# Patient Record
Sex: Female | Born: 1935 | Race: White | Hispanic: No | Marital: Married | Smoking: Never smoker
Health system: Southern US, Community
[De-identification: ages and names within clinical notes are randomized; demographics above are authoritative.]

## PROBLEM LIST (undated history)

## (undated) DIAGNOSIS — I2510 Atherosclerotic heart disease of native coronary artery without angina pectoris: Secondary | ICD-10-CM

## (undated) DIAGNOSIS — C499 Malignant neoplasm of connective and soft tissue, unspecified: Secondary | ICD-10-CM

## (undated) DIAGNOSIS — M549 Dorsalgia, unspecified: Secondary | ICD-10-CM

## (undated) DIAGNOSIS — I499 Cardiac arrhythmia, unspecified: Secondary | ICD-10-CM

## (undated) DIAGNOSIS — F419 Anxiety disorder, unspecified: Secondary | ICD-10-CM

## (undated) DIAGNOSIS — I351 Nonrheumatic aortic (valve) insufficiency: Secondary | ICD-10-CM

## (undated) DIAGNOSIS — M353 Polymyalgia rheumatica: Secondary | ICD-10-CM

## (undated) DIAGNOSIS — I671 Cerebral aneurysm, nonruptured: Secondary | ICD-10-CM

## (undated) DIAGNOSIS — M797 Fibromyalgia: Secondary | ICD-10-CM

## (undated) DIAGNOSIS — I5032 Chronic diastolic (congestive) heart failure: Secondary | ICD-10-CM

## (undated) DIAGNOSIS — F0390 Unspecified dementia without behavioral disturbance: Secondary | ICD-10-CM

## (undated) DIAGNOSIS — I480 Paroxysmal atrial fibrillation: Secondary | ICD-10-CM

## (undated) DIAGNOSIS — R42 Dizziness and giddiness: Secondary | ICD-10-CM

## (undated) DIAGNOSIS — S22000A Wedge compression fracture of unspecified thoracic vertebra, initial encounter for closed fracture: Secondary | ICD-10-CM

## (undated) DIAGNOSIS — I099 Rheumatic heart disease, unspecified: Secondary | ICD-10-CM

## (undated) DIAGNOSIS — G629 Polyneuropathy, unspecified: Secondary | ICD-10-CM

## (undated) DIAGNOSIS — I77810 Thoracic aortic ectasia: Secondary | ICD-10-CM

## (undated) DIAGNOSIS — E039 Hypothyroidism, unspecified: Secondary | ICD-10-CM

## (undated) DIAGNOSIS — R51 Headache: Secondary | ICD-10-CM

## (undated) HISTORY — PX: BLADDER SUSPENSION: SHX72

## (undated) HISTORY — DX: Polyneuropathy, unspecified: G62.9

## (undated) HISTORY — DX: Rheumatic heart disease, unspecified: I09.9

## (undated) HISTORY — PX: EYE SURGERY: SHX253

## (undated) HISTORY — DX: Cerebral aneurysm, nonruptured: I67.1

## (undated) HISTORY — DX: Hypothyroidism, unspecified: E03.9

## (undated) HISTORY — DX: Nonrheumatic aortic (valve) insufficiency: I35.1

## (undated) HISTORY — DX: Dizziness and giddiness: R42

## (undated) HISTORY — DX: Malignant neoplasm of connective and soft tissue, unspecified: C49.9

## (undated) HISTORY — DX: Thoracic aortic ectasia: I77.810

## (undated) HISTORY — PX: ABDOMINAL HYSTERECTOMY: SHX81

---

## 2000-07-26 ENCOUNTER — Encounter: Payer: Self-pay | Admitting: Obstetrics and Gynecology

## 2000-07-30 ENCOUNTER — Inpatient Hospital Stay (HOSPITAL_COMMUNITY): Admission: RE | Admit: 2000-07-30 | Discharge: 2000-08-02 | Payer: Self-pay | Admitting: Obstetrics and Gynecology

## 2000-07-30 ENCOUNTER — Encounter (INDEPENDENT_AMBULATORY_CARE_PROVIDER_SITE_OTHER): Payer: Self-pay

## 2002-03-30 ENCOUNTER — Other Ambulatory Visit: Admission: RE | Admit: 2002-03-30 | Discharge: 2002-03-30 | Payer: Self-pay | Admitting: Obstetrics and Gynecology

## 2002-04-01 ENCOUNTER — Encounter: Payer: Self-pay | Admitting: Neurology

## 2002-04-01 ENCOUNTER — Encounter: Admission: RE | Admit: 2002-04-01 | Discharge: 2002-04-01 | Payer: Self-pay | Admitting: Neurology

## 2003-04-22 ENCOUNTER — Other Ambulatory Visit: Admission: RE | Admit: 2003-04-22 | Discharge: 2003-04-22 | Payer: Self-pay | Admitting: Obstetrics and Gynecology

## 2003-06-09 ENCOUNTER — Ambulatory Visit (HOSPITAL_COMMUNITY): Admission: RE | Admit: 2003-06-09 | Discharge: 2003-06-09 | Payer: Self-pay | Admitting: Gastroenterology

## 2003-06-09 ENCOUNTER — Encounter (INDEPENDENT_AMBULATORY_CARE_PROVIDER_SITE_OTHER): Payer: Self-pay | Admitting: Specialist

## 2007-05-05 ENCOUNTER — Encounter: Admission: RE | Admit: 2007-05-05 | Discharge: 2007-08-03 | Payer: Self-pay | Admitting: Otolaryngology

## 2009-09-12 ENCOUNTER — Encounter: Admission: RE | Admit: 2009-09-12 | Discharge: 2009-09-12 | Payer: Self-pay | Admitting: Family Medicine

## 2009-11-19 HISTORY — PX: OTHER SURGICAL HISTORY: SHX169

## 2010-09-05 ENCOUNTER — Ambulatory Visit: Payer: Self-pay | Admitting: Cardiology

## 2010-11-27 ENCOUNTER — Encounter
Admission: RE | Admit: 2010-11-27 | Discharge: 2010-11-27 | Payer: Self-pay | Source: Home / Self Care | Attending: Obstetrics and Gynecology | Admitting: Obstetrics and Gynecology

## 2011-02-16 ENCOUNTER — Other Ambulatory Visit: Payer: Self-pay | Admitting: *Deleted

## 2011-02-16 MED ORDER — ALPRAZOLAM 0.5 MG PO TABS: 0.5000 mg | ORAL_TABLET | Freq: Every evening | ORAL | Status: AC | PRN

## 2011-02-16 NOTE — Telephone Encounter (Signed)
Refilled meds per fax request.  

## 2011-03-06 ENCOUNTER — Encounter: Payer: Self-pay | Admitting: *Deleted

## 2011-03-06 DIAGNOSIS — E039 Hypothyroidism, unspecified: Secondary | ICD-10-CM

## 2011-03-06 DIAGNOSIS — R002 Palpitations: Secondary | ICD-10-CM

## 2011-03-06 DIAGNOSIS — C499 Malignant neoplasm of connective and soft tissue, unspecified: Secondary | ICD-10-CM

## 2011-03-06 DIAGNOSIS — R011 Cardiac murmur, unspecified: Secondary | ICD-10-CM | POA: Insufficient documentation

## 2011-03-06 DIAGNOSIS — G609 Hereditary and idiopathic neuropathy, unspecified: Secondary | ICD-10-CM

## 2011-03-06 DIAGNOSIS — M353 Polymyalgia rheumatica: Secondary | ICD-10-CM | POA: Insufficient documentation

## 2011-03-06 DIAGNOSIS — I351 Nonrheumatic aortic (valve) insufficiency: Secondary | ICD-10-CM | POA: Insufficient documentation

## 2011-03-06 DIAGNOSIS — I77810 Thoracic aortic ectasia: Secondary | ICD-10-CM | POA: Insufficient documentation

## 2011-03-07 ENCOUNTER — Ambulatory Visit (INDEPENDENT_AMBULATORY_CARE_PROVIDER_SITE_OTHER): Payer: Medicare Other | Admitting: Cardiology

## 2011-03-07 ENCOUNTER — Encounter: Payer: Self-pay | Admitting: Cardiology

## 2011-03-07 DIAGNOSIS — G629 Polyneuropathy, unspecified: Secondary | ICD-10-CM | POA: Insufficient documentation

## 2011-03-07 DIAGNOSIS — I359 Nonrheumatic aortic valve disorder, unspecified: Secondary | ICD-10-CM

## 2011-03-07 DIAGNOSIS — R002 Palpitations: Secondary | ICD-10-CM

## 2011-03-07 DIAGNOSIS — I351 Nonrheumatic aortic (valve) insufficiency: Secondary | ICD-10-CM

## 2011-03-07 DIAGNOSIS — C499 Malignant neoplasm of connective and soft tissue, unspecified: Secondary | ICD-10-CM

## 2011-03-07 DIAGNOSIS — G609 Hereditary and idiopathic neuropathy, unspecified: Secondary | ICD-10-CM

## 2011-03-07 NOTE — Assessment & Plan Note (Signed)
The patient has a past history of palpitations she has been on atenolol 12.5 mg at bedtime she has not been expressing any recent palpitations.  She's had no dizziness or syncope.

## 2011-03-07 NOTE — Assessment & Plan Note (Signed)
Patient has a known murmur of aortic insufficiency.  Her last echocardiogram was/25/at that time showed a aortic root measuring 53 mm and she had severe aortic insufficiency.  Clinically she has not been expressing any symptoms of congestive heart failure or angina pectoris.

## 2011-03-07 NOTE — Assessment & Plan Note (Signed)
The patient has a history of idiopathic peripheral neuropathy.  Recently she has been getting some relief of her paresthesias of her feet and burning discomfort in her feet by going to her chiropractor Dr. Haynes Kerns who has been treating her with ultrasound therapy

## 2011-03-07 NOTE — Assessment & Plan Note (Signed)
Patient has a past history of sarcoma of the inner left thigh.  He underwent radiation preoperatively and then underwent surgery on December 30, 2009.  There is no evidence of any distant metastases or any local recurrence

## 2011-03-07 NOTE — Progress Notes (Signed)
Kayla Chambers Date of Birth:  January 29, 1927 [facility] [address]   [address] [place], [place]  [address] [phone]           Fax   [phone]  History of Present Illness:  this pleasant 77 year old woman is seen for a scheduled followup office visit.  She has a history of known dilated aortic root with severe aortic insufficiency she does not have any history of congestive heart failure or angina pectoris.  She continues to maintain normal activities.  She does have a past history of idiopathic peripheral neuropathy.  She also has a history of a previous large sarcoma on the inner aspect of left thigh and underwent radiation therapy to the area followed by surgery and removal of the sarcoma on December 30, 2009.  There has been no evidence of any recurrence locally or metastasis  Current Outpatient Prescriptions  Medication Sig Dispense Refill  . ALPRAZolam (XANAX) 0.5 MG tablet Take 1 tablet (0.5 mg total) by mouth at bedtime as needed for sleep.  30 tablet  5  . atenolol (TENORMIN) 25 MG tablet Take 25 mg by mouth daily.        . calcium citrate-vitamin D (CITRACAL+D) 315-200 MG-UNIT per tablet Take 1 tablet by mouth 3 (three) times daily.        . fish oil-omega-3 fatty acids 1000 MG capsule Take 1 g by mouth 2 (two) times daily.        Marland Kitchen levothyroxine (SYNTHROID, LEVOTHROID) 50 MCG tablet Take 50 mcg by mouth daily.        . multivitamin (THERAGRAN) per tablet Take 1 tablet by mouth daily.        . Thiamine HCl (VITAMIN B-1) 100 MG tablet Take 300 mg by mouth daily.        . Vitamins-Lipotropics (VIT BALANCED B-100 PO) Take 1 tablet by mouth daily.        . calcium-vitamin D (OSCAL WITH D) 500-200 MG-UNIT per tablet Take 1 tablet by mouth daily.        Marland Kitchen gabapentin (NEURONTIN) 100 MG capsule Take 100 mg by mouth as needed.        . pregabalin (LYRICA) 25 MG capsule Take 25 mg by mouth as needed.          Allergies  Allergen Reactions  . Fosamax     . Lyrica (Pregabalin)   . Neurontin (Gabapentin)   . Novocain   . Toprol Xl (Metoprolol Succinate)     Patient Active Problem List  Diagnoses  . Aortic insufficiency  . Dilated aortic root  . Sarcoma  . Heart palpitations  . Hypothyroidism  . Heart murmur  . Polymyalgia rheumatica  . Peripheral neuropathy    History  Smoking status  . Never Smoker   Smokeless tobacco  . Never Used    History  Alcohol Use No    Family History  Problem Relation Age of Onset  . Heart disease Mother   . Stroke Mother   . Arthritis Father   . Cancer Father   . Hypertension Sister     Review of Systems: Constitutional: no fever chills diaphoresis or fatigue or change in weight.  Head and neck: no hearing loss, no epistaxis, no photophobia or visual disturbance. Respiratory: No cough, shortness of breath or wheezing. Cardiovascular: No chest pain peripheral edema, palpitations. Gastrointestinal: No abdominal distention, no abdominal pain, no change in bowel habits hematochezia or melena. Genitourinary: No dysuria, no frequency,  no urgency, no nocturia. Musculoskeletal:No arthralgias, no back pain, no gait disturbance or myalgias. Neurological: No dizziness, no headaches, no numbness, no seizures, no syncope, no weakness, no tremors. Hematologic: No lymphadenopathy, no easy bruising. Psychiatric: No confusion, no hallucinations, no sleep disturbance.    Physical Exam: Filed Vitals:   03/07/11 1025  BP: 142/60  Pulse: 76    the general appearance feels a well-developed well-nourished woman in no distress.Pupils equal and reactive.   Extraocular Movements are full.  There is no scleral icterus.  The mouth and pharynx are normal.  The neck is supple.  The carotids reveal no bruits.  The jugular venous pressure is normal.  The thyroid is not enlarged.  There is no lymphadenopathy.The chest is clear to percussion and auscultation. There are no rales or rhonchi. Expansion of the chest is  symmetrical.  Heart reveals a grade 3/6 murmur of aortic insufficiency along the left lower sternal edge.The abdomen is soft and nontender. Bowel sounds are normal. The liver and spleen are not enlarged. There Are no abdominal masses. There are no bruits.  Normal extremity without phlebitis or edema pedal pulses are present.  Assessment / Plan: Any same medication.  Recheck in 6 months for followup office visit and after that consider followup echocardiogram.

## 2011-04-06 NOTE — Discharge Summary (Signed)
[facility]  Patient:    Kayla Chambers, Kayla Chambers                 MRN: [id number] Adm. Date:  [id number] Disc. Date: [id number] Attending:  Frederich Balding                           Discharge Summary  ADMITTING DIAGNOSES:  Symptomatic pelvic relaxation.  DISCHARGE DIAGNOSES:  Symptomatic pelvic relaxation.  PROCEDURE:  Total vaginal hysterectomy with anterior and posterior colporrhaphy, sacrospinous ligament suspension. Suburethral sling.  For complete history and physical, please see dictated note.  HOSPITAL COURSE:  The patient underwent the above noted surgery. Because of her valvular heart disease as noted in the H&P, she was placed on a telemetry unit postoperatively. Dr. Patty Sermons was brought in for consultation in view of some heart irregularities. She did quite well postoperatively. She had a slightly low potassium that was corrected. She was subsequently discharged home on her third postoperative day. At that time, she was afebrile, stable vital signs, abdomen soft and nontender. Bowel sounds were active. She had positive flatus. She had no active vaginal bleeding and previous vaginal pack had been removed. She was still working in terms of voiding issues which Dr. Logan Bores was managing. Her postop hemoglobin was 10.3.  Complications none. ______. The patient left the operating room in stable condition.  DISPOSITION:  Routine postop orders were given. She is to avoid heavy lifting, vaginal entrance, or driving a car. She is to watch for signs of infection, heavy vaginal bleeding, increased abdominal pain or nausea, vomiting.  Discharged home on Tylox as needed for pain. Complete a course of Augmentin. she will be followed up in the office in approximately 1 week and follow-up with Dr. Logan Bores as noted. DD:  09/06/00 TD:  09/06/00 Job: [id number]

## 2011-04-06 NOTE — Op Note (Signed)
[facility]  Patient:    Kayla Chambers, Kayla Chambers                 MRN: [id number] Proc. Date: 07/30/00 Adm. Date:  [id number] Attending:  Frederich Balding CC:         Al Decant. Janey Greaser, M.D.   Operative Report  SERVICE:  Urology.  PREOPERATIVE DIAGNOSES:  Stress urinary incontinence.  POSTOPERATIVE DIAGNOSES:  Stress urinary incontinence.  PROCEDURE:  Cystoscopy, suprapubic tube placement and pubovaginal sling.  SURGEON:  Dr. Logan Bores.  ANESTHESIA:  General.  DRAINS:  Bonanno cystocath and also a 45 French Foley catheter.  BRIEF HISTORY:  This 77 year old female has pelvic relaxation with associated vaginal vault prolapse, cystocele, rectocele as well as evidence of stress incontinence. The patient is scheduled to undergo a vaginal hysterectomy plus sacrospinalis fixation, anterior repair and posterior repair and because of her stress incontinence has requested that an incontinence procedure be performed at the same time. I have had the chance to evaluate the patient and agree that this is an appropriate option. Dr. Arelia Sneddon will complete his procedure and the patient will undergo a pubovaginal sling. She has been appraised as to the risks and benefits of the procedure including the need for postoperative catheter drainage and the possibility of residual stress incontinence, urgency incontinence or injury to the bladder or other structures. She gave full and informed consent.  DESCRIPTION OF PROCEDURE:  After successful induction of general anesthesia, the patient was placed in the dorsal lithotomy position and prepped with Betadine and draped in the usual sterile fashion. Dr. Richardean Chimera with the assistance of Dr. Rana Snare performed a vaginal hysterectomy including bilateral oophorectomy. He then repaired her cystocele, rectocele and performed a sacrospinalis fixation to complete the suspension of the vault. The patients posterior mucosa was closed but  anterior vaginal mucosa was left open in preparation for the pubovaginal sling. The patients legs were lowered somewhat in preparation for the sling. The mucosal flaps of the antral vagina were raised and Strulle scissors were used to dissect back into the space of Retzius on both sides. The sling was then constructed of a 2 x 7 piece of fascia with the ends folded and oversewn with #1 nylon. A small stab incision was made directly over the symphysis pubis. A tonsil clamp was then passed from the abdominal incision to the vaginal incision to pull up the sling. Cystoscopic examination showed that there was no suture material or sling material anywhere within the bladder and it appeared that it was well positioned at the bladder neck. On the left hand side, there was a slight tear in the mucosa that necessitated slight repositioning of the sling. This will heal without sequelae. The anterior vaginal mucosa was then closed with a running suture of 2-0 Vicryl. This has previously been trimmed by Dr. Arelia Sneddon. The patient had a Bonnano cystocath placed under direct vision and this was sutured in place with nylon suture. The sling was then elevated with what was felt to be an appropriate degree of tension. The sling was pulled off so that the mucosa would coap but was not angulated. There was 30 degrees or more of play in the cystoscope. The cystoscope was not angulated in anyway and when it passed into the bladder, the patient had no lost urine but with Crede there was prompt and straight flow of urine without the sling. Two fingers could still be placed underneath the sutures in the abdominal incision. After the sling was  completely tied down, the incisions were irrigated with antibiotic solution. The abdominal incision was then closed with 2-0 Vicryl and with surgical clips. The Bonnano had been sutured in place with nylon sutures. The patient had packing applied and a dressing was placed around  the suprapubic tube site. DD:  07/30/00 TD:  07/31/00 Job: [id number]

## 2011-04-06 NOTE — H&P (Signed)
[facility]  Patient:    Kayla Chambers, Kayla Chambers                       MRN: [id number] Adm. Date:  07/30/00 Attending:  Juluis Mire, M.D.                         History and Physical  HISTORY OF PRESENT ILLNESS:  This patient is a 77 year old gravida 5, para 4, abortus 1, married white female, who presents for surgical management of symptomatic pelvic relaxation.  The planned procedure is a total vaginal hysterectomy with anterior and posterior colporrhaphy.  Possible sacrospinous ligament suspension.  Jamison Neighbor, M.D., is planned to do a suburethral sling to follow.  In relation to the present admission, the patient has been followed in our practice since 1992 with worsening pelvic relaxation.  She has been off and on hormone replacement therapy during this time.  Due to increasing symptomatology, she eventually began the use of a pessary.  As this became mor inconvenient for the patient and the pelvic relaxation due to pressure and discomfort became more limiting from an activity standpoint, the patient has decided to proceed with surgical management.  She has been evaluated by Dr. Logan Bores, who has determined that a suburethral sling would be appropriately placed at the time of surgery.  She has been on estrogen vaginal cream for management issues.  She has had no abnormal bleeds in the postmenopausal state.  She had a recent ultrasound revealing a minimal endometrial stripe and no evidence of any uterine and/or ovarian abnormalities.  ALLERGIES:  NOVOCAINE.  MEDICATIONS:  Synthroid, Fosamax, calcium, and atenolol.  PAST MEDICAL HISTORY:  Significant in that she has had a history of rheumatic heart disease.  She is followed by Dr. Patty Sermons for both mitral regurgitation as well as some aortic insufficiency.  He does recommend SBE prophylaxis.  She has had some arrhythmias in the past but seems to be well controlled at the present time.  She also has  signs of osteoporosis on bone density and is presently on the above-noted medications, although again is off hormone replacement therapy.  She is also hypothyroid, on Synthroid.  She has most health maintenance issues followed by Chales Salmon. Abigail Miyamoto, M.D., here in town.  PAST SURGICAL HISTORY:  She has had a cyst removed from the leg but no other surgical history.  OBSTETRICAL HISTORY:  She has had four spontaneous vaginal deliveries, one miscarriage.  SOCIAL HISTORY:  No smoking or tobacco use.  FAMILY HISTORY:  Basically noncontributory.  REVIEW OF SYSTEMS:   Noncontributory.  PHYSICAL EXAMINATION:  VITAL SIGNS:  The patient is afebrile with stable vital signs.  HEENT:  Patient is normocephalic.  Pupils are equal, round and reactive to light and accommodation.  Extraocular movements were intact.  Sclerae and conjunctivae clear.  Oropharynx clear.  NECK:  Without thyromegaly.  BREASTS:  No discrete masses.  LUNGS:  Clear.  HEART:  Regular rhythm and rate.  She does have a diastolic murmur that is easily heard along the left sternal border with radiation to the apex.  ABDOMEN:  Benign.  No mass, organomegaly, or tenderness.  PELVIC:  Normal external genitalia.  Vaginal mucosa is clear.  Does have a severe cystocele, rectocele, moderate uterine descensus.  Uterus feels to be normal size and shape.  Adnexa unremarkable.  Rectovaginal exam is clear.  EXTREMITIES:  Trace edema.  NEUROLOGIC:  Grossly within normal  limits.  IMPRESSION: 1. Worsening symptomatic pelvic relaxation. 2. Valvular disease secondary to rheumatic heart disease in the past. 3. Hypothyroidism. 4. Known osteoporosis.  PLAN:  The patient will undergo the above-noted surgery.  The overall risks of the surgery have been discussed, including the risks of anesthesia.  The risk of infection.  The risk of hemorrhage that could necessitate transfusion with the risk of AIDS or hepatitis.  The risk of injury  to adjacent organs including bladder, bowel, ureters, that could require further exploratory surgery.  The risks of deep venous thrombosis and pulmonary embolus.  She will be given SBE prophylaxis.  She does understand that there is a potential for recurrent pelvic relaxation in the future despite our attempts at surgical management at the present time and that further management schemes may need to be considered.  The patient expressed an understanding of this. DD:  07/30/00 TD:  07/30/00 Job: [id number]

## 2011-04-06 NOTE — Consult Note (Signed)
[facility]  Patient:    Kayla Chambers, Kayla Chambers                 MRN: [id number] Proc. Date: 07/30/00 Adm. Date:  [id number] Attending:  Frederich Balding CC:         Thomas A. Patty Sermons, M.D.  Juluis Mire, M.D.   Consultation Report  HISTORY OF PRESENT ILLNESS:  The patient is a 77 year old female patient of Dr. Patty Sermons, who had bladder suspension and hysterectomy today.  She had bradycardia and PVCs, all in the recovery room with heart rate in the mid 40s. She had normal sinus rhythm now with rates in the 60s.  She is currently lethargic and arousable.  She has not had any chest pain or shortness of breath, and otherwise, has been stable with stable vital signs.  PAST MEDICAL HISTORY:  She has a history of aortic insufficiency on her last echo in September of 1999, with normal LV function and no significant change from 1998.  She was scheduled for a follow up echo in approximately one month. She has a history of palpitations and she has been on low dose beta blocker. She had a history of rheumatic fever at age 18.  She has had dilated aortic roots that has been stable on her last echo in 1999.  ALLERGIES:  NORVASC.  CURRENT MEDICATIONS:  Levothroid 0.05 mg, atenolol 12.5 mg a day, multivitamin, Fosamax, and calcium.  FAMILY HISTORY:  Mother died of CVA at age 25.  Father died of stomach cancer at age 82.  SOCIAL HISTORY:  She is married, former IT sales professional.  No alcohol and no smoking.  REVIEW OF SYSTEMS:  Negative per family.  PHYSICAL EXAMINATION:  GENERAL:  She is sedated.  VITAL SIGNS:  Show blood pressure of 116/58, heart rate is in the 60s, respiratory rate is 16.  SKIN:  Warm and dry and color is pale.  NECK:  Supple.  LUNGS:  Basically clear.  HEART:  Showed a regular rate and rhythm.  There is a murmur of aortic insufficiency.  It would be a grade 2 murmur.  I could appreciate no mitral click.  ABDOMEN:   Soft.  EXTREMITIES:  Show pulses present.  LABORATORY DATA:  No postoperative lab is available.  OVERALL IMPRESSION: 1. Premature ventricular contractions postoperative. 2. Known aortic insufficiency with dilated aortic root. 3. History of palpitations.  PLAN:  Telemetry, will check a 2-D echo, will check her chemistries to make sure potassium is satisfactory.  We will hold beta blockers today and probably restart them in the early postoperative phases at a low dose. DD:  07/30/00 TD:  07/31/00 Job: [id number]

## 2011-04-06 NOTE — Op Note (Signed)
NAME:  Kayla Chambers, Kayla Chambers                        ACCOUNT NO.:  [id number]   MEDICAL RECORD NO.:  [id number]                   PATIENT TYPE:  AMB   LOCATION:  ENDO                                 FACILITY:  [facility]   PHYSICIAN:  Danise Edge, M.D.                DATE OF BIRTH:  Aug 18, 1927   DATE OF PROCEDURE:  06/09/2003  DATE OF DISCHARGE:                                 OPERATIVE REPORT   PROCEDURE:  Colonoscopy with polypectomy.   PROCEDURE INDICATION:  [name] is a 77 year old female, born  May 31, 1927.  [name] is scheduled to undergo her first screening  colonoscopy with polypectomy to prevent colon cancer.  She has unexplained  chronic watery, nonbloody diarrhea associated with occasional nocturnal  diarrhea.  She also has an intermittent nocturnal pressure discomfort in her  rectum which resolves within a few minutes after walking.   ENDOSCOPIST:  Charolett Bumpers, M.D.   PREMEDICATION:  1. Versed 5 mg.  2. Demerol 50 mg.   DESCRIPTION OF PROCEDURE:  After obtaining informed consent, [name] was  placed in the left lateral decubitus position.  I administered intravenous  Demerol and intravenous Versed to achieve conscious sedation for the  procedure.  The patient's blood pressure, oxygen saturation, and cardiac  rhythm were monitored throughout the procedure and documented in the medical  record.   Anal inspection was normal.  Digital rectal exam was normal.  The Olympus  adjustable pediatric colonoscope was introduced into the rectum and easily  advanced to the cecum.  Colonic preparation for the exam today was  excellent.   RECTUM:  Normal.  In the distal rectum, there are a few scattered mucosal  hemorrhages, probably due to the colonic prep or endoscope trauma.  SIGMOID COLON AND DESCENDING COLON:  Colonic diverticulosis without  diverticulitis, without diverticular stricture formation.  SPLENIC FLEXURE:  Normal.  TRANSVERSE COLON:   Normal.  HEPATIC FLEXURE:  Normal.  ASCENDING COLON:  Normal.  CECUM AND ILEOCECAL VALVE:  A 2 mm polyps was present in the proximal cecum  adjacent to the appendiceal orifice.  The polyp was removed with the  electrocautery snare and submitted for pathological interpretation.   RANDOM COLONIC BIOPSIES:  Random colonic biopsies were taken from the right  colon and left colon and placed in one pathology bottle to be sent to rule  out microscopic-collagenous colitis.   ASSESSMENT:  1. A small 2 mm polyp was removed from the proximal cecum.  2. Diverticula are present in the left colon without diverticulitis or     diverticular stricture formation.  3. Random colonic biopsies were performed to rule out microscopic-     collagenous colitis as a possible explanation for her functional-type,     watery, nonbloody diarrhea.   RECOMMENDATIONS:  If the cecal polyps returns neoplastic pathologically, Ms.  Kayla Chambers should undergo a repeat colonoscopy in five years.  Danise Edge, M.D.    MJ/MEDQ  D:  06/09/2003  T:  06/09/2003  Job:  [id number]   cc:   Al Decant. Janey Greaser, MD  [address]  [place]  [place] [address]  Fax: [phone]   Juluis Mire, M.D.  [address] [place]  [place] [address]  Fax: [phone]   Cassell Clement, M.D.  [address], [address]  [place]  [place] [address]  Fax: [phone]

## 2011-04-06 NOTE — Op Note (Signed)
[facility]  Patient:    Kayla Chambers, Kayla Chambers                 MRN: [id number] Proc. Date: 07/30/00 Adm. Date:  [id number] Attending:  Frederich Balding                           Operative Report  PREOPERATIVE DIAGNOSES:  Symptomatic pelvic relaxation.  POSTOPERATIVE DIAGNOSES:  Symptomatic pelvic relaxation.  OPERATION PERFORMED:  Total vaginal hysterectomy with bilateral salpingo-oophorectomy, anterior and posterior colporrhaphy. Sacrospinous ligament suspension.  SURGEON:  Dr. Arelia Sneddon.  ASSISTANT:  Dr. Candice Camp.  ANESTHESIA:  General endotracheal.  ESTIMATED BLOOD LOSS:  200 cc.  PACKS:  Vaginal pack.  DRAINS:  Suprapubic catheter.  COMPLICATIONS:  None.  INTRAOPERATIVE BLOOD REPLACED:  None.  INDICATIONS FOR PROCEDURE:  As dictated in the history and physical.  DESCRIPTION OF PROCEDURE:  The patient was taken to the OR and placed in the supine position. After a satisfactory level of general endotracheal anesthesia was obtained, the patient was placed in the dorsal lithotomy position using the Allen stirrups. The abdomen, perineum and vagina were prepped out with Betadine and the patient was draped out in a sterile field. Exam under anesthesia revealed marked uterine descensus with associated severe cystocele and rectocele. The uterus was felt to be of normal size and shape. There was no adnexal enlargement. A weighted speculum was placed in the vaginal vault. The cervix was grasped with a Christella Hartigan tenaculum. The cul-de-sac was entered sharply. Both uterosacral ligaments were clamped, cut and suture ligated with #0 Vicryl with these being held. The reflection of the vaginal mucosa anteriorly was then incised and the bladder was dissected superiorly. Paracervical tissue was then clamped, cut and suture ligated with #0 Vicryl. Next, the vesicouterine space was identified and entered sharply and retractors put in place to retract the  bladder superiorly. Using the clamp, cut and tie technique with suture ligatures of #0 Vicryl, the parametrium was serially separated from the side of the uterus up to the utero-ovarian pedicles. The utero-ovarian pedicles were then clamped and cut and the uterus passed off the operative field. The right ovary had an apparent simple cyst, the left ovary was small and atrophic. The decision was to remove the ovaries. First the left ovary was grasped with a Babcock tenaculum along with the tube. The ovarian pedicle was then clamped, cut and the ovaries passed off the operative field The pedicle was secured with 2 free ties of #0 Vicryl and a suture ligature of #0 Vicryl. Next, the right ovary was grasped with a Babcock tenaculum. The ovarian pedicle was identified, clamped, cut and the ovaries passed off the operative field. The pedicle was secured with 2 free ties of #0 Vicryl and suture ligatures of #0 Vicryl. With this we had excellent hemostasis. The posterior vaginal cuff was then run with a running locking suture of #0 Vicryl.  Attention was now turned to the anterior repair. The anterior vaginal mucosa was incised in the midline up to approximately a centimeter below the urethral orifice. The pubocervical vaginal fascia was then sharply dissected off the overlying vaginal mucosa. The cystocele was then reduced with interrupted sutures of 2-0 Vicryl. The vaginal edges were then trimmed. We then started at the posterior part of the cuff. We did a uterosacral plication stitch of #0 Vicryl. The uterosacral ligaments were then brought together and tied. Then we closed the vaginal cuff with  interrupted figure-of-eights of 2-0 Vicryl. This was continued until the vaginal cuff was completely closed and we extended halfway up the anterior vaginal mucosa. We left approximately a 2 cm area open for Dr. Logan Bores to do his pubovaginal sling.  Attention was now turned to the posterior repair. A "V"  incision was made over the perineal body and the skin was dissected to the vaginal orifice and incised. The posterior vaginal mucosa was then undermined in the midline and incised. The underlying perirectal fascia was dissected from the overlying vaginal mucosa. Next, we were able to dissect the colon to the left. We placed a retractor to hold it over and a second retractor was placed then to retract the vaginal mucosa anteriorly. The sacrospinous ligament was easily identified as it was very prominent. A #0 Prolene was then placed through the uterosacral ligament next to the coccyx using the shoot suture passer. It was then secured to the vaginal apex in a helical fashion using a free Mayo needle. This was then held. Next, the rectocele was corrected with interrupted sutures of 2-0 Vicryl. The vaginal edges posterior were trimmed and we began reapproximating the vaginal mucosa midline with interrupted figure-of-eights of 2-0 Vicryl. Once we were halfway up the vaginal vault, we tied the sacrospinous ligament suture and this brought good elevation to the vagina. This was then cut and the remaining vaginal mucosa reapproximated in the midline with interrupted figure-of-eights of 2-0 Vicryl. The perineal body was rebuilt with 2-0 Vicryl and the skin over the perineal body was closed with interrupted sutures of 2-0 Vicryl. Sponge, needle and instrument counts were correct by circulating nurse x 2 at this point. A catheter was placed in the bladder and we retrieved adequate amounts of clear urine. The patient was tolerating the procedure well. Dr. Logan Bores came at this point to complete the pubovaginal sling. DD:  07/30/00 TD:  07/31/00 Job: [id number]

## 2011-08-04 ENCOUNTER — Emergency Department (HOSPITAL_COMMUNITY): Payer: Medicare Other

## 2011-08-04 ENCOUNTER — Emergency Department (HOSPITAL_COMMUNITY)
Admission: EM | Admit: 2011-08-04 | Discharge: 2011-08-05 | Disposition: A | Discharge status: Home / Self Care | Payer: Medicare Other | Attending: Emergency Medicine | Admitting: Emergency Medicine

## 2011-08-04 DIAGNOSIS — Z79899 Other long term (current) drug therapy: Secondary | ICD-10-CM | POA: Insufficient documentation

## 2011-08-04 DIAGNOSIS — R42 Dizziness and giddiness: Secondary | ICD-10-CM | POA: Insufficient documentation

## 2011-08-04 DIAGNOSIS — N390 Urinary tract infection, site not specified: Secondary | ICD-10-CM | POA: Insufficient documentation

## 2011-08-04 LAB — URINALYSIS, ROUTINE W REFLEX MICROSCOPIC
Glucose, UA: NEGATIVE mg/dL
Ketones, ur: 15 mg/dL — AB
Protein, ur: NEGATIVE mg/dL

## 2011-08-04 LAB — TROPONIN I: Troponin I: <0.3 ng/mL (ref <0.30)

## 2011-08-04 LAB — COMPREHENSIVE METABOLIC PANEL
AST: 22 U/L (ref 0–37)
Albumin: 3.9 g/dL (ref 3.5–5.2)
Alkaline Phosphatase: 64 U/L (ref 39–117)
Chloride: 101 mEq/L (ref 96–112)
Potassium: 3.6 mEq/L (ref 3.5–5.1)
Total Bilirubin: 0.4 mg/dL (ref 0.3–1.2)

## 2011-08-04 LAB — POCT I-STAT, CHEM 8
Calcium, Ion: 1.2 mmol/L (ref 1.12–1.32)
Chloride: 104 mEq/L (ref 96–112)
Glucose, Bld: 106 mg/dL — ABNORMAL HIGH (ref 70–99)
HCT: 39 % (ref 36.0–46.0)
TCO2: 26 mmol/L (ref 0–100)

## 2011-08-04 LAB — DIFFERENTIAL
Basophils Absolute: 0 10*3/uL (ref 0.0–0.1)
Eosinophils Relative: 3 % (ref 0–5)
Lymphocytes Relative: 35 % (ref 12–46)
Monocytes Absolute: 0.4 10*3/uL (ref 0.1–1.0)

## 2011-08-04 LAB — CK TOTAL AND CKMB (NOT AT ARMC): Relative Index: 3.5 — ABNORMAL HIGH (ref 0.0–2.5)

## 2011-08-04 LAB — CBC
HCT: 37.5 % (ref 36.0–46.0)
MCHC: 34.4 g/dL (ref 30.0–36.0)
MCV: 92.4 fL (ref 78.0–100.0)
RDW: 14 % (ref 11.5–15.5)

## 2011-08-04 LAB — URINE MICROSCOPIC-ADD ON

## 2011-08-04 LAB — PROTIME-INR: INR: 0.96 (ref 0.00–1.49)

## 2011-08-05 NOTE — Consult Note (Signed)
NAMECARLENE, Kayla Chambers NO.:  [id number]  MEDICAL RECORD NO.:  [id number]  LOCATION:  [facility]                         FACILITY:  [facility]  PHYSICIAN:  Kipp Laurence, MD DATE OF BIRTH:  10-May-1927  DATE OF CONSULTATION:  08/04/2011 DATE OF DISCHARGE:                                CONSULTATION   TIME OF CONSULTATION:  8:26 p.m.  CONSULTING ATTENDING:  Carleene Cooper, MD  CHIEF COMPLAINT:  "I feel dizzy."  HISTORY OF PRESENT ILLNESS:  [name] is an 77 year old white woman with history of peripheral vertigo, intracranial aneurysm, mitral regurgitation and aortic insufficiency who presents to emergency department this evening with acute onset of lightheadedness.  The patient's husband and daughter report that she had been fairly active today and had seemed in her baseline state of health.  This evening at 7:45 p.m., she was in her kitchen and bent over to pick something up. When she stood up, she became lightheaded and complained that she was afraid she would pass out.  Family noticed that she appeared very pale and diaphoretic.  Her daughter and husband helped her sit down.  At that time, her husband took her blood pressure.  At baseline, he reports the patient is mildly hypotensive and states her normal systolic blood pressure runs from 90-110.  When he evaluated her blood pressure, he found it to be in the 190s systolic.  EMS was called.  Upon their arrival, they noted no neurologic deficits.  Family also states that they tested her strength and speech while waiting for EMS and noted no focal deficits at that time as well.  Upon EMS's arrival, they checked her blood pressure and found it to be 187/82.  The patient continued to complain of lightheadedness during their evaluation.  Upon arrival to the emergency department, the patient was awake, alert, and articulate. She reported that she was mildly lightheaded with sitting up and felt better when she was  lying down.  CT scan of the head was obtained as per code stroke protocol; this was unremarkable.  The patient denies any other neurologic symptoms.  She denies any recent medication changes and states she has no history of stroke or TIA.  She does have a history of a cerebral aneurysm, location unknown, and states she is followed in [place] for this with frequent MRIs. It has been stable since the 1970s.  PAST MEDICAL AND SURGICAL HISTORY: 1. Peripheral vertigo. 2. Peripheral neuropathy of unknown etiology. 3. 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 4. Intracranial aneurysm, location unknown. 5. History of hysterectomy in 2001. 6. History of rheumatic heart disease. 7. Mitral regurgitation. 8. Aortic insufficiency. 9. Hypothyroidism. 10.Osteoporosis.  MEDICATIONS: 1. Levothyroxine 75 mcg daily. 2. Atenolol 25 mg 1/2 tablet nightly. 3. Multivitamin daily. 4. Fish oil 2 capsules daily. 5. Calcium daily. 6. D3 daily. 7. Vitamin E daily. 8. Xanax as needed. 9. Aspirin 81 mg daily.  ALLERGIES:  NORVASC.  FAMILY HISTORY:  Mother has a history of strokes.  The patient's father died of cancer.  SOCIAL HISTORY:  The patient lives at home with her husband.  She is retired and very active at  baseline.  She is a former IT sales professional.  She denies any history of tobacco, alcohol, or drug use.  REVIEW OF SYSTEMS:  A complete 10-system review of systems was obtained and is negative except as noted in the HPI.  PHYSICAL EXAMINATION:  VITAL SIGNS:  Blood pressure 187/81, heart rate 95, respiratory rate 14, O2 saturation 99%. PULMONARY:  Clear to auscultation bilaterally. ABDOMEN:  Soft, nontender, nondistended. CARDIOVASCULAR:  Grade II systolic murmur heard best in the left upper sternal border.  No carotid bruits auscultated. NEUROLOGIC:  Mental Status:  The patient is alert and oriented x3. Speech is clear.  Language is fluent.  Memory is intact  to recent and remote events.  Attention and concentration are normal.  Fund of knowledge is intact.  Cranial Nerves:  II, visual fields intact to confrontation, funduscopic exam is unremarkable.  III, IV, VI; extraocular movements were full.  Pupils are equally round and reactive to light and accommodation.  V, facial sensation is intact bilaterally. No weakness in masticatory muscles.  VII, no facial weakness or asymmetry.  VIII, auditory acuity is grossly normal bilaterally.  IX, X; uvula is midline and palate elevates symmetrically.  XI, 5/5 strength in bilateral sternocleidomastoids and trapezius.  XII, tongue is midline, does not deviate.  Motor Exam:  5/5 strength throughout.  No pronation or drift.  Sensory Exam:  Decreased sensation to vibration and proprioception in her bilateral legs in a length-dependent pattern. Reflexes:  Absent ankle jerks bilaterally, 1+ deep tendon reflexes otherwise. Plantar responses are downgoing bilaterally.  Coordination: Intact finger-nose-finger, heel-to-shin, and rapid alternating movements.  Gait:  Deferred due to dizziness.  NIH STROKE SCALE SCORE:  Zero.  TESTING RESULTS:  Basic metabolic panel, CBC, and coags are unremarkable.  EKG reveals sinus rhythm.  IMAGING:  CT head, mild atrophy with some small vessel disease.  No acute changes noted.  ASSESSMENT:  An 77 year old white woman with history of cerebral aneurysm, peripheral vertigo, and peripheral neuropathy who presents with acute onset of lightheadedness in the setting of hypertensive urgency without focal neurologic deficits and with a negative CT of the head.  Given these findings, stroke is very unlikely in this setting. Suspect her symptoms are more likely secondary to hypertensive urgency.  PLAN: 1. No further neurologic evaluation recommended at this time. 2. Recommend the patient followup with her outpatient neurologist as     needed. 3. Further evaluation and disposition  as per the ER.  Thank you very much for this consultation.  If you have any further questions or concerns, please let us know.          ______________________________ Kipp Laurence, MD     ES/MEDQ  D:  08/04/2011  T:  08/04/2011  Job:  [id number]  Electronically Signed by Kipp Laurence MD on 08/05/2011 05:57:46 AM

## 2011-08-06 ENCOUNTER — Telehealth: Payer: Self-pay | Admitting: Cardiology

## 2011-08-06 NOTE — Telephone Encounter (Signed)
PT CALLING RE GOING TO ER SAT NIGHT, FOR DIZZINESS, SYNCOPE, SWEATING, DENIES ANY SYMPTOMS TODAY, APPT DUE IN NOV, THINKS SHE NEEDS EARLIER APPT

## 2011-08-06 NOTE — Telephone Encounter (Signed)
Going out of town tomorrow, scheduled appointment for Thursday with Lawson Fiscal

## 2011-08-07 LAB — URINE CULTURE: Culture  Setup Time: 201209161116

## 2011-08-09 ENCOUNTER — Ambulatory Visit: Payer: Medicare Other | Admitting: Nurse Practitioner

## 2011-08-09 ENCOUNTER — Encounter: Payer: Self-pay | Admitting: Nurse Practitioner

## 2011-08-09 ENCOUNTER — Encounter: Payer: Self-pay | Admitting: *Deleted

## 2011-08-09 ENCOUNTER — Encounter (INDEPENDENT_AMBULATORY_CARE_PROVIDER_SITE_OTHER): Payer: Medicare Other | Admitting: *Deleted

## 2011-08-09 VITALS — BP 132/58 | HR 66 | Ht 65.0 in | Wt 126.8 lb

## 2011-08-09 DIAGNOSIS — I351 Nonrheumatic aortic (valve) insufficiency: Secondary | ICD-10-CM

## 2011-08-09 DIAGNOSIS — I359 Nonrheumatic aortic valve disorder, unspecified: Secondary | ICD-10-CM

## 2011-08-09 DIAGNOSIS — R42 Dizziness and giddiness: Secondary | ICD-10-CM | POA: Insufficient documentation

## 2011-08-09 NOTE — Assessment & Plan Note (Signed)
Her last echo was in April of 2011. We will update. She may be nearing time for intervention.

## 2011-08-09 NOTE — Progress Notes (Unsigned)
This encounter was created in error - please disregard.

## 2011-08-09 NOTE — Progress Notes (Signed)
Agree with plan.  No MRI yet

## 2011-08-09 NOTE — Patient Instructions (Addendum)
We are going to put a monitor to look at your heart rhythm for the next month. We are going to update your ultrasound of your heart to look at your valves Finish your antibiotic We will see you back in a month Continue to check your blood pressure at home Call for any problems. 

## 2011-08-09 NOTE — Assessment & Plan Note (Signed)
Last echo was in April of 2011. We will update. She may be nearing time for intervention.

## 2011-08-09 NOTE — Assessment & Plan Note (Addendum)
She has known AI with markedly dilated aortic root. She does have a history of vertigo but says this was different. We will place an event monitor and update her echo. She is to finish her antibiotic for her UTI. She will continue to monitor her blood pressure at home. It is ok here today. She says that she no longer has follow up on her cerebral aneurysm. She may need MRI of the head. She does have a history of sarcoma.  Further disposition to follow. Patient is agreeable to this plan and will call if any problems develop in the interim.

## 2011-08-09 NOTE — Progress Notes (Unsigned)
Jake Michaelis Date of Birth: [birth year]   History of Present Illness: Ms. Pogosyan is seen today for a work in visit. She is seen for Dr. Patty Sermons. She was in the ER just a couple of days ago after having a presyncopal spell. She had been feeling great. She had eaten. She was in her kitchen and bent over to pick something up. When she stood up, she became very presyncopal and felt "sick". Not really nauseous but felt very poorly. She was very pale. She was short of breath. Blood pressure was markedly elevated which was unusual for her. She said she had some type of "arrhythmia". Her EKG showed some PVC's.  Family called EMS and she was taken to the ER. CT of the head was negative for acute event. Lab showed a UTI and she is finishing a 5 day course of antibiotics. She has had no recurrence so far and feels back to baseline. No complaints of chest pain.   Current Outpatient Prescriptions on File Prior to Visit  Medication Sig Dispense Refill  . atenolol (TENORMIN) 25 MG tablet Take 12.5 mg by mouth daily.       . calcium citrate-vitamin D (CITRACAL+D) 315-200 MG-UNIT per tablet Take 1 tablet by mouth 3 (three) times daily.        . fish oil-omega-3 fatty acids 1000 MG capsule Take 1 g by mouth 2 (two) times daily.        . multivitamin (THERAGRAN) per tablet Take 1 tablet by mouth daily.        . Thiamine HCl (VITAMIN B-1) 100 MG tablet Take 300 mg by mouth daily.        Marland Kitchen DISCONTD: levothyroxine (SYNTHROID, LEVOTHROID) 50 MCG tablet Take 50 mcg by mouth daily.          Allergies  Allergen Reactions  . Fosamax   . Lyrica (Pregabalin)   . Neurontin (Gabapentin)   . Novocain   . Toprol Xl (Metoprolol Succinate)     Past Medical History  Diagnosis Date  . Hypothyroidism   . Heart palpitations   . Sarcoma     left lower extremity s/p resection in Feb 2012  . Dilated aortic root     Marked dilatation of aortic root per echo in 2011 with severe AI, mild to moderate MR, moderate TR    . Aortic insufficiency     severe per echo in 2011  . Polymyalgia rheumatica   . Vertigo   . Peripheral neuropathy   . Intracranial aneurysm     location unknown, [facility] by [facility]  . Osteoporosis   . Rheumatic heart disease     History reviewed. No pertinent past surgical history.  History  Smoking status  . Never Smoker   Smokeless tobacco  . Never Used    History  Alcohol Use No    Family History  Problem Relation Age of Onset  . Heart disease Mother   . Stroke Mother   . Arthritis Father   . Cancer Father   . Hypertension Sister     Review of Systems: The review of systems is positive for chronic neuropathy. She will have some occasional vertigo but states this episode was very different. No chest pain. She remains active.  All other systems were reviewed and are negative.  Physical Exam: BP 132/58  Pulse 66  Ht 5\' 5"  (1.651 m)  Wt 126 lb 12.8 oz (57.516 kg)  BMI 21.10 kg/m2 Patient is very pleasant and  in no acute distress. She looks younger than her stated age. Skin is warm and dry. Color is normal.  HEENT is unremarkable. Normocephalic/atraumatic. PERRL. Sclera are nonicteric. Neck is supple. No masses. No JVD. Lungs are clear. Cardiac exam shows a regular rate and rhythm. She has a 3/6 murmur of AI. Abdomen is soft. Extremities are without edema. Gait and ROM are intact. No gross neurologic deficits noted.   LABORATORY DATA:   Assessment / Plan:

## 2011-08-09 NOTE — Assessment & Plan Note (Signed)
She has known AI with markedly dilated aortic root. She does have a history of vertigo, but says this was different. We will place an event monitor and update her echo. She is to finish her antibiotic for her UTI. She will continue to monitor her blood pressure at home. It is ok here today. She says that she no longer has follow up on her cerebral aneurysm. She may need MRI of the head as well. She does have a history of sarcoma. Further disposition to follow. Patient is agreeable to this plan and will call if any problems develop in the interim.

## 2011-08-09 NOTE — Progress Notes (Signed)
Ago with plan.  No MRI yet.

## 2011-08-09 NOTE — Patient Instructions (Signed)
We are going to put a monitor to look at your heart rhythm for the next month. We are going to update your ultrasound of your heart to look at your valves Finish your antibiotic We will see you back in a month Continue to check your blood pressure at home Call for any problems.

## 2011-08-09 NOTE — Progress Notes (Signed)
Jake Michaelis Date of Birth: 11/03/1927   History of Present Illness: Kayla Chambers is seen today for a work in visit. She is seen for Dr. Patty Sermons. She was in the Er just a couple of days ago after having a presyncopal spell. She had been feeling great prior to the episode. She had eaten. She was in her kitchen and bent over to pick something up. When she stood up, she became very presyncopal and felt "sick". Not really nauseous but felt very poorly. She was very pale. She was short of breath. Family called EMS and she was taken to the ER. CT of the head was negative for acute event. Lab showed a UTI and she is finishing a 5 day course of antibiotics. She has had no recurrence so far and feels back to her baseline. No complaints of chest pain.   Current Outpatient Prescriptions on File Prior to Visit  Medication Sig Dispense Refill  . atenolol (TENORMIN) 25 MG tablet Take 12.5 mg by mouth daily.       . calcium citrate-vitamin D (CITRACAL+D) 315-200 MG-UNIT per tablet Take 1 tablet by mouth 3 (three) times daily.        . fish oil-omega-3 fatty acids 1000 MG capsule Take 1 g by mouth 2 (two) times daily.        Marland Kitchen levothyroxine (SYNTHROID, LEVOTHROID) 75 MCG tablet Take 75 mcg by mouth daily.        . multivitamin (THERAGRAN) per tablet Take 1 tablet by mouth daily.        . Thiamine HCl (VITAMIN B-1) 100 MG tablet Take 300 mg by mouth daily.        Marland Kitchen DISCONTD: levothyroxine (SYNTHROID, LEVOTHROID) 50 MCG tablet Take 50 mcg by mouth daily.          Allergies  Allergen Reactions  . Fosamax   . Lyrica (Pregabalin)   . Neurontin (Gabapentin)   . Novocain   . Toprol Xl (Metoprolol Succinate)     Past Medical History  Diagnosis Date  . Hypothyroidism   . Heart palpitations   . 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 Dilated aortic root     Marked dilatation of aortic root per echo in 2011 with severe AI, mild to moderate MR, moderate TR  . Aortic  insufficiency     severe per echo in 2011  . Polymyalgia rheumatica   . Vertigo   . Peripheral neuropathy   . Intracranial aneurysm     in late 1970's  . Osteoporosis   . Rheumatic heart disease     Past Surgical History  Procedure Date  . Sarcoma resection 2011    History  Smoking status  . Never Smoker   Smokeless tobacco  . Never Used    History  Alcohol Use No    Family History  Problem Relation Age of Onset  . Heart disease Mother   . Stroke Mother   . Arthritis Father   . Cancer Father   . Hypertension Sister     Review of Systems: The review of systems is positive for chronic neuropathy. She will have some occasional vertigo but states this episode was very different. No chest pain. She remains active.  All other systems were reviewed and are negative.  Physical Exam: BP 120/68  Pulse 66  Resp 20  Ht 5\' 5"  (1.651 m)  Wt 126 lb (57.153 kg)  BMI 20.97  kg/m2 Patient is very pleasant and in no acute distress. She looks younger than her stated age. Skin is warm and dry. Color is normal.  HEENT is unremarkable. Normocephalic/atraumatic. PERRL. Sclera are nonicteric. Neck is supple. No masses. No JVD. Lungs are clear. Cardiac exam shows a regular rate and rhythm. She has a 3/6 murmur of AI. Abdomen is soft. Extremities are without edema. Gait and ROM are intact. No gross neurologic deficits noted.    LABORATORY DATA:   Assessment / Plan:

## 2011-08-10 ENCOUNTER — Ambulatory Visit (HOSPITAL_COMMUNITY): Payer: Medicare Other | Attending: Family Medicine | Admitting: Radiology

## 2011-08-10 DIAGNOSIS — I319 Disease of pericardium, unspecified: Secondary | ICD-10-CM | POA: Insufficient documentation

## 2011-08-10 DIAGNOSIS — I080 Rheumatic disorders of both mitral and aortic valves: Secondary | ICD-10-CM | POA: Insufficient documentation

## 2011-08-10 DIAGNOSIS — R42 Dizziness and giddiness: Secondary | ICD-10-CM

## 2011-08-10 DIAGNOSIS — I359 Nonrheumatic aortic valve disorder, unspecified: Secondary | ICD-10-CM

## 2011-08-10 DIAGNOSIS — I351 Nonrheumatic aortic (valve) insufficiency: Secondary | ICD-10-CM

## 2011-08-10 DIAGNOSIS — I079 Rheumatic tricuspid valve disease, unspecified: Secondary | ICD-10-CM | POA: Insufficient documentation

## 2011-08-14 ENCOUNTER — Telehealth: Payer: Self-pay | Admitting: *Deleted

## 2011-08-14 NOTE — Progress Notes (Signed)
Advised patient

## 2011-08-14 NOTE — Telephone Encounter (Signed)
Message copied by Burnell Blanks on Tue Aug 14, 2011  9:48 AM ------      Message from: Cassell Clement      Created: Mon Aug 13, 2011  9:54 AM       Please report.  The echocardiogram is about the same as the last echo done on 03/13/10.  The aortic root diameter is unchanged.  The aortic insufficiency remains severe.  Continue on same medication.  Check at regular visit.

## 2011-08-14 NOTE — Telephone Encounter (Signed)
Advised of echo results, no need for MRI at this time per  Dr. Patty Sermons

## 2011-08-17 ENCOUNTER — Encounter: Payer: Self-pay | Admitting: Nurse Practitioner

## 2011-08-22 ENCOUNTER — Telehealth: Payer: Self-pay | Admitting: Nurse Practitioner

## 2011-08-22 NOTE — Telephone Encounter (Signed)
Noted. Please let Dr. Patty Sermons know.

## 2011-08-22 NOTE — Telephone Encounter (Signed)
Pt and husband call today stating that the heart monitor pt has been wearing for the past 2 weeks has become a "nusance and increasinly cumbersome".  According to pt it is interfering greatly with her daily activities. Also to note that in changing the electrodes frequently, they have noticed red areas that are "itchy". She states " it feels like a demon following me around".   Pt has not experienced any further episodes and wishes to remove monitor. Pt will remove monitor and mail it back.  Mylo Red RN

## 2011-08-22 NOTE — Telephone Encounter (Signed)
Pt husband calling wanting to speak w/ someone regarding pt heart monitor.Pt husband calling stating that pt is having difficulty heart monitor. The attachments are causing a skin reaction and it is a big problem. Please return pt call to discuss further.

## 2011-08-22 NOTE — Telephone Encounter (Signed)
Agree with stopping monitor.

## 2011-09-12 ENCOUNTER — Ambulatory Visit (INDEPENDENT_AMBULATORY_CARE_PROVIDER_SITE_OTHER): Payer: Medicare Other | Admitting: Nurse Practitioner

## 2011-09-12 ENCOUNTER — Encounter: Payer: Self-pay | Admitting: Nurse Practitioner

## 2011-09-12 VITALS — BP 150/60 | HR 70 | Ht 65.0 in | Wt 128.1 lb

## 2011-09-12 DIAGNOSIS — I351 Nonrheumatic aortic (valve) insufficiency: Secondary | ICD-10-CM

## 2011-09-12 DIAGNOSIS — R030 Elevated blood-pressure reading, without diagnosis of hypertension: Secondary | ICD-10-CM

## 2011-09-12 DIAGNOSIS — IMO0001 Reserved for inherently not codable concepts without codable children: Secondary | ICD-10-CM

## 2011-09-12 DIAGNOSIS — I359 Nonrheumatic aortic valve disorder, unspecified: Secondary | ICD-10-CM

## 2011-09-12 DIAGNOSIS — R42 Dizziness and giddiness: Secondary | ICD-10-CM

## 2011-09-12 NOTE — Assessment & Plan Note (Signed)
She is willing to monitor her blood pressure at home. She says it has been good. She will monitor and let us know if she has consistent readings above 140/90. For now, I have left her on her current regimen. She is not wanting additional medicine at this time.

## 2011-09-12 NOTE — Assessment & Plan Note (Signed)
EF remains normal. Echo appears unchanged per Dr. Patty Sermons.

## 2011-09-12 NOTE — Assessment & Plan Note (Signed)
She has had no further episodes. Will hold on MRI for now. She was not able to keep the event monitor on for the full 30 days. She is not having any follow up of her cerebral aneurysm. I think if she has recurrence she will need MRI scanning. We will see her back in 4 months. Patient is agreeable to this plan and will call if any problems develop in the interim.

## 2011-09-12 NOTE — Patient Instructions (Addendum)
Keep a check of your blood pressure at home. Call us if it stays above 140/90 consistently.   Stay on your current medicines.  We will see you back in 4 months.   Call for any problems.

## 2011-09-12 NOTE — Progress Notes (Signed)
Kayla Chambers Date of Birth: 1926/11/22 Medical Record #[id number]  History of Present Illness: [name] is seen back today for a one month check. She is seen for Dr. Patty Sermons. I had seen her one month ago after having an ER visit for presyncope. Her family was concerned that she may have had a stroke. Her evaluation was negative. CT of the head was negative. She was treated for a UTI. We did an echo. She still has severe AI with dilated aortic root. Study was reviewed by Dr. Patty Sermons and felt to be unchanged. She has had no recurrence of symptoms. We did place an event monitor but she was only able to wear it for 2 weeks. She says the electrodes broke her skin out. Fortunately, she has had no other spells. She does have a recurrent UTI and is in the process of getting back on antibiotics per her PCP.   Current Outpatient Prescriptions on File Prior to Visit  Medication Sig Dispense Refill  . atenolol (TENORMIN) 25 MG tablet Take 12.5 mg by mouth daily.       . calcium citrate-vitamin D (CITRACAL+D) 315-200 MG-UNIT per tablet Take 1 tablet by mouth 3 (three) times daily.        . fish oil-omega-3 fatty acids 1000 MG capsule Take 1 g by mouth 2 (two) times daily.        Marland Kitchen levothyroxine (SYNTHROID, LEVOTHROID) 75 MCG tablet Take 75 mcg by mouth daily.        . multivitamin (THERAGRAN) per tablet Take 1 tablet by mouth daily.        . Thiamine HCl (VITAMIN B-1) 100 MG tablet Take 300 mg by mouth daily.          Allergies  Allergen Reactions  . Fosamax   . Lyrica (Pregabalin)   . Neurontin (Gabapentin)   . Novocain   . Toprol Xl (Metoprolol Succinate)     Past Medical History  Diagnosis Date  . Hypothyroidism   . Heart palpitations   . 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 Dilated aortic root     Marked dilatation of aortic root per echo in 2011 with severe AI, mild to moderate MR, moderate TR  . Aortic insufficiency     severe per echo  in 2012  . Polymyalgia rheumatica   . Vertigo   . Peripheral neuropathy   . Intracranial aneurysm     in late 1970's  . Osteoporosis   . Rheumatic heart disease     Past Surgical History  Procedure Date  . Sarcoma resection 2011    History  Smoking status  . Never Smoker   Smokeless tobacco  . Never Used    History  Alcohol Use No    Family History  Problem Relation Age of Onset  . Heart disease Mother   . Stroke Mother   . Arthritis Father   . Cancer Father   . Hypertension Sister     Review of Systems: The review of systems is positive for recurrent UTI.  All other systems were reviewed and are negative.  Physical Exam: BP 150/60  Pulse 70  Ht 5\' 5"  (1.651 m)  Wt 128 lb 1.9 oz (58.115 kg)  BMI 21.32 kg/m2 Patient is very pleasant and in no acute distress. Skin is warm and dry. Color is normal.  HEENT is unremarkable. Normocephalic/atraumatic. PERRL. Sclera are nonicteric. Neck is supple. No masses.  No JVD. Lungs are clear. Cardiac exam shows a regular rate and rhythm. She has a loud 3/6 murmur of AI.  Abdomen is soft. Extremities are without edema. Gait and ROM are intact. No gross neurologic deficits noted.  LABORATORY DATA: ECHO shows normal EF, severe AI, dilated aorta, and mild MR. Felt to be unchanged from prior study of 2011 per Dr. Patty Sermons.   Assessment / Plan:

## 2011-09-25 ENCOUNTER — Telehealth: Payer: Self-pay | Admitting: *Deleted

## 2011-09-25 NOTE — Telephone Encounter (Signed)
Advised of monitor results-PAC's per  Dr. Patty Sermons

## 2011-10-10 ENCOUNTER — Other Ambulatory Visit: Payer: Self-pay | Admitting: Dermatology

## 2011-10-15 ENCOUNTER — Other Ambulatory Visit: Payer: Self-pay | Admitting: Cardiology

## 2011-10-15 ENCOUNTER — Ambulatory Visit: Payer: Medicare Other | Admitting: Cardiology

## 2011-10-15 DIAGNOSIS — G479 Sleep disorder, unspecified: Secondary | ICD-10-CM

## 2011-10-15 MED ORDER — ALPRAZOLAM 0.5 MG PO TABS: 0.5000 mg | ORAL_TABLET | Freq: Every evening | ORAL | Status: AC | PRN

## 2011-10-15 NOTE — Telephone Encounter (Signed)
Called to pharmacy 

## 2011-10-15 NOTE — Telephone Encounter (Signed)
New Problem:    Kayla Chambers has shown up to pick up her alprazolam .5mg  but it was not there. They claim that they sent a request last Wednesday/Thursday. Please call back.

## 2011-10-15 NOTE — Telephone Encounter (Signed)
Yes okay to refill alprazolam

## 2011-10-22 ENCOUNTER — Encounter (HOSPITAL_COMMUNITY): Payer: Self-pay | Admitting: *Deleted

## 2011-10-25 ENCOUNTER — Ambulatory Visit (HOSPITAL_COMMUNITY)
Admission: RE | Admit: 2011-10-25 | Discharge: 2011-10-25 | Disposition: A | Discharge status: Home / Self Care | Payer: Medicare Other | Source: Ambulatory Visit | Attending: Gastroenterology | Admitting: Gastroenterology

## 2011-10-25 ENCOUNTER — Encounter (HOSPITAL_COMMUNITY): Payer: Self-pay | Admitting: *Deleted

## 2011-10-25 ENCOUNTER — Other Ambulatory Visit: Payer: Self-pay | Admitting: Gastroenterology

## 2011-10-25 ENCOUNTER — Encounter (HOSPITAL_COMMUNITY)
Admission: RE | Disposition: A | Discharge status: Home / Self Care | Payer: Self-pay | Source: Ambulatory Visit | Attending: Gastroenterology

## 2011-10-25 DIAGNOSIS — R197 Diarrhea, unspecified: Secondary | ICD-10-CM | POA: Insufficient documentation

## 2011-10-25 DIAGNOSIS — Z860100 Personal history of colon polyps, unspecified: Secondary | ICD-10-CM | POA: Insufficient documentation

## 2011-10-25 DIAGNOSIS — K5730 Diverticulosis of large intestine without perforation or abscess without bleeding: Secondary | ICD-10-CM | POA: Insufficient documentation

## 2011-10-25 DIAGNOSIS — K921 Melena: Secondary | ICD-10-CM | POA: Insufficient documentation

## 2011-10-25 HISTORY — PX: COLONOSCOPY: SHX5424

## 2011-10-25 HISTORY — DX: Headache: R51

## 2011-10-25 HISTORY — DX: Anxiety disorder, unspecified: F41.9

## 2011-10-25 HISTORY — DX: Cardiac arrhythmia, unspecified: I49.9

## 2011-10-25 HISTORY — DX: Fibromyalgia: M79.7

## 2011-10-25 SURGERY — Surgical Case
Anesthesia: *Unknown

## 2011-10-25 SURGERY — COLONOSCOPY
Anesthesia: Moderate Sedation

## 2011-10-25 MED ORDER — MIDAZOLAM HCL 5 MG/5ML IJ SOLN
INTRAMUSCULAR | Status: DC | PRN
  Administered 2011-10-25 (×2): 2.5 mg via INTRAVENOUS

## 2011-10-25 MED ORDER — FENTANYL NICU IV SYRINGE 50 MCG/ML
INJECTION | INTRAMUSCULAR | Status: DC | PRN
  Administered 2011-10-25: 25 ug via INTRAVENOUS

## 2011-10-25 MED ORDER — SODIUM CHLORIDE 0.9 % IV SOLN
Freq: Once | INTRAVENOUS | Status: AC
  Administered 2011-10-25: 500 mL via INTRAVENOUS

## 2011-10-25 MED ORDER — MIDAZOLAM HCL 10 MG/2ML IJ SOLN
INTRAMUSCULAR | Status: AC
  Filled 2011-10-25: qty 2

## 2011-10-25 MED ORDER — FENTANYL CITRATE 0.05 MG/ML IJ SOLN
INTRAMUSCULAR | Status: AC
  Filled 2011-10-25: qty 2

## 2011-10-25 NOTE — Procedures (Signed)
Procedure: Diagnostic colonoscopy with random colon biopsies.  Endoscopist: Danise Edge  Premedication: Versed 5 mg intravenously. Fentanyl 25 mcg intravenously.  Indication: [name] is an 77 year old female born September 15, 1927. The patient is undergoing diagnostic colonoscopy to evaluate chronic diarrhea and an episode of painless hematochezia.  In July 2004, the patient underwent a diagnostic colonoscopy with removal of a 2 mm adenomatous polyp from the cecum. Random colon biopsies did not show microscopic colitis.  In October 2009, the patient underwent a surveillance colonoscopy with removal of a 2 mm cecal polyp and 3 mm rectal polyp.  Procedure: The patient was placed in the left lateral decubitus position. Anal inspection and digital rectal exam were normal. The Pentax pediatric colonoscope was introduced into the rectum and easily advanced to the cecum. A normal-appearing ileocecal valve and appendiceal orifice were identified. Colonic preparation for the exam today was good  Rectum. Normal. Retroflexed view of the distal rectum normal. Sigmoid colon and descending colon. Extensive left colonic diverticulosis without colorectal neoplasia. Splenic flexure. Normal. Transverse colon. Normal Hepatic flexure. Normal Aescending colon. Normal Cecum and ileocecal valve. Normal  Biopsies: Random colon biopsies were performed from the right colon and left colon to look for microscopic colitis.  Assessment: Left colonic diverticulosis; otherwise normal colonoscopy to the cecum. Random colon biopsies to look for microscopic colitis are pending.

## 2011-10-25 NOTE — H&P (Signed)
  History: [name] is an 77 year old female born 10-18-1927. The patient has had diarrhea for years. She denies abdominal pain and nocturnal diarrhea.  She will typically experience bowel urgency after breakfast and pass a mushy to lose bowel movement. She may have 2-3 loose bowel movements per in the morning associated with small-volume fecal incontinence. She usually wears protection in the event of fecal incontinence. She takes loperamide 4 mg following a loose bowel movement in the morning and typically will not have a further bowel movement for approximately 24 hours. She has undergone urinary bladder tacking surgery and continues to have intermittent urinary incontinence. She has had 4 vaginal deliveries.  And 2009, the patient underwent a surveillance colonoscopy with removal of small adenomatous rectal polyps. She has colonic diverticulosis.  A few weeks ago, the patient had an episode of painless hematochezia which has not recurred.  The patient is scheduled to undergo diagnostic colonoscopy with random colon biopsies to look for microscopic colitis.  Medication allergies: Novocaine  Chronic medications: Atenolol. Calcium. Vitamin D. Fish oil. Levothyroxin.  Past medical and surgical history: Hypothyroidism. Osteoporosis. Polymyalgia rheumatica. Aortic valve insufficiency. Hypertension. Stress fracture. Liposarcoma of the left thigh. Intracerebral aneurysm. Hysterectomy with bilateral salpingo-oophorectomy. Bladder tacking surgery. Bilateral cataract surgery. Liposarcoma surgery of the left thigh.  Habits: The patient has never smoked cigarettes. She does not consume alcohol.  Exam: The patient is alert and appears healthy. Sclera are nonicteric. Mouth and throat appear normal. Lungs are clear to auscultation. Cardiac exam reveals a regular rhythm with murmur of aortic insufficiency. Abdomen is soft flat, and nontender to palpation in all quadrants.  Assessment: Proceed with  diagnostic colonoscopy is scheduled.

## 2011-10-26 ENCOUNTER — Encounter (HOSPITAL_COMMUNITY): Payer: Self-pay | Admitting: Gastroenterology

## 2011-10-30 ENCOUNTER — Other Ambulatory Visit: Payer: Self-pay | Admitting: Cardiology

## 2011-11-28 ENCOUNTER — Encounter: Payer: Self-pay | Admitting: Cardiology

## 2012-01-08 ENCOUNTER — Ambulatory Visit: Payer: Medicare Other | Admitting: Nurse Practitioner

## 2012-01-08 DIAGNOSIS — C499 Malignant neoplasm of connective and soft tissue, unspecified: Secondary | ICD-10-CM | POA: Insufficient documentation

## 2012-01-08 HISTORY — DX: Malignant neoplasm of connective and soft tissue, unspecified: C49.9

## 2012-01-10 ENCOUNTER — Ambulatory Visit (INDEPENDENT_AMBULATORY_CARE_PROVIDER_SITE_OTHER): Payer: Medicare Other | Admitting: Nurse Practitioner

## 2012-01-10 ENCOUNTER — Encounter: Payer: Self-pay | Admitting: Nurse Practitioner

## 2012-01-10 VITALS — BP 132/60 | HR 64 | Ht 65.0 in | Wt 128.0 lb

## 2012-01-10 DIAGNOSIS — IMO0001 Reserved for inherently not codable concepts without codable children: Secondary | ICD-10-CM

## 2012-01-10 DIAGNOSIS — R002 Palpitations: Secondary | ICD-10-CM

## 2012-01-10 DIAGNOSIS — R42 Dizziness and giddiness: Secondary | ICD-10-CM

## 2012-01-10 DIAGNOSIS — R030 Elevated blood-pressure reading, without diagnosis of hypertension: Secondary | ICD-10-CM

## 2012-01-10 DIAGNOSIS — I359 Nonrheumatic aortic valve disorder, unspecified: Secondary | ICD-10-CM

## 2012-01-10 DIAGNOSIS — I351 Nonrheumatic aortic (valve) insufficiency: Secondary | ICD-10-CM

## 2012-01-10 NOTE — Patient Instructions (Signed)
I think you are doing well.  Stay on your current medicines.  We will see you back in 4 months with Dr. Patty Sermons

## 2012-01-10 NOTE — Assessment & Plan Note (Signed)
No further spells of presyncope. She is felt to be doing well.

## 2012-01-10 NOTE — Assessment & Plan Note (Signed)
EF remains normal. She does have severe Ai with a dilated aorta per recent echo in 2012. Felt to be unchanged from her prior study in 2011. Will continue to follow.

## 2012-01-10 NOTE — Progress Notes (Signed)
Kayla Chambers Date of Birth: January 06, 1927 Medical Record #[id number]  History of Present Illness: Kayla Chambers is seen back today for her 4 month check. She is seen for Kayla Chambers. She has severe AI with a dilated aortic root. Her EF is normal. She had pre sycope back in October and has not had recurrence.   She comes in today. She is doing well. Her blood pressure at home has been good. She was taking just a half of her atenolol up until a month ago and then started taking a whole tablet. Blood pressure was fine regardless of the dose. She does have some "skips" but nothing that bothers her. No chest pain. Not really short of breath. No further spells of presyncope. Has just had follow up scans for her sarcoma.   Current Outpatient Prescriptions on File Prior to Visit  Medication Sig Dispense Refill  . aspirin 81 MG tablet Take 81 mg by mouth daily.        . calcium citrate-vitamin D (CITRACAL+D) 315-200 MG-UNIT per tablet Take 2 tablets by mouth daily.       . Cholecalciferol (VITAMIN D-3 PO) Take by mouth daily.        . fish oil-omega-3 fatty acids 1000 MG capsule Take 1 g by mouth 2 (two) times daily.        Marland Kitchen levothyroxine (SYNTHROID, LEVOTHROID) 75 MCG tablet Take 75 mcg by mouth daily.        . multivitamin (THERAGRAN) per tablet Take 1 tablet by mouth daily.        . Thiamine HCl (VITAMIN B-1) 100 MG tablet Take 300 mg by mouth daily.        Marland Kitchen DISCONTD: atenolol (TENORMIN) 25 MG tablet Take 1/2 tablet every day  45 tablet  4    Allergies  Allergen Reactions  . Fosamax   . Lyrica (Pregabalin)   . Neurontin (Gabapentin)   . Novocain   . Toprol Xl (Metoprolol Succinate)     Past Medical History  Diagnosis Date  . Hypothyroidism   . Heart palpitations   . 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 Dilated aortic root     Marked dilatation of aortic root per echo in 2011 with severe AI, mild to moderate MR, moderate TR  . Aortic  insufficiency     severe per echo in 2012  . Polymyalgia rheumatica   . Vertigo   . Peripheral neuropathy   . Intracranial aneurysm     in late 1970's  . Osteoporosis   . Rheumatic heart disease   . Heart murmur   . Hyperthyroidism   . Headache     hx migraines  . Anxiety   . Dysrhythmia   . Fibromyalgia     Past Surgical History  Procedure Date  . Sarcoma resection 2011  . Abdominal hysterectomy   . Bladder suspension   . Eye surgery   . Colonoscopy 10/25/2011    Procedure: COLONOSCOPY;  Surgeon: Charolett Bumpers, MD;  Location: [facility];  Service: Endoscopy;  Laterality: N/A;    History  Smoking status  . Never Smoker   Smokeless tobacco  . Never Used    History  Alcohol Use No    Family History  Problem Relation Age of Onset  . Heart disease Mother   . Stroke Mother   . Arthritis Father   . Cancer Father   . Hypertension Sister   .  Anesthesia problems Brother     Review of Systems: The review of systems is per the HPI.  All other systems were reviewed and are negative.  Physical Exam: BP 128/68  Pulse 64  Ht 5\' 5"  (1.651 m)  Wt 128 lb (58.06 kg)  BMI 21.30 kg/m2 Patient is very pleasant and in no acute distress. Skin is warm and dry. Color is normal.  HEENT is unremarkable. Normocephalic/atraumatic. PERRL. Sclera are nonicteric. Neck is supple. No masses. No JVD. Lungs are clear. Cardiac exam shows a regular rate and rhythm. Occasional ectopic noted. Loud 3/6 murmur noted of AI. Abdomen is soft. Extremities are without edema. Gait and ROM are intact. No gross neurologic deficits noted.  LABORATORY DATA: N/A   Assessment / Plan:

## 2012-01-10 NOTE — Assessment & Plan Note (Signed)
Has ectopics on exam. Nothing that bothers her. Will continue with her current regimen.

## 2012-01-10 NOTE — Assessment & Plan Note (Signed)
Blood pressure is doing well at home. I have left her on her current regimen. We will see her back in 4 months. Patient is agreeable to this plan and will call if any problems develop in the interim.

## 2012-03-21 ENCOUNTER — Emergency Department (HOSPITAL_COMMUNITY)
Admission: EM | Admit: 2012-03-21 | Discharge: 2012-03-21 | Disposition: A | Discharge status: Home / Self Care | Payer: Medicare Other | Attending: Emergency Medicine | Admitting: Emergency Medicine

## 2012-03-21 ENCOUNTER — Emergency Department (HOSPITAL_COMMUNITY): Payer: Medicare Other

## 2012-03-21 DIAGNOSIS — Z7982 Long term (current) use of aspirin: Secondary | ICD-10-CM | POA: Insufficient documentation

## 2012-03-21 DIAGNOSIS — R112 Nausea with vomiting, unspecified: Secondary | ICD-10-CM | POA: Insufficient documentation

## 2012-03-21 DIAGNOSIS — R011 Cardiac murmur, unspecified: Secondary | ICD-10-CM | POA: Insufficient documentation

## 2012-03-21 DIAGNOSIS — R197 Diarrhea, unspecified: Secondary | ICD-10-CM | POA: Insufficient documentation

## 2012-03-21 DIAGNOSIS — IMO0001 Reserved for inherently not codable concepts without codable children: Secondary | ICD-10-CM | POA: Insufficient documentation

## 2012-03-21 DIAGNOSIS — M810 Age-related osteoporosis without current pathological fracture: Secondary | ICD-10-CM | POA: Insufficient documentation

## 2012-03-21 DIAGNOSIS — Z79899 Other long term (current) drug therapy: Secondary | ICD-10-CM | POA: Insufficient documentation

## 2012-03-21 DIAGNOSIS — E0590 Thyrotoxicosis, unspecified without thyrotoxic crisis or storm: Secondary | ICD-10-CM | POA: Insufficient documentation

## 2012-03-21 DIAGNOSIS — R42 Dizziness and giddiness: Secondary | ICD-10-CM | POA: Insufficient documentation

## 2012-03-21 DIAGNOSIS — E039 Hypothyroidism, unspecified: Secondary | ICD-10-CM | POA: Insufficient documentation

## 2012-03-21 DIAGNOSIS — F411 Generalized anxiety disorder: Secondary | ICD-10-CM | POA: Insufficient documentation

## 2012-03-21 LAB — BASIC METABOLIC PANEL
BUN: 11 mg/dL (ref 6–23)
CO2: 25 mEq/L (ref 19–32)
Chloride: 102 mEq/L (ref 96–112)
Creatinine, Ser: 0.55 mg/dL (ref 0.50–1.10)

## 2012-03-21 LAB — CBC
HCT: 40.8 % (ref 36.0–46.0)
Hemoglobin: 13.6 g/dL (ref 12.0–15.0)
RBC: 4.34 MIL/uL (ref 3.87–5.11)
WBC: 6.1 10*3/uL (ref 4.0–10.5)

## 2012-03-21 LAB — URINALYSIS, ROUTINE W REFLEX MICROSCOPIC
Glucose, UA: NEGATIVE mg/dL
Leukocytes, UA: NEGATIVE
pH: 7.5 (ref 5.0–8.0)

## 2012-03-21 LAB — DIFFERENTIAL
Lymphocytes Relative: 13 % (ref 12–46)
Monocytes Absolute: 0.3 10*3/uL (ref 0.1–1.0)
Monocytes Relative: 5 % (ref 3–12)
Neutro Abs: 4.9 10*3/uL (ref 1.7–7.7)

## 2012-03-21 LAB — TROPONIN I: Troponin I: <0.3 ng/mL (ref <0.30)

## 2012-03-21 LAB — URINE MICROSCOPIC-ADD ON

## 2012-03-21 MED ORDER — SODIUM CHLORIDE 0.9 % IV SOLN
Freq: Once | INTRAVENOUS | Status: AC
  Administered 2012-03-21: 10 mL/h via INTRAVENOUS

## 2012-03-21 MED ORDER — ONDANSETRON HCL 4 MG/2ML IJ SOLN
4.0000 mg | Freq: Once | INTRAMUSCULAR | Status: AC
  Administered 2012-03-21: 4 mg via INTRAVENOUS
  Filled 2012-03-21: qty 2

## 2012-03-21 MED ORDER — MECLIZINE HCL 25 MG PO TABS
25.0000 mg | ORAL_TABLET | Freq: Once | ORAL | Status: AC
  Administered 2012-03-21: 25 mg via ORAL
  Filled 2012-03-21: qty 1

## 2012-03-21 MED ORDER — ONDANSETRON HCL 8 MG PO TABS: 8.0000 mg | ORAL_TABLET | Freq: Three times a day (TID) | ORAL | Status: AC | PRN

## 2012-03-21 MED ORDER — HYDROXYZINE HCL 25 MG PO TABS
25.0000 mg | ORAL_TABLET | Freq: Once | ORAL | Status: AC
  Administered 2012-03-21: 25 mg via ORAL
  Filled 2012-03-21: qty 1

## 2012-03-21 MED ORDER — HYDROXYZINE HCL 10 MG PO TABS: 10.0000 mg | ORAL_TABLET | Freq: Three times a day (TID) | ORAL | Status: AC | PRN

## 2012-03-21 NOTE — ED Provider Notes (Addendum)
History     CSN: [id number]  Arrival date & time 03/21/12  0820   First MD Initiated Contact with Patient 03/21/12 [phone]      Chief Complaint  Patient presents with  . Dizziness    on arising   . Diarrhea    x 2   . Emesis    x1    (Consider location/radiation/quality/duration/timing/severity/associated sxs/prior treatment) HPI History provided by pt and prior chart.  Pt felt well when she went to bed last night.  Woke at 5am to use the bathroom and felt very dizzy.  Describes as being off balance.  Fell into the wall several times while walking.  Sx recurred when she woke again shortly after to use bathroom again.  Had an episode of diarrhea and retching this time.  When she returned to bed, she continued to feel dizzy and nauseous, husband checked BP and systolic in 180s (atypical for her).  Pt was concerned that she was having a heart attack or stroke.  Dizziness associated w/ funny feeling in head as well as confusion, but pt can not describe further.  Has h/o vertigo but that usually presents w/ room-spinning dizziness and feels different than what shes experiencing today.  Denies head pain, blurred vision, dysarthria, dysphagia, extremity weakness/paresthesias.   Denies chest pain but felt short of breath.  Has not had fever, abdominal pain or urinary sx.  Has been eating and drinking normally.  No recent medication changes. Per prior chart, significant PMH includes cerebral aneurysm, sarcoma, aortic insufficiency and recurrent UTI.  Was admitted for presyncope in 08/2012, evaluated for stroke and work-up neg.    Past Medical History  Diagnosis Date  . Hypothyroidism   . Heart palpitations   . 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 Dilated aortic root     Marked dilatation of aortic root per echo in 2011 with severe AI, mild to moderate MR, moderate TR  . Aortic insufficiency     severe per echo in 2012  . Polymyalgia rheumatica   . Vertigo     . Peripheral neuropathy   . Intracranial aneurysm     in late 1970's  . Osteoporosis   . Rheumatic heart disease   . Heart murmur   . Hyperthyroidism   . Headache     hx migraines  . Anxiety   . Dysrhythmia   . Fibromyalgia     Past Surgical History  Procedure Date  . Sarcoma resection 2011  . Abdominal hysterectomy   . Bladder suspension   . Eye surgery   . Colonoscopy 10/25/2011    Procedure: COLONOSCOPY;  Surgeon: Charolett Bumpers, MD;  Location: [facility];  Service: Endoscopy;  Laterality: N/A;    Family History  Problem Relation Age of Onset  . Heart disease Mother   . Stroke Mother   . Arthritis Father   . Cancer Father   . Hypertension Sister   . Anesthesia problems Brother     History  Substance Use Topics  . Smoking status: Never Smoker   . Smokeless tobacco: Never Used  . Alcohol Use: No    OB History    Grav Para Term Preterm Abortions TAB SAB Ect Mult Living                  Review of Systems  All other systems reviewed and are negative.    Allergies  Alendronate sodium; Lyrica; Neurontin; Procaine  hcl; and Toprol xl  Home Medications   Current Outpatient Rx  Name Route Sig Dispense Refill  . ASPIRIN 81 MG PO TABS Oral Take 81 mg by mouth daily.      . ATENOLOL 25 MG PO TABS Oral Take 12.5 mg by mouth daily. Half to a whole daily    . CALCIUM CITRATE-VITAMIN D 315-200 MG-UNIT PO TABS Oral Take 2 tablets by mouth daily.     Marland Kitchen VITAMIN D-3 PO Oral Take by mouth daily.      Marland Kitchen CRANBERRY PO Oral Take by mouth every other day.    . OMEGA-3 FATTY ACIDS 1000 MG PO CAPS Oral Take 1 g by mouth 2 (two) times daily.      Marland Kitchen LEVOTHYROXINE SODIUM 75 MCG PO TABS Oral Take 75 mcg by mouth daily.      . MULTIVITAMINS PO TABS Oral Take 1 tablet by mouth daily.      Marland Kitchen VITAMIN B-1 100 MG PO TABS Oral Take 300 mg by mouth daily.        There were no vitals taken for this visit.  Physical Exam  Nursing note and vitals reviewed. Constitutional: She is  oriented to person, place, and time. She appears well-developed and well-nourished. No distress.  HENT:  Head: Normocephalic and atraumatic.  Mouth/Throat: Oropharynx is clear and moist.  Eyes:       Normal appearance  Neck: Normal range of motion.  Cardiovascular: Normal rate, regular rhythm and intact distal pulses.   Murmur heard. Pulmonary/Chest: Effort normal and breath sounds normal.  Abdominal: Soft. Bowel sounds are normal. She exhibits no distension. There is no tenderness.  Genitourinary:       No CVA ttp  Musculoskeletal: Normal range of motion.  Neurological: She is alert and oriented to person, place, and time. She displays no tremor. No sensory deficit.       CN 3-12 intact.  5/5 and equal upper and lower extremity strength.  No past pointing.  No pronator drift.  No nystagmus.  Pt reports dizziness when she sits up in bed and describes as feeling like shes falling to the side.  Pt off balance and very dependent on myself and nursing staff when ambulating.  She does not use a cane or walker at home.   Skin: Skin is warm and dry. No rash noted.  Psychiatric: She has a normal mood and affect. Her behavior is normal.    ED Course  Procedures (including critical care time)   Date: 03/21/2012  Rate: 65  Rhythm: normal sinus rhythm and premature ventricular contractions (PVC)  QRS Axis: left  Intervals: normal  ST/T Wave abnormalities: normal  Conduction Disutrbances:none  Narrative Interpretation:  LVH  Old EKG Reviewed: unchanged   Labs Reviewed  URINALYSIS, ROUTINE W REFLEX MICROSCOPIC - Abnormal; Notable for the following:    Hgb urine dipstick TRACE (*)    All other components within normal limits  CBC - Abnormal; Notable for the following:    Platelets 143 (*)    All other components within normal limits  DIFFERENTIAL - Abnormal; Notable for the following:    Neutrophils Relative 81 (*)    All other components within normal limits  BASIC METABOLIC PANEL -  Abnormal; Notable for the following:    Glucose, Bld 105 (*)    GFR calc non Af Amer 85 (*)    All other components within normal limits  TROPONIN I  URINE MICROSCOPIC-ADD ON   Mr Brain Wo Contrast  03/21/2012  *RADIOLOGY REPORT*  Clinical Data: Dizziness since this morning.  History of leg sarcoma.  MRI HEAD WITHOUT CONTRAST  Technique:  Multiplanar, multiecho pulse sequences of the brain and surrounding structures were obtained according to standard protocol without intravenous contrast.  Comparison: 08/04/2011 head CT.  No comparison MR.  Findings: No acute infarct.  No intracranial hemorrhage.  Moderate small vessel disease type changes.  Mild global atrophy without hydrocephalus.  No intracranial mass lesion detected on this unenhanced exam.  Major intracranial vascular structures are patent.  Mild narrowing of the upper cervical canal.  IMPRESSION: No acute infarct.  Please see above.  Original Report Authenticated By: Fuller Canada, M.D.     1. Vertigo       MDM  78yo F w/ h/o aortic insufficiency, recurrent UTI, sarcoma and aneurysm (location questionable; husband says lower back, pt says base of her brain/neck and prior chart says intracranial) presents w/ c/o dizziness (described as feeling off balance), ataxia and nausea.  Has intermittent vertigo but presents differently w/ room-spinning sensation.  ROS revealed that she was short of breath this morning as well.  Pt woke with these sx at 5am.  Pt continues to be dizzy when she moves here in ED.  On exam, pt A&Ox3, well-hydrated, mildly hypertensive, heart w/ RRR, lungs clear, abd benign/non-tender, no focal neuro deficits w/ exception of ataxia.  Pt receiving IV fluids/zofran and po meclizine.  I am concerned that patient may have a posterior circulation stroke.  EKG, labs and MRI brain pending.    EKG non-ischemic and unchanged from prior.  Labs unremarkable.  MRI brain neg for acute CVA.  Pt continues to feel dizzy with movement.   Will try a dose of hydroxyzine and reassess shortly.  10:43 AM   Pt reports feeling better.  Nursing staff assisted her while ambulating to the bathroom.  Her dizziness was improved and she was independent.  She feels well enough to go home.  Recommended she walk w/ walker/cane to prevent falls while she continues to experience dizziness. Prescribed hydroxyzine and zofran for sx.  Recommended both PCP and neuro f/u.  Return precautions discussed.        Otilio Miu, PA 03/21/12 1208  Otilio Miu, PA 03/21/12 [id number]

## 2012-03-21 NOTE — Discharge Instructions (Signed)
Take hydroxyzine as needed for dizziness and zofran as needed for nausea.  Drink plenty of fluids.  Follow up with your family doctor as well as the neurologist you have been referred to.   You should return to the ER if your dizziness worsens or you develop severe headache or uncontrolled vomiting.  Dizziness Dizziness is a common problem. It is a feeling of unsteadiness or lightheadedness. You may feel like you are about to faint. Dizziness can lead to injury if you stumble or fall. A person of any age group can suffer from dizziness, but dizziness is more common in older adults. CAUSES  Dizziness can be caused by many different things, including:  Middle ear problems.   Standing for too long.   Infections.   An allergic reaction.   Aging.   An emotional response to something, such as the sight of blood.   Side effects of medicines.   Fatigue.   Problems with circulation or blood pressure.   Excess use of alcohol, medicines, or illegal drug use.   Breathing too fast (hyperventilation).   An arrhythmia or problems with your heart rhythm.   Low red blood cell count (anemia).   Pregnancy.   Vomiting, diarrhea, fever, or other illnesses that cause dehydration.   Diseases or conditions such as Parkinson's disease, high blood pressure (hypertension), diabetes, and thyroid problems.   Exposure to extreme heat.  DIAGNOSIS  To find the cause of your dizziness, your caregiver may do a physical exam, lab tests, radiologic imaging scans, or an electrocardiography test (ECG).  TREATMENT  Treatment of dizziness depends on the cause of your symptoms and can vary greatly. HOME CARE INSTRUCTIONS   Drink enough fluids to keep your urine clear or pale yellow. This is especially important in very hot weather. In the elderly, it is also important in cold weather.   If your dizziness is caused by medicines, take them exactly as directed. When taking blood pressure medicines, it is  especially important to get up slowly.   Rise slowly from chairs and steady yourself until you feel okay.   In the morning, first sit up on the side of the bed. When this seems okay, stand slowly while holding onto something until you know your balance is fine.   If you need to stand in one place for a long time, be sure to move your legs often. Tighten and relax the muscles in your legs while standing.   If dizziness continues to be a problem, have someone stay with you for a day or two. Do this until you feel you are well enough to stay alone. Have the person call your caregiver if he or she notices changes in you that are concerning.   Do not drive or use heavy machinery if you feel dizzy.  SEEK IMMEDIATE MEDICAL CARE IF:   Your dizziness or lightheadedness gets worse.   You feel nauseous or vomit.   You develop problems with talking, walking, weakness, or using your arms, hands, or legs.   You are not thinking clearly or you have difficulty forming sentences. It may take a friend or family member to determine if your thinking is normal.   You develop chest pain, abdominal pain, shortness of breath, or sweating.   Your vision changes.   You notice any bleeding.   You have side effects from medicine that seems to be getting worse rather than better.  MAKE SURE YOU:   Understand these instructions.   Will  watch your condition.   Will get help right away if you are not doing well or get worse.  Document Released: 05/01/2001 Document Revised: 10/25/2011 Document Reviewed: 05/25/2011 [facility] Patient Information 2012 [place], [place].

## 2012-03-21 NOTE — ED Provider Notes (Signed)
Patient relates she woke up at 5 AM this morning and needed to use the bathroom to urinate. She states when she said that she felt like she was falling. She states she Falling against the wall walking to the bathroom. She denies headache and denies spinning. She relates shortly afterwards she had to go back to use the bathroom again and this time she had nausea and vomiting twice with diarrhea twice. She relates she had vertigo a couple years ago and that felt like she was "diving backwards into a pool of water". Patient relates she feels improved now.  Patient is alert and cooperative in no apparent distress.  Medical screening examination/treatment/procedure(s) were conducted as a shared visit with non-physician practitioner(s) and myself.  I personally evaluated the patient during the encounter Devoria Albe, MD, Franz Dell, MD 03/21/12 [phone]

## 2012-03-21 NOTE — ED Notes (Signed)
Pt reports waking to go to br. Was dizzy on rising. diarrheax2 and emesis x1

## 2012-03-22 NOTE — ED Provider Notes (Signed)
See prior note Devoria Albe, MD, Franz Dell, MD 03/22/12 [phone]

## 2012-04-22 ENCOUNTER — Other Ambulatory Visit: Payer: Self-pay | Admitting: Cardiology

## 2012-04-22 ENCOUNTER — Ambulatory Visit (INDEPENDENT_AMBULATORY_CARE_PROVIDER_SITE_OTHER): Payer: Medicare Other | Admitting: Cardiology

## 2012-04-22 ENCOUNTER — Encounter: Payer: Self-pay | Admitting: Cardiology

## 2012-04-22 VITALS — BP 114/47 | HR 57 | Ht 64.0 in | Wt 126.0 lb

## 2012-04-22 DIAGNOSIS — I351 Nonrheumatic aortic (valve) insufficiency: Secondary | ICD-10-CM

## 2012-04-22 DIAGNOSIS — I359 Nonrheumatic aortic valve disorder, unspecified: Secondary | ICD-10-CM

## 2012-04-22 DIAGNOSIS — R42 Dizziness and giddiness: Secondary | ICD-10-CM

## 2012-04-22 DIAGNOSIS — E039 Hypothyroidism, unspecified: Secondary | ICD-10-CM

## 2012-04-22 NOTE — Assessment & Plan Note (Signed)
The patient has not been having any orthopnea or paroxysmal nocturnal dyspnea.  No peripheral edema.  No angina pectoris.  She has a known dilated aortic root

## 2012-04-22 NOTE — Progress Notes (Signed)
Jake Michaelis Date of Birth:  [birth year] [facility] [address] [place], [place]  [address] [phone]         Fax   [phone]  History of Present Illness: This pleasant elderly woman is seen for a scheduled followup office visit.  She has a past history of known dilated aortic root with severe aortic insufficiency.  She is not interested in any open heart surgery.  She is not having any symptoms of congestive heart failure or angina pectoris.  She continues to maintain normal activities.  The patient also has a past history of recurrent vertigo.  She had a severe episode on May 3 requiring emergency room visit.  She was prescribed meclizine.  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  She also has a history of hypothyroidism and osteoporosis.  Current Outpatient Prescriptions  Medication Sig Dispense Refill  . aspirin 81 MG tablet Take 81 mg by mouth daily.        Marland Kitchen atenolol (TENORMIN) 25 MG tablet Take 25 mg by mouth daily. Half to a whole daily      . Calcium Carbonate-Vitamin D (CALCIUM + D PO) Take 3 tablets by mouth daily.      . Cholecalciferol (VITAMIN D-3 PO) Take 2,000 Units by mouth daily.       Marland Kitchen CRANBERRY PO Take by mouth every other day.      . fish oil-omega-3 fatty acids 1000 MG capsule Take 2 g by mouth 2 (two) times daily.       Marland Kitchen levothyroxine (SYNTHROID, LEVOTHROID) 75 MCG tablet Take 75 mcg by mouth daily.        . multivitamin (THERAGRAN) per tablet Take 1 tablet by mouth daily.        . NON FORMULARY Vision Pills Daily      . Thiamine HCl (VITAMIN B-1) 100 MG tablet Take 100 mg by mouth daily.         Allergies  Allergen Reactions  . Alendronate Sodium Nausea And Vomiting  . Lyrica (Pregabalin) Nausea And Vomiting  . Neurontin (Gabapentin) Nausea And Vomiting  . Procaine Hcl   . Toprol Xl (Metoprolol Succinate) Other  (See Comments)    Does not remember     Patient Active Problem List  Diagnoses  . Aortic insufficiency  . Dilated aortic root  . Sarcoma  . Heart palpitations  . Hypothyroidism  . Heart murmur  . Polymyalgia rheumatica  . Peripheral neuropathy  . Dizziness  . Elevated blood pressure    History  Smoking status  . Never Smoker   Smokeless tobacco  . Never Used    History  Alcohol Use No    Family History  Problem Relation Age of Onset  . Heart disease Mother   . Stroke Mother   . Arthritis Father   . Cancer Father   . Hypertension Sister   . Anesthesia problems Brother     Review of Systems: Constitutional: no fever chills diaphoresis or fatigue or change in weight.  Head and neck: no hearing loss, no epistaxis, no photophobia or visual disturbance. Respiratory: No cough, shortness of breath or wheezing. Cardiovascular: No chest pain peripheral edema, palpitations. Gastrointestinal: No abdominal distention, no abdominal pain, no change in bowel habits hematochezia or melena. Genitourinary: No dysuria, no frequency, no urgency, no nocturia. Musculoskeletal:No arthralgias, no back pain, no  gait disturbance or myalgias. Neurological: No dizziness, no headaches, no numbness, no seizures, no syncope, no weakness, no tremors. Hematologic: No lymphadenopathy, no easy bruising. Psychiatric: No confusion, no hallucinations, no sleep disturbance.    Physical Exam: Filed Vitals:   04/22/12 1033  BP: 114/47  Pulse: 57   the general appearance reveals a well-developed well-nourished woman in no distress.The head and neck exam reveals pupils equal and reactive.  Extraocular movements are full.  There is no scleral icterus.  The mouth and pharynx are normal.  The neck is supple.  The carotids reveal no bruits.  The jugular venous pressure is normal.  The  thyroid is not enlarged.  There is no lymphadenopathy.  The chest is clear to percussion and auscultation.  There are no  rales or rhonchi.  Expansion of the chest is symmetrical.  The precordium is quiet.  The first heart sound is normal.  The second heart sound is physiologically split.  There is no  gallop rub or click.  There is a widely heard grade 3/6 decrescendo murmur of aortic insufficiency audible at the left sternal edge and right sternal edge.  No gallop There is no abnormal lift or heave.  The abdomen is soft and nontender.  The bowel sounds are normal.  The liver and spleen are not enlarged.  There are no abdominal masses.  There are no abdominal bruits.  Extremities reveal good pedal pulses.  There is no phlebitis.  She does have mild lymphedema of the left lower extremity secondary to her previous removal of sarcoma from her left thigh  There is no cyanosis or clubbing.  Strength is normal and symmetrical in all extremities.  There is no lateralizing weakness.  There are no sensory deficits.  The skin is warm and dry.  There is no rash.    Assessment / Plan: Continue on same medication.  Recheck in 6 months for followup office visit and EKG

## 2012-04-22 NOTE — Patient Instructions (Signed)
Your physician wants you to follow-up in: 6 months  You will receive a reminder letter in the mail two months in advance. If you don't receive a letter, please call our office to schedule the follow-up appointment.  Your physician recommends that you continue on your current medications as directed. Please refer to the Current Medication list given to you today.  

## 2012-04-22 NOTE — Assessment & Plan Note (Signed)
Patient has occasional episodes of dizziness associated with her intermittent vertigo.  She uses meclizine on a when necessary basis

## 2012-04-22 NOTE — Assessment & Plan Note (Signed)
The patient is clinically euthyroid.  Thyroid function studies are followed by her internist Dr.Sun

## 2012-04-23 ENCOUNTER — Other Ambulatory Visit: Payer: Self-pay | Admitting: Cardiology

## 2012-04-23 MED ORDER — ATENOLOL 25 MG PO TABS: ORAL_TABLET | ORAL | Status: DC

## 2012-06-25 ENCOUNTER — Other Ambulatory Visit: Payer: Self-pay | Admitting: *Deleted

## 2012-06-25 DIAGNOSIS — F419 Anxiety disorder, unspecified: Secondary | ICD-10-CM

## 2012-06-25 DIAGNOSIS — G4700 Insomnia, unspecified: Secondary | ICD-10-CM

## 2012-06-25 MED ORDER — ALPRAZOLAM 0.5 MG PO TABS: 0.2500 mg | ORAL_TABLET | Freq: Every evening | ORAL | Status: DC | PRN

## 2012-09-29 ENCOUNTER — Other Ambulatory Visit: Payer: Self-pay

## 2012-09-29 ENCOUNTER — Telehealth: Payer: Self-pay | Admitting: Cardiology

## 2012-09-29 MED ORDER — ATENOLOL 25 MG PO TABS: ORAL_TABLET | ORAL | Status: DC

## 2012-09-29 NOTE — Telephone Encounter (Signed)
Discussed Atenolol with patient and she stated she had worked out refill

## 2012-09-29 NOTE — Telephone Encounter (Signed)
Pt rtning your call to discuss her medication

## 2012-10-07 DIAGNOSIS — K769 Liver disease, unspecified: Secondary | ICD-10-CM | POA: Insufficient documentation

## 2012-10-07 DIAGNOSIS — R911 Solitary pulmonary nodule: Secondary | ICD-10-CM | POA: Insufficient documentation

## 2012-10-29 ENCOUNTER — Encounter: Payer: Self-pay | Admitting: Cardiology

## 2012-10-29 ENCOUNTER — Ambulatory Visit (INDEPENDENT_AMBULATORY_CARE_PROVIDER_SITE_OTHER): Payer: Medicare Other | Admitting: Cardiology

## 2012-10-29 ENCOUNTER — Ambulatory Visit (INDEPENDENT_AMBULATORY_CARE_PROVIDER_SITE_OTHER)
Admission: RE | Admit: 2012-10-29 | Discharge: 2012-10-29 | Disposition: A | Discharge status: Home / Self Care | Payer: Medicare Other | Source: Ambulatory Visit | Attending: Cardiology | Admitting: Cardiology

## 2012-10-29 VITALS — BP 133/66 | HR 59 | Resp 18 | Ht 64.0 in | Wt 123.4 lb

## 2012-10-29 DIAGNOSIS — C499 Malignant neoplasm of connective and soft tissue, unspecified: Secondary | ICD-10-CM

## 2012-10-29 DIAGNOSIS — I351 Nonrheumatic aortic (valve) insufficiency: Secondary | ICD-10-CM

## 2012-10-29 DIAGNOSIS — R002 Palpitations: Secondary | ICD-10-CM

## 2012-10-29 DIAGNOSIS — I359 Nonrheumatic aortic valve disorder, unspecified: Secondary | ICD-10-CM

## 2012-10-29 DIAGNOSIS — M353 Polymyalgia rheumatica: Secondary | ICD-10-CM

## 2012-10-29 NOTE — Progress Notes (Signed)
Jake Michaelis Date of Birth:  06-07-1927 [facility] [address] [place], [place]  [address] [phone]         Fax   [phone]  History of Present Illness: This pleasant elderly woman is seen for a scheduled followup office visit. She has a past history of known dilated aortic root with severe aortic insufficiency. She is not interested in any open heart surgery. She is not having any symptoms of congestive heart failure or angina pectoris. She continues to maintain normal activities.  Her last echocardiogram was 08/10/11 and showed an ejection fraction of 65-70% with severe aortic insufficiency and mild mitral regurgitation and her aortic root maximum diameter was 56 mm. The patient also has a past history of recurrent vertigo.Marland Kitchen She was prescribed meclizine. 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 for this every 6 months.  She had a recent MRI of her leg at [facility] 2 weeks ago which suggested that she might be some local recurrence.  The patient has been experiencing some pain and tightness in the upper left thigh and left groin area. She also has a history of hypothyroidism and osteoporosis.   Current Outpatient Prescriptions  Medication Sig Dispense Refill  . ALPRAZolam (XANAX) 0.5 MG tablet Take 0.5 tablets (0.25 mg total) by mouth at bedtime as needed. sleep  30 tablet  5  . aspirin 81 MG tablet Take 81 mg by mouth daily.        Marland Kitchen atenolol (TENORMIN) 25 MG tablet 1/2 TABLET TO 1 TABLET DAILY, AS DIRECTED  60 tablet  5  . Calcium Carbonate-Vitamin D (CALCIUM + D PO) Take 2 tablets by mouth daily.       . Cholecalciferol (VITAMIN D-3 PO) Take 2,000 Units by mouth daily.       Marland Kitchen CRANBERRY PO Take by mouth every other day.      . fish oil-omega-3 fatty acids 1000 MG capsule Take 2 g by mouth 2 (two) times daily.       Marland Kitchen levothyroxine (SYNTHROID, LEVOTHROID) 75 MCG tablet Take 75 mcg by mouth  daily.        . multivitamin (THERAGRAN) per tablet Take 1 tablet by mouth daily.        . NON FORMULARY Vision Pills Daily      . Thiamine HCl (VITAMIN B-1) 100 MG tablet Take 100 mg by mouth daily.         Allergies  Allergen Reactions  . Alendronate Sodium Nausea And Vomiting  . Lyrica (Pregabalin) Nausea And Vomiting  . Neurontin (Gabapentin) Nausea And Vomiting  . Procaine Hcl   . Toprol Xl (Metoprolol Succinate) Other (See Comments)    Does not remember     Patient Active Problem List  Diagnosis  . Aortic insufficiency  . Dilated aortic root  . Sarcoma  . Heart palpitations  . Hypothyroidism  . Heart murmur  . Polymyalgia rheumatica  . Peripheral neuropathy  . Dizziness  . Elevated blood pressure    History  Smoking status  . Never Smoker   Smokeless tobacco  . Never Used    History  Alcohol Use No    Family History  Problem Relation Age of Onset  . Heart disease Mother   . Stroke Mother   . Arthritis Father   . Cancer Father   . Hypertension Sister   . Anesthesia problems Brother     Review  of Systems: Constitutional: no fever chills diaphoresis or fatigue or change in weight.  Head and neck: no hearing loss, no epistaxis, no photophobia or visual disturbance. Respiratory: No cough, shortness of breath or wheezing. Cardiovascular: No chest pain peripheral edema, palpitations. Gastrointestinal: No abdominal distention, no abdominal pain, no change in bowel habits hematochezia or melena. Genitourinary: No dysuria, no frequency, no urgency, no nocturia. Musculoskeletal:No arthralgias, no back pain, no gait disturbance or myalgias. Neurological: No dizziness, no headaches, no numbness, no seizures, no syncope, no weakness, no tremors. Hematologic: No lymphadenopathy, no easy bruising. Psychiatric: No confusion, no hallucinations, no sleep disturbance.    Physical Exam: Filed Vitals:   10/29/12 1451  BP: 133/66  Pulse: 59  Resp: 18   the  general appearance reveals a well-developed well-nourished woman in no distress.The head and neck exam reveals pupils equal and reactive.  Extraocular movements are full.  There is no scleral icterus.  The mouth and pharynx are normal.  The neck is supple.  The carotids reveal no bruits.  The jugular venous pressure is normal.  The  thyroid is not enlarged.  There is no lymphadenopathy.  The chest is clear to percussion and auscultation.  There are no rales or rhonchi.  Expansion of the chest is symmetrical.  The precordium is quiet.  The first heart sound is normal.  The second heart sound is physiologically split.  There is grade 3/6 decrescendo murmur of aortic insufficiency at the left sternal edge. There is no abnormal lift or heave.  The abdomen is soft and nontender.  The bowel sounds are normal.  The liver and spleen are not enlarged.  There are no abdominal masses.  There are no abdominal bruits.  Extremities reveal good pedal pulses.  There is 2+ edema of the left lower leg and ankle.  There is no cyanosis or clubbing.  There is significant loss of left thigh muscle from previous surgical resection of sarcoma.  There is no lateralizing weakness.  There are no sensory deficits.  The skin is warm and dry.  There is no rash.  EKG shows normal sinus rhythm and lateral ST segment depression unchanged from 03/21/12.  Since last tracing, PVCs are absent  Assessment / Plan: We will get a chest x-ray to look at heart size.  We will update her two-dimensional echocardiogram.  Continue medical therapy.  She states she is not interested in heart or aorta surgery.  Her sarcoma situation remains a concern and is being followed now at closer 3 month intervals by [facility]. Recheck here in 6 months for followup office visit.

## 2012-10-29 NOTE — Assessment & Plan Note (Signed)
The patient continues to have occasional short runs of palpitations.  No sustained arrhythmia.  It is associated with mild shortness of breath.

## 2012-10-29 NOTE — Patient Instructions (Signed)
Your physician recommends that you continue on your current medications as directed. Please refer to the Current Medication list given to you today.  Your physician wants you to follow-up in: 6 months You will receive a reminder letter in the mail two months in advance. If you don't receive a letter, please call our office to schedule the follow-up appointment.   GO FOR A CHEST XRAY TODAY AT THE [facility]  Your physician has requested that you have an echocardiogram. Echocardiography is a painless test that uses sound waves to create images of your heart. It provides your doctor with information about the size and shape of your heart and how well your heart's chambers and valves are working. This procedure takes approximately one hour. There are no restrictions for this procedure.

## 2012-10-29 NOTE — Assessment & Plan Note (Signed)
The patient is not having any active symptoms from her polymyalgia rheumatica and she is not presently on any prednisone

## 2012-10-29 NOTE — Assessment & Plan Note (Signed)
The patient has had severe aortic insufficiency by previous echocardiograms.  Her last echocardiogram was 08/10/11.  We'll update her echo and also get a chest x-ray to look at any change in heart size since last visit.

## 2012-10-29 NOTE — Assessment & Plan Note (Signed)
The patient is having some increasing symptoms in the area of the previous sarcoma resection of her left thigh muscle.  This is being followed closely at [facility].  Patient has had significant edema of the left lower leg and foot

## 2012-11-05 ENCOUNTER — Telehealth: Payer: Self-pay | Admitting: *Deleted

## 2012-11-05 ENCOUNTER — Ambulatory Visit (HOSPITAL_COMMUNITY): Payer: Medicare Other | Attending: Cardiology

## 2012-11-05 DIAGNOSIS — R42 Dizziness and giddiness: Secondary | ICD-10-CM | POA: Insufficient documentation

## 2012-11-05 DIAGNOSIS — I079 Rheumatic tricuspid valve disease, unspecified: Secondary | ICD-10-CM | POA: Insufficient documentation

## 2012-11-05 DIAGNOSIS — I359 Nonrheumatic aortic valve disorder, unspecified: Secondary | ICD-10-CM

## 2012-11-05 DIAGNOSIS — R002 Palpitations: Secondary | ICD-10-CM | POA: Insufficient documentation

## 2012-11-05 DIAGNOSIS — I351 Nonrheumatic aortic (valve) insufficiency: Secondary | ICD-10-CM

## 2012-11-05 DIAGNOSIS — G609 Hereditary and idiopathic neuropathy, unspecified: Secondary | ICD-10-CM | POA: Insufficient documentation

## 2012-11-05 NOTE — Telephone Encounter (Signed)
Dr. Patty Sermons spoke with patient

## 2012-11-05 NOTE — Telephone Encounter (Signed)
Message copied by Burnell Blanks on Wed Nov 05, 2012  3:49 PM ------      Message from: Cassell Clement      Created: Wed Nov 05, 2012  1:35 PM       I called the patient and gave her the report of her echocardiogram.  Her aortic root has increased further in size since 2012.  In the past she has refused to consider surgery on her aortic root or her aortic valve.  Still is not willing to entertain the idea of surgery.  She is particularly concerned now with her left leg in which she has possible recurrence of sarcoma.  We will plan to recheck another echocardiogram in one year.

## 2012-11-05 NOTE — Progress Notes (Signed)
Echocardiogram performed.  

## 2013-01-30 ENCOUNTER — Inpatient Hospital Stay (HOSPITAL_COMMUNITY)
Admission: AD | Admit: 2013-01-30 | Discharge: 2013-02-04 | DRG: 286 | Disposition: A | Discharge status: Home / Self Care | Payer: Medicare Other | Source: Ambulatory Visit | Attending: Cardiology | Admitting: Cardiology

## 2013-01-30 ENCOUNTER — Ambulatory Visit (INDEPENDENT_AMBULATORY_CARE_PROVIDER_SITE_OTHER): Payer: Medicare Other | Admitting: Cardiology

## 2013-01-30 ENCOUNTER — Inpatient Hospital Stay (HOSPITAL_COMMUNITY): Payer: Medicare Other

## 2013-01-30 ENCOUNTER — Encounter: Payer: Self-pay | Admitting: Cardiology

## 2013-01-30 VITALS — BP 116/67 | HR 116 | Ht 65.0 in | Wt 120.6 lb

## 2013-01-30 DIAGNOSIS — C499 Malignant neoplasm of connective and soft tissue, unspecified: Secondary | ICD-10-CM | POA: Diagnosis present

## 2013-01-30 DIAGNOSIS — Z79899 Other long term (current) drug therapy: Secondary | ICD-10-CM

## 2013-01-30 DIAGNOSIS — C4920 Malignant neoplasm of connective and soft tissue of unspecified lower limb, including hip: Secondary | ICD-10-CM | POA: Diagnosis present

## 2013-01-30 DIAGNOSIS — I509 Heart failure, unspecified: Secondary | ICD-10-CM | POA: Diagnosis present

## 2013-01-30 DIAGNOSIS — I5033 Acute on chronic diastolic (congestive) heart failure: Secondary | ICD-10-CM | POA: Diagnosis present

## 2013-01-30 DIAGNOSIS — I351 Nonrheumatic aortic (valve) insufficiency: Secondary | ICD-10-CM | POA: Diagnosis present

## 2013-01-30 DIAGNOSIS — I4891 Unspecified atrial fibrillation: Secondary | ICD-10-CM

## 2013-01-30 DIAGNOSIS — G609 Hereditary and idiopathic neuropathy, unspecified: Secondary | ICD-10-CM | POA: Diagnosis present

## 2013-01-30 DIAGNOSIS — M810 Age-related osteoporosis without current pathological fracture: Secondary | ICD-10-CM | POA: Diagnosis present

## 2013-01-30 DIAGNOSIS — I359 Nonrheumatic aortic valve disorder, unspecified: Secondary | ICD-10-CM | POA: Diagnosis present

## 2013-01-30 DIAGNOSIS — I77810 Thoracic aortic ectasia: Secondary | ICD-10-CM | POA: Diagnosis present

## 2013-01-30 DIAGNOSIS — IMO0001 Reserved for inherently not codable concepts without codable children: Secondary | ICD-10-CM | POA: Diagnosis present

## 2013-01-30 DIAGNOSIS — Z7901 Long term (current) use of anticoagulants: Secondary | ICD-10-CM

## 2013-01-30 DIAGNOSIS — I2510 Atherosclerotic heart disease of native coronary artery without angina pectoris: Secondary | ICD-10-CM | POA: Diagnosis present

## 2013-01-30 DIAGNOSIS — M353 Polymyalgia rheumatica: Secondary | ICD-10-CM | POA: Diagnosis present

## 2013-01-30 DIAGNOSIS — E039 Hypothyroidism, unspecified: Secondary | ICD-10-CM | POA: Diagnosis present

## 2013-01-30 HISTORY — DX: Polymyalgia rheumatica: M35.3

## 2013-01-30 HISTORY — DX: Malignant neoplasm of connective and soft tissue, unspecified: C49.9

## 2013-01-30 HISTORY — DX: Atherosclerotic heart disease of native coronary artery without angina pectoris: I25.10

## 2013-01-30 LAB — COMPREHENSIVE METABOLIC PANEL
Albumin: 3.6 g/dL (ref 3.5–5.2)
BUN: 16 mg/dL (ref 6–23)
Calcium: 9.7 mg/dL (ref 8.4–10.5)
GFR calc Af Amer: >90 mL/min (ref >90)
Glucose, Bld: 111 mg/dL — ABNORMAL HIGH (ref 70–99)
Sodium: 137 mEq/L (ref 135–145)
Total Protein: 6.3 g/dL (ref 6.0–8.3)

## 2013-01-30 LAB — CBC WITH DIFFERENTIAL/PLATELET
Basophils Absolute: 0 10*3/uL (ref 0.0–0.1)
Basophils Relative: 0 % (ref 0–1)
Eosinophils Absolute: 0.1 10*3/uL (ref 0.0–0.7)
Eosinophils Relative: 1 % (ref 0–5)
Lymphs Abs: 1.7 10*3/uL (ref 0.7–4.0)
MCH: 32.9 pg (ref 26.0–34.0)
MCHC: 35.3 g/dL (ref 30.0–36.0)
MCV: 93.2 fL (ref 78.0–100.0)
Neutro Abs: 4.1 10*3/uL (ref 1.7–7.7)
Neutrophils Relative %: 64 % (ref 43–77)
Platelets: 160 10*3/uL (ref 150–400)
RBC: 4.29 MIL/uL (ref 3.87–5.11)

## 2013-01-30 LAB — MAGNESIUM: Magnesium: 2.1 mg/dL (ref 1.5–2.5)

## 2013-01-30 LAB — PROTIME-INR
INR: 1 (ref 0.00–1.49)
Prothrombin Time: 13.1 seconds (ref 11.6–15.2)

## 2013-01-30 LAB — TROPONIN I: Troponin I: <0.3 ng/mL (ref <0.30)

## 2013-01-30 MED ORDER — DILTIAZEM HCL 100 MG IV SOLR
5.0000 mg/h | INTRAVENOUS | Status: DC
  Administered 2013-01-30 – 2013-01-31 (×2): 5 mg/h via INTRAVENOUS
  Filled 2013-01-30 (×2): qty 100

## 2013-01-30 MED ORDER — HEPARIN (PORCINE) IN NACL 100-0.45 UNIT/ML-% IJ SOLN
850.0000 [IU]/h | INTRAMUSCULAR | Status: DC
  Administered 2013-01-30: 750 [IU]/h via INTRAVENOUS
  Administered 2013-01-31: 850 [IU]/h via INTRAVENOUS
  Filled 2013-01-30 (×5): qty 250

## 2013-01-30 MED ORDER — FUROSEMIDE 20 MG PO TABS
20.0000 mg | ORAL_TABLET | Freq: Every day | ORAL | Status: DC
  Administered 2013-01-30 – 2013-02-04 (×5): 20 mg via ORAL
  Filled 2013-01-30 (×6): qty 1

## 2013-01-30 MED ORDER — SODIUM CHLORIDE 0.9 % IJ SOLN
3.0000 mL | Freq: Two times a day (BID) | INTRAMUSCULAR | Status: DC
  Administered 2013-01-30: 3 mL via INTRAVENOUS

## 2013-01-30 MED ORDER — SODIUM CHLORIDE 0.9 % IJ SOLN: 3.0000 mL | INTRAMUSCULAR | Status: DC | PRN

## 2013-01-30 MED ORDER — ACETAMINOPHEN 325 MG PO TABS: 650.0000 mg | ORAL_TABLET | ORAL | Status: DC | PRN

## 2013-01-30 MED ORDER — ONDANSETRON HCL 4 MG/2ML IJ SOLN: 4.0000 mg | Freq: Four times a day (QID) | INTRAMUSCULAR | Status: DC | PRN

## 2013-01-30 MED ORDER — SODIUM CHLORIDE 0.9 % IJ SOLN: 3.0000 mL | Freq: Two times a day (BID) | INTRAMUSCULAR | Status: DC

## 2013-01-30 MED ORDER — METOPROLOL TARTRATE 12.5 MG HALF TABLET
12.5000 mg | ORAL_TABLET | Freq: Two times a day (BID) | ORAL | Status: DC
  Administered 2013-01-30 – 2013-02-04 (×9): 12.5 mg via ORAL
  Filled 2013-01-30 (×11): qty 1

## 2013-01-30 MED ORDER — LISINOPRIL 2.5 MG PO TABS
2.5000 mg | ORAL_TABLET | Freq: Every day | ORAL | Status: DC
  Administered 2013-01-30 – 2013-02-04 (×6): 2.5 mg via ORAL
  Filled 2013-01-30 (×6): qty 1

## 2013-01-30 MED ORDER — LEVOTHYROXINE SODIUM 75 MCG PO TABS
75.0000 ug | ORAL_TABLET | Freq: Every day | ORAL | Status: DC
  Administered 2013-01-31 – 2013-02-04 (×5): 75 ug via ORAL
  Filled 2013-01-30 (×6): qty 1

## 2013-01-30 MED ORDER — ASPIRIN EC 81 MG PO TBEC
81.0000 mg | DELAYED_RELEASE_TABLET | Freq: Every day | ORAL | Status: DC
  Administered 2013-01-31 – 2013-02-03 (×4): 81 mg via ORAL
  Filled 2013-01-30 (×6): qty 1

## 2013-01-30 MED ORDER — ALPRAZOLAM 0.25 MG PO TABS
0.2500 mg | ORAL_TABLET | Freq: Two times a day (BID) | ORAL | Status: DC | PRN
  Administered 2013-02-03: 0.25 mg via ORAL
  Filled 2013-01-30: qty 1

## 2013-01-30 MED ORDER — SODIUM CHLORIDE 0.9 % IV SOLN: 250.0000 mL | INTRAVENOUS | Status: DC | PRN

## 2013-01-30 MED ORDER — HEPARIN BOLUS VIA INFUSION
3000.0000 [IU] | Freq: Once | INTRAVENOUS | Status: AC
  Administered 2013-01-30: 3000 [IU] via INTRAVENOUS
  Filled 2013-01-30: qty 3000

## 2013-01-30 NOTE — Progress Notes (Signed)
Jake Michaelis Date of Birth:  1927/10/05 [facility] [address] [place], [place]  [address] [phone]         Fax   [phone]  History of Present Illness:       History and physical    This pleasant 79 year old woman is seen in the office today as a work in the office visit.  She has a past history of known dilated aortic root with severe aortic insufficiency.  Up until now she has not been interested in pursuing any consideration of open heart surgery or repair of her dilated aortic root or aortic valve replacement.  Several days ago she noted increased palpitations and spoke to the physician on call.  Today she comes in for further evaluation and is found to be in atrial fibrillation with rapid ventricular response.  This is a new heart rhythm for her.  She previously had had isolated palpitations.  She has had increased shortness of breath.  She has had orthopnea.  She has had difficulty sleeping because her heart has been pounding.  She has been on a baby aspirin daily and she has not had any TIA symptoms.  Her last echocardiogram done on 11/05/12 showed an ejection fraction of 60% with moderate to severe aortic insufficiency and a dilated aortic root.  At the level of the sinus of Valsalva the aortic root diameter was 61 mm.  She is seen in the office today because of these symptoms of forceful heartbeat and increased dyspnea.  She has not had any chest pain to suggest angina.  She states that she is now interested in pursuing surgical options for her aortic root disease and aortic valve insufficiency.  Complicating her history is the fact that she has a past history of sarcoma of the left leg.  She has had previous surgery.  She is followed for this at [facility].  In December she had an MRI of her leg at [facility] which suggested that there might be some problem with local recurrence.  She is due to see the physicians at [facility] again in several weeks.  She  herself is concerned that there may be some recurrence because of the increased thickening in the area of the left upper inner thigh and left groin area.   Current Outpatient Prescriptions  Medication Sig Dispense Refill  . ALPRAZolam (XANAX) 0.5 MG tablet Take 0.5 tablets (0.25 mg total) by mouth at bedtime as needed. sleep  30 tablet  5  . aspirin 81 MG tablet Take 81 mg by mouth daily.        Marland Kitchen atenolol (TENORMIN) 25 MG tablet Take 25 mg by mouth daily.      . Calcium Carbonate-Vitamin D (CALCIUM + D PO) Take 2 tablets by mouth daily.       . Cholecalciferol (VITAMIN D-3 PO) Take 2,000 Units by mouth daily.       Marland Kitchen CRANBERRY PO Take by mouth. Takes 1-3 daily      . fish oil-omega-3 fatty acids 1000 MG capsule Take 2 g by mouth 2 (two) times daily.       Marland Kitchen levothyroxine (SYNTHROID, LEVOTHROID) 75 MCG tablet Take 75 mcg by mouth daily.        . multivitamin (THERAGRAN) per tablet Take 1 tablet by mouth daily.        . Probiotic Product (PROBIOTIC DAILY PO) Take by mouth.      Marland Kitchen VITAMIN B1-B12 PO Take by mouth.  No current facility-administered medications for this visit.    Allergies  Allergen Reactions  . Alendronate Sodium Nausea And Vomiting  . Lyrica (Pregabalin) Nausea And Vomiting  . Neurontin (Gabapentin) Nausea And Vomiting  . Procaine Hcl   . Toprol Xl (Metoprolol Succinate) Other (See Comments)    Does not remember     Patient Active Problem List  Diagnosis  . Aortic insufficiency  . Dilated aortic root  . Sarcoma  . Heart palpitations  . Hypothyroidism  . Heart murmur  . Polymyalgia rheumatica  . Peripheral neuropathy  . Dizziness  . Elevated blood pressure    History  Smoking status  . Never Smoker   Smokeless tobacco  . Never Used    History  Alcohol Use No    Family History  Problem Relation Age of Onset  . Heart disease Mother   . Stroke Mother   . Arthritis Father   . Cancer Father   . Hypertension Sister   . Anesthesia problems  Brother     Review of Systems: Constitutional: no fever chills diaphoresis or fatigue or change in weight.  Head and neck: no hearing loss, no epistaxis, no photophobia or visual disturbance. Respiratory: No cough, shortness of breath or wheezing. Cardiovascular: No chest pain peripheral edema, positive for palpitations Gastrointestinal: No abdominal distention, no abdominal pain, no change in bowel habits hematochezia or melena. Genitourinary: No dysuria, the patient has had urinary frequency no chills or fever Musculoskeletal:No arthralgias, no back pain, no gait disturbance or myalgias.  The patient has had discomfort in the left thigh in the area of her previous sarcoma Neurological: No dizziness, no headaches, no numbness, no seizures, no syncope, no weakness, no tremors. Hematologic: No lymphadenopathy, no easy bruising. Psychiatric: No confusion, no hallucinations, no sleep disturbance.    Physical Exam: Filed Vitals:   01/30/13 1547  BP: 116/67  Pulse: 116   the general appearance reveals an elderly woman in no acute distress.  Head and neck exam reveal that the pupils are equal round reactive to light and accommodation there is no scleral icterus.  Mouth and pharynx are normal.  No lymphadenopathy.  She does have elevated jugular venous pressure.  Carotids reveal no bruits and the carotid upstroke is bounding consistent with her severe aortic insufficiency The chest is clear to percussion and auscultation.  The heart reveals a hyperdynamic precordium with an irregular rapid heart action and grade 3-4/6 diastolic aortic murmur of aortic insufficiency.  No S3 gallop The abdomen is soft and nontender.  The liver is palpable in the right upper quadrant and slightly pulsatile. The extremities reveal a large area of irregular fullness in the left inner thigh and groin area in the region of the previous excision of her sarcoma.  The pedal pulses are normal and there is no significant  peripheral edema. Strength is normal and symmetrical in all extremities.  There is no lateralizing weakness.  There are no sensory deficits.The skin is warm and dry.  There is no rash.   EKG done in the office shows atrial fibrillation with a rapid ventricular response, we'll left anterior hemiblock, and LVH with strain pattern.   Assessment / Plan: 1.  Recent onset of atrial fibrillation with rapid ventricular response 2.  severe aortic insufficiency 3.  dilatation of the ascending aortic root 4. 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 past history of polymyalgia rheumatica, inactive and she is not presently on prednisone 6. hypothyroidism on Synthroid  Plan: Admit to telemetry.  Begin IV heparin per pharmacy protocol.  Begin IV diltiazem per protocol.  We will get chest x-ray and we will update her echocardiogram.  Would consider asking cardiac surgeons to see her during this admission.  She is now willing to consider open heart surgery although the question of her sarcoma activity would need to be resolved.  Consider cardiac catheterization early next week prior to starting long-term anticoagulation.

## 2013-01-30 NOTE — Progress Notes (Addendum)
ANTICOAGULATION CONSULT NOTE - Initial Consult  Pharmacy Consult for Heparin Indication: atrial fibrillation  Allergies  Allergen Reactions  . Alendronate Sodium Nausea And Vomiting  . Lyrica (Pregabalin) Nausea And Vomiting  . Neurontin (Gabapentin) Nausea And Vomiting  . Procaine Hcl   . Toprol Xl (Metoprolol Succinate) Other (See Comments)    Does not remember     Patient Measurements:   Heparin Dosing Weight: 55 kg  Vital Signs: BP: 116/67 mmHg (03/14 1547) Pulse Rate: 116 (03/14 1547)  Labs: No results found for this basename: HGB, HCT, PLT, APTT, LABPROT, INR, HEPARINUNFRC, CREATININE, CKTOTAL, CKMB, TROPONINI,  in the last 72 hours  The CrCl is unknown because both a height and weight (above a minimum accepted value) are required for this calculation.   Medical History: Past Medical History  Diagnosis Date  . Hypothyroidism   . Heart palpitations   . 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 Dilated aortic root     Marked dilatation of aortic root per echo in 2011 with severe AI, mild to moderate MR, moderate TR  . Aortic insufficiency     severe per echo in 2012  . Polymyalgia rheumatica   . Vertigo   . Peripheral neuropathy   . Intracranial aneurysm     in late 1970's  . Osteoporosis   . Rheumatic heart disease   . Heart murmur   . Hyperthyroidism   . Headache     hx migraines  . Anxiety   . Dysrhythmia   . Fibromyalgia     Medications:  Scheduled:  . aspirin EC  81 mg Oral Daily  . furosemide  20 mg Oral Daily  . [START ON 01/31/2013] levothyroxine  75 mcg Oral QAC breakfast  . lisinopril  2.5 mg Oral Daily  . metoprolol tartrate  12.5 mg Oral BID  . sodium chloride  3 mL Intravenous Q12H  . [DISCONTINUED] sodium chloride  3 mL Intravenous Q12H    Assessment: 79 yo female admitted with Afib w/ RVR.  Pharmacy asked to begin anticoagulation with IV heparin.   Baseline labs WNL.  Per patient no history of  bleeding and no anticoagulants PTA other than aspirin.  Goal of Therapy:  Heparin level 0.3-0.7 units/ml Monitor platelets by anticoagulation protocol: Yes   Plan:  1. Start IV heparin with a bolus of 3000 units x 1. 2. Then start heparin gtt at 750 units/hr. 3. Check a heparin level 8 hrs after heparin starts. 4. Daily heparin level and CBC. 5. F/U plans for cath lab.  Tad Moore, BCPS  Clinical Pharmacist Pager [phone]  01/30/2013 7:08 PM

## 2013-01-31 ENCOUNTER — Encounter (HOSPITAL_COMMUNITY): Payer: Self-pay | Admitting: *Deleted

## 2013-01-31 DIAGNOSIS — I359 Nonrheumatic aortic valve disorder, unspecified: Secondary | ICD-10-CM

## 2013-01-31 DIAGNOSIS — I5033 Acute on chronic diastolic (congestive) heart failure: Secondary | ICD-10-CM

## 2013-01-31 DIAGNOSIS — I4891 Unspecified atrial fibrillation: Principal | ICD-10-CM

## 2013-01-31 LAB — CBC
MCH: 32 pg (ref 26.0–34.0)
MCHC: 34.2 g/dL (ref 30.0–36.0)
MCV: 93.7 fL (ref 78.0–100.0)
Platelets: 161 10*3/uL (ref 150–400)
RDW: 14 % (ref 11.5–15.5)

## 2013-01-31 LAB — BASIC METABOLIC PANEL
BUN: 15 mg/dL (ref 6–23)
CO2: 31 mEq/L (ref 19–32)
Calcium: 9.4 mg/dL (ref 8.4–10.5)
Creatinine, Ser: 0.76 mg/dL (ref 0.50–1.10)
GFR calc non Af Amer: 76 mL/min — ABNORMAL LOW (ref >90)
Glucose, Bld: 104 mg/dL — ABNORMAL HIGH (ref 70–99)
Sodium: 142 mEq/L (ref 135–145)

## 2013-01-31 LAB — HEPARIN LEVEL (UNFRACTIONATED): Heparin Unfractionated: 0.34 IU/mL (ref 0.30–0.70)

## 2013-01-31 MED ORDER — OFF THE BEAT BOOK
Freq: Once | Status: AC
  Administered 2013-01-31: 07:00:00
  Filled 2013-01-31: qty 1

## 2013-01-31 MED ORDER — DILTIAZEM HCL 30 MG PO TABS
30.0000 mg | ORAL_TABLET | Freq: Four times a day (QID) | ORAL | Status: DC
  Administered 2013-01-31 – 2013-02-04 (×13): 30 mg via ORAL
  Filled 2013-01-31 (×19): qty 1

## 2013-01-31 MED ORDER — LIVING BETTER WITH HEART FAILURE BOOK
Freq: Once | Status: AC
  Administered 2013-01-31: 06:00:00
  Filled 2013-01-31: qty 1

## 2013-01-31 NOTE — Progress Notes (Signed)
ANTICOAGULATION CONSULT NOTE - Follow Up Consult  Pharmacy Consult for heparin Indication: atrial fibrillation  Labs:  Recent Labs  01/30/13 1930 01/31/13 0210  HGB 14.1 13.8  HCT 40.0 40.4  PLT 160 161  APTT 30  --   LABPROT 13.1  --   INR 1.00  --   HEPARINUNFRC  --  0.34  CREATININE 0.65 0.76  TROPONINI <0.30  --     Assessment/Plan:  79yo female therapeutic on heparin with initial dosing for Afib.  Will continue gtt at current rate and confirm stable with additional level.  Vernard Gambles, PharmD, BCPS  01/31/2013,4:14 AM

## 2013-01-31 NOTE — Progress Notes (Signed)
Patient Name: Kayla Chambers      SUBJECTIVE: Admitted with atrial fibrillation as a new problem resulting in congestive symptoms. She has known severe AI dilated aortic root. She also has a history of a sarcoma and apparently there is some question about recurrence.  She was admitted yesterday with initiation of anticoagulation. The question was raised as to catheterization and so we will defer consideration of cardioversion until after that time. She would need to undergo repeat cardioversion TEE. Perhaps a NOAC could be reinitiated following her catheterization to simplify her regime.  Feeling much better following diuresis. Her heart rate slowed down.  Past Medical History  Diagnosis Date  . Hypothyroidism   . Heart palpitations   . 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 Dilated aortic root     Marked dilatation of aortic root per echo in 2011 with severe AI, mild to moderate MR, moderate TR  . Aortic insufficiency     severe per echo in 2012  . Polymyalgia rheumatica   . Vertigo   . Peripheral neuropathy   . Intracranial aneurysm     in late 1970's  . Osteoporosis   . Rheumatic heart disease   . Heart murmur   . Hyperthyroidism   . Headache     hx migraines  . Anxiety   . Dysrhythmia   . Fibromyalgia   . Dilated aortic root 03/06/2011    PHYSICAL EXAM Filed Vitals:   01/30/13 2059 01/31/13 0000 01/31/13 0400 01/31/13 0946  BP: 131/61 113/71 116/70 94/53  Pulse: 105 73 74 67  Temp: 98 F (36.7 C) 98.1 F (36.7 C) 98.6 F (37 C)   TempSrc: Oral Oral Oral   Resp: 18 18 18    Height:      Weight:   114 lb 14.4 oz (52.118 kg)   SpO2: 96% 94% 96%    Well developed and nourished in no acute distress HENT normal Neck supple with JVP-7-8 cm Carotids brisk and full without bruits Clear Irregularly irregular rate and rhythm with controlled ventricular response 2/6 systolic murmur 3/6 and diastolic murmur Abd-soft with active BS  without hepatomegaly No Clubbing cyanosis edema Skin-warm and dry A & Oriented  Grossly normal sensory and motor function  TELEMETRY: Reviewed telemetry pt in atrial fibrillation with controlled ventricular response:    Intake/Output Summary (Last 24 hours) at 01/31/13 1156 Last data filed at 01/31/13 0900  Gross per 24 hour  Intake    240 ml  Output      0 ml  Net    240 ml    LABS: Basic Metabolic Panel:  Recent Labs Lab 01/30/13 1930 01/31/13 0210  NA 137 142  K 4.0 4.4  CL 101 103  CO2 25 31  GLUCOSE 111* 104*  BUN 16 15  CREATININE 0.65 0.76  CALCIUM 9.7 9.4  MG 2.1  --    Cardiac Enzymes:  Recent Labs  01/30/13 1930  TROPONINI <0.30   CBC:  Recent Labs Lab 01/30/13 1930 01/31/13 0210  WBC 6.3 5.9  NEUTROABS 4.1  --   HGB 14.1 13.8  HCT 40.0 40.4  MCV 93.2 93.7  PLT 160 161   PROTIME:  Recent Labs  01/30/13 1930  LABPROT 13.1  INR 1.00   Liver Function Tests:  Recent Labs  01/30/13 1930  AST 35  ALT 60*  ALKPHOS 74  BILITOT 0.3  PROT 6.3  ALBUMIN 3.6  No results found for this basename: LIPASE, AMYLASE,  in the last 72 hours BNP: BNP (last 3 results)  Recent Labs  01/30/13 1930  PROBNP 5230.0*   D-Dimer: No results found for this basename: DDIMER,  in the last 72 hours Hemoglobin A1C: No results found for this basename: HGBA1C,  in the last 72 hours Fasting Lipid Panel: No results found for this basename: CHOL, HDL, LDLCALC, TRIG, CHOLHDL, LDLDIRECT,  in the last 72 hours Thyroid Function Tests:  Recent Labs  01/30/13 1930  TSH 2.187     ASSESSMENT AND PLAN:  Active Problems:   Aortic insufficiency   Atrial fibrillation   Acute on chronic diastolic heart failure  We will continue diuresis. She was remained on heparin. Will anticipate catheterization on Monday per Dr. Elpidio Anis note. We will decide finally on Monday. We have implications as relates to cardioversion.  As relates to surgery, her age and the  unknown status of her sarcoma I suspect we'll plan part in this. Signed, Sherryl Manges MD  01/31/2013

## 2013-01-31 NOTE — Progress Notes (Signed)
  Echocardiogram 2D Echocardiogram has been performed.  Ellender Hose A 01/31/2013, 11:41 AM

## 2013-01-31 NOTE — Progress Notes (Signed)
ANTICOAGULATION CONSULT NOTE - Follow Up Consult  Pharmacy Consult for heparin Indication: atrial fibrillation  Labs:  Recent Labs  01/30/13 1930 01/31/13 0210 01/31/13 0938  HGB 14.1 13.8  --   HCT 40.0 40.4  --   PLT 160 161  --   APTT 30  --   --   LABPROT 13.1  --   --   INR 1.00  --   --   HEPARINUNFRC  --  0.34 0.19*  CREATININE 0.65 0.76  --   TROPONINI <0.30  --   --     Assessment/Plan:  79yo female started on heparin from Afib.  Heparin drip rate 750uts/hr gave initial HL 0.34 at goal but on follow up check HL 0.19 .  CBC stable, no bleeding noted.    Plan Increase Heparin drip 850 uts/hr Daily CBC, heparin level  Leota Sauers Pharm.D. CPP, BCPS Clinical Pharmacist [phone] 01/31/2013 1:15 PM

## 2013-02-01 LAB — BASIC METABOLIC PANEL
BUN: 16 mg/dL (ref 6–23)
CO2: 26 mEq/L (ref 19–32)
Glucose, Bld: 95 mg/dL (ref 70–99)
Potassium: 3.6 mEq/L (ref 3.5–5.1)
Sodium: 141 mEq/L (ref 135–145)

## 2013-02-01 LAB — CBC
Hemoglobin: 13.9 g/dL (ref 12.0–15.0)
MCH: 32.4 pg (ref 26.0–34.0)
MCHC: 34.6 g/dL (ref 30.0–36.0)
MCV: 93.7 fL (ref 78.0–100.0)
RBC: 4.29 MIL/uL (ref 3.87–5.11)

## 2013-02-01 MED ORDER — DIAZEPAM 5 MG PO TABS: 5.0000 mg | ORAL_TABLET | ORAL | Status: DC

## 2013-02-01 MED ORDER — DIAZEPAM 5 MG PO TABS
5.0000 mg | ORAL_TABLET | ORAL | Status: AC
  Administered 2013-02-02: 5 mg via ORAL
  Filled 2013-02-01: qty 1

## 2013-02-01 MED ORDER — SODIUM CHLORIDE 0.9 % IV SOLN
1.0000 mL/kg/h | INTRAVENOUS | Status: DC
  Administered 2013-02-02: 1 mL/kg/h via INTRAVENOUS

## 2013-02-01 NOTE — Progress Notes (Signed)
Pt having some pauses of 2.38 and smaller pauses while sleeping. Pt asymptomatic. Strips posted and pt checked. Pts hr also decreased to about 39 nonsustained while sleeping and was asymptomatic. Will cont to monitor.

## 2013-02-01 NOTE — Progress Notes (Signed)
Patient Name: Kayla Chambers      SUBJECTIVE: Admitted with atrial fibrillation as a new problem resulting in congestive symptoms. She has known severe AI dilated aortic root. She also has a history of a sarcoma and apparently there is some question about recurrence.  She was admitted yesterday with initiation of anticoagulation. The question was raised as to catheterization and so we will defer consideration of cardioversion until after that time. She would need to undergo repeat cardioversion TEE. Perhaps a NOAC could be reinitiated following her catheterization to simplify her regime.  Feeling much better following diuresis. Her heart rate has slowed down  For possible cath in am  Past Medical History  Diagnosis Date  . Hypothyroidism   . Heart palpitations   . 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 Dilated aortic root     Marked dilatation of aortic root per echo in 2011 with severe AI, mild to moderate MR, moderate TR  . Aortic insufficiency     severe per echo in 2012  . Polymyalgia rheumatica   . Vertigo   . Peripheral neuropathy   . Intracranial aneurysm     in late 1970's  . Osteoporosis   . Rheumatic heart disease   . Heart murmur   . Hyperthyroidism   . Headache     hx migraines  . Anxiety   . Dysrhythmia   . Fibromyalgia   . Dilated aortic root 03/06/2011    PHYSICAL EXAM Filed Vitals:   01/31/13 1952 01/31/13 2131 01/31/13 2335 02/01/13 0500  BP: 90/52 97/53 111/67 109/62  Pulse: 68 82 87 79  Temp: 97.9 F (36.6 C)   97.9 F (36.6 C)  TempSrc: Oral     Resp: 18   18  Height:      Weight:    118 lb 8 oz (53.751 kg)  SpO2: 97%   96%   Well developed and nourished in no acute distress HENT normal Neck supple with JVP-7-8 cm Carotids brisk and full without bruits Clear Irregularly irregular rate and rhythm with controlled ventricular response 2/6 systolic murmur 3/6 and diastolic murmur Abd-soft with active BS  without hepatomegaly No Clubbing cyanosis edema Skin-warm and dry A & Oriented  Grossly normal sensory and motor function  TELEMETRY: Reviewed telemetry pt in atrial fibrillation with variable ventricular response:    Intake/Output Summary (Last 24 hours) at 02/01/13 1046 Last data filed at 01/31/13 1800  Gross per 24 hour  Intake 311.83 ml  Output    150 ml  Net 161.83 ml    LABS: Basic Metabolic Panel:  Recent Labs Lab 01/30/13 1930 01/31/13 0210 02/01/13 0515  NA 137 142 141  K 4.0 4.4 3.6  CL 101 103 103  CO2 25 31 26   GLUCOSE 111* 104* 95  BUN 16 15 16   CREATININE 0.65 0.76 0.71  CALCIUM 9.7 9.4 8.9  MG 2.1  --   --    Cardiac Enzymes:  Recent Labs  01/30/13 1930  TROPONINI <0.30   CBC:  Recent Labs Lab 01/30/13 1930 01/31/13 0210 02/01/13 0515  WBC 6.3 5.9 5.5  NEUTROABS 4.1  --   --   HGB 14.1 13.8 13.9  HCT 40.0 40.4 40.2  MCV 93.2 93.7 93.7  PLT 160 161 154   PROTIME:  Recent Labs  01/30/13 1930  LABPROT 13.1  INR 1.00   Liver Function Tests:  Recent Labs  01/30/13 1930  AST 35  ALT 60*  ALKPHOS 74  BILITOT 0.3  PROT 6.3  ALBUMIN 3.6   No results found for this basename: LIPASE, AMYLASE,  in the last 72 hours BNP: BNP (last 3 results)  Recent Labs  01/30/13 1930  PROBNP 5230.0*   D-Dimer: No results found for this basename: DDIMER,  in the last 72 hours Hemoglobin A1C: No results found for this basename: HGBA1C,  in the last 72 hours Fasting Lipid Panel: No results found for this basename: CHOL, HDL, LDLCALC, TRIG, CHOLHDL, LDLDIRECT,  in the last 72 hours Thyroid Function Tests:  Recent Labs  01/30/13 1930  TSH 2.187     ASSESSMENT AND PLAN:  Active Problems:   Aortic insufficiency   Sarcoma   Atrial fibrillation   Acute on chronic diastolic heart failure  We will continue diuresis. She was remained on heparin. Will anticipate catheterization on Monday per Dr. Darlyne Russian note. We will decide finally on  Monday. Will have implications as relates to cardioversion.  Will hold off on rate control changes as blood pressure is low and HR not too bad  As relates to surgery, her age and the unknown status of her sarcoma I suspect we'll plan part in this.  Reviewed repeatedly today, not sure she understands completely Signed, Sherryl Manges MD  02/01/2013

## 2013-02-01 NOTE — Progress Notes (Signed)
Utilization Review Completed.Kayla Chambers T3/16/2014  

## 2013-02-01 NOTE — Progress Notes (Signed)
ANTICOAGULATION CONSULT NOTE - Follow Up Consult  Pharmacy Consult for heparin Indication: atrial fibrillation  Labs:  Recent Labs  01/30/13 1930 01/31/13 0210 01/31/13 0938 02/01/13 0515  HGB 14.1 13.8  --  13.9  HCT 40.0 40.4  --  40.2  PLT 160 161  --  154  APTT 30  --   --   --   LABPROT 13.1  --   --   --   INR 1.00  --   --   --   HEPARINUNFRC  --  0.34 0.19* 0.47  CREATININE 0.65 0.76  --  0.71  TROPONINI <0.30  --   --   --     Assessment/Plan:  79yo female started on heparin for Afib CVR on diltiazem and metoprolol .  Heparin drip rate 850uts/hr HL 0.47 .  CBC stable, no bleeding noted.    Plan Continue Heparin drip 850 uts/hr Daily CBC, heparin level  Leota Sauers Pharm.D. CPP, BCPS Clinical Pharmacist [phone] 02/01/2013 10:49 AM

## 2013-02-02 ENCOUNTER — Encounter (HOSPITAL_COMMUNITY)
Admission: AD | Disposition: A | Discharge status: Home / Self Care | Payer: Self-pay | Source: Ambulatory Visit | Attending: Cardiology

## 2013-02-02 DIAGNOSIS — I359 Nonrheumatic aortic valve disorder, unspecified: Secondary | ICD-10-CM

## 2013-02-02 DIAGNOSIS — I2510 Atherosclerotic heart disease of native coronary artery without angina pectoris: Secondary | ICD-10-CM

## 2013-02-02 HISTORY — PX: LEFT AND RIGHT HEART CATHETERIZATION WITH CORONARY ANGIOGRAM: SHX5449

## 2013-02-02 LAB — POCT I-STAT 3, ART BLOOD GAS (G3+): pH, Arterial: 7.411 (ref 7.350–7.450)

## 2013-02-02 LAB — POCT I-STAT 3, VENOUS BLOOD GAS (G3P V)
Acid-base deficit: 2 mmol/L (ref 0.0–2.0)
Bicarbonate: 23.8 mEq/L (ref 20.0–24.0)
Bicarbonate: 25.1 mEq/L — ABNORMAL HIGH (ref 20.0–24.0)
TCO2: 26 mmol/L (ref 0–100)

## 2013-02-02 LAB — BASIC METABOLIC PANEL
BUN: 16 mg/dL (ref 6–23)
CO2: 25 mEq/L (ref 19–32)
Calcium: 8.9 mg/dL (ref 8.4–10.5)
Chloride: 107 mEq/L (ref 96–112)
Creatinine, Ser: 0.61 mg/dL (ref 0.50–1.10)
Glucose, Bld: 101 mg/dL — ABNORMAL HIGH (ref 70–99)

## 2013-02-02 LAB — CBC
HCT: 37.8 % (ref 36.0–46.0)
MCH: 32.2 pg (ref 26.0–34.0)
MCHC: 34.9 g/dL (ref 30.0–36.0)
MCV: 92.2 fL (ref 78.0–100.0)
RDW: 14.4 % (ref 11.5–15.5)

## 2013-02-02 LAB — POCT ACTIVATED CLOTTING TIME: Activated Clotting Time: 154 seconds

## 2013-02-02 SURGERY — LEFT AND RIGHT HEART CATHETERIZATION WITH CORONARY ANGIOGRAM
Laterality: Right

## 2013-02-02 MED ORDER — HEPARIN (PORCINE) IN NACL 2-0.9 UNIT/ML-% IJ SOLN
INTRAMUSCULAR | Status: AC
  Filled 2013-02-02: qty 1000

## 2013-02-02 MED ORDER — MIDAZOLAM HCL 2 MG/2ML IJ SOLN
INTRAMUSCULAR | Status: AC
  Filled 2013-02-02: qty 2

## 2013-02-02 MED ORDER — ZOLPIDEM TARTRATE 5 MG PO TABS
5.0000 mg | ORAL_TABLET | Freq: Every evening | ORAL | Status: DC | PRN
  Administered 2013-02-02: 5 mg via ORAL
  Filled 2013-02-02: qty 1

## 2013-02-02 MED ORDER — SODIUM CHLORIDE 0.9 % IV SOLN: INTRAVENOUS | Status: AC

## 2013-02-02 MED ORDER — LIDOCAINE HCL (PF) 1 % IJ SOLN
INTRAMUSCULAR | Status: AC
  Filled 2013-02-02: qty 30

## 2013-02-02 NOTE — Interval H&P Note (Signed)
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    The various methods of treatment have been discussed with the patient. After consideration of risks, benefits and other options for treatment, the patient has consented to  Procedure(s): LEFT HEART CATHETERIZATION WITH CORONARY ANGIOGRAM (N/A) and right heart cath as a surgical intervention .  The patient's history has been reviewed, patient examined, no change in status, stable for surgery.  I have reviewed the patient's chart and labs.  Questions were answered to the patient's satisfaction.   I discussed the case with Dr. Patty Sermons before proceeding.  He agrees that RHC is needed given valvular issue for decision making.      Shawnie Pons

## 2013-02-02 NOTE — H&P (View-Only) (Signed)
 Subjective:  Patient has had an uneventful weekend. No chest pain. Dyspnea is improved with rate control of her atrial fib. For cath today.  Objective:  Vital Signs in the last 24 hours: Temp:  [97.5 F (36.4 C)-97.9 F (36.6 C)] 97.8 F (36.6 C) (03/17 0500) Pulse Rate:  [82-97] 83 (03/17 0500) Resp:  [16-20] 16 (03/17 0500) BP: (113-121)/(54-63) 121/54 mmHg (03/17 0500) SpO2:  [96 %-100 %] 96 % (03/17 0500) Weight:  [118 lb (53.524 kg)] 118 lb (53.524 kg) (03/17 0500)  Intake/Output from previous day: 03/16 0701 - 03/17 0700 In: 1312.3 [P.O.:720; I.V.:592.3] Out: -  Intake/Output from this shift:    . aspirin EC  81 mg Oral Daily  . diazepam  5 mg Oral On Call  . diltiazem  30 mg Oral Q6H  . furosemide  20 mg Oral Daily  . levothyroxine  75 mcg Oral QAC breakfast  . lisinopril  2.5 mg Oral Daily  . metoprolol tartrate  12.5 mg Oral BID  . sodium chloride  3 mL Intravenous Q12H   . sodium chloride 1 mL/kg/hr (02/02/13 0400)  . heparin 850 Units/hr (01/31/13 2200)    Physical Exam: The patient appears to be in no distress.  the general appearance reveals an elderly woman in no acute distress. Head and neck exam reveal that the pupils are equal round reactive to light and accommodation there is no scleral icterus. Mouth and pharynx are normal. No lymphadenopathy. She does have elevated jugular venous pressure. Carotids reveal no bruits and the carotid upstroke is bounding consistent with her severe aortic insufficiency  The chest is clear to percussion and auscultation. The heart reveals a hyperdynamic precordium with an irregular rapid heart action and grade 2-3 diastolic aortic murmur of aortic insufficiency. No S3 gallop  The abdomen is soft and nontender. The liver is palpable in the right upper quadrant and slightly pulsatile.  The extremities reveal a large area of irregular fullness in the left inner thigh and groin area in the region of the previous excision of  her sarcoma. The pedal pulses are normal and there is no significant peripheral edema.  Strength is normal and symmetrical in all extremities. There is no lateralizing weakness. There are no sensory deficits.The skin is warm and dry. There is no rash.  Lab Results:  Recent Labs  02/01/13 0515 02/02/13 0500  WBC 5.5 4.9  HGB 13.9 13.2  PLT 154 152    Recent Labs  02/01/13 0515 02/02/13 0500  NA 141 141  K 3.6 3.7  CL 103 107  CO2 26 25  GLUCOSE 95 101*  BUN 16 16  CREATININE 0.71 0.61    Recent Labs  01/30/13 1930  TROPONINI <0.30   Hepatic Function Panel  Recent Labs  01/30/13 1930  PROT 6.3  ALBUMIN 3.6  AST 35  ALT 60*  ALKPHOS 74  BILITOT 0.3   No results found for this basename: CHOL,  in the last 72 hours No results found for this basename: PROTIME,  in the last 72 hours  Imaging: Imaging results have been reviewed  Cardiac Studies: 2D echo 01/31/13 Left ventricle: The cavity size was moderately dilated. Wall thickness was normal. Systolic function was normal. The estimated ejection fraction was in the range of 50% to 55%. Wall motion was normal; there were no regional wall motion abnormalities. - Aortic valve: Moderate regurgitation.   Assessment/Plan:  1. Recent onset of atrial fibrillation with rapid ventricular response, now rate controlled  2.   severe aortic insufficiency  3. dilatation of the ascending aortic root  4. 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 past history of polymyalgia rheumatica, inactive and she is not presently on prednisone  6. hypothyroidism on Synthroid  Plan:  Cath today.   LOS: 3 days    Carlethia Mesquita 02/02/2013, 8:08 AM    

## 2013-02-02 NOTE — Progress Notes (Signed)
Subjective:  Patient has had an uneventful weekend. No chest pain. Dyspnea is improved with rate control of her atrial fib. For cath today.  Objective:  Vital Signs in the last 24 hours: Temp:  [97.5 F (36.4 C)-97.9 F (36.6 C)] 97.8 F (36.6 C) (03/17 0500) Pulse Rate:  [82-97] 83 (03/17 0500) Resp:  [16-20] 16 (03/17 0500) BP: (113-121)/(54-63) 121/54 mmHg (03/17 0500) SpO2:  [96 %-100 %] 96 % (03/17 0500) Weight:  [118 lb (53.524 kg)] 118 lb (53.524 kg) (03/17 0500)  Intake/Output from previous day: 03/16 0701 - 03/17 0700 In: 1312.3 [P.O.:720; I.V.:592.3] Out: -  Intake/Output from this shift:    . aspirin EC  81 mg Oral Daily  . diazepam  5 mg Oral On Call  . diltiazem  30 mg Oral Q6H  . furosemide  20 mg Oral Daily  . levothyroxine  75 mcg Oral QAC breakfast  . lisinopril  2.5 mg Oral Daily  . metoprolol tartrate  12.5 mg Oral BID  . sodium chloride  3 mL Intravenous Q12H   . sodium chloride 1 mL/kg/hr (02/02/13 0400)  . heparin 850 Units/hr (01/31/13 2200)    Physical Exam: The patient appears to be in no distress.  the general appearance reveals an elderly woman in no acute distress. Head and neck exam reveal that the pupils are equal round reactive to light and accommodation there is no scleral icterus. Mouth and pharynx are normal. No lymphadenopathy. She does have elevated jugular venous pressure. Carotids reveal no bruits and the carotid upstroke is bounding consistent with her severe aortic insufficiency  The chest is clear to percussion and auscultation. The heart reveals a hyperdynamic precordium with an irregular rapid heart action and grade 2-3 diastolic aortic murmur of aortic insufficiency. No S3 gallop  The abdomen is soft and nontender. The liver is palpable in the right upper quadrant and slightly pulsatile.  The extremities reveal a large area of irregular fullness in the left inner thigh and groin area in the region of the previous excision of  her sarcoma. The pedal pulses are normal and there is no significant peripheral edema.  Strength is normal and symmetrical in all extremities. There is no lateralizing weakness. There are no sensory deficits.The skin is warm and dry. There is no rash.  Lab Results:  Recent Labs  02/01/13 0515 02/02/13 0500  WBC 5.5 4.9  HGB 13.9 13.2  PLT 154 152    Recent Labs  02/01/13 0515 02/02/13 0500  NA 141 141  K 3.6 3.7  CL 103 107  CO2 26 25  GLUCOSE 95 101*  BUN 16 16  CREATININE 0.71 0.61    Recent Labs  01/30/13 1930  TROPONINI <0.30   Hepatic Function Panel  Recent Labs  01/30/13 1930  PROT 6.3  ALBUMIN 3.6  AST 35  ALT 60*  ALKPHOS 74  BILITOT 0.3   No results found for this basename: CHOL,  in the last 72 hours No results found for this basename: PROTIME,  in the last 72 hours  Imaging: Imaging results have been reviewed  Cardiac Studies: 2D echo 01/31/13 Left ventricle: The cavity size was moderately dilated. Wall thickness was normal. Systolic function was normal. The estimated ejection fraction was in the range of 50% to 55%. Wall motion was normal; there were no regional wall motion abnormalities. - Aortic valve: Moderate regurgitation.   Assessment/Plan:  1. Recent onset of atrial fibrillation with rapid ventricular response, now rate controlled  2.  severe aortic insufficiency  3. dilatation of the ascending aortic root  4. 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 past history of polymyalgia rheumatica, inactive and she is not presently on prednisone  6. hypothyroidism on Synthroid  Plan:  Cath today.   LOS: 3 days    Cassell Clement 02/02/2013, 8:08 AM

## 2013-02-02 NOTE — CV Procedure (Signed)
   Cardiac Catheterization Procedure Note  Name: Kayla Chambers MRN: [id number] DOB: [birth year]  Procedure: Right Heart Cath, Left Heart Cath, Selective Coronary Angiography, LV angiography, aortic root angiography  Indication: Patient with new onset of atrial fibrillation. Known severe AI   Procedural Details: The right groin was prepped, draped, and anesthetized with 1% lidocaine. Using the modified Seldinger technique a 5 French sheath was placed in the right femoral artery and a 7 French sheath was placed in the right femoral vein. A Swan-Ganz catheter was used for the right heart catheterization. Standard protocol was followed for recording of right heart pressures and sampling of oxygen saturations. Fick cardiac output was calculated. Standard Judkins catheters were used for selective coronary angiography and left ventriculography.  Proximal root aortography was performed without complication.   There were no immediate procedural complications. The patient was transferred to the post catheterization recovery area for further monitoring.  Procedural Findings: Hemodynamics RA 5 RV 25/5 PA 26/14 PCWP 15 LV 130/13 AO 129/65 (89)f  Oxygen saturations: SVC  55% PA 63% AO 96%  Cardiac Output (Fick) 2.87 L/min/m2  Cardiac Index (Fick) 1.81 L/min/m2 Cardiac Output (Thermo) 2.92 L/min Cardiac Index (Thermo)  1.84 L/min/m2    Coronary angiography: Coronary dominance: right  Left mainstem: Short and without obstruction  Left anterior descending (LAD): Moderate calcification with about 30% luminal irregularity proximally. No critical obstruction.  The vessel terminates as an apical LAD and large distal diagonal.    Left circumflex (LCx): Consists mainly of a large OM branch.  There is about 40% segmental plaquing just beyond the takeoff of the small AV circumflex.    Right coronary artery (RCA): Large caliber vessel with 30% proximal narrowing that is eccentric.  There is a large  caliber PDA and smaller PDA, both free of major disease.   Left ventriculography: There was ectopy with LV angiography, so assessment of LV function is not reliable.  EF estimate would be 50%, but unreliable.   Aortography reveals a very large aortic root down involving the sinus of valsalva.  There is mild calcification of the valve.  There is moderately severe aortic regurgitation  (3 plus) noted.     Final Conclusions:   1.  Aortic regurgitation, moderately severe 2.  Dilated LV with likely reduction in LV function 3.  Recent onset of atrial fibrillation 4.  Moderate coronary calcification, without critical obstruction.  Recommendations:  1.  Defer surgical decisions to patient with Dr Patty Sermons. 2.  Rate control. 3.  Given AI and wide pulse pressure, and very small size will defer reanticoagulation to am.  Would consider low dose heparin for 1-2 days before consideration a NOAC given AI and risk of bleeding.     Shawnie Pons MD, [facility], FSCAI 02/02/2013, 7:00 PM

## 2013-02-02 NOTE — Progress Notes (Signed)
ANTICOAGULATION CONSULT NOTE - Follow Up Consult  Pharmacy Consult for Heparin Indication: chest pain/ACS and atrial fibrillation  Allergies  Allergen Reactions  . Alendronate Sodium Nausea And Vomiting  . Lyrica (Pregabalin) Nausea And Vomiting  . Neurontin (Gabapentin) Nausea And Vomiting  . Procaine Hcl   . Toprol Xl (Metoprolol Succinate) Other (See Comments)    Does not remember     Patient Measurements: Height: 5\' 5"  (165.1 cm) Weight: 118 lb (53.524 kg) IBW/kg (Calculated) : 57 Heparin Dosing Weight: 53.5kg  Vital Signs: Temp: 97.8 F (36.6 C) (03/17 0500) BP: 108/75 mmHg (03/17 0938) Pulse Rate: 85 (03/17 0932)  Labs:  Recent Labs  01/30/13 1930  01/31/13 0210 01/31/13 0938 02/01/13 0515 02/02/13 0500  HGB 14.1  --  13.8  --  13.9 13.2  HCT 40.0  --  40.4  --  40.2 37.8  PLT 160  --  161  --  154 152  APTT 30  --   --   --   --   --   LABPROT 13.1  --   --   --   --   --   INR 1.00  --   --   --   --   --   HEPARINUNFRC  --   < > 0.34 0.19* 0.47 0.46  CREATININE 0.65  --  0.76  --  0.71 0.61  TROPONINI <0.30  --   --   --   --   --   < > = values in this interval not displayed.  Estimated Creatinine Clearance: 45 ml/min (by C-G formula based on Cr of 0.61).   Medications:  Scheduled:  . aspirin EC  81 mg Oral Daily  . diazepam  5 mg Oral On Call  . diltiazem  30 mg Oral Q6H  . furosemide  20 mg Oral Daily  . levothyroxine  75 mcg Oral QAC breakfast  . lisinopril  2.5 mg Oral Daily  . metoprolol tartrate  12.5 mg Oral BID  . sodium chloride  3 mL Intravenous Q12H  . [DISCONTINUED] diazepam  5 mg Oral On Call    Assessment: 79 year old female started on heparin for atrial fibrillation with RVR. Rate now controlled. Plan for cath today (3/17).   Heparin level is therapeutic at 0.46. H/H 13.2/37.8 and plts 152. No evidence of bleeding at this time.   Goal of Therapy:  Heparin level 0.3-0.7 units/ml Monitor platelets by anticoagulation  protocol: Yes   Plan:  Continue heparin drip at 850 units/hr  Daily heparin level and CBC  F/u after Cath today (3/17)  Micheline Chapman PharmD Candidate  02/02/2013,10:34 AM  Agree with Rebecca's assessment and plan. Dorna Leitz, PharmD, BCPS

## 2013-02-02 NOTE — Care Management Note (Signed)
    Page 1 of 1   02/03/2013     10:53:20 AM   CARE MANAGEMENT NOTE 02/03/2013  Patient:  Kayla Chambers, Kayla Chambers   Account Number:  [id number]  Date Initiated:  02/02/2013  Documentation initiated by:  GRAVES-BIGELOW,Anquanette Bahner  Subjective/Objective Assessment:   Pt admitted with a fib and plan for cath today.     Action/Plan:   CM will contineu to monitor for disposition needs.   Anticipated DC Date:  02/04/2013   Anticipated DC Plan:  HOME W HOME HEALTH SERVICES      DC Planning Services  CM consult  Medication Assistance      Choice offered to / List presented to:             Status of service:  Completed, signed off Medicare Important Message given?   (If response is "NO", the following Medicare IM given date fields will be blank) Date Medicare IM given:   Date Additional Medicare IM given:    Discharge Disposition:  HOME/SELF CARE  Per UR Regulation:  Reviewed for med. necessity/level of care/duration of stay  If discussed at Long Length of Stay Meetings, dates discussed:    Comments:  CM placed a benefits check in for possible xarelto 15 mg po daily and pt uses [facility] on [address]. CM will make pt aware once completed if this is the medication needed for home. CM did discuss HHRN with her and husband, however they feel as though they can manage at this time without [facility] services. No further needs for CM at this time.

## 2013-02-03 ENCOUNTER — Encounter (HOSPITAL_COMMUNITY): Payer: Self-pay | Admitting: Cardiothoracic Surgery

## 2013-02-03 DIAGNOSIS — I359 Nonrheumatic aortic valve disorder, unspecified: Secondary | ICD-10-CM

## 2013-02-03 DIAGNOSIS — I712 Thoracic aortic aneurysm, without rupture: Secondary | ICD-10-CM

## 2013-02-03 LAB — CBC
MCH: 31.9 pg (ref 26.0–34.0)
MCHC: 34.3 g/dL (ref 30.0–36.0)
MCV: 93.1 fL (ref 78.0–100.0)
Platelets: 144 10*3/uL — ABNORMAL LOW (ref 150–400)
RBC: 4.07 MIL/uL (ref 3.87–5.11)
RDW: 14.5 % (ref 11.5–15.5)

## 2013-02-03 MED ORDER — HEPARIN SODIUM (PORCINE) 5000 UNIT/ML IJ SOLN
5000.0000 [IU] | Freq: Three times a day (TID) | INTRAMUSCULAR | Status: DC
  Administered 2013-02-03 – 2013-02-04 (×3): 5000 [IU] via SUBCUTANEOUS
  Filled 2013-02-03 (×6): qty 1

## 2013-02-03 NOTE — Consult Note (Addendum)
[address]          [place] [address]       [phone]       [facility] Medical Record #[id number] Date of Birth: 27-Oct-1927  Referring: Dr Ronny Flurry Primary Care: [name], MD Ortho: Dr. Tamsen Meek  Chief Complaint:   SOB and new Afib   History of Present Illness:     Patient is an 79 year old female followed by Dr. Ronny Flurry. 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 She presented to the office with increasing nocturnal dyspnea orthopnea and rapid heart rate. She was found to be in new onset of atrial fibrillation and was subsequently admitted several days ago. Followup including echocardiogram and cardiac catheterization confirms significant dilatation of the aortic root( 6.2 cm by echo)  and moderate to moderately severe aortic insufficiency. She has a history of rheumatic fever at age 25. She's had no previous history of myocardial infarction. 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 Recently she has noted some increase in size of the area of the resection and and underwent MRI of the leg in December 2013. With the degree of aortic insufficiency and the size of her aortic root cardiac surgery was consulted she to review with her the treatment options of her aortic valve and dilated ascending aorta. In the past she had told Dr. Patty Sermons she was not interested in any surgical intervention but now is willing to discuss the treatment options.   Current Activity/ Functional Status: Patient is independent with mobility/ambulation, transfers, ADL's, IADL's.   Zubrod Score: At the time of surgery this patient's most appropriate activity status/level should be described as: []  Normal activity, no symptoms [x]  Symptoms, fully ambulatory []  Symptoms, in bed less than or equal to 50% of the time []  Symptoms, in bed greater than 50% of the time but less than  100% []  Bedridden []  Moribund  Past Medical History  Diagnosis Date  . Hypothyroidism   . Heart palpitations   . Dilated aortic root     Marked dilatation of aortic root per echo in 2011 with severe AI, mild to moderate MR, moderate TR  . Aortic insufficiency     severe per echo in 2012  . Polymyalgia rheumatica   . Vertigo   . Peripheral neuropathy   . Intracranial aneurysm     in late 1970's  . Osteoporosis   . Rheumatic heart disease   . Heart murmur   . Hyperthyroidism   . Headache     hx migraines  . Anxiety   . Dysrhythmia   . Fibromyalgia   . Dilated aortic root 6.2 cm with moderate AI 03/06/2011  . 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 Neoadjuvant RT by Dr. Abelardo Diesel. Surveillance plan: MRI 12/08/11 shows changes in presumed post-op seroma/hematoma with slight increase in size - follow up MRI 11/18/12 showed same size. Will continue with q3 month MRI. CT C/A/P annually alternating with CXR at 6 months.     Past Surgical History  Procedure Laterality Date  . Sarcoma resection  2011  . Abdominal hysterectomy    . Bladder suspension    . Eye surgery    .  Colonoscopy  10/25/2011    Procedure: COLONOSCOPY;  Surgeon: Charolett Bumpers, MD;  Location: [facility];  Service: Endoscopy;  Laterality: N/A;    History  Smoking status  . Never Smoker   Smokeless tobacco  . Never Used    History  Alcohol Use No      Allergies  Allergen Reactions  . Alendronate Sodium Nausea And Vomiting  . Lyrica (Pregabalin) Nausea And Vomiting  . Neurontin (Gabapentin) Nausea And Vomiting  . Procaine Hcl   . Toprol Xl (Metoprolol Succinate) Other (See Comments)    Does not remember     Current Facility-Administered Medications  Medication Dose Route Frequency Provider Last Rate Last Dose  . 0.9 %  sodium chloride infusion  250 mL Intravenous PRN  Cassell Clement, MD      . acetaminophen (TYLENOL) tablet 650 mg  650 mg Oral Q4H PRN Cassell Clement, MD      . ALPRAZolam Prudy Feeler) tablet 0.25 mg  0.25 mg Oral BID PRN Cassell Clement, MD      . aspirin EC tablet 81 mg  81 mg Oral Daily Cassell Clement, MD   81 mg at 02/03/13 0954  . diltiazem (CARDIZEM) tablet 30 mg  30 mg Oral Q6H Ok Anis, NP   30 mg at 02/03/13 4782  . furosemide (LASIX) tablet 20 mg  20 mg Oral Daily Cassell Clement, MD   20 mg at 02/03/13 0954  . heparin injection 5,000 Units  5,000 Units Subcutaneous Q8H Cassell Clement, MD      . levothyroxine (SYNTHROID, LEVOTHROID) tablet 75 mcg  75 mcg Oral QAC breakfast Cassell Clement, MD   75 mcg at 02/03/13 0954  . lisinopril (PRINIVIL,ZESTRIL) tablet 2.5 mg  2.5 mg Oral Daily Cassell Clement, MD   2.5 mg at 02/03/13 0954  . metoprolol tartrate (LOPRESSOR) tablet 12.5 mg  12.5 mg Oral BID Cassell Clement, MD   12.5 mg at 02/03/13 0954  . ondansetron (ZOFRAN) injection 4 mg  4 mg Intravenous Q6H PRN Cassell Clement, MD      . sodium chloride 0.9 % injection 3 mL  3 mL Intravenous PRN Cassell Clement, MD      . sodium chloride 0.9 % injection 3 mL  3 mL Intravenous PRN Cassell Clement, MD      . zolpidem (AMBIEN) tablet 5 mg  5 mg Oral QHS PRN Dayna N Dunn, PA-C   5 mg at 02/02/13 2146    Prescriptions prior to admission  Medication Sig Dispense Refill  . ALPRAZolam (XANAX) 0.5 MG tablet Take 0.5 tablets (0.25 mg total) by mouth at bedtime as needed. sleep  30 tablet  5  . aspirin EC 81 MG tablet Take 81 mg by mouth daily.      Marland Kitchen atenolol (TENORMIN) 25 MG tablet Take 25 mg by mouth at bedtime.       Marland Kitchen b complex vitamins tablet Take 1 tablet by mouth daily.      . Calcium Carbonate-Vitamin D (CALCIUM + D PO) Take 1 tablet by mouth 3 (three) times daily.       Marland Kitchen CRANBERRY PO Take 1 tablet by mouth 3 (three) times daily.       . fish oil-omega-3 fatty acids 1000 MG capsule Take 1 g by mouth 2 (two) times  daily.       Marland Kitchen levothyroxine (SYNTHROID, LEVOTHROID) 75 MCG tablet Take 75 mcg by mouth daily.        . multivitamin (THERAGRAN) per tablet Take  1 tablet by mouth daily.        . Probiotic Product (PROBIOTIC DAILY PO) Take 1 capsule by mouth daily.         Family History  Problem Relation Age of Onset  . Heart disease Mother   . Stroke Mother   . Arthritis Father   . Cancer Father   . Hypertension Sister   . Anesthesia problems Brother      Review of Systems:     Cardiac Review of Systems: Y or N  Chest Pain [n    ]  Resting SOB [  n ] Exertional SOB  [ y ]  Orthopnea Cove.Etienne  ]   Pedal Edema [ y  ]    Palpitations [ y ] Syncope  [ n ]   Presyncope [ n  ]  General Review of Systems: [Y] = yes [  ]=no Constitional: recent weight change [n  ]; anorexia [  ]; fatigue Cove.Etienne  ]; nausea [  ]; night sweats [  ]; fever [  ]; or chills [  ]                                                               Dental: poor dentition[y  ];   Eye : blurred vision [  ]; diplopia [   ]; vision changes [  ];  Amaurosis fugax[  ]; Resp: cough [  ];  wheezing[n  ];  hemoptysis[  ]; shortness of breath[  ]; paroxysmal nocturnal dyspnea[y  ]; dyspnea on exertion[y  ]; or orthopnea[  ];  GI:  gallstones[  ], vomiting[  ];  dysphagia[  ]; melena[  ];  hematochezia [  ]; heartburn[  ];   Hx of  Colonoscopy[y  ]; GU: kidney stones [  ]; hematuria[  ];   dysuria [  ];  nocturia[  ];  history of     obstruction [  ]; urinary frequency [  ]             Skin: rash, swelling[  ];, hair loss[  ];  peripheral edema[  ];  or itching[  ]; Musculosketetal: myalgias[  ];  joint swelling[  ];  joint erythema[  ];  joint pain[  ];  back pain[  ];  Heme/Lymph: bruising[  ];  bleeding[  ];  anemia[  ];  Neuro: TIA[ n ];  headaches[  ];  stroke[  ];  vertigo[  ];  seizures[  ];   paresthesias[  ];  difficulty walking[  ];  Psych:depression[  ]; anxiety[  ];  Endocrine: diabetes[  ];  thyroid dysfunction[  ];  Immunizations: Flu [ y ];  Pneumococcal[ n ];  Other:  Physical Exam: BP 121/75  Pulse 90  Temp(Src) 97.3 F (36.3 C) (Axillary)  Resp 18  Ht 5\' 5"  (1.651 m)  Wt 114 lb (51.71 kg)  BMI 18.97 kg/m2  SpO2 97%  General appearance: alert, cooperative and appears stated age Neurologic: intact Heart: irregularly irregular rhythm and diastolic murmur: holodiastolic 3/6, crescendo at lower left sternal border Lungs: clear to auscultation bilaterally Abdomen: soft, non-tender; bowel sounds normal; no masses,  no organomegaly Extremities: extremities normal, atraumatic, no cyanosis or edema, edema left lower leg and cath site rt  groin without hematoma Wound: left inner thigh scar or sarcoma resection and some superior swelling. skin discoloation due to radiation   Diagnostic Studies & Laboratory data:   ECHO: ------------------------------------------------------------ LV EF: 50% - 55%  ------------------------------------------------------------ Indications: Atrial fibrillation - 427.31.  ------------------------------------------------------------ History: PMH: AI  Dialated Aortic Root Dizziness  Atrial fibrillation.  ------------------------------------------------------------ Study Conclusions  - Left ventricle: The cavity size was moderately dilated. Wall thickness was normal. Systolic function was normal. The estimated ejection fraction was in the range of 50% to 55%. Wall motion was normal; there were no regional wall motion abnormalities. - Aortic valve: Moderate regurgitation. Transthoracic echocardiography. M-mode, complete 2D, spectral Doppler, and color Doppler. Height: Height: 165.1cm. Height: 65in. Weight: Weight: 52.1kg. Weight: 114.6lb. Body mass index: BMI: 19.1kg/m^2. Body surface area: BSA: 1.25m^2. Blood pressure: 94/53. Patient status: Inpatient. Location:  Bedside.  ------------------------------------------------------------  ------------------------------------------------------------ Left ventricle: The cavity size was moderately dilated. Wall thickness was normal. Systolic function was normal. The estimated ejection fraction was in the range of 50% to 55%. Wall motion was normal; there were no regional wall motion abnormalities.  ------------------------------------------------------------ Aortic valve: Doppler: Moderate regurgitation.  ------------------------------------------------------------ Aorta: There was severe dilation of the sinus(es) of Valsalva.  ------------------------------------------------------------ Mitral valve: Structurally normal valve. Doppler: Trivial regurgitation.  ------------------------------------------------------------ Left atrium: The atrium was normal in size.  ------------------------------------------------------------ Right ventricle: The cavity size was normal. Systolic function was normal.  ------------------------------------------------------------ Pulmonic valve: Doppler: Trivial regurgitation.  ------------------------------------------------------------ Tricuspid valve: The valve appears to be grossly normal. Doppler: Trivial regurgitation.  ------------------------------------------------------------ Right atrium: The atrium was normal in size.  ------------------------------------------------------------ Pericardium: There was no pericardial effusion.  ------------------------------------------------------------  2D measurements Normal Doppler measurements Normal Left ventricle Left ventricle LVID ED, 52.4 mm 43-52 Ea, med ann, 5.04 cm/s ------ chord, tiss DP PLAX Aortic valve LVID ES, 34.1 mm 23-38 Regurg PHT 337 ms ------ chord, Tricuspid valve PLAX Regurg peak 225 cm/s ------ FS, chord, 35 % >29 vel PLAX Peak RV-RA 20 mm ------ LVPW, ED 9.94 mm ------ gradient,  S Hg IVS/LVPW 1.26 <1.3 Right ventricle ratio, ED Sa vel, lat 10.2 cm/s ------ Ventricular septum ann, tiss DP IVS, ED 12.5 mm ------ LVOT Diam, S 20 mm ------ Area 3.14 cm^2 ------ Aorta Root diam, 62 mm ------ ED Left atrium AP dim 33 mm ------ AP dim 2.14 cm/m^2 <2.2 index  ------------------------------------------------------------ Prepared and Electronically Authenticated by  Kristeen Miss 2014-03-15T12:15:[id number]  CATH: Procedural Findings:  Hemodynamics  RA 5  RV 25/5  PA 26/14  PCWP 15  LV 130/13  AO 129/65 (89)f  Oxygen saturations:  SVC 55%  PA 63%  AO 96%  Cardiac Output (Fick) 2.87 L/min/m2  Cardiac Index (Fick) 1.81 L/min/m2  Cardiac Output (Thermo) 2.92 L/min  Cardiac Index (Thermo) 1.84 L/min/m2  Coronary angiography:  Coronary dominance: right  Left mainstem: Short and without obstruction  Left anterior descending (LAD): Moderate calcification with about 30% luminal irregularity proximally. No critical obstruction. The vessel terminates as an apical LAD and large distal diagonal.  Left circumflex (LCx): Consists mainly of a large OM branch. There is about 40% segmental plaquing just beyond the takeoff of the small AV circumflex.  Right coronary artery (RCA): Large caliber vessel with 30% proximal narrowing that is eccentric. There is a large caliber PDA and smaller PDA, both free of major disease.  Left ventriculography: There was ectopy with LV angiography, so assessment of LV function is not reliable. EF estimate would be 50%, but unreliable.  Aortography reveals a very large  aortic root down involving the sinus of valsalva. There is mild calcification of the valve. There is moderately severe aortic regurgitation (3 plus) noted.  Final Conclusions:  1. Aortic regurgitation, moderately severe  2. Dilated LV with likely reduction in LV function  3. Recent onset of atrial fibrillation  4. Moderate coronary calcification, without critical obstruction.   Recommendations:  1. Defer surgical decisions to patient with Dr Patty Sermons.  2. Rate control.  3. Given AI and wide pulse pressure, and very small size will defer reanticoagulation to am. Would consider low dose heparin for 1-2 days before consideration a NOAC given AI and risk of bleeding.  Shawnie Pons MD, [facility], FSCAI  02/02/2013, 7:00 PM      Recent Radiology Findings:  Dg Chest 2 View  01/30/2013  *RADIOLOGY REPORT*  Clinical Data: Shortness of breath with deep breath.  History of atrial fibrillation and aortic insufficiency.  CHEST - 2 VIEW  Comparison: 10/29/2012  Findings: Borderline heart size with normal pulmonary vascularity. Emphysematous changes in the lungs.  Slight interstitial fibrosis diffusely with mild focal scarring in the left lung base.  This appears stable since previous study.  No focal consolidation or airspace disease in the lungs.  No blunting of costophrenic angles. No pneumothorax.  Mediastinal contours appear intact. Degenerative changes in the thoracic spine.  No significant change since previous study.  IMPRESSION: Emphysematous changes and scarring in the lungs.  No evidence of active pulmonary disease.   Original Report Authenticated By: Burman Nieves, M.D.     MR THIGH/FEMUR LEFT WWO CONTRAST (TUMOR) (11/18/2012 8:52 AM EST)   Impressions  Likely evolving seroma/hematoma at the surgical site in the proximal medial left thigh which is unchanged in size in comparison to recent study from 10/07/2012 but larger in comparison to prior study from 01/08/2012.  Tumor recurrence cannot be completely excluded.  This could be further evaluated with percutaneous biopsy and/or followup MRI in 3 to 6 months.  Findings discussed with Dr. Jana Half by Dr. Revonda Standard at 10:00 a.m. on 11/18/2012.    Narrative  MR LEFT THIGH/FEMUR WITH AND WITHOUT CONTRAST (TUMOR PROTOCOL), Nov 18, 2012 08:52:00 AM  .  INDICATION: eval for tumor recurrence171.9 Myxoid liposarcoma  COMPARISON:  Multiple prior most recently from 10/07/2012, and 01/08/2012.  Marland Kitchen  TECHNIQUE: Multiplanar, multisequence MR images of the left thigh/femur were obtained before and after intravenous administration of gadolinium-based contrast.  .  FINDINGS:  Postsurgical changes of tumor resection within the medial aspect of the upper thigh with associated architectural distortion of the soft tissues.  There is a 3.4 x 2.9 x 5.8 cm postoperative collection within the resection bed (series 10 image 33 series 7 image 13 ).  It measured 2.4 x 2.1 x 6.5 cm on prior examination from 12/29/2011 but is unchanged from 10/07/2012.  There is a dark peripheral rim (low on T1 and T2) suggesting hemosiderin.  There is an internal high T1 signal component which may related to blood products/hemorrhage (series 16, image 33).  There is no surrounding abnormal increased T2 signal. No visualized lymphadenopathy. No nodular or masslike enhancement. No new lesion.  Similar feathery increased T2 signal surrounding the femoral vessels and within the remaining adductors likely representing posttreatment changes.  Patchy marrow signal within the femoral diaphysis like related to post radiation changes.  Superior and anterosuperior chondrolabral separation and degenerative changes of the labrum.  Mild degenerative changes of the pubic symphysis and left sacroiliac joint.  Colonic diverticulosis without MR evidence of diverticulitis.  Please note the study is not tailored to the intrinsic structures of the knee or hip.    CT of Chest abdomen: Result Narrative  CT INFUSED CHEST, ABDOMEN AND PELVIS, Oct 07, 2012 11:38:00 AM . INDICATION:  eval for chest mets171.9 Myxoid liposarcoma  COMPARISON: Most recent CT CAP 01/08/2012. Marland Kitchen TECHNIQUE:  After administration of oral contrast and water, 125 cc of IV contrast material were infused, and dynamic thin-section images were obtained of the chest, abdomen, and pelvis in the portal venous phase at  approximately 60 sec.  . ADDITIONAL TECHNIQUE:  None . CHEST: .  Chest wall/thoracic inlet: Within normal limits. .  Thyroid: Within normal limits. .  Mediastinum/hila: No mediastinal or hilar adenopathy. Patulous esophagus. Marland Kitchen  Heart/vessels:  Mild cardiomegaly. Trace pericardial effusion . Aneurysmal dilation of the aortic root and ascending aorta measuring 4.7 cm maximally, similar to recent imaging from February 2013. Atherosclerotic calcifications of the aorta and coronary arteries. .  Lungs: Biapical pleural parenchymal scarring as well as scarring within the lung bases and lingula. No suspicious appearing pulmonary nodules. Stable appearance of small pulmonary nodules dating back over 2 years, likely reflecting benign disease. No consolidative changes. .  Pleura: Within normal limits. . ABDOMEN: .  Liver: Similar appearance of the liver with scattered peripherally enhancing lesions, most consistent with hemangiomas. There are additional too small to characterize low attenuating lesions again noted. .  Gallbladder/Biliary: Within normal limits. .  Spleen: Within normal limits. .  Pancreas: Within normal limits. .  Adrenals: Within normal limits. .  Kidneys: Scattered too small to characterize low attenuating lesions the kidneys. .  Peritoneum/Mesenteries: Within normal limits. .  Extraperitoneum: Within normal limits. .  Gastrointestinal tract: Colonic diverticulosis. No bowel obstruction. Normal appendix. .  Vascular: Aortobiiliac atherosclerotic calcifications without aneurysm. Marland Kitchen PELVIS: .  Peritoneum: Within normal limits. .  Extraperitoneum: Within normal limits. .  Ureters: Within normal limits. .  Bladder: Within normal limits. .  Reproductive System: Hysterectomy. .  Vascular: Prominent venous collaterals in the pelvis, as can be seen with pelvic congestion syndrome. Ectasia of the left common iliac artery just proximal to its bifurcation measuring 18 mm, similar to prior  (image 177). . MSK: No acute osseous abnormality. Degenerative changes of the spine. Osteopenia. . ADDITIONAL COMMENTS: None. .   Recent Lab Findings: Lab Results  Component Value Date   WBC 6.4 02/03/2013   HGB 13.0 02/03/2013   HCT 37.9 02/03/2013   PLT 144* 02/03/2013   GLUCOSE 101* 02/02/2013   ALT 60* 01/30/2013   AST 35 01/30/2013   NA 141 02/02/2013   K 3.7 02/02/2013   CL 107 02/02/2013   CREATININE 0.61 02/02/2013   BUN 16 02/02/2013   CO2 25 02/02/2013   TSH 2.187 01/30/2013   INR 1.00 01/30/2013      Assessment / Plan:   Dilated aortic root 6.2 cm  With mod/severe  AI and   Short left main without coronary occlusive disease New onset Afib History of Liposarcoma low grade respected and treated with preop radiation Stable pulmonary nodules by CT scan done at [facility]   I have discussed the risks and options of aortic root descending aorta and aortic valve replacement with the patient and her husband. She has a followup appointment April 1 for chest imaging and MRI of the leg with Dr. Robynn Pane at [facility]. The patient will keep her followup appointment there and then return to see me in early April and consider aortic  root and ascending aorta  placement if there is no evidence of progression or metastasis of sarcoma.   Delight Ovens MD  Beeper  [phone] Office  [phone] 02/03/2013 12:06 PM

## 2013-02-03 NOTE — Progress Notes (Signed)
Subjective:  Doing well after cath yesterday. Still in atrial fib with controlled VR. Groin okay. Delaying full anticoagulation until tomorrow per Dr. Riley Kill recommendation.  Objective:  Vital Signs in the last 24 hours: Temp:  [97.3 F (36.3 C)-98.5 F (36.9 C)] 97.3 F (36.3 C) (03/18 0800) Pulse Rate:  [67-95] 90 (03/18 0800) Resp:  [16-20] 18 (03/18 0800) BP: (111-154)/(52-75) 121/75 mmHg (03/18 0800) SpO2:  [93 %-98 %] 97 % (03/18 0800) Weight:  [114 lb (51.71 kg)] 114 lb (51.71 kg) (03/18 0500)  Intake/Output from previous day:   Intake/Output from this shift:    . aspirin EC  81 mg Oral Daily  . diltiazem  30 mg Oral Q6H  . furosemide  20 mg Oral Daily  . levothyroxine  75 mcg Oral QAC breakfast  . lisinopril  2.5 mg Oral Daily  . metoprolol tartrate  12.5 mg Oral BID      Physical Exam: The patient appears to be in no distress.  the general appearance reveals an elderly woman in no acute distress. Head and neck exam reveal that the pupils are equal round reactive to light and accommodation there is no scleral icterus. Mouth and pharynx are normal. No lymphadenopathy. She does have elevated jugular venous pressure. Carotids reveal no bruits and the carotid upstroke is bounding consistent with her severe aortic insufficiency  The chest is clear to percussion and auscultation. The heart reveals a hyperdynamic precordium with an irregular rapid heart action and grade 2-3 diastolic aortic murmur of aortic insufficiency. No S3 gallop  The abdomen is soft and nontender. The liver is palpable in the right upper quadrant and slightly pulsatile. Groin reveals no hematoma or bruit. The extremities reveal a large area of irregular fullness in the left inner thigh and groin area in the region of the previous excision of her sarcoma. The pedal pulses are normal and there is no significant peripheral edema.  Strength is normal and symmetrical in all extremities. There is no  lateralizing weakness. There are no sensory deficits.The skin is warm and dry. There is no rash.  Lab Results:  Recent Labs  02/02/13 0500 02/03/13 0455  WBC 4.9 6.4  HGB 13.2 13.0  PLT 152 144*    Recent Labs  02/01/13 0515 02/02/13 0500  NA 141 141  K 3.6 3.7  CL 103 107  CO2 26 25  GLUCOSE 95 101*  BUN 16 16  CREATININE 0.71 0.61   No results found for this basename: TROPONINI, CK, MB,  in the last 72 hours Hepatic Function Panel No results found for this basename: PROT, ALBUMIN, AST, ALT, ALKPHOS, BILITOT, BILIDIR, IBILI,  in the last 72 hours No results found for this basename: CHOL,  in the last 72 hours No results found for this basename: PROTIME,  in the last 72 hours  Imaging: Imaging results have been reviewed  Cardiac Studies: Telemetry atrial fib Assessment/Plan:  1. Recent onset of atrial fibrillation with rapid ventricular response, now rate controlled  2. severe aortic insufficiency  3. dilatation of the ascending aortic root  4. 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 past history of polymyalgia rheumatica, inactive and she is not presently on prednisone  6. hypothyroidism on Synthroid  Plan: Dr Tyrone Sage to see today re possible eventual surgery.  Final decision on surgery will depend on getting clearance from her sarcoma doctors in [place]. She has appt on April 1. Possibly home tomorrow on xarelto.  Low  dose heparin today.  LOS: 4 days    Cassell Clement 02/03/2013, 10:26 AM

## 2013-02-04 ENCOUNTER — Encounter (HOSPITAL_COMMUNITY): Payer: Self-pay | Admitting: Physician Assistant

## 2013-02-04 LAB — CBC
Platelets: 132 10*3/uL — ABNORMAL LOW (ref 150–400)
RBC: 3.83 MIL/uL — ABNORMAL LOW (ref 3.87–5.11)
WBC: 4.6 10*3/uL (ref 4.0–10.5)

## 2013-02-04 MED ORDER — LISINOPRIL 2.5 MG PO TABS: 2.5000 mg | ORAL_TABLET | Freq: Every day | ORAL | Status: DC

## 2013-02-04 MED ORDER — DILTIAZEM HCL ER COATED BEADS 120 MG PO CP24
120.0000 mg | ORAL_CAPSULE | Freq: Every day | ORAL | Status: DC
  Administered 2013-02-04: 120 mg via ORAL
  Filled 2013-02-04: qty 1

## 2013-02-04 MED ORDER — RIVAROXABAN 15 MG PO TABS
15.0000 mg | ORAL_TABLET | Freq: Every day | ORAL | Status: DC
  Filled 2013-02-04: qty 1

## 2013-02-04 MED ORDER — METOPROLOL TARTRATE 25 MG PO TABS: 12.5000 mg | ORAL_TABLET | Freq: Two times a day (BID) | ORAL | Status: DC

## 2013-02-04 MED ORDER — RIVAROXABAN 20 MG PO TABS
20.0000 mg | ORAL_TABLET | Freq: Every day | ORAL | Status: DC
  Filled 2013-02-04: qty 1

## 2013-02-04 MED ORDER — RIVAROXABAN 15 MG PO TABS: 15.0000 mg | ORAL_TABLET | Freq: Every day | ORAL | Status: DC

## 2013-02-04 MED ORDER — DILTIAZEM HCL ER COATED BEADS 120 MG PO CP24: 120.0000 mg | ORAL_CAPSULE | Freq: Every day | ORAL | Status: DC

## 2013-02-04 NOTE — Progress Notes (Signed)
Subjective:  Doing well.  Appreciate Dr. Dennie Maizes consult yesterday. Still in atrial fib with controlled VR. Groin okay. Start xarelto today.  Objective:  Vital Signs in the last 24 hours: Temp:  [97.7 F (36.5 C)-98.2 F (36.8 C)] 97.7 F (36.5 C) (03/19 0500) Pulse Rate:  [62-82] 62 (03/19 0500) Resp:  [16-18] 16 (03/19 0500) BP: (96-115)/(56-70) 115/70 mmHg (03/19 0500) SpO2:  [96 %-97 %] 96 % (03/19 0500) Weight:  [117 lb 4.8 oz (53.207 kg)] 117 lb 4.8 oz (53.207 kg) (03/19 0500)  Intake/Output from previous day: 03/18 0701 - 03/19 0700 In: 1315 [P.O.:1315] Out: -  Intake/Output from this shift:    . aspirin EC  81 mg Oral Daily  . diltiazem  120 mg Oral Daily  . furosemide  20 mg Oral Daily  . levothyroxine  75 mcg Oral QAC breakfast  . lisinopril  2.5 mg Oral Daily  . metoprolol tartrate  12.5 mg Oral BID  . rivaroxaban  20 mg Oral Q supper      Physical Exam: The patient appears to be in no distress.  the general appearance reveals an elderly woman in no acute distress. Head and neck exam reveal that the pupils are equal round reactive to light and accommodation there is no scleral icterus. Mouth and pharynx are normal. No lymphadenopathy. She does have elevated jugular venous pressure. Carotids reveal no bruits and the carotid upstroke is bounding consistent with her severe aortic insufficiency  The chest is clear to percussion and auscultation. The heart reveals a hyperdynamic precordium with an irregular rapid heart action and grade 2-3 diastolic aortic murmur of aortic insufficiency. No S3 gallop  The abdomen is soft and nontender. The liver is palpable in the right upper quadrant and slightly pulsatile. Groin reveals no hematoma or bruit. Slight ecchymosis The extremities reveal a large area of irregular fullness in the left inner thigh and groin area in the region of the previous excision of her sarcoma. The pedal pulses are normal and there is no  significant peripheral edema.  Strength is normal and symmetrical in all extremities. There is no lateralizing weakness. There are no sensory deficits.The skin is warm and dry. There is no rash.  Lab Results:  Recent Labs  02/03/13 0455 02/04/13 0432  WBC 6.4 4.6  HGB 13.0 12.2  PLT 144* 132*    Recent Labs  02/02/13 0500  NA 141  K 3.7  CL 107  CO2 25  GLUCOSE 101*  BUN 16  CREATININE 0.61   No results found for this basename: TROPONINI, CK, MB,  in the last 72 hours Hepatic Function Panel No results found for this basename: PROT, ALBUMIN, AST, ALT, ALKPHOS, BILITOT, BILIDIR, IBILI,  in the last 72 hours No results found for this basename: CHOL,  in the last 72 hours No results found for this basename: PROTIME,  in the last 72 hours  Imaging: Imaging results have been reviewed  Cardiac Studies: Telemetry atrial fib Assessment/Plan:  1. Recent onset of atrial fibrillation with rapid ventricular response, now rate controlled  2. severe aortic insufficiency  3. dilatation of the ascending aortic root  4. 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 past history of polymyalgia rheumatica, inactive and she is not presently on prednisone  6. hypothyroidism on Synthroid  Plan:Home today. Rate control and xarelto. Return to see me or Lawson Fiscal in 1 week.  She will see Dr. Tyrone Sage after she has seen her orthopedic  oncologist at [facility] on April 1.   LOS: 5 days    Cassell Clement 02/04/2013, 8:47 AM

## 2013-02-04 NOTE — Discharge Summary (Signed)
Discharge Summary   Patient ID: Kayla Chambers MRN: [id number], DOB/AGE: Nov 14, 1927 79 y.o. Admit date: 01/30/2013 D/C date:     02/04/2013  Primary Cardiologist: [place]  Primary Discharge Diagnoses:  1. Newly recognized atrial fibrillation with RVR 2. Severe aortic insufficiency with dilatation of the ascending aortic root - awaiting eval for #4 before going forward with surgery 3. Acute on chronic diastolic CHF 4. 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 Moderate coronary calcification, without critical obstruction by cath 02/02/13  Secondary Discharge Diagnoses:  1. Prior polymyalgia rheumatica, inactive and she is not presently on prednisone  2. Hypothyroidism 3. Peripheral neuropathy 4. Elevated blood pressure 5. Rheumatic fever age 40 6. Vertigo 7. Osteoporosis 8. Hx migraines 9. Fibromyalgia   Hospital Course: Kayla Chambers is an 79 y/o F with history of dilated aortic root witih severe AI, inactive polymyalgia rheumatica, hypothyroidism who was seen in the office 01/30/13 as a work-in due to heart palpitations, increased SOB, orthopnea. She was foundt o be in atrial fib with RVR. Complicating her history is the fact that she has a past history of sarcoma of the left leg with recent MRI in December at [facility] showing possible local recurrence. She is due to follow up with them in several weeks. With her rapid afib, she was admitted to the hospital, started on IV heparin and diltiazem. 2D Echo showed EF 50-55%, moderate AI, severe dilation of the sinus(es) of Valsalva. She was also placed on Lasix for acute on chronic diastolic CHF with improvement in symptpoms. She underwent cath 02/02/13 showing Final Conclusions:  1. Aortic regurgitation, moderately severe  2. Dilated LV with likely reduction in LV function  3. Recent onset of atrial fibrillation  4. Moderate coronary calcification, without  critical obstruction. TCTS was consulted for possible surgery. Dr. Tyrone Sage discussed risks and options of aortic root descending aorta and aortic valve replacement with the patient and her husband. Per his note, the patient will keep her followup appointment at [facility] to further evaluate her sarcoma and then return to see him in early April to consider aortic root and ascending aorta placement if there is no evidence of progression or metastasis of sarcoma. She was started on Xarelto today (this was delayed due to AI, wide pulse pressure, and very small size for risk of bleeding). Dr. Patty Sermons has seen and examined her today and feels she is stable for discharge. She will f/u with Dr. Tyrone Sage 4/3. We will see her in 1 week for [facility] CHF appointment. Per discussion with Dr. Patty Sermons, we will not prescribe Lasix at discharge but instead follow clinically (BP borderline low, started on ACEI and diltiazem this admission, metoprolol changed to lopressor). We will also plug her into the [facility] for an education visit on the same day for periodic monitoring while on Xarelto.   Discharge Vitals: Blood pressure 115/70, pulse 62, temperature 97.7 F (36.5 C), temperature source Oral, resp. rate 16, height 5\' 5"  (1.651 m), weight 117 lb 4.8 oz (53.207 kg), SpO2 96.00%.  Labs: Lab Results  Component Value Date   WBC 4.6 02/04/2013   HGB 12.2 02/04/2013   HCT 36.1 02/04/2013   MCV 94.3 02/04/2013   PLT 132* 02/04/2013    Recent Labs Lab 01/30/13 1930  02/02/13 0500  NA 137  < > 141  K 4.0  < > 3.7  CL 101  < > 107  CO2  25  < > 25  BUN 16  < > 16  CREATININE 0.65  < > 0.61  CALCIUM 9.7  < > 8.9  PROT 6.3  --   --   BILITOT 0.3  --   --   ALKPHOS 74  --   --   ALT 60*  --   --   AST 35  --   --   GLUCOSE 111*  < > 101*  < > = values in this interval not displayed.   Diagnostic Studies/Procedures   1. Cardiac catheterization this admission, please see full report and above for summary. 2.  2D Echo 01/31/13 Study Conclusions - Left ventricle: The cavity size was moderately dilated. Wall thickness was normal. Systolic function was normal. The estimated ejection fraction was in the range of 50% to 55%. Wall motion was normal; there were no regional wall motion abnormalities. - Aortic valve: Moderate regurgitation.  3. Dg Chest 2 View 01/30/2013  *RADIOLOGY REPORT*  Clinical Data: Shortness of breath with deep breath.  History of atrial fibrillation and aortic insufficiency.  CHEST - 2 VIEW  Comparison: 10/29/2012  Findings: Borderline heart size with normal pulmonary vascularity. Emphysematous changes in the lungs.  Slight interstitial fibrosis diffusely with mild focal scarring in the left lung base.  This appears stable since previous study.  No focal consolidation or airspace disease in the lungs.  No blunting of costophrenic angles. No pneumothorax.  Mediastinal contours appear intact. Degenerative changes in the thoracic spine.  No significant change since previous study.  IMPRESSION: Emphysematous changes and scarring in the lungs.  No evidence of active pulmonary disease.   Original Report Authenticated By: Burman Nieves, M.D.     Discharge Medications     Medication List    STOP taking these medications       aspirin EC 81 MG tablet     atenolol 25 MG tablet  Commonly known as:  TENORMIN      TAKE these medications       ALPRAZolam 0.5 MG tablet  Commonly known as:  XANAX  Take 0.5 tablets (0.25 mg total) by mouth at bedtime as needed. sleep     b complex vitamins tablet  Take 1 tablet by mouth daily.     CALCIUM + D PO  Take 1 tablet by mouth 3 (three) times daily.     CRANBERRY PO  Take 1 tablet by mouth 3 (three) times daily.     diltiazem 120 MG 24 hr capsule  Commonly known as:  CARDIZEM CD  Take 1 capsule (120 mg total) by mouth daily.     fish oil-omega-3 fatty acids 1000 MG capsule  Take 1 g by mouth 2 (two) times daily.     levothyroxine 75  MCG tablet  Commonly known as:  SYNTHROID, LEVOTHROID  Take 75 mcg by mouth daily.     lisinopril 2.5 MG tablet  Commonly known as:  PRINIVIL,ZESTRIL  Take 1 tablet (2.5 mg total) by mouth daily.     metoprolol tartrate 25 MG tablet  Commonly known as:  LOPRESSOR  Take 0.5 tablets (12.5 mg total) by mouth 2 (two) times daily.     multivitamin per tablet  Take 1 tablet by mouth daily.     PROBIOTIC DAILY PO  Take 1 capsule by mouth daily.     Rivaroxaban 15 MG Tabs tablet  Commonly known as:  XARELTO  Take 1 tablet (15 mg total) by mouth daily with supper.  Disposition   The patient will be discharged in stable condition to home.     Discharge Orders   Future Appointments Provider Department Dept Phone   02/11/2013 2:30 PM [facility] [phone]   02/11/2013 3:00 PM Rosalio Macadamia, NP [employer] Main Office [place]) [phone]   02/19/2013 9:00 AM Delight Ovens, MD [facility] [phone]   Future Orders Complete By Expires     Diet - low sodium heart healthy  As directed     Discharge instructions  As directed     Comments:      For patients with congestive heart failure/fluid overload, we give them these special instructions:  1. Follow a low-salt diet and watch your fluid intake. In general, you should not be taking in more than 2 liters of fluid per day. Some patients are restricted to less than 1.5 liters. This includes sources of water in foods like soup. 2. Weigh yourself on the same scale at same time of day and keep a log. 3. Call your doctor: (Anytime you feel any of the following symptoms)  - 3-4 pound weight gain in 1-2 days or 2 pounds overnight  - Shortness of breath, with or without a dry hacking cough  - Swelling in the hands, feet or stomach  - If you have to sleep on extra pillows at night in order to breathe    Increase activity slowly  As  directed     Comments:      No driving until cleared by your cardiologist. No lifting over 5 lbs for 1 week. No sexual activity for 1 week. Keep procedure site clean & dry. If you notice increased pain, swelling, bleeding or pus, call/return!  You may shower, but no soaking baths/hot tubs/pools for 1 week.      Follow-up Information   Follow up with GERHARDT,EDWARD B, MD. (02/19/13 at 9am)    Contact information:   [address] [employer] [address] [place] [address] [phone]       Follow up with [facility]. (Follow up at [facility] for evaluate of your sarcoma as planned.)       Follow up with Norma Fredrickson, NP. (02/11/13 -- You will have an education visit in our [facility] at 2:30pm, and appointment with Norma Fredrickson at 3pm.)    Contact information:   [address] [place] [address] [phone]         Duration of Discharge Encounter: Greater than 30 minutes including physician and PA time.  Signed, May Manrique PA-C 02/04/2013, 1:01 PM

## 2013-02-05 ENCOUNTER — Telehealth: Payer: Self-pay | Admitting: Cardiology

## 2013-02-05 NOTE — Telephone Encounter (Signed)
New problem    Addendum: appt was made on yesterday  3/19 forgot to send message.     TOC 7 days appt with Lawson Fiscal on  3/256 @ pm .

## 2013-02-05 NOTE — Telephone Encounter (Signed)
Spoke with patient and patient's husband. Patient states she feels "okay", is very tired and just needs to catch up on her sleep due to be woken up every 2 hours at the hospital.  Husband states patient has 2 appointments here on 3/26, one with Norma Fredrickson, NP and one with coumadin clinic. Patient states she feels like she may have cystitis from "all the fluids they were pumping out of me."  Patient c/o mild SOB, tired and needs to rest; denies chest discomfort. Patient and husband verified understanding for patient to monitor her weight and to call us back or to seek medical attention if weight increases, or if SOB continues or gets worse.

## 2013-02-10 ENCOUNTER — Encounter: Payer: Self-pay | Admitting: Cardiology

## 2013-02-10 ENCOUNTER — Ambulatory Visit (INDEPENDENT_AMBULATORY_CARE_PROVIDER_SITE_OTHER): Payer: Medicare Other | Admitting: *Deleted

## 2013-02-10 ENCOUNTER — Ambulatory Visit (INDEPENDENT_AMBULATORY_CARE_PROVIDER_SITE_OTHER): Payer: Medicare Other | Admitting: Cardiology

## 2013-02-10 VITALS — BP 122/68 | HR 58 | Ht 65.0 in | Wt 119.0 lb

## 2013-02-10 DIAGNOSIS — I480 Paroxysmal atrial fibrillation: Secondary | ICD-10-CM

## 2013-02-10 DIAGNOSIS — I5033 Acute on chronic diastolic (congestive) heart failure: Secondary | ICD-10-CM

## 2013-02-10 DIAGNOSIS — I4891 Unspecified atrial fibrillation: Secondary | ICD-10-CM

## 2013-02-10 DIAGNOSIS — I351 Nonrheumatic aortic (valve) insufficiency: Secondary | ICD-10-CM

## 2013-02-10 DIAGNOSIS — I359 Nonrheumatic aortic valve disorder, unspecified: Secondary | ICD-10-CM

## 2013-02-10 DIAGNOSIS — I77810 Thoracic aortic ectasia: Secondary | ICD-10-CM

## 2013-02-10 NOTE — Assessment & Plan Note (Signed)
The patient has had a gradual steady progression in the size of her aortic root.  In September 2012 her echocardiogram showed a aortic root dimension of 52-55 mm and in 2013 her sinus of Valsalva measured at 61 mm and presently measures 62 mm.  I have strongly encouraged her to  consider proceeding with aortic valve replacement and replacement of her ascending aortic root.

## 2013-02-10 NOTE — Progress Notes (Signed)
Kayla Chambers Date of Birth:  1927-05-27 [facility] [address] [place], [place]  [address] [phone]         Fax   [phone]  History of Present Illness: This pleasant 79 year old woman is seen for a followup post hospital office visit.  The patient was admitted to the hospital on 01/30/13 and discharged on 02/04/13.  She has a past history of known severe aortic insufficiency with dilatation of the ascending aortic root.  Her most recent echocardiogram showed an aortic root dimension of 61 mm at the sinuses of Valsalva.  She was admitted on 01/30/13 with increasing weakness shortness of breath and new recognized atrial fibrillation with rapid ventricular response.  She has a past history of acute on chronic diastolic congestive heart failure.  She has a past history of previous sarcoma of the left leg and is followed closely at [facility] for this and has an appointment there in one week.  On her recent hospitalization the patient underwent cardiac catheterization which showed moderate coronary calcification without critical obstruction by 02/02/13.  Two-dimensional echocardiogram 01/31/13 showed moderately dilated left ventricular cavity size with normal wall status and normal systolic function with ejection fraction estimated at 50-55% and no regional wall motion abnormalities.  There was moderate aortic valve regurgitation.  In the hospital she was seen in consultation by Dr. Tyrone Sage.  Final decision concerning surgery of her aortic root and aortic valve replacement is pending awaiting results of updated status report on her left leg sarcoma.  She is scheduled for an MRI of her leg at that hospital on April 1.  Because of her atrial fibrillation the patient is on Xarelto 15 mg daily.   Current Outpatient Prescriptions  Medication Sig Dispense Refill  . ALPRAZolam (XANAX) 0.5 MG tablet Take 0.5 tablets (0.25 mg total) by mouth at bedtime as needed. sleep  30  tablet  5  . b complex vitamins tablet Take 1 tablet by mouth daily.      . Calcium Carbonate-Vitamin D (CALCIUM + D PO) Take 1 tablet by mouth 3 (three) times daily.       Marland Kitchen CRANBERRY PO Take 1 tablet by mouth 3 (three) times daily.       Marland Kitchen diltiazem (CARDIZEM CD) 120 MG 24 hr capsule Take 1 capsule (120 mg total) by mouth daily.  30 capsule  6  . fish oil-omega-3 fatty acids 1000 MG capsule Take 1 g by mouth 2 (two) times daily.       Marland Kitchen levothyroxine (SYNTHROID, LEVOTHROID) 75 MCG tablet Take 75 mcg by mouth daily.        Marland Kitchen lisinopril (PRINIVIL,ZESTRIL) 2.5 MG tablet Take 1 tablet (2.5 mg total) by mouth daily.  30 tablet  6  . metoprolol tartrate (LOPRESSOR) 25 MG tablet Take 0.5 tablets (12.5 mg total) by mouth 2 (two) times daily.  60 tablet  6  . multivitamin (THERAGRAN) per tablet Take 1 tablet by mouth daily.        . Probiotic Product (PROBIOTIC DAILY PO) Take 1 capsule by mouth daily.       . Rivaroxaban (XARELTO) 15 MG TABS tablet Take 1 tablet (15 mg total) by mouth daily with supper.  30 tablet  6  . Loperamide HCl (LOPERAMIDE A-D PO) Take 1 tablet by mouth as needed. Take by mouth. as needed for diarrhea       No current facility-administered medications for this visit.    Allergies  Allergen Reactions  .  Alendronate Sodium Nausea And Vomiting  . Lyrica (Pregabalin) Nausea And Vomiting  . Neurontin (Gabapentin) Nausea And Vomiting  . Procaine Hcl   . Toprol Xl (Metoprolol Succinate) Other (See Comments)    Does not remember     Patient Active Problem List  Diagnosis  . Aortic insufficiency  . Dilated aortic root  . Sarcoma  . Hypothyroidism  . Polymyalgia rheumatica  . Peripheral neuropathy  . Dizziness  . Elevated blood pressure  . Atrial fibrillation  . Acute on chronic diastolic heart failure  . Disorder of liver  . Solitary pulmonary nodule  . 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 Never Smoker   Smokeless  tobacco  . Never Used    History  Alcohol Use No    Family History  Problem Relation Age of Onset  . Heart disease Mother   . Stroke Mother   . Arthritis Father   . Cancer Father   . Hypertension Sister   . Anesthesia problems Brother     Review of Systems: Constitutional: no fever chills diaphoresis or fatigue or change in weight.  Head and neck: no hearing loss, no epistaxis, no photophobia or visual disturbance. Respiratory: No cough, shortness of breath or wheezing. Cardiovascular: No chest pain peripheral edema, palpitations. Gastrointestinal: No abdominal distention, no abdominal pain, no change in bowel habits hematochezia or melena. Genitourinary: No dysuria, no frequency, no urgency, no nocturia. Musculoskeletal:No arthralgias, no back pain, no gait disturbance or myalgias. Neurological: No dizziness, no headaches, no numbness, no seizures, no syncope, no weakness, no tremors. Hematologic: No lymphadenopathy, no easy bruising. Psychiatric: No confusion, no hallucinations, no sleep disturbance.    Physical Exam: Filed Vitals:   02/10/13 0927  BP: 122/68  Pulse: 58   the general appearance reveals a well-developed well-nourished woman in no distress.The head and neck exam reveals pupils equal and reactive.  Extraocular movements are full.  There is no scleral icterus.  The mouth and pharynx are normal.  The neck is supple.  The carotids reveal no bruits.  The jugular venous pressure is normal.  The  thyroid is not enlarged.  There is no lymphadenopathy.  The chest is clear to percussion and auscultation.  There are no rales or rhonchi.  Expansion of the chest is symmetrical.  The precordium is quiet.  The first heart sound is normal.  The second heart sound is physiologically split.  There is no  gallop rub or click.  There is a grade 3/6 decrescendo murmur of aortic insufficiency at the left sternal edge. There is no abnormal lift or heave.  The abdomen is soft and  nontender.  The bowel sounds are normal.  The liver and spleen are not enlarged.  There are no abdominal masses.  There are no abdominal bruits.  Right groin reveals a minimally tender resolving hematoma with soft bruit.  There is surrounding ecchymosis.  Extremities reveal good pedal pulses.   There is no phlebitis or edema.  There is no cyanosis or clubbing.  Strength is normal and symmetrical in all extremities.  There is no lateralizing weakness.  There are no sensory deficits.  The skin is warm and dry.  There is no rash.  EKG confirms that she is in normal sinus rhythm with occasional PACs.   Assessment / Plan: Continue same medication.  Recheck in one month for followup office visit and EKG.  Keep appointment with Dr. Tyrone Sage in early April.

## 2013-02-10 NOTE — Patient Instructions (Addendum)
Your physician recommends that you continue on your current medications as directed. Please refer to the Current Medication list given to you today.  Your physician recommends that you schedule a follow-up appointment in: 1 month ov/ekg

## 2013-02-10 NOTE — Assessment & Plan Note (Signed)
The patient is not having any chest pain or shortness of breath. 

## 2013-02-10 NOTE — Assessment & Plan Note (Signed)
The patient was in atrial fibrillation at the time of discharge from the hospital last week.  Over the weekend the patient went back into normal sinus rhythm.  She has felt stronger since going back into normal sinus rhythm.  She has not had any TIA or stroke symptoms.

## 2013-02-10 NOTE — Patient Instructions (Addendum)
Pt will start on Xarelto 20mg s tomorrow, taking 1 tablet daily. Return to clinic for 4 week follow up after medication was started on 03/05/13.              A full discussion of the nature of anticoagulants has been carried out.  A benefit/risk analysis has been presented to the patient, so that they understand the justification for choosing anticoagulation with Xarelto at this time.  The need for compliance is stressed.  Pt is aware to take the medication once daily with the largest meal of the day.  Side effects of potential bleeding are discussed, including unusual colored urine or stools, coughing up blood or coffee ground emesis, nose bleeds or serious fall or head trauma.  Discussed signs and symptoms of stroke. The patient should avoid any OTC items containing aspirin or ibuprofen.  Avoid alcohol consumption.   Call if any signs of abnormal bleeding.  Discussed financial obligations and resolved any difficulty in obtaining medication.  Next lab test test in 6 months.

## 2013-02-10 NOTE — Progress Notes (Signed)
Pt was started on Xarelto for Afib on 02/04/13 in hosiptal.    Reviewed patients medication list.  Pt is not currently on any combined P-gp and strong CYP3A4 inhibitors/inducers (ketoconazole, traconazole, ritonavir, carbamazepine, phenytoin, rifampin, St. John's wort).  Reviewed labs from hospital stay and they are as follow.  SCr 0.61, Weight 54.70 Kg, CrCl- 60.34.  Dose at discharged not appropriate  based on CrCl. Thus, pt given samples of Xarelto 20mg s to start tomorrow with largest meal of the day per Pham D.  Hgb and HCT 12.2/36.1  A full discussion of the nature of anticoagulants has been carried out.  A benefit/risk analysis has been presented to the patient, so that they understand the justification for choosing anticoagulation with Xarelto at this time.  The need for compliance is stressed.  Pt is aware to take the medication once daily with the largest meal of the day.  Side effects of potential bleeding are discussed, including unusual colored urine or stools, coughing up blood or coffee ground emesis, nose bleeds or serious fall or head trauma.  Discussed signs and symptoms of stroke. The patient should avoid any OTC items containing aspirin or ibuprofen.  Avoid alcohol consumption.   Call if any signs of abnormal bleeding.  Discussed financial obligations and resolved any difficulty in obtaining medication.  Next lab test test in 1 month. One month follow up from when med was started in hospital will be 03/10/13.

## 2013-02-11 ENCOUNTER — Encounter: Payer: Medicare Other | Admitting: Nurse Practitioner

## 2013-02-19 ENCOUNTER — Ambulatory Visit (INDEPENDENT_AMBULATORY_CARE_PROVIDER_SITE_OTHER): Payer: Medicare Other | Admitting: Cardiothoracic Surgery

## 2013-02-19 ENCOUNTER — Ambulatory Visit: Payer: Medicare Other | Admitting: Cardiothoracic Surgery

## 2013-02-19 ENCOUNTER — Encounter: Payer: Self-pay | Admitting: Cardiothoracic Surgery

## 2013-02-19 VITALS — BP 121/65 | HR 53 | Resp 16 | Ht 65.0 in | Wt 118.0 lb

## 2013-02-19 DIAGNOSIS — I77810 Thoracic aortic ectasia: Secondary | ICD-10-CM

## 2013-02-19 DIAGNOSIS — I712 Thoracic aortic aneurysm, without rupture, unspecified: Secondary | ICD-10-CM

## 2013-02-19 DIAGNOSIS — I4891 Unspecified atrial fibrillation: Secondary | ICD-10-CM

## 2013-02-19 DIAGNOSIS — I359 Nonrheumatic aortic valve disorder, unspecified: Secondary | ICD-10-CM

## 2013-02-19 DIAGNOSIS — R918 Other nonspecific abnormal finding of lung field: Secondary | ICD-10-CM

## 2013-02-19 DIAGNOSIS — Z85831 Personal history of malignant neoplasm of soft tissue: Secondary | ICD-10-CM

## 2013-02-19 DIAGNOSIS — Z8589 Personal history of malignant neoplasm of other organs and systems: Secondary | ICD-10-CM

## 2013-02-19 DIAGNOSIS — I351 Nonrheumatic aortic (valve) insufficiency: Secondary | ICD-10-CM

## 2013-02-19 NOTE — Patient Instructions (Signed)
Thoracic Aortic Aneurysm An aneurysm is the enlargement (dilatation), bulging or ballooning out of part of the wall of a vein or artery. An Aortic Aneurysm is a bulging in the largest artery of the body. This artery supplies blood from the heart to the rest of the body. The first part of the aorta is called the thoracic aorta. It leaves the heart, ascends (rises), arches, and descends (goes down) through the chest until it reaches the diaphragm (the muscular partition between the chest and abdomen (belly). The second part of the aorta is then called the abdominal aorta after it has passed the diaphragm and continues down through the abdomen. The abdominal aorta ends where it splits to form the two iliac arteries that go to the legs. Aortic aneurysms can develop anywhere along the length of the aorta. A thoracic aortic aneurysm (TAA) occurs in the first part of the aorta, between the heart and the diaphragm. The major importance of an aneurysm is that it can rupture or tear (dissect), causing death unless diagnosed and treated promptly. CAUSES  Most thoracic aortic aneurysms are related to arteriosclerosis. The arteriosclerosis can weaken the aortic wall. The pressure of the blood being pumped through the aorta causes it to balloon out at the site of weakness. Therefore, elevated blood pressure (hypertension) is associated with aneurysm. Other risk factors include:  Age over 60.  Tobacco use.  Female sex.  Family history of aneurysm. Additional causes of thoracic aortic aneurysms include:  Genetics (passed by birth).  Injury: After physical trauma to the aorta.  Inflammation of blood vessels.  Hardening of the arteries.  Infection. SYMPTOMS  Many aneurysms do not cause problems. A small, unchanging or slowly changing aneurysm may produce no symptoms until it suddenly ruptures or dissects (separation of the layers of the aortic wall) without warning. It may then cause death. The symptoms  (problems) of a developing aneurysm will partly depend on its size and rate of growth. Thoracic aortic aneurysms may cause pain in the:  Chest.  Back.  Sides.  Abdomen. The pain most often has a deep quality as if it is boring into the person. It may cause:  Heart failure.  Heart attack.  Hoarseness.  Cough.  Shortness of breath.  Swallowing problems. DIAGNOSIS  A thoracic aortic aneurysm may be suspected based on your symptoms. It may also be detected by x-ray or CT studies done for unrelated reasons.  Several different imaging studies can be used to confirm a TAA:  An echocardiogram is an ultrasound test to examine the heart. It can also examine the first parts of the aorta. Sometimes, this test is done by putting you to sleep and inserting a flexible telescope through your mouth into your esophagus, which is next to the aorta; excellent pictures of the aorta can be obtained. This is called a transesophageal echocardiogram (TEE).  CT scanning of the chest is accurate at showing the exact size and shape of the aneurysm.  MRI scanning is accurate, and is used for certain types of TAA.  An aortic angiogram shows the source of the major blood vessels arising from the aorta. It reveals the size and extent of any aneurysm. It can also show a clot clinging to the wall of the aneurysm (mural thrombus). The angiogram may give information about a tear of the aorta. TREATMENT  Treatment for a thoracic aortic aneurysm depends on:  Location.  Size.  Other factors.  Rate of growth.  Underlying cause. Medical treatment is used   for smaller or complicated aneurysms, or those that do not cause symptoms. These include:  Stopping smoking.  Blood pressure control.  Control of cholesterol. Surgical treatment is used for aneurysms that cause symptoms, or for those that are large or growing in size. The surgical technique depends on the location of the aneurysm. HOME CARE INSTRUCTIONS     If you smoke, stop. If you do not, do not start.  Take all medications as prescribed.  Your caregiver will tell you when to have your aneurysm rechecked, either by echocardiogram or CT scan. Be sure to keep this and all follow-up appointments. SEEK MEDICAL CARE IF:   You develop mild pain in your chest, upper back, sides, or abdomen.  You develop cough, hoarseness or trouble swallowing. SEEK IMMEDIATE MEDICAL CARE IF:   You develop severe chest or abdominal pain, or severe pain moving (radiating) to your back.  You suddenly develop cold or blue toes or feet.  You suddenly develop lightheadedness or fainting spells.  You develop trouble breathing. Document Released: 11/05/2005 Document Revised: 01/28/2012 Document Reviewed: 09/24/2007 [employer] Patient Information 2013 [employer]  

## 2013-02-19 NOTE — Progress Notes (Signed)
[address]  [place] [address]  [phone]  Kayla Chambers  [facility] Medical Record #[id number]  Date of Birth: 07/19/1927  Referring: Dr Ronny Flurry  Primary Care: [name], MD  Ortho: Dr. Tamsen Meek  Chief Complaint: SOB and new Afib  History of Present Illness:   Patient is an 79 year old female followed by Dr. Ronny Flurry. 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 She presented to the office with increasing nocturnal dyspnea orthopnea and rapid heart rate. She was found to be in new onset of atrial fibrillation and was hospitalized. Followup including echocardiogram and cardiac catheterization confirms significant dilatation of the aortic root( 6.2 cm by echo) and moderate to moderately severe aortic insufficiency. She has a history of rheumatic fever at age 30. She's had no previous history of myocardial infarction. 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  She had a followup scan of the lower extremity at. [facility] and was seen by orthopedic surgery , the note note indicated that there was some slight increase in size of the mass at the old excision site but was too small to biopsy at this time .  With the degree of aortic insufficiency and the size of her aortic root cardiac surgery has been recommended to her . In the past she had told Dr. Patty Sermons she was not interested in any surgical intervention but now is willing to discuss the treatment options.    Current Activity/ Functional Status:  Patient is independent with mobility/ambulation, transfers, ADL's, IADL's.  Zubrod Score:  At the time of surgery this patient's most appropriate activity status/level should be described as:  [ ]  Normal activity, no symptoms  [X]  Symptoms, fully ambulatory  [ ]  Symptoms, in bed less than or equal to 50% of the time  [ ]  Symptoms, in bed greater than 50% of the time but less than 100%  [ ]   Bedridden  [ ]  Moribund  Past Medical History   Diagnosis  Date   .  Hypothyroidism    .  Heart palpitations    .  Dilated aortic root      Marked dilatation of aortic root per echo in 2011 with severe AI, mild to moderate MR, moderate TR   .  Aortic insufficiency      severe per echo in 2012   .  Polymyalgia rheumatica    .  Vertigo    .  Peripheral neuropathy    .  Intracranial aneurysm      in late 1970's   .  Osteoporosis    .  Rheumatic heart disease    .  Heart murmur    .  Hyperthyroidism    .  Headache      hx migraines   .  Anxiety    .  Dysrhythmia    .  Fibromyalgia    .  Dilated aortic root 6.2 cm with moderate AI  03/06/2011   .  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 Neoadjuvant RT by Dr. Abelardo Diesel. Surveillance plan: MRI 12/08/11 shows changes in presumed post-op seroma/hematoma with slight increase in size - follow up MRI 11/18/12 showed same size. Will continue with q3 month MRI. CT C/A/P annually alternating with CXR at 6 months.    Past Surgical History  Procedure  Laterality  Date   .  Sarcoma resection   2011   .  Abdominal hysterectomy     .  Bladder suspension     .  Eye surgery     .  Colonoscopy   10/25/2011     Procedure: COLONOSCOPY; Surgeon: Charolett Bumpers, MD; Location: [facility]; Service: Endoscopy; Laterality: N/A;    History   Smoking status   .  Never Smoker   Smokeless tobacco   .  Never Used    History   Alcohol Use  No    Allergies   Allergen  Reactions   .  Alendronate Sodium  Nausea And Vomiting   .  Lyrica (Pregabalin)  Nausea And Vomiting   .  Neurontin (Gabapentin)  Nausea And Vomiting   .  Procaine Hcl    .  Toprol Xl (Metoprolol Succinate)  Other (See Comments)     Does not remember    Current Facility-Administered Medications   Medication  Dose  Route  Frequency  Provider  Last Rate  Last  Dose   .  0.9 % sodium chloride infusion  250 mL  Intravenous  PRN  Cassell Clement, MD     .  acetaminophen (TYLENOL) tablet 650 mg  650 mg  Oral  Q4H PRN  Cassell Clement, MD     .  ALPRAZolam Prudy Feeler) tablet 0.25 mg  0.25 mg  Oral  BID PRN  Cassell Clement, MD     .  aspirin EC tablet 81 mg  81 mg  Oral  Daily  Cassell Clement, MD   81 mg at 02/03/13 0954   .  diltiazem (CARDIZEM) tablet 30 mg  30 mg  Oral  Q6H  Ok Anis, NP   30 mg at 02/03/13 1610   .  furosemide (LASIX) tablet 20 mg  20 mg  Oral  Daily  Cassell Clement, MD   20 mg at 02/03/13 0954   .  heparin injection 5,000 Units  5,000 Units  Subcutaneous  Q8H  Cassell Clement, MD     .  levothyroxine (SYNTHROID, LEVOTHROID) tablet 75 mcg  75 mcg  Oral  QAC breakfast  Cassell Clement, MD   75 mcg at 02/03/13 0954   .  lisinopril (PRINIVIL,ZESTRIL) tablet 2.5 mg  2.5 mg  Oral  Daily  Cassell Clement, MD   2.5 mg at 02/03/13 0954   .  metoprolol tartrate (LOPRESSOR) tablet 12.5 mg  12.5 mg  Oral  BID  Cassell Clement, MD   12.5 mg at 02/03/13 0954   .  ondansetron (ZOFRAN) injection 4 mg  4 mg  Intravenous  Q6H PRN  Cassell Clement, MD     .  sodium chloride 0.9 % injection 3 mL  3 mL  Intravenous  PRN  Cassell Clement, MD     .  sodium chloride 0.9 % injection 3 mL  3 mL  Intravenous  PRN  Cassell Clement, MD     .  zolpidem (AMBIEN) tablet 5 mg  5 mg  Oral  QHS PRN  Dayna N Dunn, PA-C   5 mg at 02/02/13 2146    Prescriptions prior to admission   Medication  Sig  Dispense  Refill   .  ALPRAZolam (XANAX) 0.5 MG tablet  Take 0.5 tablets (0.25 mg total) by mouth at bedtime as needed. sleep  30 tablet  5   .  aspirin EC 81 MG tablet  Take 81 mg by  mouth daily.     Marland Kitchen  atenolol (TENORMIN) 25 MG tablet  Take 25 mg by mouth at bedtime.     Marland Kitchen  b complex vitamins tablet  Take 1 tablet by mouth daily.     .  Calcium Carbonate-Vitamin D (CALCIUM + D PO)  Take 1 tablet by mouth 3 (three) times daily.     Marland Kitchen  CRANBERRY PO  Take  1 tablet by mouth 3 (three) times daily.     .  fish oil-omega-3 fatty acids 1000 MG capsule  Take 1 g by mouth 2 (two) times daily.     Marland Kitchen  levothyroxine (SYNTHROID, LEVOTHROID) 75 MCG tablet  Take 75 mcg by mouth daily.     .  multivitamin (THERAGRAN) per tablet  Take 1 tablet by mouth daily.     .  Probiotic Product (PROBIOTIC DAILY PO)  Take 1 capsule by mouth daily.      Family History   Problem  Relation  Age of Onset   .  Heart disease  Mother    .  Stroke  Mother    .  Arthritis  Father    .  Cancer  Father    .  Hypertension  Sister    .  Anesthesia problems  Brother    Review of Systems:  Cardiac Review of Systems: Y or N  Chest Pain [n ] Resting SOB [ n ] Exertional SOB [ y ] Orthopnea Cove.Etienne ]  Pedal Edema [ y ] Palpitations [ y ] Syncope [ n ] Presyncope [ n ]  General Review of Systems: [Y] = yes [ ] =no  Constitional: recent weight change [n ]; anorexia [ ] ; fatigue Cove.Etienne ]; nausea [ ] ; night sweats [ ] ; fever [ ] ; or chills [ ]   Dental: poor dentition[y ];  Eye : blurred vision [ ] ; diplopia [ ] ; vision changes [ ] ; Amaurosis fugax[ ] ;  Resp: cough [ ] ; wheezing[n ]; hemoptysis[ ] ; shortness of breath[ ] ; paroxysmal nocturnal dyspnea[y ]; dyspnea on exertion[y ]; or orthopnea[ ] ;  GI: gallstones[ ] , vomiting[ ] ; dysphagia[ ] ; melena[ ] ; hematochezia [ ] ; heartburn[ ] ; Hx of Colonoscopy[y ];  GU: kidney stones [ ] ; hematuria[ ] ; dysuria [ ] ; nocturia[ ] ; history of obstruction [ ] ; urinary frequency [ ]   Skin: rash, swelling[ ] ;, hair loss[ ] ; peripheral edema[ ] ; or itching[ ] ;  Musculosketetal: myalgias[ ] ; joint swelling[ ] ; joint erythema[ ] ;  joint pain[ ] ; back pain[ ] ;  Heme/Lymph: bruising[ ] ; bleeding[ ] ; anemia[ ] ;  Neuro: TIA[ n ]; headaches[ ] ; stroke[ ] ; vertigo[ ] ; seizures[ ] ; paresthesias[ ] ; difficulty walking[ ] ;  Psych:depression[ ] ; anxiety[ ] ;  Endocrine: diabetes[ ] ; thyroid dysfunction[ ] ;  Immunizations: Flu [ y ]; Pneumococcal[ n ];  Other:  Physical  Exam:  BP 121/75  Pulse 90  Temp(Src) 97.3 F (36.3 C) (Axillary)  Resp 18  Ht 5\' 5"  (1.651 m)  Wt 114 lb (51.71 kg)  BMI 18.97 kg/m2  SpO2 97%  General appearance: alert, cooperative and appears stated age  Neurologic: intact  Heart: irregularly irregular rhythm and diastolic murmur: holodiastolic 3/6, crescendo at lower left sternal border  Lungs: clear to auscultation bilaterally  Abdomen: soft, non-tender; bowel sounds normal; no masses, no organomegaly  Extremities: extremities normal, atraumatic, no cyanosis or edema, edema left lower leg and cath site rt groin without hematoma  Wound: left inner thigh scar or sarcoma resection and some superior swelling. skin discoloation due to radiation  Diagnostic Studies & Laboratory data:  ECHO:  ------------------------------------------------------------ LV EF: 50% - 55%  ------------------------------------------------------------ Indications: Atrial fibrillation - 427.31.  ------------------------------------------------------------ History: PMH: AI  Dialated Aortic Root Dizziness  Atrial fibrillation.  ------------------------------------------------------------ Study Conclusions  - Left ventricle: The cavity size was moderately dilated. Wall thickness was normal. Systolic function was normal. The estimated ejection fraction was in the range of 50% to 55%. Wall motion was normal; there were no regional wall motion abnormalities. - Aortic valve: Moderate regurgitation. Transthoracic echocardiography. M-mode, complete 2D, spectral Doppler, and color Doppler. Height: Height: 165.1cm. Height: 65in. Weight: Weight: 52.1kg. Weight: 114.6lb. Body mass index: BMI: 19.1kg/m^2. Body surface area: BSA: 1.27m^2. Blood pressure: 94/53. Patient status: Inpatient. Location: Bedside.  ------------------------------------------------------------  ------------------------------------------------------------ Left ventricle: The  cavity size was moderately dilated. Wall thickness was normal. Systolic function was normal. The estimated ejection fraction was in the range of 50% to 55%. Wall motion was normal; there were no regional wall motion abnormalities.  ------------------------------------------------------------ Aortic valve: Doppler: Moderate regurgitation.  ------------------------------------------------------------ Aorta: There was severe dilation of the sinus(es) of Valsalva.  ------------------------------------------------------------ Mitral valve: Structurally normal valve. Doppler: Trivial regurgitation.  ------------------------------------------------------------ Left atrium: The atrium was normal in size.  ------------------------------------------------------------ Right ventricle: The cavity size was normal. Systolic function was normal.  ------------------------------------------------------------ Pulmonic valve: Doppler: Trivial regurgitation.  ------------------------------------------------------------ Tricuspid valve: The valve appears to be grossly normal. Doppler: Trivial regurgitation.  ------------------------------------------------------------ Right atrium: The atrium was normal in size.  ------------------------------------------------------------ Pericardium: There was no pericardial effusion.  ------------------------------------------------------------  2D measurements Normal Doppler measurements Normal Left ventricle Left ventricle LVID ED, 52.4 mm 43-52 Ea, med ann, 5.04 cm/s ------ chord, tiss DP PLAX Aortic valve LVID ES, 34.1 mm 23-38 Regurg PHT 337 ms ------ chord, Tricuspid valve PLAX Regurg peak 225 cm/s ------ FS, chord, 35 % >29 vel PLAX Peak RV-RA 20 mm ------ LVPW, ED 9.94 mm ------ gradient, S Hg IVS/LVPW 1.26 <1.3 Right ventricle ratio, ED Sa vel, lat 10.2 cm/s ------ Ventricular septum ann, tiss DP IVS, ED 12.5 mm ------ LVOT Diam, S 20 mm  ------ Area 3.14 cm^2 ------ Aorta Root diam, 62 mm ------ ED Left atrium AP dim 33 mm ------ AP dim 2.14 cm/m^2 <2.2 index  ------------------------------------------------------------ Prepared and Electronically Authenticated by  Kristeen Miss 2014-03-15T12:15:[id number]  CATH:  Procedural Findings:  Hemodynamics  RA 5  RV 25/5  PA 26/14  PCWP 15  LV 130/13  AO 129/65 (89)f  Oxygen saturations:  SVC 55%  PA 63%  AO 96%  Cardiac Output (Fick) 2.87 L/min/m2  Cardiac Index (Fick) 1.81 L/min/m2  Cardiac Output (Thermo) 2.92 L/min  Cardiac Index (Thermo) 1.84 L/min/m2  Coronary angiography:  Coronary dominance: right  Left mainstem: Short and without obstruction  Left anterior descending (LAD): Moderate calcification with about 30% luminal irregularity proximally. No critical obstruction. The vessel terminates as an apical LAD and large distal diagonal.  Left circumflex (LCx): Consists mainly of a large OM branch. There is about 40% segmental plaquing just beyond the takeoff of the small AV circumflex.  Right coronary artery (RCA): Large caliber vessel with 30% proximal narrowing that is eccentric. There is a large caliber PDA and smaller PDA, both free of major disease.  Left ventriculography: There was ectopy with LV angiography, so assessment of LV function is not reliable. EF estimate would be 50%, but unreliable.  Aortography reveals a very large aortic root down involving the sinus of valsalva. There is mild calcification of the valve. There is moderately severe aortic regurgitation (  3 plus) noted.  Final Conclusions:  1. Aortic regurgitation, moderately severe  2. Dilated LV with likely reduction in LV function  3. Recent onset of atrial fibrillation  4. Moderate coronary calcification, without critical obstruction.  Recommendations:  1. Defer surgical decisions to patient with Dr Patty Sermons.  2. Rate control.  3. Given AI and wide pulse pressure, and very small size  will defer reanticoagulation to am. Would consider low dose heparin for 1-2 days before consideration a NOAC given AI and risk of bleeding.  Shawnie Pons MD, [facility], FSCAI  02/02/2013, 7:00 PM  Recent Radiology Findings:  Dg Chest 2 View  01/30/2013 *RADIOLOGY REPORT* Clinical Data: Shortness of breath with deep breath. History of atrial fibrillation and aortic insufficiency. CHEST - 2 VIEW Comparison: 10/29/2012 Findings: Borderline heart size with normal pulmonary vascularity. Emphysematous changes in the lungs. Slight interstitial fibrosis diffusely with mild focal scarring in the left lung base. This appears stable since previous study. No focal consolidation or airspace disease in the lungs. No blunting of costophrenic angles. No pneumothorax. Mediastinal contours appear intact. Degenerative changes in the thoracic spine. No significant change since previous study. IMPRESSION: Emphysematous changes and scarring in the lungs. No evidence of active pulmonary disease. Original Report Authenticated By: Burman Nieves, M.D.  MR THIGH/FEMUR LEFT WWO CONTRAST (TUMOR) (11/18/2012 8:52 AM EST)  Impressions   Likely evolving seroma/hematoma at the surgical site in the proximal medial left thigh which is unchanged in size in comparison to recent study from 10/07/2012 but larger in comparison to prior study from 01/08/2012.  Tumor recurrence cannot be completely excluded.  This could be further evaluated with percutaneous biopsy and/or followup MRI in 3 to 6 months.  Findings discussed with Dr. Jana Half by Dr. Revonda Standard at 10:00 a.m. on 11/18/2012.    Narrative   MR LEFT THIGH/FEMUR WITH AND WITHOUT CONTRAST (TUMOR PROTOCOL), Nov 18, 2012 08:52:00 AM  .  INDICATION: eval for tumor recurrence171.9 Myxoid liposarcoma  COMPARISON: Multiple prior most recently from 10/07/2012, and 01/08/2012.  Marland Kitchen  TECHNIQUE: Multiplanar, multisequence MR images of the left thigh/femur were obtained before and after intravenous  administration of gadolinium-based contrast.  .  FINDINGS:  Postsurgical changes of tumor resection within the medial aspect of the upper thigh with associated architectural distortion of the soft tissues.  There is a 3.4 x 2.9 x 5.8 cm postoperative collection within the resection bed (series 10 image 33 series 7 image 13 ).  It measured 2.4 x 2.1 x 6.5 cm on prior examination from 12/29/2011 but is unchanged from 10/07/2012.  There is a dark peripheral rim (low on T1 and T2) suggesting hemosiderin.  There is an internal high T1 signal component which may related to blood products/hemorrhage (series 16, image 33).  There is no surrounding abnormal increased T2 signal. No visualized lymphadenopathy. No nodular or masslike enhancement. No new lesion.  Similar feathery increased T2 signal surrounding the femoral vessels and within the remaining adductors likely representing posttreatment changes.  Patchy marrow signal within the femoral diaphysis like related to post radiation changes.  Superior and anterosuperior chondrolabral separation and degenerative changes of the labrum.  Mild degenerative changes of the pubic symphysis and left sacroiliac joint.  Colonic diverticulosis without MR evidence of diverticulitis.  Please note the study is not tailored to the intrinsic structures of the knee or hip.   CT of Chest abdomen:  Result Narrative  CT INFUSED CHEST, ABDOMEN AND PELVIS, Oct 07, 2012 11:38:00 AM . INDICATION: eval for chest mets171.9  Myxoid liposarcoma  COMPARISON: Most recent CT CAP 01/08/2012. Marland Kitchen TECHNIQUE: After administration of oral contrast and water, 125 cc of IV contrast material were infused, and dynamic thin-section images were obtained of the chest, abdomen, and pelvis in the portal venous phase at approximately 60 sec.  . ADDITIONAL TECHNIQUE: None . CHEST: . Chest wall/thoracic inlet: Within normal limits. . Thyroid: Within normal limits. . Mediastinum/hila: No mediastinal  or hilar adenopathy. Patulous esophagus. Marland Kitchen Heart/vessels: Mild cardiomegaly. Trace pericardial effusion . Aneurysmal dilation of the aortic root and ascending aorta measuring 4.7 cm maximally, similar to recent imaging from February 2013. Atherosclerotic calcifications of the aorta and coronary arteries. . Lungs: Biapical pleural parenchymal scarring as well as scarring within the lung bases and lingula. No suspicious appearing pulmonary nodules. Stable appearance of small pulmonary nodules dating back over 2 years, likely reflecting benign disease. No consolidative changes. . Pleura: Within normal limits. . ABDOMEN: . Liver: Similar appearance of the liver with scattered peripherally enhancing lesions, most consistent with hemangiomas. There are additional too small to characterize low attenuating lesions again noted. . Gallbladder/Biliary: Within normal limits. . Spleen: Within normal limits. . Pancreas: Within normal limits. . Adrenals: Within normal limits. . Kidneys: Scattered too small to characterize low attenuating lesions the kidneys. . Peritoneum/Mesenteries: Within normal limits. . Extraperitoneum: Within normal limits. . Gastrointestinal tract: Colonic diverticulosis. No bowel obstruction. Normal appendix. . Vascular: Aortobiiliac atherosclerotic calcifications without aneurysm. Marland Kitchen PELVIS: . Peritoneum: Within normal limits. . Extraperitoneum: Within normal limits. . Ureters: Within normal limits. . Bladder: Within normal limits. . Reproductive System: Hysterectomy. . Vascular: Prominent venous collaterals in the pelvis, as can be seen with pelvic congestion syndrome. Ectasia of the left common iliac artery just proximal to its bifurcation measuring 18 mm, similar to prior (image 177). . MSK: No acute osseous abnormality. Degenerative changes of the spine. Osteopenia. . ADDITIONAL COMMENTS: None. .  Recent Lab Findings:  Lab Results   Component  Value  Date    WBC  6.4   02/03/2013    HGB  13.0  02/03/2013    HCT  37.9  02/03/2013    PLT  144*  02/03/2013    GLUCOSE  101*  02/02/2013    ALT  60*  01/30/2013    AST  35  01/30/2013    NA  141  02/02/2013    K  3.7  02/02/2013    CL  107  02/02/2013    CREATININE  0.61  02/02/2013    BUN  16  02/02/2013    CO2  25  02/02/2013    TSH  2.187  01/30/2013    INR  1.00  01/30/2013   Assessment / Plan:    Dilated aortic root 6.2 cm With mod/severe AI and Short left main without coronary occlusive disease  Aortic Size Index=    6.2     /Body surface area is 1.57 meters squared. =3.9  < 2.75 cm/m2      4% risk per year 2.75 to 4.25          8% risk per year > 4.25 cm/m2    20% risk per year    New onset Afib  History of Liposarcoma low grade respected and treated with preop radiation   Stable pulmonary nodules by CT scan done at [facility]   I have discussed the risks and options of aortic root descending aorta and aortic valve replacement with the patient and her husband. I discussed discussed with  the patient with the current size of her aorta that the risk of an acute event may be 20% per year, independent of the moderate to severe aortic insufficiency. At this point the patient has declined having any operative intervention. I have recommended to her that she think this over further and return to see me in several weeks to make sure it the cyst the decision she wants to proceed with.  Delight Ovens MD  Beeper [phone]  Office [phone]  02/03/2013 12:06 PM

## 2013-03-10 ENCOUNTER — Other Ambulatory Visit: Payer: Self-pay | Admitting: *Deleted

## 2013-03-10 ENCOUNTER — Encounter: Payer: Self-pay | Admitting: Cardiology

## 2013-03-10 ENCOUNTER — Ambulatory Visit (INDEPENDENT_AMBULATORY_CARE_PROVIDER_SITE_OTHER): Payer: Medicare Other | Admitting: Cardiology

## 2013-03-10 ENCOUNTER — Ambulatory Visit (INDEPENDENT_AMBULATORY_CARE_PROVIDER_SITE_OTHER): Payer: Medicare Other

## 2013-03-10 VITALS — BP 128/66 | HR 86 | Ht 65.0 in | Wt 120.8 lb

## 2013-03-10 DIAGNOSIS — I4891 Unspecified atrial fibrillation: Secondary | ICD-10-CM

## 2013-03-10 DIAGNOSIS — R030 Elevated blood-pressure reading, without diagnosis of hypertension: Secondary | ICD-10-CM

## 2013-03-10 DIAGNOSIS — I77810 Thoracic aortic ectasia: Secondary | ICD-10-CM

## 2013-03-10 DIAGNOSIS — I5033 Acute on chronic diastolic (congestive) heart failure: Secondary | ICD-10-CM

## 2013-03-10 DIAGNOSIS — Z7901 Long term (current) use of anticoagulants: Secondary | ICD-10-CM

## 2013-03-10 DIAGNOSIS — I480 Paroxysmal atrial fibrillation: Secondary | ICD-10-CM

## 2013-03-10 LAB — CBC
HCT: 39.2 % (ref 36.0–46.0)
Hemoglobin: 13.2 g/dL (ref 12.0–15.0)
MCHC: 33.7 g/dL (ref 30.0–36.0)
MCV: 94.7 fl (ref 78.0–100.0)
Platelets: 170 10*3/uL (ref 150.0–400.0)

## 2013-03-10 LAB — BASIC METABOLIC PANEL
CO2: 35 mEq/L — ABNORMAL HIGH (ref 19–32)
Calcium: 9.3 mg/dL (ref 8.4–10.5)
Chloride: 102 mEq/L (ref 96–112)
Glucose, Bld: 78 mg/dL (ref 70–99)
Potassium: 4 mEq/L (ref 3.5–5.1)
Sodium: 138 mEq/L (ref 135–145)

## 2013-03-10 NOTE — Assessment & Plan Note (Signed)
Blood pressure appears to have stabilized on current medication

## 2013-03-10 NOTE — Progress Notes (Signed)
Quick Note:  Please report to patient. The recent labs are stable. Continue same medication and careful diet. ______ 

## 2013-03-10 NOTE — Assessment & Plan Note (Signed)
The patient severe dilatation of her aortic root.  She has been considering surgery and is leaning toward surgery now.  She has not been having any chest discomfort to suggest aortic dissection

## 2013-03-10 NOTE — Progress Notes (Signed)
Pt was started on Xarelto 20mg  once daily for atrial fibrillation on 02/04/13.  Reviewed patients medication list.  Pt is not currently on any combined P-gp and strong CYP3A4 inhibitors/inducers (ketoconazole, traconazole, ritonavir, carbamazepine, phenytoin, rifampin, St. John's wort).  Reviewed labs.  SCr 0.7 , Weight 54.8kg, CrCl 34ml/min .  Dose appropriate based on CrCl.   Hgb and HCT WNL per labs drawn on 03/10/13.    Pt called and advised to continue on same dosage f/u in 6 months.

## 2013-03-10 NOTE — Progress Notes (Signed)
Kayla Chambers Date of Birth:  12/08/1926 Kayla [facility] [address] [place], [place]  [address] [phone]         Fax   [phone]  History of Present Illness: This pleasant 79 year old woman is seen for a followup office visit. The patient was admitted to the [facility] on 01/30/13 and discharged on 02/04/13. She has a past history of known severe aortic insufficiency with dilatation of the ascending aortic root. Her most recent echocardiogram showed an aortic root dimension of 61 mm at the sinuses of Valsalva. She was admitted on 01/30/13 with increasing weakness shortness of breath and new recognized atrial fibrillation with rapid ventricular response. She has a past history of acute on chronic diastolic congestive heart failure. She has a past history of previous sarcoma of the left leg and is followed closely at [facility] for this and has another appointment to see her orthopedic cancer surgeon in 3 more months. On her recent hospitalization the patient underwent cardiac catheterization which showed moderate coronary calcification without critical obstruction on 02/02/13. Two-dimensional echocardiogram 01/31/13 showed moderately dilated left ventricular cavity size with normal wall status and normal systolic function with ejection fraction estimated at 50-55% and no regional wall motion abnormalities. There was moderate aortic valve regurgitation. In the [facility] she was seen in consultation by Dr. Tyrone Sage.  She has another appointment to see Dr. Tyrone Sage on May 1.   Because of her recent atrial fibrillation the patient is on Xarelto 15 mg daily.   Current Outpatient Prescriptions  Medication Sig Dispense Refill  . ALPRAZolam (XANAX) 0.5 MG tablet Take 0.5 tablets (0.25 mg total) by mouth at bedtime as needed. sleep  30 tablet  5  . b complex vitamins tablet Take 1 tablet by mouth daily.      . Calcium Carbonate-Vitamin D (CALCIUM + D PO) Take 1 tablet by mouth 2  (two) times daily.       Marland Kitchen CRANBERRY PO Take 1 tablet by mouth 2 (two) times daily.       Marland Kitchen diltiazem (CARDIZEM CD) 120 MG 24 hr capsule Take 1 capsule (120 mg total) by mouth daily.  30 capsule  6  . fish oil-omega-3 fatty acids 1000 MG capsule Take 1 g by mouth 2 (two) times daily.       Marland Kitchen levothyroxine (SYNTHROID, LEVOTHROID) 75 MCG tablet Take 75 mcg by mouth daily.        Marland Kitchen lisinopril (PRINIVIL,ZESTRIL) 2.5 MG tablet Take 1 tablet (2.5 mg total) by mouth daily.  30 tablet  6  . Loperamide HCl (LOPERAMIDE A-D PO) Take 1 tablet by mouth as needed. Take by mouth. as needed for diarrhea      . metoprolol tartrate (LOPRESSOR) 25 MG tablet Take 0.5 tablets (12.5 mg total) by mouth 2 (two) times daily.  60 tablet  6  . multivitamin (THERAGRAN) per tablet Take 1 tablet by mouth daily.        . Probiotic Product (PROBIOTIC DAILY PO) Take 1 capsule by mouth daily.       . Rivaroxaban (XARELTO) 20 MG TABS Take 20 mg by mouth daily.       No current facility-administered medications for this visit.    Allergies  Allergen Reactions  . Alendronate Sodium Nausea And Vomiting  . Ambien (Zolpidem Tartrate)     hallucinations  . Lyrica (Pregabalin) Nausea And Vomiting  . Neurontin (Gabapentin) Nausea And Vomiting  . Procaine Hcl   . Toprol Xl (Metoprolol Succinate)  Other (See Comments)    Does not remember     Patient Active Problem List  Diagnosis  . Aortic insufficiency  . Dilated aortic root  . Sarcoma  . Hypothyroidism  . Polymyalgia rheumatica  . Peripheral neuropathy  . Dizziness  . Elevated blood pressure  . Atrial fibrillation  . Acute on chronic diastolic heart failure  . Disorder of liver  . Solitary pulmonary nodule  . 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 Never Smoker   Smokeless tobacco  . Never Used    History  Alcohol Use No    Family History  Problem Relation Age of Onset  . Heart disease Mother   . Stroke Mother     . Arthritis Father   . Cancer Father   . Hypertension Sister   . Anesthesia problems Brother     Review of Systems: Constitutional: no fever chills diaphoresis or fatigue or change in weight.  Head and neck: no hearing loss, no epistaxis, no photophobia or visual disturbance. Respiratory: No cough, shortness of breath or wheezing. Cardiovascular: No chest pain peripheral edema, palpitations. Gastrointestinal: No abdominal distention, no abdominal pain, no change in bowel habits hematochezia or melena. Genitourinary: No dysuria, no frequency, no urgency, no nocturia. Musculoskeletal:No arthralgias, no back pain, no gait disturbance or myalgias. Neurological: No dizziness, no headaches, no numbness, no seizures, no syncope, no weakness, no tremors. Hematologic: No lymphadenopathy, no easy bruising. Psychiatric: No confusion, no hallucinations, no sleep disturbance.    Physical Exam: Filed Vitals:   03/10/13 1421  BP: 128/66  Pulse: 86   the general appearance reveals an alert elderly woman in no distress.The head and neck exam reveals pupils equal and reactive.  Extraocular movements are full.  There is no scleral icterus.  The mouth and pharynx are normal.  The neck is supple.  The carotids reveal no bruits.  The jugular venous pressure is normal.  The  thyroid is not enlarged.  There is no lymphadenopathy.  The chest is clear to percussion and auscultation.  There are no rales or rhonchi.  Expansion of the chest is symmetrical.  The precordium is quiet.  The first heart sound is normal.  The second heart sound is physiologically split.  There is no  gallop rub or click.  There is a grade 3/6 decrescendo murmur of aortic insufficiency which is heard along the left sternal edge and right sternal edge.  There is no abnormal lift or heave.  The abdomen is soft and nontender.  The bowel sounds are normal.  The liver and spleen are not enlarged.  There are no abdominal masses.  There are no  abdominal bruits.  Extremities reveal good pedal pulses.  There is no phlebitis or edema.  There is no cyanosis or clubbing.  Strength is normal and symmetrical in all extremities.  There is no lateralizing weakness.  There are no sensory deficits.  The skin is warm and dry.  There is no rash.   EKG today shows normal sinus rhythm with PACs and demonstrates left axis deviation and nonspecific ST-T wave changes and LVH with strain pattern, unchanged since 02/10/13  Assessment / Plan: Continue on medications.  Keep appointment with Dr. Tyrone Sage to discuss surgery.  The office visit with him is on may first.  Recheck here in 6 weeks for followup office visit

## 2013-03-10 NOTE — Patient Instructions (Signed)

## 2013-03-10 NOTE — Patient Instructions (Addendum)
Your physician recommends that you continue on your current medications as directed. Please refer to the Current Medication list given to you today.  Your physician recommends that you schedule a follow-up appointment in: 6 week ov

## 2013-03-10 NOTE — Assessment & Plan Note (Signed)
The patient's dyspnea has improved since discharge from the hospital.  She still tires easily

## 2013-03-10 NOTE — Assessment & Plan Note (Signed)
The patient has not had any recurrence of her atrial fibrillation since discharge from the hospital.  She has occasional premature atrial beats.  She is not having any side effects from the Xarelto.

## 2013-03-11 MED ORDER — RIVAROXABAN 20 MG PO TABS: 20.0000 mg | ORAL_TABLET | Freq: Every day | ORAL | Status: DC

## 2013-03-13 ENCOUNTER — Telehealth: Payer: Self-pay | Admitting: Cardiology

## 2013-03-13 NOTE — Telephone Encounter (Signed)
Advised husband of labs 

## 2013-03-13 NOTE — Telephone Encounter (Signed)
Message copied by Burnell Blanks on Fri Mar 13, 2013 12:19 PM ------      Message from: Cassell Clement      Created: Tue Mar 10, 2013  9:51 PM       Please report to patient.  The recent labs are stable. Continue same medication and careful diet. ------

## 2013-03-13 NOTE — Telephone Encounter (Signed)
New Problem:    Patient's husband returned your call regarding his wife's lab results.  Please call back.

## 2013-03-19 ENCOUNTER — Other Ambulatory Visit: Payer: Self-pay | Admitting: *Deleted

## 2013-03-19 ENCOUNTER — Encounter: Payer: Self-pay | Admitting: Cardiothoracic Surgery

## 2013-03-19 ENCOUNTER — Ambulatory Visit (INDEPENDENT_AMBULATORY_CARE_PROVIDER_SITE_OTHER): Payer: Medicare Other | Admitting: Cardiothoracic Surgery

## 2013-03-19 VITALS — BP 116/60 | HR 55 | Resp 18 | Ht 65.0 in | Wt 120.0 lb

## 2013-03-19 DIAGNOSIS — I712 Thoracic aortic aneurysm, without rupture: Secondary | ICD-10-CM

## 2013-03-19 DIAGNOSIS — I77810 Thoracic aortic ectasia: Secondary | ICD-10-CM

## 2013-03-19 NOTE — Patient Instructions (Addendum)
Stop Xarelto May 8 Stop lisinopril  May 11 Nothing by mouth after midnight before surgery    Aortic Valve Replacement You have a disease of one of the valves of your heart. In you or your child's case, it is the aortic valve which needs replacing. Aortic valve replacement is open heart surgery done by a heart surgeon. This operation treats problems with the aortic valve. The aortic valve is the "outflow valve" for the left side of the heart. The left side of your heart (left ventricle) is the large muscular part of the heart that pumps blood to the rest of the body. It separates the left ventricle from the aorta. When the heart squeezes down (contracts), the aortic valve is what keeps the blood from flowing back into the ventricle from the aorta. This allows the blood to keep moving through the body.  Surgery may be necessary when the valve does not open or close completely. A stenotic (narrow) valve does not let the blood leave the heart normally. This causes blood to back up in the left ventricle. This makes it hard for the heart to increase the amount of blood that it pumps. The heart has to work harder. This may produce shortness of breath and fatigue. Problems are worse with activity.  If the valve leaflets do not meet correctly when closing, blood may leak backward into the ventricle each time the heart pumps. This is called aortic insufficiency. When some of the blood leaks backwards, the heart has to work even harder. The heart can allow for this over-work for a long time if the leakage came on slowly. Eventually, the heart fails.  Aortic valve problems may be caused by a birth defect. This is called congenital. Wear and tear can cause valves to fail. More commonly, rheumatic fever may damage the aortic valve. Occasionally, the valve may be damaged by infection. This also causes the aortic valve to leak.  DESCRIPTION OF SURGERY Aortic valves can be repaired. When the valve is too damaged to  repair, the valve must be replaced. A prosthetic (artificial) valve is used to do this. Valves damaged by rheumatic disease often must be replaced.  Two types of artificial valves are available:  Mechanical valves made entirely from man-made materials.  Biological valves which are made from animal tissues or taken from a cadaver. Each has advantages and disadvantages. The choice of which type to use should be made by you and your surgeon. Your risks, age, lifestyle, other medical problems including the decision on whether to be on blood thinners the rest of your life all will help you decide on which type of valve to use. There are a number of good MECHANICAL PROSTHESES available. All work well. The main advantage of mechanical valves is that they do not wear out. Their main disadvantage is that blood clots easier on mechanical valves. If this happens the valve will not work normally. Because of this, patients with mechanical valves must take anticoagulants (blood thinners) for life. There is also a small but definite risk of blood clots causing stroke, even when taking anticoagulants.  There are a number of BIOLOGICAL CHOICES for aortic valve replacement. Most are made from pig aortic valves. Some are taken from cadavers. The main advantage is that they have a reduced risk of blood clots forming on the valve. This lessens the chance of the valve not working or causing a stroke. A large disadvantage of biological or tissue valves is that they wear out sooner  than mechanical valves. The rate at which they wear out depends on the patient's age. A young boy might wear out such a valve in only a few years. The same valve might last 10 years in a middle aged person, and even longer in a patient over the age of 48. A tissue valve used in a person over 90 years old may never need replacement. RISKS AND COMPLICATIONS Your cardiologist and cardiothoracic surgeon can best determine your individual risk. It will  depend on your age, general condition, medical conditions, and your heart function. In general, the risks include:  Problems from the operation itself are low risk. Some common risks are:  Risks from the anesthesia.  Bleeding and infection.  Lifelong treatment with medications to prevent blood clots is needed for mechanical valve replacements.  Infection is more common with valve replacement than with valve repair.  Valve failure is more common with valve replacement than with valve repair. Pig valves tend to fail after about 8 to 10 years. PROCEDURE  Valve repair or replacement is open-heart surgery. You are given general anesthesia (medications to help you sleep). You are then placed on a heart-lung machine. This machine provides oxygen to your blood while the heart is not working. The surgery generally lasts from 3 to 5 hours. During surgery, the surgeon makes a large incision (cut) in the chest. Sometimes the heart is cooled to slow or stop the heartbeat. The damaged aortic valve is either repaired or removed and replaced with an artificial heart valve. AFTER THE PROCEDURE  Recovery from heart valve surgery usually involves a few days in an intensive care unit (ICU) of a hospital. Full recovery from heart valve surgery can take several months.  Anticoagulation (blood thinning) treatment with warfarin is often prescribed for 6 weeks to 3 months after surgery for those with biological valves. It is prescribed for life for those with mechanical valves.  Recovery includes healing of the surgical incision. There is a gradual building of stamina and exercise abilities. An exercise program under the direction of a physical therapist may be recommended.  Once you have an artificial valve, your heart function and your life will return to normal. You usually feel better after surgery. Shortness of breath and fatigue should lessen. If your heart was already severely damaged before your surgery, you  may continue to have problems.  You can usually resume most of your normal activities. You will have to continue to monitor your condition. You need to watch out for blood clots and infections.  Artificial valves need to be replaced after a period of time. It is important that you see your caregiver regularly.  Some individuals with an aortic valve replacement need to take antibiotics before having dental work or other surgical procedures. This is called prophylactic antibiotic treatment. These drugs help to prevent infective endocarditis. Antibiotics are only recommended for individuals with the highest risk for developing infective endocarditis. Let your dentist and your caregiver know if you have a history of any of the following so that the necessary precautions can be taken:  A VSD.  A repaired VSD.  Endocarditis in the past.  An artificial (prosthetic) heart valve. HOME CARE INSTRUCTIONS   Use all medications as prescribed.  Take your temperature every morning for the first week after surgery. Record these.  Weigh yourself every morning for at least the first week after surgery and record.  Do not lift more than 10 pounds (4.5 kg) until your sternum (breastbone) has  healed. Avoid all activities which would place strain on your incision.  You may shower but do not take baths until instructed by your caregivers.  Avoid driving for 4 to 6 weeks following surgery or as instructed.  Use your elastic stockings during the day. You should wear the stockings for at least 2 weeks after discharge or longer if your ankles are swollen. The stockings help blood flow and help reduce swelling in the legs. It is easiest to put the stockings on before you get out of bed in the morning. They should fit snugly. SEEK IMMEDIATE MEDICAL CARE IF:  You develop chest pain which is not coming from your incision (surgical cut) .  You develop shortness of breath.  You develop a temperature over 101 F  (38.3 C).  You have a sudden weight gain. Let your caregiver know what the weight gain is. Document Released: 03/27/2005 Document Revised: 01/28/2012 Document Reviewed: 11/01/2008 [facility] Patient Information 2013 [place], [place]. Thoracic Aortic Aneurysm An aneurysm is the enlargement (dilatation), bulging or ballooning out of part of the wall of a vein or artery. An Aortic Aneurysm is a bulging in the largest artery of the body. This artery supplies blood from the heart to the rest of the body. The first part of the aorta is called the thoracic aorta. It leaves the heart, ascends (rises), arches, and descends (goes down) through the chest until it reaches the diaphragm (the muscular partition between the chest and abdomen (belly). The second part of the aorta is then called the abdominal aorta after it has passed the diaphragm and continues down through the abdomen. The abdominal aorta ends where it splits to form the two iliac arteries that go to the legs. Aortic aneurysms can develop anywhere along the length of the aorta. A thoracic aortic aneurysm (TAA) occurs in the first part of the aorta, between the heart and the diaphragm. The major importance of an aneurysm is that it can rupture or tear (dissect), causing death unless diagnosed and treated promptly. CAUSES  Most thoracic aortic aneurysms are related to arteriosclerosis. The arteriosclerosis can weaken the aortic wall. The pressure of the blood being pumped through the aorta causes it to balloon out at the site of weakness. Therefore, elevated blood pressure (hypertension) is associated with aneurysm. Other risk factors include:  Age over 56.  Tobacco use.  Female sex.  Family history of aneurysm. Additional causes of thoracic aortic aneurysms include:  Genetics (passed by birth).  Injury: After physical trauma to the aorta.  Inflammation of blood vessels.  Hardening of the arteries.  Infection. SYMPTOMS  Many aneurysms do  not cause problems. A small, unchanging or slowly changing aneurysm may produce no symptoms until it suddenly ruptures or dissects (separation of the layers of the aortic wall) without warning. It may then cause death. The symptoms (problems) of a developing aneurysm will partly depend on its size and rate of growth. Thoracic aortic aneurysms may cause pain in the:  Chest.  Back.  Sides.  Abdomen. The pain most often has a deep quality as if it is boring into the person. It may cause:  Heart failure.  Heart attack.  Hoarseness.  Cough.  Shortness of breath.  Swallowing problems. DIAGNOSIS  A thoracic aortic aneurysm may be suspected based on your symptoms. It may also be detected by x-ray or CT studies done for unrelated reasons.  Several different imaging studies can be used to confirm a TAA:  An echocardiogram is an ultrasound test  to examine the heart. It can also examine the first parts of the aorta. Sometimes, this test is done by putting you to sleep and inserting a flexible telescope through your mouth into your esophagus, which is next to the aorta; excellent pictures of the aorta can be obtained. This is called a transesophageal echocardiogram (TEE).  CT scanning of the chest is accurate at showing the exact size and shape of the aneurysm.  MRI scanning is accurate, and is used for certain types of TAA.  An aortic angiogram shows the source of the major blood vessels arising from the aorta. It reveals the size and extent of any aneurysm. It can also show a clot clinging to the wall of the aneurysm (mural thrombus). The angiogram may give information about a tear of the aorta. TREATMENT  Treatment for a thoracic aortic aneurysm depends on:  Location.  Size.  Other factors.  Rate of growth.  Underlying cause. Medical treatment is used for smaller or complicated aneurysms, or those that do not cause symptoms. These include:  Stopping smoking.  Blood pressure  control.  Control of cholesterol. Surgical treatment is used for aneurysms that cause symptoms, or for those that are large or growing in size. The surgical technique depends on the location of the aneurysm. HOME CARE INSTRUCTIONS   If you smoke, stop. If you do not, do not start.  Take all medications as prescribed.  Your caregiver will tell you when to have your aneurysm rechecked, either by echocardiogram or CT scan. Be sure to keep this and all follow-up appointments. SEEK MEDICAL CARE IF:   You develop mild pain in your chest, upper back, sides, or abdomen.  You develop cough, hoarseness or trouble swallowing. SEEK IMMEDIATE MEDICAL CARE IF:   You develop severe chest or abdominal pain, or severe pain moving (radiating) to your back.  You suddenly develop cold or blue toes or feet.  You suddenly develop lightheadedness or fainting spells.  You develop trouble breathing. Document Released: 11/05/2005 Document Revised: 01/28/2012 Document Reviewed: 09/24/2007 [facility] Patient Information 2013 [place], [place].

## 2013-03-19 NOTE — Progress Notes (Signed)
[address]  [place] [address]  [phone]  ETNA FORQUER  [facility] Medical Record #[id number]  Date of Birth: [birth year]  Referring: Dr Ronny Flurry  Primary Care: [name], MD  Ortho: Dr. Tamsen Meek  Chief Complaint: SOB and new Afib  History of Present Illness:   Patient is an 79 year old female followed by Dr. Ronny Flurry. 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 She presented to the office with increasing nocturnal dyspnea orthopnea and rapid heart rate. She was found to be in new onset of atrial fibrillation and was hospitalized. Followup including echocardiogram and cardiac catheterization confirms significant dilatation of the aortic root( 6.2 cm by echo) and moderate to moderately severe aortic insufficiency. She has a history of rheumatic fever at age 89. She's had no previous history of myocardial infarction. 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  She had a followup scan of the lower extremity at. [facility] and was seen by orthopedic surgery , the note note indicated that there was some slight increase in size of the mass at the old excision site but was too small to biopsy at this time .  With the degree of aortic insufficiency and the size of her aortic root cardiac surgery has been recommended to her . In the past she had told Dr. Patty Sermons she was not interested in any surgical intervention but now is willing to discuss the treatment options.    Current Activity/ Functional Status:  Patient is independent with mobility/ambulation, transfers, ADL's, IADL's.  Zubrod Score:  At the time of surgery this patient's most appropriate activity status/level should be described as:  [ ]  Normal activity, no symptoms  [X]  Symptoms, fully ambulatory  [ ]  Symptoms, in bed less than or equal to 50% of the time  [ ]  Symptoms, in bed greater than 50% of the time but less than 100%  [ ]   Bedridden  [ ]  Moribund  Past Medical History   Diagnosis  Date   .  Hypothyroidism    .  Heart palpitations    .  Dilated aortic root      Marked dilatation of aortic root per echo in 2011 with severe AI, mild to moderate MR, moderate TR   .  Aortic insufficiency      severe per echo in 2012   .  Polymyalgia rheumatica    .  Vertigo    .  Peripheral neuropathy    .  Intracranial aneurysm      in late 1970's   .  Osteoporosis    .  Rheumatic heart disease    .  Heart murmur    .  Hyperthyroidism    .  Headache      hx migraines   .  Anxiety    .  Dysrhythmia    .  Fibromyalgia    .  Dilated aortic root 6.2 cm with moderate AI  03/06/2011   .  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 Neoadjuvant RT by Dr. Abelardo Diesel. Surveillance plan: MRI 12/08/11 shows changes in presumed post-op seroma/hematoma with slight increase in size - follow up MRI 11/18/12 showed same size. Will continue with q3 month MRI. CT C/A/P annually alternating with CXR at 6 months.    Past Surgical History  Procedure  Laterality  Date   .  Sarcoma resection   2011   .  Abdominal hysterectomy     .  Bladder suspension     .  Eye surgery     .  Colonoscopy   10/25/2011     Procedure: COLONOSCOPY; Surgeon: Charolett Bumpers, MD; Location: [facility]; Service: Endoscopy; Laterality: N/A;    History   Smoking status   .  Never Smoker   Smokeless tobacco   .  Never Used    History   Alcohol Use  No    Allergies   Allergen  Reactions   .  Alendronate Sodium  Nausea And Vomiting   .  Lyrica (Pregabalin)  Nausea And Vomiting   .  Neurontin (Gabapentin)  Nausea And Vomiting   .  Procaine Hcl    .  Toprol Xl (Metoprolol Succinate)  Other (See Comments)     Does not remember    Current Facility-Administered Medications   Medication  Dose  Route  Frequency  Provider  Last Rate  Last  Dose   .  0.9 % sodium chloride infusion  250 mL  Intravenous  PRN  Cassell Clement, MD     .  acetaminophen (TYLENOL) tablet 650 mg  650 mg  Oral  Q4H PRN  Cassell Clement, MD     .  ALPRAZolam Prudy Feeler) tablet 0.25 mg  0.25 mg  Oral  BID PRN  Cassell Clement, MD     .  aspirin EC tablet 81 mg  81 mg  Oral  Daily  Cassell Clement, MD   81 mg at 02/03/13 0954   .  diltiazem (CARDIZEM) tablet 30 mg  30 mg  Oral  Q6H  Ok Anis, NP   30 mg at 02/03/13 1610   .  furosemide (LASIX) tablet 20 mg  20 mg  Oral  Daily  Cassell Clement, MD   20 mg at 02/03/13 0954   .  heparin injection 5,000 Units  5,000 Units  Subcutaneous  Q8H  Cassell Clement, MD     .  levothyroxine (SYNTHROID, LEVOTHROID) tablet 75 mcg  75 mcg  Oral  QAC breakfast  Cassell Clement, MD   75 mcg at 02/03/13 0954   .  lisinopril (PRINIVIL,ZESTRIL) tablet 2.5 mg  2.5 mg  Oral  Daily  Cassell Clement, MD   2.5 mg at 02/03/13 0954   .  metoprolol tartrate (LOPRESSOR) tablet 12.5 mg  12.5 mg  Oral  BID  Cassell Clement, MD   12.5 mg at 02/03/13 0954   .  ondansetron (ZOFRAN) injection 4 mg  4 mg  Intravenous  Q6H PRN  Cassell Clement, MD     .  sodium chloride 0.9 % injection 3 mL  3 mL  Intravenous  PRN  Cassell Clement, MD     .  sodium chloride 0.9 % injection 3 mL  3 mL  Intravenous  PRN  Cassell Clement, MD     .  zolpidem (AMBIEN) tablet 5 mg  5 mg  Oral  QHS PRN  Dayna N Dunn, PA-C   5 mg at 02/02/13 2146    Prescriptions prior to admission   Medication  Sig  Dispense  Refill   .  ALPRAZolam (XANAX) 0.5 MG tablet  Take 0.5 tablets (0.25 mg total) by mouth at bedtime as needed. sleep  30 tablet  5   .  aspirin EC 81 MG tablet  Take 81 mg by  mouth daily.     Marland Kitchen  atenolol (TENORMIN) 25 MG tablet  Take 25 mg by mouth at bedtime.     Marland Kitchen  b complex vitamins tablet  Take 1 tablet by mouth daily.     .  Calcium Carbonate-Vitamin D (CALCIUM + D PO)  Take 1 tablet by mouth 3 (three) times daily.     Marland Kitchen  CRANBERRY PO  Take  1 tablet by mouth 3 (three) times daily.     .  fish oil-omega-3 fatty acids 1000 MG capsule  Take 1 g by mouth 2 (two) times daily.     Marland Kitchen  levothyroxine (SYNTHROID, LEVOTHROID) 75 MCG tablet  Take 75 mcg by mouth daily.     .  multivitamin (THERAGRAN) per tablet  Take 1 tablet by mouth daily.     .  Probiotic Product (PROBIOTIC DAILY PO)  Take 1 capsule by mouth daily.      Family History   Problem  Relation  Age of Onset   .  Heart disease  Mother    .  Stroke  Mother    .  Arthritis  Father    .  Cancer  Father    .  Hypertension  Sister    .  Anesthesia problems  Brother    Review of Systems:  Cardiac Review of Systems: Y or N  Chest Pain [n ] Resting SOB [ n ] Exertional SOB [ y ] Orthopnea Cove.Etienne ]  Pedal Edema [ y ] Palpitations [ y ] Syncope [ n ] Presyncope [ n ]  General Review of Systems: [Y] = yes [ ] =no  Constitional: recent weight change [n ]; anorexia [ ] ; fatigue Cove.Etienne ]; nausea [ ] ; night sweats [ ] ; fever [ ] ; or chills [ ]   Dental: poor dentition[y ];  Eye : blurred vision [ ] ; diplopia [ ] ; vision changes [ ] ; Amaurosis fugax[ ] ;  Resp: cough [ ] ; wheezing[n ]; hemoptysis[ ] ; shortness of breath[ ] ; paroxysmal nocturnal dyspnea[y ]; dyspnea on exertion[y ]; or orthopnea[ ] ;  GI: gallstones[ ] , vomiting[ ] ; dysphagia[ ] ; melena[ ] ; hematochezia [ ] ; heartburn[ ] ; Hx of Colonoscopy[y ];  GU: kidney stones [ ] ; hematuria[ ] ; dysuria [ ] ; nocturia[ ] ; history of obstruction [ ] ; urinary frequency [ ]   Skin: rash, swelling[ ] ;, hair loss[ ] ; peripheral edema[ ] ; or itching[ ] ;  Musculosketetal: myalgias[ ] ; joint swelling[ ] ; joint erythema[ ] ;  joint pain[ ] ; back pain[ ] ;  Heme/Lymph: bruising[ ] ; bleeding[ ] ; anemia[ ] ;  Neuro: TIA[ n ]; headaches[ ] ; stroke[ ] ; vertigo[ ] ; seizures[ ] ; paresthesias[ ] ; difficulty walking[ ] ;  Psych:depression[ ] ; anxiety[ ] ;  Endocrine: diabetes[ ] ; thyroid dysfunction[ ] ;  Immunizations: Flu [ y ]; Pneumococcal[ n ];  Other:  Physical  Exam:  BP 121/75  Pulse 90  Temp(Src) 97.3 F (36.3 C) (Axillary)  Resp 18  Ht 5\' 5"  (1.651 m)  Wt 114 lb (51.71 kg)  BMI 18.97 kg/m2  SpO2 97%  General appearance: alert, cooperative and appears stated age  Neurologic: intact  Heart: irregularly irregular rhythm and diastolic murmur: holodiastolic 3/6, crescendo at lower left sternal border  Lungs: clear to auscultation bilaterally  Abdomen: soft, non-tender; bowel sounds normal; no masses, no organomegaly  Extremities: extremities normal, atraumatic, no cyanosis or edema, edema left lower leg and cath site rt groin without hematoma  Wound: left inner thigh scar or sarcoma resection and some superior swelling. skin discoloation due to radiation  Diagnostic Studies & Laboratory data:  ECHO:  ------------------------------------------------------------ LV EF: 50% - 55%  ------------------------------------------------------------ Indications: Atrial fibrillation - 427.31.  ------------------------------------------------------------ History: PMH: AI  Dialated Aortic Root Dizziness  Atrial fibrillation.  ------------------------------------------------------------ Study Conclusions  - Left ventricle: The cavity size was moderately dilated. Wall thickness was normal. Systolic function was normal. The estimated ejection fraction was in the range of 50% to 55%. Wall motion was normal; there were no regional wall motion abnormalities. - Aortic valve: Moderate regurgitation. Transthoracic echocardiography. M-mode, complete 2D, spectral Doppler, and color Doppler. Height: Height: 165.1cm. Height: 65in. Weight: Weight: 52.1kg. Weight: 114.6lb. Body mass index: BMI: 19.1kg/m^2. Body surface area: BSA: 1.9m^2. Blood pressure: 94/53. Patient status: Inpatient. Location: Bedside.  ------------------------------------------------------------  ------------------------------------------------------------ Left ventricle: The  cavity size was moderately dilated. Wall thickness was normal. Systolic function was normal. The estimated ejection fraction was in the range of 50% to 55%. Wall motion was normal; there were no regional wall motion abnormalities.  ------------------------------------------------------------ Aortic valve: Doppler: Moderate regurgitation.  ------------------------------------------------------------ Aorta: There was severe dilation of the sinus(es) of Valsalva.  ------------------------------------------------------------ Mitral valve: Structurally normal valve. Doppler: Trivial regurgitation.  ------------------------------------------------------------ Left atrium: The atrium was normal in size.  ------------------------------------------------------------ Right ventricle: The cavity size was normal. Systolic function was normal.  ------------------------------------------------------------ Pulmonic valve: Doppler: Trivial regurgitation.  ------------------------------------------------------------ Tricuspid valve: The valve appears to be grossly normal. Doppler: Trivial regurgitation.  ------------------------------------------------------------ Right atrium: The atrium was normal in size.  ------------------------------------------------------------ Pericardium: There was no pericardial effusion.  ------------------------------------------------------------  2D measurements Normal Doppler measurements Normal Left ventricle Left ventricle LVID ED, 52.4 mm 43-52 Ea, med ann, 5.04 cm/s ------ chord, tiss DP PLAX Aortic valve LVID ES, 34.1 mm 23-38 Regurg PHT 337 ms ------ chord, Tricuspid valve PLAX Regurg peak 225 cm/s ------ FS, chord, 35 % >29 vel PLAX Peak RV-RA 20 mm ------ LVPW, ED 9.94 mm ------ gradient, S Hg IVS/LVPW 1.26 <1.3 Right ventricle ratio, ED Sa vel, lat 10.2 cm/s ------ Ventricular septum ann, tiss DP IVS, ED 12.5 mm ------ LVOT Diam, S 20 mm  ------ Area 3.14 cm^2 ------ Aorta Root diam, 62 mm ------ ED Left atrium AP dim 33 mm ------ AP dim 2.14 cm/m^2 <2.2 index  ------------------------------------------------------------ Prepared and Electronically Authenticated by  Kristeen Miss 2014-03-15T12:15:26.377  CATH:  Procedural Findings:  Hemodynamics  RA 5  RV 25/5  PA 26/14  PCWP 15  LV 130/13  AO 129/65 (89)f  Oxygen saturations:  SVC 55%  PA 63%  AO 96%  Cardiac Output (Fick) 2.87 L/min/m2  Cardiac Index (Fick) 1.81 L/min/m2  Cardiac Output (Thermo) 2.92 L/min  Cardiac Index (Thermo) 1.84 L/min/m2  Coronary angiography:  Coronary dominance: right  Left mainstem: Short and without obstruction  Left anterior descending (LAD): Moderate calcification with about 30% luminal irregularity proximally. No critical obstruction. The vessel terminates as an apical LAD and large distal diagonal.  Left circumflex (LCx): Consists mainly of a large OM branch. There is about 40% segmental plaquing just beyond the takeoff of the small AV circumflex.  Right coronary artery (RCA): Large caliber vessel with 30% proximal narrowing that is eccentric. There is a large caliber PDA and smaller PDA, both free of major disease.  Left ventriculography: There was ectopy with LV angiography, so assessment of LV function is not reliable. EF estimate would be 50%, but unreliable.  Aortography reveals a very large aortic root down involving the sinus of valsalva. There is mild calcification of the valve. There is moderately severe aortic regurgitation (  3 plus) noted.  Final Conclusions:  1. Aortic regurgitation, moderately severe  2. Dilated LV with likely reduction in LV function  3. Recent onset of atrial fibrillation  4. Moderate coronary calcification, without critical obstruction.  Recommendations:  1. Defer surgical decisions to patient with Dr Patty Sermons.  2. Rate control.  3. Given AI and wide pulse pressure, and very small size  will defer reanticoagulation to am. Would consider low dose heparin for 1-2 days before consideration a NOAC given AI and risk of bleeding.  Shawnie Pons MD, [facility], FSCAI  02/02/2013, 7:00 PM  Recent Radiology Findings:  Dg Chest 2 View  01/30/2013 *RADIOLOGY REPORT* Clinical Data: Shortness of breath with deep breath. History of atrial fibrillation and aortic insufficiency. CHEST - 2 VIEW Comparison: 10/29/2012 Findings: Borderline heart size with normal pulmonary vascularity. Emphysematous changes in the lungs. Slight interstitial fibrosis diffusely with mild focal scarring in the left lung base. This appears stable since previous study. No focal consolidation or airspace disease in the lungs. No blunting of costophrenic angles. No pneumothorax. Mediastinal contours appear intact. Degenerative changes in the thoracic spine. No significant change since previous study. IMPRESSION: Emphysematous changes and scarring in the lungs. No evidence of active pulmonary disease. Original Report Authenticated By: Burman Nieves, M.D.  MR THIGH/FEMUR LEFT WWO CONTRAST (TUMOR) (11/18/2012 8:52 AM EST)  Impressions   Likely evolving seroma/hematoma at the surgical site in the proximal medial left thigh which is unchanged in size in comparison to recent study from 10/07/2012 but larger in comparison to prior study from 01/08/2012.  Tumor recurrence cannot be completely excluded.  This could be further evaluated with percutaneous biopsy and/or followup MRI in 3 to 6 months.  Findings discussed with Dr. Jana Half by Dr. Revonda Standard at 10:00 a.m. on 11/18/2012.    Narrative   MR LEFT THIGH/FEMUR WITH AND WITHOUT CONTRAST (TUMOR PROTOCOL), Nov 18, 2012 08:52:00 AM  .  INDICATION: eval for tumor recurrence171.9 Myxoid liposarcoma  COMPARISON: Multiple prior most recently from 10/07/2012, and 01/08/2012.  Marland Kitchen  TECHNIQUE: Multiplanar, multisequence MR images of the left thigh/femur were obtained before and after intravenous  administration of gadolinium-based contrast.  .  FINDINGS:  Postsurgical changes of tumor resection within the medial aspect of the upper thigh with associated architectural distortion of the soft tissues.  There is a 3.4 x 2.9 x 5.8 cm postoperative collection within the resection bed (series 10 image 33 series 7 image 13 ).  It measured 2.4 x 2.1 x 6.5 cm on prior examination from 12/29/2011 but is unchanged from 10/07/2012.  There is a dark peripheral rim (low on T1 and T2) suggesting hemosiderin.  There is an internal high T1 signal component which may related to blood products/hemorrhage (series 16, image 33).  There is no surrounding abnormal increased T2 signal. No visualized lymphadenopathy. No nodular or masslike enhancement. No new lesion.  Similar feathery increased T2 signal surrounding the femoral vessels and within the remaining adductors likely representing posttreatment changes.  Patchy marrow signal within the femoral diaphysis like related to post radiation changes.  Superior and anterosuperior chondrolabral separation and degenerative changes of the labrum.  Mild degenerative changes of the pubic symphysis and left sacroiliac joint.  Colonic diverticulosis without MR evidence of diverticulitis.  Please note the study is not tailored to the intrinsic structures of the knee or hip.   CT of Chest abdomen:  Result Narrative  CT INFUSED CHEST, ABDOMEN AND PELVIS, Oct 07, 2012 11:38:00 AM . INDICATION: eval for chest mets171.9  Myxoid liposarcoma  COMPARISON: Most recent CT CAP 01/08/2012. Marland Kitchen TECHNIQUE: After administration of oral contrast and water, 125 cc of IV contrast material were infused, and dynamic thin-section images were obtained of the chest, abdomen, and pelvis in the portal venous phase at approximately 60 sec.  . ADDITIONAL TECHNIQUE: None . CHEST: . Chest wall/thoracic inlet: Within normal limits. . Thyroid: Within normal limits. . Mediastinum/hila: No mediastinal  or hilar adenopathy. Patulous esophagus. Marland Kitchen Heart/vessels: Mild cardiomegaly. Trace pericardial effusion . Aneurysmal dilation of the aortic root and ascending aorta measuring 4.7 cm maximally, similar to recent imaging from February 2013. Atherosclerotic calcifications of the aorta and coronary arteries. . Lungs: Biapical pleural parenchymal scarring as well as scarring within the lung bases and lingula. No suspicious appearing pulmonary nodules. Stable appearance of small pulmonary nodules dating back over 2 years, likely reflecting benign disease. No consolidative changes. . Pleura: Within normal limits. . ABDOMEN: . Liver: Similar appearance of the liver with scattered peripherally enhancing lesions, most consistent with hemangiomas. There are additional too small to characterize low attenuating lesions again noted. . Gallbladder/Biliary: Within normal limits. . Spleen: Within normal limits. . Pancreas: Within normal limits. . Adrenals: Within normal limits. . Kidneys: Scattered too small to characterize low attenuating lesions the kidneys. . Peritoneum/Mesenteries: Within normal limits. . Extraperitoneum: Within normal limits. . Gastrointestinal tract: Colonic diverticulosis. No bowel obstruction. Normal appendix. . Vascular: Aortobiiliac atherosclerotic calcifications without aneurysm. Marland Kitchen PELVIS: . Peritoneum: Within normal limits. . Extraperitoneum: Within normal limits. . Ureters: Within normal limits. . Bladder: Within normal limits. . Reproductive System: Hysterectomy. . Vascular: Prominent venous collaterals in the pelvis, as can be seen with pelvic congestion syndrome. Ectasia of the left common iliac artery just proximal to its bifurcation measuring 18 mm, similar to prior (image 177). . MSK: No acute osseous abnormality. Degenerative changes of the spine. Osteopenia. . ADDITIONAL COMMENTS: None. .  Recent Lab Findings:  Lab Results   Component  Value  Date    WBC  6.4   02/03/2013    HGB  13.0  02/03/2013    HCT  37.9  02/03/2013    PLT  144*  02/03/2013    GLUCOSE  101*  02/02/2013    ALT  60*  01/30/2013    AST  35  01/30/2013    NA  141  02/02/2013    K  3.7  02/02/2013    CL  107  02/02/2013    CREATININE  0.61  02/02/2013    BUN  16  02/02/2013    CO2  25  02/02/2013    TSH  2.187  01/30/2013    INR  1.00  01/30/2013   Assessment / Plan:    Dilated aortic root 6.2 cm With mod/severe AI and Short left main without coronary occlusive disease  Aortic Size Index=    6.2     /Body surface area is 1.57 meters squared. =3.9  < 2.75 cm/m2      4% risk per year 2.75 to 4.25          8% risk per year > 4.25 cm/m2    20% risk per year    New onset Afib  History of Liposarcoma low grade respected and treated with preop radiation  Stable pulmonary nodules by CT scan done at [facility]  I have discussed the risks and options of aortic root descending aorta and aortic valve replacement with the patient and her husband. I discussed discussed with the patient  with the current size of her aorta that the risk of an acute event may be 20% per year, independent of the moderate to severe aortic insufficiency. On the patient's last visit she had declined surgical intervention, she now returns to further discuss surgical options. Since I last saw her she has now decided that she is willing to proceed with replacement of aortic root and aortic valve we'll consider possible maze.  The goals risks and alternatives of the planned surgical procedure aortic root replacement and aortic valve replacement possible maze  have been discussed with the patient in detail. The risks of the procedure including death, infection, stroke, myocardial infarction, bleeding, blood transfusion have all been discussed specifically.  I have quoted Jake Michaelis a 8% % of perioperative mortality and a complication rate as high as 25% %. The patient's questions have been answered.AMEENA VESEY is  willing  to proceed with the planned procedure. Tentatively plan to proceed on Tuesday may the 13th. She will stop her anticoagulation 5 days preoperatively and her ACE inhibitor 48 hours preoperatively.   Delight Ovens MD  Beeper [phone] Office [phone] 03/19/2013 3:57 PM

## 2013-03-23 ENCOUNTER — Other Ambulatory Visit: Payer: Self-pay | Admitting: *Deleted

## 2013-03-23 DIAGNOSIS — F419 Anxiety disorder, unspecified: Secondary | ICD-10-CM

## 2013-03-23 DIAGNOSIS — G4700 Insomnia, unspecified: Secondary | ICD-10-CM

## 2013-03-23 MED ORDER — ALPRAZOLAM 0.5 MG PO TABS: ORAL_TABLET | ORAL | Status: DC

## 2013-03-25 ENCOUNTER — Encounter (HOSPITAL_COMMUNITY): Payer: Self-pay | Admitting: Pharmacy Technician

## 2013-03-26 ENCOUNTER — Encounter (HOSPITAL_COMMUNITY)
Admission: RE | Admit: 2013-03-26 | Discharge: 2013-03-26 | Disposition: A | Discharge status: Home / Self Care | Payer: Medicare Other | Source: Ambulatory Visit | Attending: Cardiothoracic Surgery | Admitting: Cardiothoracic Surgery

## 2013-03-26 ENCOUNTER — Ambulatory Visit (HOSPITAL_COMMUNITY)
Admission: RE | Admit: 2013-03-26 | Discharge: 2013-03-26 | Disposition: A | Discharge status: Home / Self Care | Payer: Medicare Other | Source: Ambulatory Visit | Attending: Cardiothoracic Surgery | Admitting: Cardiothoracic Surgery

## 2013-03-26 ENCOUNTER — Encounter (HOSPITAL_COMMUNITY): Payer: Self-pay

## 2013-03-26 VITALS — BP 124/65 | HR 47 | Temp 98.0°F | Resp 20 | Ht 63.0 in | Wt 119.0 lb

## 2013-03-26 DIAGNOSIS — Z01812 Encounter for preprocedural laboratory examination: Secondary | ICD-10-CM | POA: Insufficient documentation

## 2013-03-26 DIAGNOSIS — I77810 Thoracic aortic ectasia: Secondary | ICD-10-CM

## 2013-03-26 DIAGNOSIS — I509 Heart failure, unspecified: Secondary | ICD-10-CM | POA: Insufficient documentation

## 2013-03-26 DIAGNOSIS — Z01810 Encounter for preprocedural cardiovascular examination: Secondary | ICD-10-CM

## 2013-03-26 DIAGNOSIS — Z01818 Encounter for other preprocedural examination: Secondary | ICD-10-CM | POA: Insufficient documentation

## 2013-03-26 DIAGNOSIS — I4891 Unspecified atrial fibrillation: Secondary | ICD-10-CM | POA: Insufficient documentation

## 2013-03-26 DIAGNOSIS — I351 Nonrheumatic aortic (valve) insufficiency: Secondary | ICD-10-CM

## 2013-03-26 DIAGNOSIS — I359 Nonrheumatic aortic valve disorder, unspecified: Secondary | ICD-10-CM | POA: Insufficient documentation

## 2013-03-26 LAB — PROTIME-INR
INR: 1 (ref 0.00–1.49)
Prothrombin Time: 13.1 seconds (ref 11.6–15.2)

## 2013-03-26 LAB — CBC
HCT: 37.6 % (ref 36.0–46.0)
Hemoglobin: 13.2 g/dL (ref 12.0–15.0)
MCH: 31.9 pg (ref 26.0–34.0)
MCHC: 35.1 g/dL (ref 30.0–36.0)
MCV: 90.8 fL (ref 78.0–100.0)
Platelets: 153 10*3/uL (ref 150–400)
RBC: 4.14 MIL/uL (ref 3.87–5.11)
RDW: 13.8 % (ref 11.5–15.5)
WBC: 6.4 10*3/uL (ref 4.0–10.5)

## 2013-03-26 LAB — BLOOD GAS, ARTERIAL
Acid-Base Excess: 2.2 mmol/L — ABNORMAL HIGH (ref 0.0–2.0)
Bicarbonate: 26.1 mEq/L — ABNORMAL HIGH (ref 20.0–24.0)
Drawn by: 344381
FIO2: 0.21 %
O2 Saturation: 98.5 %
Patient temperature: 98.6
TCO2: 27.3 mmol/L (ref 0–100)
pCO2 arterial: 39.1 mmHg (ref 35.0–45.0)
pH, Arterial: 7.439 (ref 7.350–7.450)
pO2, Arterial: 79.9 mmHg — ABNORMAL LOW (ref 80.0–100.0)

## 2013-03-26 LAB — COMPREHENSIVE METABOLIC PANEL
ALT: 13 U/L (ref 0–35)
AST: 18 U/L (ref 0–37)
Albumin: 3.4 g/dL — ABNORMAL LOW (ref 3.5–5.2)
Alkaline Phosphatase: 78 U/L (ref 39–117)
BUN: 15 mg/dL (ref 6–23)
CO2: 22 mEq/L (ref 19–32)
Calcium: 9.4 mg/dL (ref 8.4–10.5)
Chloride: 103 mEq/L (ref 96–112)
Creatinine, Ser: 0.62 mg/dL (ref 0.50–1.10)
GFR calc Af Amer: >90 mL/min (ref >90)
GFR calc non Af Amer: 81 mL/min — ABNORMAL LOW (ref >90)
Glucose, Bld: 95 mg/dL (ref 70–99)
Potassium: 4.2 mEq/L (ref 3.5–5.1)
Sodium: 137 mEq/L (ref 135–145)
Total Bilirubin: 0.2 mg/dL — ABNORMAL LOW (ref 0.3–1.2)
Total Protein: 6.5 g/dL (ref 6.0–8.3)

## 2013-03-26 LAB — URINALYSIS, ROUTINE W REFLEX MICROSCOPIC
Bilirubin Urine: NEGATIVE
Glucose, UA: NEGATIVE mg/dL
Ketones, ur: NEGATIVE mg/dL
Nitrite: POSITIVE — AB
Protein, ur: NEGATIVE mg/dL
Specific Gravity, Urine: 1.019 (ref 1.005–1.030)
Urobilinogen, UA: 0.2 mg/dL (ref 0.0–1.0)
pH: 6 (ref 5.0–8.0)

## 2013-03-26 LAB — PULMONARY FUNCTION TEST

## 2013-03-26 LAB — HEMOGLOBIN A1C
Hgb A1c MFr Bld: 5.1 % (ref <5.7)
Mean Plasma Glucose: 100 mg/dL (ref <117)

## 2013-03-26 LAB — URINE MICROSCOPIC-ADD ON

## 2013-03-26 LAB — ABO/RH: ABO/RH(D): O POS

## 2013-03-26 LAB — SURGICAL PCR SCREEN: MRSA, PCR: NEGATIVE

## 2013-03-26 LAB — APTT: aPTT: 30 seconds (ref 24–37)

## 2013-03-26 MED ORDER — ALBUTEROL SULFATE (5 MG/ML) 0.5% IN NEBU
2.5000 mg | INHALATION_SOLUTION | Freq: Once | RESPIRATORY_TRACT | Status: AC
  Administered 2013-03-26: 2.5 mg via RESPIRATORY_TRACT
  Filled 2013-03-26: qty 0.5

## 2013-03-26 NOTE — Pre-Procedure Instructions (Signed)
MIRRA BASILIO  03/26/2013   Your procedure is scheduled on:  Tuesday, May 13th  Report to [facility] at 0530 AM.  Call this number if you have problems the morning of surgery: [phone]   Remember:   Do not eat food or drink liquids after midnight.    Take these medicines the morning of surgery with A SIP OF WATER: Cardizem, Synthroid,  Lopressor   Stop taking xarelto, Fish oil 5 days prior to surgery   Do not wear jewelry, make-up or nail polish.  Do not wear lotions, powders, or perfumes,deodorant.  Do not shave 48 hours prior to surgery.  Do not bring valuables to the hospital.  Contacts, dentures or bridgework may not be worn into surgery.  Leave suitcase in the car. After surgery it may be brought to your room.  For patients admitted to the hospital, checkout time is 11:00 AM the day of discharge.   Patients discharged the day of surgery will not be allowed to drive home.    Special Instructions: Shower using CHG 2 nights before surgery and the night before surgery.  If you shower the day of surgery use CHG.  Use special wash - you have one bottle of CHG for all showers.  You should use approximately 1/3 of the bottle for each shower.   Please read over the following fact sheets that you were given: Pain Booklet, Coughing and Deep Breathing, Blood Transfusion Information, MRSA Information and Surgical Site Infection Prevention

## 2013-03-26 NOTE — Progress Notes (Signed)
VASCULAR LAB PRELIMINARY  PRELIMINARY  PRELIMINARY  PRELIMINARY  Pre-op Cardiac Surgery  Carotid Findings:  Right:  40-59% ICA stenosis.  Left:  No evidence of significant ICA stenosis.  Bilateral:  Vertebral artery flow is antegrade.    Upper Extremity Right Left  Brachial Pressures 141 T 141 T  Radial Waveforms T T  Ulnar Waveforms T T  Palmar Arch (Allen's Test) WNL WNL   Findings:  Doppler waveforms remain normal with ulnar and radial compressions bilaterally.    Kayla Chambers, RVT 03/26/2013, 12:08 PM

## 2013-03-26 NOTE — Progress Notes (Signed)
Primary Physician - Dr. Evert Kohl Cardiologist - Dr. Alinda Money, cath march 2014, ekg 03/10/13 all in epic

## 2013-03-27 ENCOUNTER — Other Ambulatory Visit: Payer: Self-pay | Admitting: *Deleted

## 2013-03-27 DIAGNOSIS — N390 Urinary tract infection, site not specified: Secondary | ICD-10-CM

## 2013-03-27 MED ORDER — CIPROFLOXACIN HCL 500 MG PO TABS: 500.0000 mg | ORAL_TABLET | Freq: Two times a day (BID) | ORAL | Status: DC

## 2013-03-29 LAB — URINE CULTURE: Colony Count: >=100000

## 2013-03-30 ENCOUNTER — Encounter (HOSPITAL_COMMUNITY): Payer: Self-pay | Admitting: Anesthesiology

## 2013-03-30 MED ORDER — PLASMA-LYTE 148 IV SOLN
INTRAVENOUS | Status: DC
  Filled 2013-03-30: qty 2.5

## 2013-03-30 MED ORDER — DEXMEDETOMIDINE HCL IN NACL 400 MCG/100ML IV SOLN
0.1000 ug/kg/h | INTRAVENOUS | Status: AC
  Administered 2013-03-31: 0.2 ug/kg/h via INTRAVENOUS
  Filled 2013-03-30: qty 100

## 2013-03-30 MED ORDER — EPINEPHRINE HCL 1 MG/ML IJ SOLN
0.5000 ug/min | INTRAMUSCULAR | Status: DC
  Filled 2013-03-30: qty 4

## 2013-03-30 MED ORDER — POTASSIUM CHLORIDE 2 MEQ/ML IV SOLN
80.0000 meq | INTRAVENOUS | Status: DC
  Filled 2013-03-30: qty 40

## 2013-03-30 MED ORDER — DEXTROSE 5 % IV SOLN
750.0000 mg | INTRAVENOUS | Status: DC
  Filled 2013-03-30: qty 750

## 2013-03-30 MED ORDER — MAGNESIUM SULFATE 50 % IJ SOLN
40.0000 meq | INTRAMUSCULAR | Status: DC
  Filled 2013-03-30: qty 10

## 2013-03-30 MED ORDER — DEXTROSE 5 % IV SOLN
30.0000 ug/min | INTRAVENOUS | Status: AC
  Administered 2013-03-31: 10 ug/min via INTRAVENOUS
  Filled 2013-03-30: qty 2

## 2013-03-30 MED ORDER — NITROGLYCERIN IN D5W 200-5 MCG/ML-% IV SOLN
2.0000 ug/min | INTRAVENOUS | Status: DC
  Administered 2013-03-31: 5 ug/min via INTRAVENOUS
  Filled 2013-03-30: qty 250

## 2013-03-30 MED ORDER — SODIUM CHLORIDE 0.9 % IV SOLN
INTRAVENOUS | Status: AC
  Administered 2013-03-31: 1 [IU]/h via INTRAVENOUS
  Filled 2013-03-30: qty 1

## 2013-03-30 MED ORDER — SODIUM CHLORIDE 0.9 % IV SOLN
INTRAVENOUS | Status: DC
  Filled 2013-03-30: qty 30

## 2013-03-30 MED ORDER — VANCOMYCIN HCL 10 G IV SOLR
1250.0000 mg | INTRAVENOUS | Status: AC
  Administered 2013-03-31: 1250 mg via INTRAVENOUS
  Filled 2013-03-30 (×2): qty 1250

## 2013-03-30 MED ORDER — DOPAMINE-DEXTROSE 3.2-5 MG/ML-% IV SOLN
2.0000 ug/kg/min | INTRAVENOUS | Status: AC
  Administered 2013-03-31: 3 ug/kg/min via INTRAVENOUS
  Filled 2013-03-30: qty 250

## 2013-03-30 MED ORDER — DEXTROSE 5 % IV SOLN
1.5000 g | INTRAVENOUS | Status: AC
  Administered 2013-03-31: .75 g via INTRAVENOUS
  Administered 2013-03-31: 1.5 g via INTRAVENOUS
  Administered 2013-03-31: .75 g via INTRAVENOUS
  Filled 2013-03-30 (×2): qty 1.5

## 2013-03-30 MED ORDER — METOPROLOL TARTRATE 12.5 MG HALF TABLET: 12.5000 mg | ORAL_TABLET | Freq: Once | ORAL | Status: DC

## 2013-03-30 MED ORDER — SODIUM CHLORIDE 0.9 % IV SOLN
INTRAVENOUS | Status: AC
  Administered 2013-03-31: 70 mL/h via INTRAVENOUS
  Filled 2013-03-30: qty 40

## 2013-03-31 ENCOUNTER — Inpatient Hospital Stay (HOSPITAL_COMMUNITY)
Admission: RE | Admit: 2013-03-31 | Discharge: 2013-04-10 | DRG: 219 | Disposition: A | Discharge status: Home / Self Care | Payer: Medicare Other | Source: Ambulatory Visit | Attending: Cardiothoracic Surgery | Admitting: Cardiothoracic Surgery

## 2013-03-31 ENCOUNTER — Encounter (HOSPITAL_COMMUNITY): Payer: Self-pay | Admitting: *Deleted

## 2013-03-31 ENCOUNTER — Inpatient Hospital Stay (HOSPITAL_COMMUNITY): Payer: Medicare Other

## 2013-03-31 ENCOUNTER — Inpatient Hospital Stay (HOSPITAL_COMMUNITY): Payer: Medicare Other | Admitting: *Deleted

## 2013-03-31 ENCOUNTER — Encounter (HOSPITAL_COMMUNITY)
Admission: RE | Disposition: A | Discharge status: Home / Self Care | Payer: Self-pay | Source: Ambulatory Visit | Attending: Cardiothoracic Surgery

## 2013-03-31 DIAGNOSIS — I480 Paroxysmal atrial fibrillation: Secondary | ICD-10-CM

## 2013-03-31 DIAGNOSIS — I712 Thoracic aortic aneurysm, without rupture, unspecified: Principal | ICD-10-CM | POA: Diagnosis present

## 2013-03-31 DIAGNOSIS — I351 Nonrheumatic aortic (valve) insufficiency: Secondary | ICD-10-CM | POA: Diagnosis present

## 2013-03-31 DIAGNOSIS — Z7982 Long term (current) use of aspirin: Secondary | ICD-10-CM

## 2013-03-31 DIAGNOSIS — M353 Polymyalgia rheumatica: Secondary | ICD-10-CM | POA: Diagnosis present

## 2013-03-31 DIAGNOSIS — E039 Hypothyroidism, unspecified: Secondary | ICD-10-CM | POA: Diagnosis present

## 2013-03-31 DIAGNOSIS — I5032 Chronic diastolic (congestive) heart failure: Secondary | ICD-10-CM

## 2013-03-31 DIAGNOSIS — I359 Nonrheumatic aortic valve disorder, unspecified: Secondary | ICD-10-CM | POA: Diagnosis present

## 2013-03-31 DIAGNOSIS — Z9889 Other specified postprocedural states: Secondary | ICD-10-CM

## 2013-03-31 DIAGNOSIS — D696 Thrombocytopenia, unspecified: Secondary | ICD-10-CM | POA: Diagnosis present

## 2013-03-31 DIAGNOSIS — I77810 Thoracic aortic ectasia: Secondary | ICD-10-CM

## 2013-03-31 DIAGNOSIS — I10 Essential (primary) hypertension: Secondary | ICD-10-CM

## 2013-03-31 DIAGNOSIS — E876 Hypokalemia: Secondary | ICD-10-CM | POA: Diagnosis not present

## 2013-03-31 DIAGNOSIS — I4891 Unspecified atrial fibrillation: Secondary | ICD-10-CM | POA: Diagnosis present

## 2013-03-31 DIAGNOSIS — I4901 Ventricular fibrillation: Secondary | ICD-10-CM | POA: Diagnosis not present

## 2013-03-31 DIAGNOSIS — I2510 Atherosclerotic heart disease of native coronary artery without angina pectoris: Secondary | ICD-10-CM

## 2013-03-31 DIAGNOSIS — Z79899 Other long term (current) drug therapy: Secondary | ICD-10-CM

## 2013-03-31 DIAGNOSIS — R1310 Dysphagia, unspecified: Secondary | ICD-10-CM | POA: Diagnosis present

## 2013-03-31 DIAGNOSIS — D62 Acute posthemorrhagic anemia: Secondary | ICD-10-CM | POA: Diagnosis not present

## 2013-03-31 DIAGNOSIS — I5033 Acute on chronic diastolic (congestive) heart failure: Secondary | ICD-10-CM

## 2013-03-31 DIAGNOSIS — C499 Malignant neoplasm of connective and soft tissue, unspecified: Secondary | ICD-10-CM

## 2013-03-31 DIAGNOSIS — IMO0001 Reserved for inherently not codable concepts without codable children: Secondary | ICD-10-CM | POA: Diagnosis present

## 2013-03-31 DIAGNOSIS — R42 Dizziness and giddiness: Secondary | ICD-10-CM

## 2013-03-31 DIAGNOSIS — R911 Solitary pulmonary nodule: Secondary | ICD-10-CM

## 2013-03-31 DIAGNOSIS — G629 Polyneuropathy, unspecified: Secondary | ICD-10-CM

## 2013-03-31 DIAGNOSIS — I252 Old myocardial infarction: Secondary | ICD-10-CM

## 2013-03-31 DIAGNOSIS — K769 Liver disease, unspecified: Secondary | ICD-10-CM

## 2013-03-31 HISTORY — DX: Paroxysmal atrial fibrillation: I48.0

## 2013-03-31 HISTORY — PX: INTRAOPERATIVE TRANSESOPHAGEAL ECHOCARDIOGRAM: SHX5062

## 2013-03-31 HISTORY — DX: Chronic diastolic (congestive) heart failure: I50.32

## 2013-03-31 HISTORY — PX: MAZE: SHX5063

## 2013-03-31 HISTORY — PX: ASCENDING AORTIC ROOT REPLACEMENT: SHX5729

## 2013-03-31 LAB — POCT I-STAT 3, ART BLOOD GAS (G3+)
Acid-Base Excess: 2 mmol/L (ref 0.0–2.0)
Acid-Base Excess: 3 mmol/L — ABNORMAL HIGH (ref 0.0–2.0)
Acid-base deficit: 1 mmol/L (ref 0.0–2.0)
Acid-base deficit: 1 mmol/L (ref 0.0–2.0)
Acid-base deficit: 2 mmol/L (ref 0.0–2.0)
Acid-base deficit: 2 mmol/L (ref 0.0–2.0)
Acid-base deficit: 3 mmol/L — ABNORMAL HIGH (ref 0.0–2.0)
Acid-base deficit: 1 mmol/L (ref 0.0–2.0)
Bicarbonate: 27.1 mEq/L — ABNORMAL HIGH (ref 20.0–24.0)
Bicarbonate: 27 mEq/L — ABNORMAL HIGH (ref 20.0–24.0)
Bicarbonate: 23.3 mEq/L (ref 20.0–24.0)
Bicarbonate: 23.8 mEq/L (ref 20.0–24.0)
Bicarbonate: 23.9 mEq/L (ref 20.0–24.0)
Bicarbonate: 22.9 mEq/L (ref 20.0–24.0)
Bicarbonate: 21.5 mEq/L (ref 20.0–24.0)
Bicarbonate: 23.2 mEq/L (ref 20.0–24.0)
O2 Saturation: 100 %
O2 Saturation: 100 %
O2 Saturation: 100 %
O2 Saturation: 100 %
O2 Saturation: 100 %
O2 Saturation: 100 %
O2 Saturation: 100 %
O2 Saturation: 100 %
TCO2: 28 mmol/L (ref 0–100)
TCO2: 28 mmol/L (ref 0–100)
TCO2: 24 mmol/L (ref 0–100)
TCO2: 25 mmol/L (ref 0–100)
TCO2: 25 mmol/L (ref 0–100)
TCO2: 24 mmol/L (ref 0–100)
TCO2: 23 mmol/L (ref 0–100)
TCO2: 24 mmol/L (ref 0–100)
pCO2 arterial: 44.9 mmHg (ref 35.0–45.0)
pCO2 arterial: 40.2 mmHg (ref 35.0–45.0)
pCO2 arterial: 35.4 mmHg (ref 35.0–45.0)
pCO2 arterial: 40.4 mmHg (ref 35.0–45.0)
pCO2 arterial: 44.1 mmHg (ref 35.0–45.0)
pCO2 arterial: 38 mmHg (ref 35.0–45.0)
pCO2 arterial: 33.7 mmHg — ABNORMAL LOW (ref 35.0–45.0)
pCO2 arterial: 35.5 mmHg (ref 35.0–45.0)
pH, Arterial: 7.389 (ref 7.350–7.450)
pH, Arterial: 7.435 (ref 7.350–7.450)
pH, Arterial: 7.427 (ref 7.350–7.450)
pH, Arterial: 7.342 — ABNORMAL LOW (ref 7.350–7.450)
pH, Arterial: 7.378 (ref 7.350–7.450)
pH, Arterial: 7.388 (ref 7.350–7.450)
pH, Arterial: 7.414 (ref 7.350–7.450)
pH, Arterial: 7.424 (ref 7.350–7.450)
pO2, Arterial: 468 mmHg — ABNORMAL HIGH (ref 80.0–100.0)
pO2, Arterial: 396 mmHg — ABNORMAL HIGH (ref 80.0–100.0)
pO2, Arterial: 377 mmHg — ABNORMAL HIGH (ref 80.0–100.0)
pO2, Arterial: 283 mmHg — ABNORMAL HIGH (ref 80.0–100.0)
pO2, Arterial: 316 mmHg — ABNORMAL HIGH (ref 80.0–100.0)
pO2, Arterial: 376 mmHg — ABNORMAL HIGH (ref 80.0–100.0)
pO2, Arterial: 329 mmHg — ABNORMAL HIGH (ref 80.0–100.0)
pO2, Arterial: 182 mmHg — ABNORMAL HIGH (ref 80.0–100.0)

## 2013-03-31 LAB — POCT I-STAT 4, (NA,K, GLUC, HGB,HCT)
Glucose, Bld: 107 mg/dL — ABNORMAL HIGH (ref 70–99)
Glucose, Bld: 88 mg/dL (ref 70–99)
Glucose, Bld: 98 mg/dL (ref 70–99)
Glucose, Bld: 117 mg/dL — ABNORMAL HIGH (ref 70–99)
Glucose, Bld: 131 mg/dL — ABNORMAL HIGH (ref 70–99)
Glucose, Bld: 109 mg/dL — ABNORMAL HIGH (ref 70–99)
Glucose, Bld: 151 mg/dL — ABNORMAL HIGH (ref 70–99)
Glucose, Bld: 158 mg/dL — ABNORMAL HIGH (ref 70–99)
Glucose, Bld: 129 mg/dL — ABNORMAL HIGH (ref 70–99)
Glucose, Bld: 160 mg/dL — ABNORMAL HIGH (ref 70–99)
Glucose, Bld: 131 mg/dL — ABNORMAL HIGH (ref 70–99)
Glucose, Bld: 160 mg/dL — ABNORMAL HIGH (ref 70–99)
HCT: 37 % (ref 36.0–46.0)
HCT: 26 % — ABNORMAL LOW (ref 36.0–46.0)
HCT: 35 % — ABNORMAL LOW (ref 36.0–46.0)
HCT: 32 % — ABNORMAL LOW (ref 36.0–46.0)
HCT: 31 % — ABNORMAL LOW (ref 36.0–46.0)
HCT: 26 % — ABNORMAL LOW (ref 36.0–46.0)
HCT: 31 % — ABNORMAL LOW (ref 36.0–46.0)
HCT: 33 % — ABNORMAL LOW (ref 36.0–46.0)
HCT: 25 % — ABNORMAL LOW (ref 36.0–46.0)
HCT: 34 % — ABNORMAL LOW (ref 36.0–46.0)
HCT: 26 % — ABNORMAL LOW (ref 36.0–46.0)
HCT: 23 % — ABNORMAL LOW (ref 36.0–46.0)
Hemoglobin: 12.6 g/dL (ref 12.0–15.0)
Hemoglobin: 11.9 g/dL — ABNORMAL LOW (ref 12.0–15.0)
Hemoglobin: 8.8 g/dL — ABNORMAL LOW (ref 12.0–15.0)
Hemoglobin: 10.9 g/dL — ABNORMAL LOW (ref 12.0–15.0)
Hemoglobin: 10.5 g/dL — ABNORMAL LOW (ref 12.0–15.0)
Hemoglobin: 8.8 g/dL — ABNORMAL LOW (ref 12.0–15.0)
Hemoglobin: 10.5 g/dL — ABNORMAL LOW (ref 12.0–15.0)
Hemoglobin: 11.2 g/dL — ABNORMAL LOW (ref 12.0–15.0)
Hemoglobin: 11.6 g/dL — ABNORMAL LOW (ref 12.0–15.0)
Hemoglobin: 8.5 g/dL — ABNORMAL LOW (ref 12.0–15.0)
Hemoglobin: 8.8 g/dL — ABNORMAL LOW (ref 12.0–15.0)
Hemoglobin: 7.8 g/dL — ABNORMAL LOW (ref 12.0–15.0)
Potassium: 3.6 mEq/L (ref 3.5–5.1)
Potassium: 3.5 mEq/L (ref 3.5–5.1)
Potassium: 3.6 mEq/L (ref 3.5–5.1)
Potassium: 4.1 mEq/L (ref 3.5–5.1)
Potassium: 4.6 mEq/L (ref 3.5–5.1)
Potassium: 3.3 mEq/L — ABNORMAL LOW (ref 3.5–5.1)
Potassium: 3.2 mEq/L — ABNORMAL LOW (ref 3.5–5.1)
Potassium: 3.9 mEq/L (ref 3.5–5.1)
Potassium: 4 mEq/L (ref 3.5–5.1)
Potassium: 4.1 mEq/L (ref 3.5–5.1)
Potassium: 3.6 mEq/L (ref 3.5–5.1)
Potassium: 3.2 mEq/L — ABNORMAL LOW (ref 3.5–5.1)
Sodium: 141 mEq/L (ref 135–145)
Sodium: 141 mEq/L (ref 135–145)
Sodium: 141 mEq/L (ref 135–145)
Sodium: 141 mEq/L (ref 135–145)
Sodium: 141 mEq/L (ref 135–145)
Sodium: 145 mEq/L (ref 135–145)
Sodium: 145 mEq/L (ref 135–145)
Sodium: 145 mEq/L (ref 135–145)
Sodium: 144 mEq/L (ref 135–145)
Sodium: 145 mEq/L (ref 135–145)
Sodium: 145 mEq/L (ref 135–145)
Sodium: 140 mEq/L (ref 135–145)

## 2013-03-31 LAB — CBC
MCHC: 36.2 g/dL — ABNORMAL HIGH (ref 30.0–36.0)
RDW: 15.2 % (ref 11.5–15.5)
WBC: 9.5 10*3/uL (ref 4.0–10.5)

## 2013-03-31 LAB — HEMOGLOBIN AND HEMATOCRIT, BLOOD
HCT: 22.7 % — ABNORMAL LOW (ref 36.0–46.0)
Hemoglobin: 8 g/dL — ABNORMAL LOW (ref 12.0–15.0)

## 2013-03-31 LAB — GLUCOSE, CAPILLARY
Glucose-Capillary: 109 mg/dL — ABNORMAL HIGH (ref 70–99)
Glucose-Capillary: 152 mg/dL — ABNORMAL HIGH (ref 70–99)
Glucose-Capillary: 152 mg/dL — ABNORMAL HIGH (ref 70–99)

## 2013-03-31 LAB — PROTIME-INR
INR: 1.58 — ABNORMAL HIGH (ref 0.00–1.49)
Prothrombin Time: 18.4 seconds — ABNORMAL HIGH (ref 11.6–15.2)

## 2013-03-31 LAB — APTT: aPTT: 42 seconds — ABNORMAL HIGH (ref 24–37)

## 2013-03-31 LAB — PLATELET COUNT: Platelets: 93 10*3/uL — ABNORMAL LOW (ref 150–400)

## 2013-03-31 LAB — PREPARE RBC (CROSSMATCH)

## 2013-03-31 SURGERY — ASCENDING AORTIC ROOT REPLACEMENT
Anesthesia: General | Site: Chest | Wound class: Clean

## 2013-03-31 MED ORDER — SODIUM CHLORIDE 0.9 % IV SOLN
INTRAVENOUS | Status: DC | PRN
  Administered 2013-03-31 (×2): via INTRAVENOUS

## 2013-03-31 MED ORDER — MILRINONE LOAD VIA INFUSION
INTRAVENOUS | Status: DC | PRN
  Administered 2013-03-31: 2700 ug via INTRAVENOUS
  Administered 2013-03-31 (×3): .3 ug via INTRAVENOUS

## 2013-03-31 MED ORDER — ACETAMINOPHEN 10 MG/ML IV SOLN
1000.0000 mg | Freq: Once | INTRAVENOUS | Status: AC
  Administered 2013-03-31: 1000 mg via INTRAVENOUS

## 2013-03-31 MED ORDER — ASPIRIN 81 MG PO CHEW: 324.0000 mg | CHEWABLE_TABLET | Freq: Every day | ORAL | Status: DC

## 2013-03-31 MED ORDER — LIDOCAINE HCL (CARDIAC) 20 MG/ML IV SOLN
INTRAVENOUS | Status: DC | PRN
  Administered 2013-03-31: 80 mg via INTRAVENOUS

## 2013-03-31 MED ORDER — AMIODARONE HCL IN DEXTROSE 360-4.14 MG/200ML-% IV SOLN
60.0000 mg/h | INTRAVENOUS | Status: DC
  Filled 2013-03-31 (×2): qty 200

## 2013-03-31 MED ORDER — ALBUMIN HUMAN 5 % IV SOLN
250.0000 mL | INTRAVENOUS | Status: AC | PRN
  Administered 2013-03-31 – 2013-04-01 (×2): 250 mL via INTRAVENOUS

## 2013-03-31 MED ORDER — MIDAZOLAM HCL 2 MG/2ML IJ SOLN
2.0000 mg | INTRAMUSCULAR | Status: DC | PRN
  Administered 2013-03-31 – 2013-04-01 (×3): 2 mg via INTRAVENOUS
  Filled 2013-03-31 (×3): qty 2

## 2013-03-31 MED ORDER — MILRINONE IN DEXTROSE 20 MG/100ML IV SOLN
0.3000 ug/kg/min | INTRAVENOUS | Status: DC
  Administered 2013-03-31 – 2013-04-01 (×2): 0.3 ug/kg/min via INTRAVENOUS
  Filled 2013-03-31 (×2): qty 100

## 2013-03-31 MED ORDER — POTASSIUM CHLORIDE 10 MEQ/50ML IV SOLN
10.0000 meq | INTRAVENOUS | Status: AC
  Administered 2013-03-31 (×3): 10 meq via INTRAVENOUS

## 2013-03-31 MED ORDER — FAMOTIDINE IN NACL 20-0.9 MG/50ML-% IV SOLN
20.0000 mg | Freq: Two times a day (BID) | INTRAVENOUS | Status: AC
  Administered 2013-03-31: 20 mg via INTRAVENOUS

## 2013-03-31 MED ORDER — SODIUM CHLORIDE 0.9 % IJ SOLN
OROMUCOSAL | Status: DC | PRN
  Administered 2013-03-31 (×2): via TOPICAL

## 2013-03-31 MED ORDER — SODIUM CHLORIDE 0.9 % IV SOLN
INTRAVENOUS | Status: DC
  Administered 2013-03-31: 20 mL via INTRAVENOUS

## 2013-03-31 MED ORDER — OXYCODONE HCL 5 MG PO TABS
5.0000 mg | ORAL_TABLET | ORAL | Status: DC | PRN
  Administered 2013-04-01 – 2013-04-06 (×8): 5 mg via ORAL
  Administered 2013-04-07 (×2): 10 mg via ORAL
  Administered 2013-04-08 – 2013-04-09 (×5): 5 mg via ORAL
  Filled 2013-03-31 (×12): qty 1
  Filled 2013-03-31: qty 2
  Filled 2013-03-31: qty 1
  Filled 2013-03-31: qty 2

## 2013-03-31 MED ORDER — ACETAMINOPHEN 500 MG PO TABS
1000.0000 mg | ORAL_TABLET | Freq: Four times a day (QID) | ORAL | Status: AC
  Administered 2013-04-02 – 2013-04-05 (×8): 1000 mg via ORAL
  Filled 2013-03-31 (×20): qty 2

## 2013-03-31 MED ORDER — ACETAMINOPHEN 160 MG/5ML PO SOLN: 975.0000 mg | Freq: Four times a day (QID) | ORAL | Status: AC

## 2013-03-31 MED ORDER — LACTATED RINGERS IV SOLN
INTRAVENOUS | Status: DC | PRN
  Administered 2013-03-31 (×3): via INTRAVENOUS

## 2013-03-31 MED ORDER — 0.9 % SODIUM CHLORIDE (POUR BTL) OPTIME
TOPICAL | Status: DC | PRN
  Administered 2013-03-31: 5000 mL

## 2013-03-31 MED ORDER — FENTANYL CITRATE 0.05 MG/ML IJ SOLN
INTRAMUSCULAR | Status: DC | PRN
  Administered 2013-03-31: 150 ug via INTRAVENOUS
  Administered 2013-03-31 (×3): 100 ug via INTRAVENOUS
  Administered 2013-03-31: 50 ug via INTRAVENOUS
  Administered 2013-03-31: 250 ug via INTRAVENOUS
  Administered 2013-03-31: 50 ug via INTRAVENOUS
  Administered 2013-03-31: 150 ug via INTRAVENOUS
  Administered 2013-03-31: 200 ug via INTRAVENOUS

## 2013-03-31 MED ORDER — PLASMA-LYTE 148 IV SOLN
Freq: Once | INTRAVENOUS | Status: AC
Start: 2013-03-31 — End: 2013-03-31
  Administered 2013-03-31: 17:00:00
  Filled 2013-03-31: qty 2.5

## 2013-03-31 MED ORDER — SODIUM CHLORIDE 0.9 % IJ SOLN: 3.0000 mL | INTRAMUSCULAR | Status: DC | PRN

## 2013-03-31 MED ORDER — METOCLOPRAMIDE HCL 5 MG/ML IJ SOLN
10.0000 mg | Freq: Four times a day (QID) | INTRAMUSCULAR | Status: AC
  Administered 2013-03-31 – 2013-04-01 (×4): 10 mg via INTRAVENOUS
  Filled 2013-03-31 (×4): qty 2

## 2013-03-31 MED ORDER — MORPHINE SULFATE 2 MG/ML IJ SOLN
2.0000 mg | INTRAMUSCULAR | Status: DC | PRN
  Administered 2013-03-31 – 2013-04-01 (×2): 2 mg via INTRAVENOUS
  Administered 2013-04-01: 4 mg via INTRAVENOUS
  Administered 2013-04-01 – 2013-04-05 (×7): 2 mg via INTRAVENOUS
  Filled 2013-03-31 (×5): qty 1
  Filled 2013-03-31: qty 2
  Filled 2013-03-31 (×5): qty 1

## 2013-03-31 MED ORDER — RISAQUAD PO CAPS
1.0000 | ORAL_CAPSULE | Freq: Every day | ORAL | Status: DC
  Administered 2013-04-02 – 2013-04-10 (×7): 1 via ORAL
  Filled 2013-03-31 (×11): qty 1

## 2013-03-31 MED ORDER — METOPROLOL TARTRATE 1 MG/ML IV SOLN: 2.5000 mg | INTRAVENOUS | Status: DC | PRN

## 2013-03-31 MED ORDER — METOPROLOL TARTRATE 25 MG/10 ML ORAL SUSPENSION
12.5000 mg | Freq: Two times a day (BID) | ORAL | Status: DC
  Filled 2013-03-31 (×7): qty 5

## 2013-03-31 MED ORDER — ARTIFICIAL TEARS OP OINT
TOPICAL_OINTMENT | OPHTHALMIC | Status: DC | PRN
  Administered 2013-03-31: 1 via OPHTHALMIC

## 2013-03-31 MED ORDER — ONDANSETRON HCL 4 MG/2ML IJ SOLN
4.0000 mg | Freq: Four times a day (QID) | INTRAMUSCULAR | Status: DC | PRN
  Administered 2013-04-02: 4 mg via INTRAVENOUS
  Filled 2013-03-31: qty 2

## 2013-03-31 MED ORDER — SODIUM CHLORIDE 0.9 % IV SOLN: 250.0000 mL | INTRAVENOUS | Status: DC

## 2013-03-31 MED ORDER — PROPOFOL 10 MG/ML IV BOLUS
INTRAVENOUS | Status: DC | PRN
Start: 2013-03-31 — End: 2013-03-31
  Administered 2013-03-31: 50 mg via INTRAVENOUS

## 2013-03-31 MED ORDER — INSULIN REGULAR HUMAN 100 UNIT/ML IJ SOLN
INTRAMUSCULAR | Status: DC
  Administered 2013-03-31: 3 [IU]/h via INTRAVENOUS

## 2013-03-31 MED ORDER — PANTOPRAZOLE SODIUM 40 MG PO TBEC
40.0000 mg | DELAYED_RELEASE_TABLET | Freq: Every day | ORAL | Status: DC
  Administered 2013-04-02: 40 mg via ORAL
  Filled 2013-03-31 (×2): qty 1

## 2013-03-31 MED ORDER — SODIUM CHLORIDE 0.9 % IJ SOLN
OROMUCOSAL | Status: DC | PRN
  Administered 2013-03-31 (×3): via TOPICAL

## 2013-03-31 MED ORDER — CHLORHEXIDINE GLUCONATE 4 % EX LIQD: 30.0000 mL | CUTANEOUS | Status: DC

## 2013-03-31 MED ORDER — HEPARIN SODIUM (PORCINE) 1000 UNIT/ML IJ SOLN
INTRAMUSCULAR | Status: DC | PRN
  Administered 2013-03-31: 13000 [IU] via INTRAVENOUS
  Administered 2013-03-31: 16000 [IU] via INTRAVENOUS

## 2013-03-31 MED ORDER — HEMOSTATIC AGENTS (NO CHARGE) OPTIME
TOPICAL | Status: DC | PRN
  Administered 2013-03-31: 1 via TOPICAL

## 2013-03-31 MED ORDER — POTASSIUM CHLORIDE 10 MEQ/100ML IV SOLN
INTRAVENOUS | Status: DC | PRN
  Administered 2013-03-31 (×2): 10 meq via INTRAVENOUS

## 2013-03-31 MED ORDER — GELATIN ABSORBABLE 12-7 MM EX MISC
CUTANEOUS | Status: DC | PRN
  Administered 2013-03-31: 1

## 2013-03-31 MED ORDER — INSULIN REGULAR BOLUS VIA INFUSION
0.0000 [IU] | Freq: Three times a day (TID) | INTRAVENOUS | Status: DC
  Filled 2013-03-31: qty 10

## 2013-03-31 MED ORDER — BISACODYL 5 MG PO TBEC
10.0000 mg | DELAYED_RELEASE_TABLET | Freq: Every day | ORAL | Status: DC
  Administered 2013-04-02 – 2013-04-07 (×4): 10 mg via ORAL
  Filled 2013-03-31 (×6): qty 2

## 2013-03-31 MED ORDER — SODIUM CHLORIDE 0.9 % IV SOLN
1.0000 g/h | INTRAVENOUS | Status: DC
  Filled 2013-03-31: qty 20

## 2013-03-31 MED ORDER — DEXMEDETOMIDINE HCL IN NACL 200 MCG/50ML IV SOLN
0.1000 ug/kg/h | INTRAVENOUS | Status: DC
  Administered 2013-03-31: 0.7 ug/kg/h via INTRAVENOUS

## 2013-03-31 MED ORDER — MAGNESIUM SULFATE 40 MG/ML IJ SOLN: 4.0000 g | Freq: Once | INTRAMUSCULAR | Status: AC

## 2013-03-31 MED ORDER — MAGNESIUM SULFATE 40 MG/ML IJ SOLN
INTRAMUSCULAR | Status: AC
  Administered 2013-03-31: 4 g
  Filled 2013-03-31: qty 100

## 2013-03-31 MED ORDER — ROCURONIUM BROMIDE 100 MG/10ML IV SOLN
INTRAVENOUS | Status: DC | PRN
  Administered 2013-03-31 (×6): 50 mg via INTRAVENOUS

## 2013-03-31 MED ORDER — DOCUSATE SODIUM 100 MG PO CAPS
200.0000 mg | ORAL_CAPSULE | Freq: Every day | ORAL | Status: DC
  Administered 2013-04-02 – 2013-04-10 (×6): 200 mg via ORAL
  Filled 2013-03-31 (×3): qty 2
  Filled 2013-03-31 (×2): qty 1
  Filled 2013-03-31 (×4): qty 2

## 2013-03-31 MED ORDER — AMINOCAPROIC ACID 250 MG/ML IV SOLN
1.0000 g/h | Freq: Once | INTRAVENOUS | Status: DC
  Filled 2013-03-31: qty 20

## 2013-03-31 MED ORDER — PROTAMINE SULFATE 10 MG/ML IV SOLN
INTRAVENOUS | Status: DC | PRN
  Administered 2013-03-31: 130 mg via INTRAVENOUS
  Administered 2013-03-31: 150 mg via INTRAVENOUS

## 2013-03-31 MED ORDER — DEXMEDETOMIDINE HCL IN NACL 400 MCG/100ML IV SOLN
0.4000 ug/kg/h | INTRAVENOUS | Status: DC
  Filled 2013-03-31 (×2): qty 100

## 2013-03-31 MED ORDER — ALBUMIN HUMAN 5 % IV SOLN
INTRAVENOUS | Status: DC | PRN
  Administered 2013-03-31 (×4): via INTRAVENOUS

## 2013-03-31 MED ORDER — NITROGLYCERIN IN D5W 200-5 MCG/ML-% IV SOLN: 0.0000 ug/min | INTRAVENOUS | Status: DC

## 2013-03-31 MED ORDER — MIDAZOLAM HCL 5 MG/5ML IJ SOLN
INTRAMUSCULAR | Status: DC | PRN
  Administered 2013-03-31 (×2): 2 mg via INTRAVENOUS
  Administered 2013-03-31: 1 mg via INTRAVENOUS
  Administered 2013-03-31: 2 mg via INTRAVENOUS
  Administered 2013-03-31: 3 mg via INTRAVENOUS
  Administered 2013-03-31: 2 mg via INTRAVENOUS

## 2013-03-31 MED ORDER — LEVOTHYROXINE SODIUM 75 MCG PO TABS
75.0000 ug | ORAL_TABLET | Freq: Every day | ORAL | Status: DC
  Administered 2013-04-02 – 2013-04-10 (×7): 75 ug via ORAL
  Filled 2013-03-31 (×10): qty 1

## 2013-03-31 MED ORDER — DEXTROSE 5 % IV SOLN
1.5000 g | Freq: Two times a day (BID) | INTRAVENOUS | Status: AC
  Administered 2013-03-31 – 2013-04-02 (×4): 1.5 g via INTRAVENOUS
  Filled 2013-03-31 (×5): qty 1.5

## 2013-03-31 MED ORDER — AMIODARONE HCL IN DEXTROSE 360-4.14 MG/200ML-% IV SOLN
30.0000 mg/h | INTRAVENOUS | Status: AC
  Administered 2013-03-31 – 2013-04-04 (×8): 30 mg/h via INTRAVENOUS
  Filled 2013-03-31 (×20): qty 200

## 2013-03-31 MED ORDER — DEXTROSE 5 % IV SOLN
750.0000 mg | Freq: Once | INTRAVENOUS | Status: DC
  Filled 2013-03-31: qty 750

## 2013-03-31 MED ORDER — LACTATED RINGERS IV SOLN
INTRAVENOUS | Status: DC | PRN
  Administered 2013-03-31 (×2): via INTRAVENOUS

## 2013-03-31 MED ORDER — BISACODYL 10 MG RE SUPP: 10.0000 mg | Freq: Every day | RECTAL | Status: DC

## 2013-03-31 MED ORDER — LACTATED RINGERS IV SOLN
INTRAVENOUS | Status: DC
  Administered 2013-03-31: 20 mL/h via INTRAVENOUS

## 2013-03-31 MED ORDER — AMIODARONE HCL IN DEXTROSE 360-4.14 MG/200ML-% IV SOLN
INTRAVENOUS | Status: DC | PRN
  Administered 2013-03-31: 60 mg/h via INTRAVENOUS

## 2013-03-31 MED ORDER — SODIUM CHLORIDE 0.9 % IJ SOLN
3.0000 mL | Freq: Two times a day (BID) | INTRAMUSCULAR | Status: DC
  Administered 2013-04-01 – 2013-04-06 (×9): 3 mL via INTRAVENOUS
  Administered 2013-04-07: 30 mL via INTRAVENOUS
  Administered 2013-04-08: 3 mL via INTRAVENOUS

## 2013-03-31 MED ORDER — SODIUM CHLORIDE 0.45 % IV SOLN
INTRAVENOUS | Status: DC
  Administered 2013-03-31: 20 mL/h via INTRAVENOUS
  Administered 2013-04-04: 13:00:00 via INTRAVENOUS

## 2013-03-31 MED ORDER — ADULT MULTIVITAMIN W/MINERALS CH
1.0000 | ORAL_TABLET | Freq: Every day | ORAL | Status: DC
  Administered 2013-04-02 – 2013-04-10 (×7): 1 via ORAL
  Filled 2013-03-31 (×11): qty 1

## 2013-03-31 MED ORDER — PROBIOTIC DAILY PO CAPS: 1.0000 | ORAL_CAPSULE | Freq: Every morning | ORAL | Status: DC

## 2013-03-31 MED ORDER — SODIUM CHLORIDE 0.9 % IV SOLN
INTRAVENOUS | Status: DC | PRN
  Filled 2013-03-31: qty 1

## 2013-03-31 MED ORDER — MILRINONE LOAD VIA INFUSION: INTRAVENOUS | Status: DC | PRN

## 2013-03-31 MED ORDER — LACTATED RINGERS IV SOLN: 500.0000 mL | Freq: Once | INTRAVENOUS | Status: AC | PRN

## 2013-03-31 MED ORDER — VANCOMYCIN HCL IN DEXTROSE 1-5 GM/200ML-% IV SOLN
1000.0000 mg | Freq: Once | INTRAVENOUS | Status: AC
  Administered 2013-03-31: 1000 mg via INTRAVENOUS
  Filled 2013-03-31: qty 200

## 2013-03-31 MED ORDER — PHENYLEPHRINE HCL 10 MG/ML IJ SOLN
0.0000 ug/min | INTRAVENOUS | Status: DC
  Administered 2013-03-31: 30 ug/min via INTRAVENOUS
  Filled 2013-03-31 (×2): qty 2

## 2013-03-31 MED ORDER — LACTATED RINGERS IV SOLN
INTRAVENOUS | Status: DC | PRN
  Administered 2013-03-31: 07:00:00 via INTRAVENOUS

## 2013-03-31 MED ORDER — PHENYLEPHRINE HCL 10 MG/ML IJ SOLN
30.0000 ug/min | INTRAVENOUS | Status: DC
  Filled 2013-03-31: qty 2

## 2013-03-31 MED ORDER — OMEGA-3-ACID ETHYL ESTERS 1 G PO CAPS
1.0000 g | ORAL_CAPSULE | Freq: Every day | ORAL | Status: DC
  Administered 2013-04-02: 1 g via ORAL
  Filled 2013-03-31 (×4): qty 1

## 2013-03-31 MED ORDER — MORPHINE SULFATE 2 MG/ML IJ SOLN: 1.0000 mg | INTRAMUSCULAR | Status: AC | PRN

## 2013-03-31 MED ORDER — OMEGA-3 FATTY ACIDS 1000 MG PO CAPS: 1.0000 g | ORAL_CAPSULE | Freq: Two times a day (BID) | ORAL | Status: DC

## 2013-03-31 MED ORDER — METOPROLOL TARTRATE 12.5 MG HALF TABLET
12.5000 mg | ORAL_TABLET | Freq: Two times a day (BID) | ORAL | Status: DC
  Administered 2013-04-01 – 2013-04-03 (×4): 12.5 mg via ORAL
  Filled 2013-03-31 (×7): qty 1

## 2013-03-31 MED ORDER — ASPIRIN EC 325 MG PO TBEC
325.0000 mg | DELAYED_RELEASE_TABLET | Freq: Every day | ORAL | Status: DC
  Administered 2013-04-02: 325 mg via ORAL
  Filled 2013-03-31 (×5): qty 1

## 2013-03-31 SURGICAL SUPPLY — 153 items
ADAPTER CARDIO PERF ANTE/RETRO (ADAPTER) ×3 IMPLANT
ADH SKN CLS APL DERMABOND .7 (GAUZE/BANDAGES/DRESSINGS) ×2
ADPR PRFSN 84XANTGRD RTRGD (ADAPTER) ×2
AGENT HMST MTR 8 SURGIFLO (HEMOSTASIS) ×2
ANTEGRADE CPLG (MISCELLANEOUS) ×2 IMPLANT
APL SRG 7X2 LUM MLBL SLNT (VASCULAR PRODUCTS) ×8
APPLICATOR COTTON TIP 6IN STRL (MISCELLANEOUS) IMPLANT
APPLICATOR TIP COSEAL (VASCULAR PRODUCTS) ×4 IMPLANT
ATRICLIP EXCLUSION 40 STD HAND (Clip) ×1 IMPLANT
ATTRACTOMAT 16X20 MAGNETIC DRP (DRAPES) ×3 IMPLANT
BAG DECANTER FOR FLEXI CONT (MISCELLANEOUS) ×2 IMPLANT
BANDAGE ELASTIC 4 VELCRO ST LF (GAUZE/BANDAGES/DRESSINGS) ×1 IMPLANT
BANDAGE ELASTIC 6 VELCRO ST LF (GAUZE/BANDAGES/DRESSINGS) ×1 IMPLANT
BLADE MINI 60D BLUE (BLADE) ×1 IMPLANT
BLADE NDL 3 SS STRL (BLADE) IMPLANT
BLADE NEEDLE 3 SS STRL (BLADE) ×3 IMPLANT
BLADE STERNUM SYSTEM 6 (BLADE) ×3 IMPLANT
BLADE SURG 10 STRL SS (BLADE) ×1 IMPLANT
BLADE SURG 11 STRL SS (BLADE) IMPLANT
BLADE SURG 15 STRL LF DISP TIS (BLADE) ×2 IMPLANT
BLADE SURG 15 STRL SS (BLADE) ×3
BLANKET WARM CARDIAC ADLT BAIR (MISCELLANEOUS) ×1 IMPLANT
BOOT SUTURE AID YELLOW STND (SUTURE) ×2 IMPLANT
CABLE PACING FASLOC BIEGE (MISCELLANEOUS) ×2 IMPLANT
CABLE PACING FASLOC BLUE (MISCELLANEOUS) ×1 IMPLANT
CANISTER SUCTION 2500CC (MISCELLANEOUS) ×3 IMPLANT
CANNULA GUNDRY RCSP 15FR (MISCELLANEOUS) ×2 IMPLANT
CANNULA VEN 2 STAGE (MISCELLANEOUS) ×1 IMPLANT
CANNULA VENOUS DUAL 32/40 (CANNULA) ×1 IMPLANT
CARDIOBLATE CARDIAC ABLATION (MISCELLANEOUS) ×3
CATH CPB KIT GERHARDT (MISCELLANEOUS) ×1 IMPLANT
CATH HEART VENT LEFT (CATHETERS) ×2 IMPLANT
CATH RETROPLEGIA CORONARY 14FR (CATHETERS) ×2 IMPLANT
CATH ROBINSON RED A/P 18FR (CATHETERS) ×2 IMPLANT
CATH/SQUID NICHOLS JEHLE COR (CATHETERS) ×1 IMPLANT
CAUTERY EYE LOW TEMP 1300F FIN (OPHTHALMIC RELATED) ×2 IMPLANT
CAUTERY HIGH TEMP VAS (MISCELLANEOUS) ×1 IMPLANT
CLIP FOGARTY SPRING 6M (CLIP) IMPLANT
CLIP TI MEDIUM 24 (CLIP) ×1 IMPLANT
CLIP TI WIDE RED SMALL 24 (CLIP) ×1 IMPLANT
CLOTH BEACON ORANGE TIMEOUT ST (SAFETY) ×3 IMPLANT
CONN ST 1/4X3/8  BEN (MISCELLANEOUS) ×3
CONN ST 1/4X3/8 BEN (MISCELLANEOUS) IMPLANT
CONT SPEC 4OZ CLIKSEAL STRL BL (MISCELLANEOUS) ×1 IMPLANT
COVER SURGICAL LIGHT HANDLE (MISCELLANEOUS) ×5 IMPLANT
CRADLE DONUT ADULT HEAD (MISCELLANEOUS) ×3 IMPLANT
DERMABOND ADVANCED (GAUZE/BANDAGES/DRESSINGS) ×1
DERMABOND ADVANCED .7 DNX12 (GAUZE/BANDAGES/DRESSINGS) IMPLANT
DEVICE CARDIOBLATE CARDIAC ABL (MISCELLANEOUS) IMPLANT
DRAIN CHANNEL 28F RND 3/8 FF (WOUND CARE) ×1 IMPLANT
DRAIN CHANNEL 32F RND 10.7 FF (WOUND CARE) ×1 IMPLANT
DRAPE CARDIOVASCULAR INCISE (DRAPES) ×3
DRAPE SLUSH MACHINE 52X66 (DRAPES) ×2 IMPLANT
DRAPE SLUSH/WARMER DISC (DRAPES) ×1 IMPLANT
DRAPE SRG 135X102X78XABS (DRAPES) ×2 IMPLANT
DRSG COVADERM 4X14 (GAUZE/BANDAGES/DRESSINGS) ×3 IMPLANT
ELECT BLADE 4.0 EZ CLEAN MEGAD (MISCELLANEOUS) ×3
ELECT CAUTERY BLADE 6.4 (BLADE) ×1 IMPLANT
ELECT REM PT RETURN 9FT ADLT (ELECTROSURGICAL) ×6
ELECTRODE BLDE 4.0 EZ CLN MEGD (MISCELLANEOUS) ×2 IMPLANT
ELECTRODE REM PT RTRN 9FT ADLT (ELECTROSURGICAL) ×4 IMPLANT
GLOVE BIO SURGEON STRL SZ 6 (GLOVE) ×6 IMPLANT
GLOVE BIO SURGEON STRL SZ 6.5 (GLOVE) ×14 IMPLANT
GLOVE BIOGEL PI IND STRL 6.5 (GLOVE) IMPLANT
GLOVE BIOGEL PI INDICATOR 6.5 (GLOVE) ×6
GOWN PREVENTION PLUS XLARGE (GOWN DISPOSABLE) ×4 IMPLANT
GOWN STRL NON-REIN LRG LVL3 (GOWN DISPOSABLE) ×20 IMPLANT
GRAFT GELWEAVE VALSALVA 28 (Prosthesis & Implant Heart) IMPLANT
GRAFT GELWEAVE VALSALVA 28CM (Prosthesis & Implant Heart) ×3 IMPLANT
HEMOSTAT POWDER SURGIFOAM 1G (HEMOSTASIS) ×12 IMPLANT
HEMOSTAT SURGICEL 2X14 (HEMOSTASIS) IMPLANT
INSERT FOGARTY XLG (MISCELLANEOUS) IMPLANT
KIT BASIN OR (CUSTOM PROCEDURE TRAY) ×3 IMPLANT
KIT PAIN CUSTOM (MISCELLANEOUS) IMPLANT
KIT ROOM TURNOVER OR (KITS) ×3 IMPLANT
KIT SUCTION CATH 14FR (SUCTIONS) ×4 IMPLANT
LEAD PACING MYOCARDI (MISCELLANEOUS) ×2 IMPLANT
LINE VENT (MISCELLANEOUS) ×1 IMPLANT
LOOP VESSEL SUPERMAXI WHITE (MISCELLANEOUS) ×2 IMPLANT
MATRIX HEMOSTAT SURGIFLO (HEMOSTASIS) ×1 IMPLANT
NDL 18GX1X1/2 (RX/OR ONLY) (NEEDLE) ×2 IMPLANT
NEEDLE 18GX1X1/2 (RX/OR ONLY) (NEEDLE) IMPLANT
NS IRRIG 1000ML POUR BTL (IV SOLUTION) ×15 IMPLANT
PACK OPEN HEART (CUSTOM PROCEDURE TRAY) ×3 IMPLANT
PAD ARMBOARD 7.5X6 YLW CONV (MISCELLANEOUS) ×6 IMPLANT
PENCIL BIPOLAR DISP STRAIT 18G (MISCELLANEOUS) ×1 IMPLANT
PENCIL BUTTON BLDE SNGL 10FT (ELECTRODE) ×1 IMPLANT
PROBE CRYO2-ABLATION MALLABLE (MISCELLANEOUS) IMPLANT
SEALANT SURG COSEAL 4ML (VASCULAR PRODUCTS) ×1 IMPLANT
SEALANT SURG COSEAL 8ML (VASCULAR PRODUCTS) ×3 IMPLANT
SENSOR MYOCARDIAL TEMP (MISCELLANEOUS) ×1 IMPLANT
SET CARDIOPLEGIA MPS 5001102 (MISCELLANEOUS) ×2 IMPLANT
SPOGE SURGIFLO 8M (HEMOSTASIS) ×1
SPONGE GAUZE 4X4 12PLY (GAUZE/BANDAGES/DRESSINGS) ×5 IMPLANT
SPONGE LAP 18X18 X RAY DECT (DISPOSABLE) ×6 IMPLANT
SPONGE SURGIFLO 8M (HEMOSTASIS) IMPLANT
STOPCOCK 4 WAY LG BORE MALE ST (IV SETS) IMPLANT
SUT ETHIBON 2 0 V 52N 30 (SUTURE) ×6 IMPLANT
SUT ETHIBON EXCEL 2-0 V-5 (SUTURE) IMPLANT
SUT ETHIBOND 2 0 SH (SUTURE) ×15
SUT ETHIBOND 2 0 SH 36X2 (SUTURE) IMPLANT
SUT ETHIBOND 2 0 V4 (SUTURE) IMPLANT
SUT ETHIBOND 2 0V4 GREEN (SUTURE) IMPLANT
SUT ETHIBOND V-5 VALVE (SUTURE) IMPLANT
SUT MNCRL AB 4-0 PS2 18 (SUTURE) ×3 IMPLANT
SUT PROLENE 3 0 RB 1 (SUTURE) ×2 IMPLANT
SUT PROLENE 3 0 SH 1 (SUTURE) ×1 IMPLANT
SUT PROLENE 3 0 SH DA (SUTURE) ×9 IMPLANT
SUT PROLENE 3 0 SH1 36 (SUTURE) ×1 IMPLANT
SUT PROLENE 4 0 RB 1 (SUTURE) ×27
SUT PROLENE 4 0 TF (SUTURE) ×2 IMPLANT
SUT PROLENE 4-0 RB1 .5 CRCL 36 (SUTURE) IMPLANT
SUT PROLENE 5 0 C 1 36 (SUTURE) ×6 IMPLANT
SUT PROLENE 6 0 C 1 30 (SUTURE) ×7 IMPLANT
SUT PROLENE 6 0 CC (SUTURE) ×2 IMPLANT
SUT PROLENE 7 0 BV1 MDA (SUTURE) ×1 IMPLANT
SUT SILK  1 MH (SUTURE) ×3
SUT SILK 1 MH (SUTURE) ×4 IMPLANT
SUT SILK 1 TIES 10X30 (SUTURE) ×2 IMPLANT
SUT SILK 2 0 (SUTURE)
SUT SILK 2 0 SH CR/8 (SUTURE) ×6 IMPLANT
SUT SILK 2-0 18XBRD TIE 12 (SUTURE) ×2 IMPLANT
SUT SILK 3 0 SH CR/8 (SUTURE) ×4 IMPLANT
SUT SILK 3 0SH CR/8 30 (SUTURE) ×1 IMPLANT
SUT SILK 4 0 (SUTURE)
SUT SILK 4-0 18XBRD TIE 12 (SUTURE) ×2 IMPLANT
SUT STEEL 6MS V (SUTURE) ×3 IMPLANT
SUT STEEL STERNAL CCS#1 18IN (SUTURE) ×1 IMPLANT
SUT STEEL SZ 6 DBL 3X14 BALL (SUTURE) ×3 IMPLANT
SUT TEM PAC WIRE 2 0 SH (SUTURE) ×4 IMPLANT
SUT VIC AB 1 CTX 18 (SUTURE) ×4 IMPLANT
SUT VIC AB 2-0 CT1 27 (SUTURE) ×9
SUT VIC AB 2-0 CT1 TAPERPNT 27 (SUTURE) IMPLANT
SUT VIC AB 2-0 CTX 27 (SUTURE) IMPLANT
SUT VIC AB 3-0 X1 27 (SUTURE) ×1 IMPLANT
SUTURE E-PAK OPEN HEART (SUTURE) ×1 IMPLANT
SYS ATRICLIP LAA EXCLUSION 45 (CLIP) IMPLANT
SYSTEM SAHARA CHEST DRAIN ATS (WOUND CARE) ×3 IMPLANT
TAPE CLOTH SURG 4X10 WHT LF (GAUZE/BANDAGES/DRESSINGS) ×1 IMPLANT
TAPES RETRACTO (MISCELLANEOUS) ×3 IMPLANT
TOWEL NATURAL 10PK STERILE (DISPOSABLE) ×1 IMPLANT
TOWEL OR 17X24 6PK STRL BLUE (TOWEL DISPOSABLE) ×4 IMPLANT
TOWEL OR 17X26 10 PK STRL BLUE (TOWEL DISPOSABLE) ×4 IMPLANT
TOWEL OR NON WOVEN STRL DISP B (DISPOSABLE) ×1 IMPLANT
TRAY FOLEY IC TEMP SENS 14FR (CATHETERS) ×3 IMPLANT
TUBE CONNECTING 12X1/4 (SUCTIONS) ×1 IMPLANT
TUBE FEEDING 8FR 16IN STR KANG (MISCELLANEOUS) ×3 IMPLANT
TUBE SUCT INTRACARD DLP 20F (MISCELLANEOUS) ×1 IMPLANT
UNDERPAD 30X30 INCONTINENT (UNDERPADS AND DIAPERS) ×3 IMPLANT
VALVE MAGNA EASE AORTIC 25MM (Prosthesis & Implant Heart) ×1 IMPLANT
VENT LEFT HEART 12002 (CATHETERS) ×3
WATER STERILE IRR 1000ML POUR (IV SOLUTION) ×6 IMPLANT
YANKAUER SUCT BULB TIP NO VENT (SUCTIONS) ×1 IMPLANT

## 2013-03-31 NOTE — Progress Notes (Signed)
Patient ID: Kayla Chambers, female   DOB: September 29, 1927, 79 y.o.   MRN: [id number]                   [address]            [place] [id number]          [phone]     Day of Surgery Procedure(s) (LRB): ASCENDING AORTIC ROOT REPLACEMENT (N/A) INTRAOPERATIVE TRANSESOPHAGEAL ECHOCARDIOGRAM (N/A) MAZE (N/A)  Total Length of Stay:  LOS: 0 days  BP 147/87  Pulse 58  Temp(Src) 97.6 F (36.4 C) (Oral)  Resp 20  Wt 118 lb 13.3 oz (53.9 kg)  BMI 21.05 kg/m2  SpO2 97%  .Intake/Output     05/13 0701 - 05/14 0700   I.V. (mL/kg) 4500 (83.5)   Blood 2588   IV Piggyback 1000   Total Intake(mL/kg) 8088 (150.1)   Urine (mL/kg/hr) 3600 (5)   Blood 2500 (3.4)   Total Output 6100   Net +1988         . sodium chloride 20 mL/hr (03/31/13 2000)  . sodium chloride 20 mL (03/31/13 2000)  . [START ON 04/01/2013] sodium chloride    . amiodarone (NEXTERONE PREMIX) 360 mg/200 mL dextrose 30 mg/hr (03/31/13 2000)  . dexmedetomidine 0.7 mcg/kg/hr (03/31/13 2000)  . insulin (NOVOLIN-R) infusion 3 Units/hr (03/31/13 2000)  . lactated ringers 20 mL/hr (03/31/13 2000)  . milrinone    . nitroGLYCERIN    . phenylephrine (NEO-SYNEPHRINE) Adult infusion       Lab Results  Component Value Date   WBC 9.5 03/31/2013   HGB 8.9* 03/31/2013   HCT 24.6* 03/31/2013   PLT 81* 03/31/2013   GLUCOSE 160* 03/31/2013   ALT 13 03/26/2013   AST 18 03/26/2013   NA 140 03/31/2013   K 3.2* 03/31/2013   CL 103 03/26/2013   CREATININE 0.62 03/26/2013   BUN 15 03/26/2013   CO2 22 03/26/2013   TSH 2.187 01/30/2013   INR 1.58* 03/31/2013   HGBA1C 5.1 03/26/2013   Stable early post op in ICU, not bleeding   Delight Ovens MD  Beeper [phone] Office [phone] 03/31/2013 8:29 PM

## 2013-03-31 NOTE — Progress Notes (Signed)
  Echocardiogram Echocardiogram Transesophageal has been performed.  Kayla Chambers 03/31/2013, 10:23 AM

## 2013-03-31 NOTE — Anesthesia Preprocedure Evaluation (Addendum)
Anesthesia Evaluation  Patient identified by MRN, date of birth, ID band Patient awake    Reviewed: Allergy & Precautions, H&P , NPO status , Patient's Chart, lab work & pertinent test results, reviewed documented beta blocker date and time   Airway Mallampati: II TM Distance: <3 FB Neck ROM: Full  Mouth opening: Limited Mouth Opening  Dental  (+) Teeth Intact   Pulmonary shortness of breath,  breath sounds clear to auscultation  Pulmonary exam normal       Cardiovascular + CAD and + Peripheral Vascular Disease + dysrhythmias Atrial Fibrillation + Valvular Problems/Murmurs AI Rhythm:Irregular Rate:Normal     Neuro/Psych  Headaches, Anxiety Intracranial aneurysm    GI/Hepatic   Endo/Other  Hypothyroidism   Renal/GU      Musculoskeletal  (+) Fibromyalgia -  Abdominal Normal abdominal exam  (+)   Peds  Hematology   Anesthesia Other Findings   Reproductive/Obstetrics                          Anesthesia Physical Anesthesia Plan  ASA: IV  Anesthesia Plan: General   Post-op Pain Management:    Induction: Intravenous  Airway Management Planned: Oral ETT  Additional Equipment: Arterial line, CVP, PA Cath, 3D TEE and Ultrasound Guidance Line Placement  Intra-op Plan:   Post-operative Plan: Post-operative intubation/ventilation  Informed Consent: I have reviewed the patients History and Physical, chart, labs and discussed the procedure including the risks, benefits and alternatives for the proposed anesthesia with the patient or authorized representative who has indicated his/her understanding and acceptance.   Dental advisory given  Plan Discussed with: CRNA and Surgeon  Anesthesia Plan Comments:        Anesthesia Quick Evaluation

## 2013-03-31 NOTE — H&P (Signed)
[address]       [place] [address]             [phone]     Kayla Chambers  [facility] Medical Record #[id number]  Date of Birth: Sep 05, 1927  Referring: Dr Ronny Flurry  Primary Care: Kayla Rubenstein, MD  Ortho: Dr. Tamsen Meek  Chief Complaint: SOB and new Afib  History of Present Illness:   Patient is an 79 year old female followed by Dr. Ronny Flurry. 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 She presented to the office with increasing nocturnal dyspnea orthopnea and rapid heart rate. She was found to be in new onset of atrial fibrillation and was hospitalized. Followup including echocardiogram and cardiac catheterization confirms significant dilatation of the aortic root( 6.2 cm by echo) and moderate to moderately severe aortic insufficiency. She has a history of rheumatic fever at age 54. She's had no previous history of myocardial infarction. 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  She had a followup scan of the lower extremity at. [facility] and was seen by orthopedic surgery , the note note indicated that there was some slight increase in size of the mass at the old excision site but was too small to biopsy at this time .  With the degree of aortic insufficiency and the size of her aortic root cardiac surgery was  recommended to her .     Current Activity/ Functional Status:  Patient is independent with mobility/ambulation, transfers, ADL's, IADL's.  Zubrod Score:  At the time of surgery this patient's most appropriate activity status/level should be described as:  [ ]  Normal activity, no symptoms  [X]  Symptoms, fully ambulatory  [ ]  Symptoms, in bed less than or equal to 50% of the time  [ ]  Symptoms, in bed greater than 50% of the time but less than 100%  [ ]  Bedridden  [ ]  Moribund  Past Medical History   Diagnosis  Date   .  Hypothyroidism    .  Heart palpitations    .  Dilated  aortic root      Marked dilatation of aortic root per echo in 2011 with severe AI, mild to moderate MR, moderate TR   .  Aortic insufficiency      severe per echo in 2012   .  Polymyalgia rheumatica    .  Vertigo    .  Peripheral neuropathy    .  Intracranial aneurysm      in late 1970's   .  Osteoporosis    .  Rheumatic heart disease    .  Heart murmur    .  Hyperthyroidism    .  Headache      hx migraines   .  Anxiety    .  Dysrhythmia    .  Fibromyalgia    .  Dilated aortic root 6.2 cm with moderate AI  03/06/2011   .  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 Neoadjuvant RT by Dr. Abelardo Diesel. Surveillance plan: MRI 12/08/11 shows changes in presumed post-op seroma/hematoma with slight increase in size - follow up MRI 11/18/12 showed same size. Will continue with q3 month MRI. CT C/A/P annually alternating with CXR at 6 months.    Past Surgical History   Procedure  Laterality  Date   .  Sarcoma resection   2011   .  Abdominal hysterectomy     .  Bladder suspension     .  Eye surgery     .  Colonoscopy   10/25/2011     Procedure: COLONOSCOPY; Surgeon: Charolett Bumpers, MD; Location: [facility]; Service: Endoscopy; Laterality: N/A;    History   Smoking status   .  Never Smoker   Smokeless tobacco   .  Never Used    History   Alcohol Use  No    Allergies   Allergen  Reactions   .  Alendronate Sodium  Nausea And Vomiting   .  Lyrica (Pregabalin)  Nausea And Vomiting   .  Neurontin (Gabapentin)  Nausea And Vomiting   .  Procaine Hcl    .  Toprol Xl (Metoprolol Succinate)  Other (See Comments)     Does not remember    Current Facility-Administered Medications   Medication  Dose  Route  Frequency  Provider  Last Rate  Last Dose   .  0.9 % sodium chloride infusion  250 mL  Intravenous  PRN  Cassell Clement, MD     .  acetaminophen (TYLENOL) tablet  650 mg  650 mg  Oral  Q4H PRN  Cassell Clement, MD     .  ALPRAZolam Prudy Feeler) tablet 0.25 mg  0.25 mg  Oral  BID PRN  Cassell Clement, MD     .  aspirin EC tablet 81 mg  81 mg  Oral  Daily  Cassell Clement, MD   81 mg at 02/03/13 0954   .  diltiazem (CARDIZEM) tablet 30 mg  30 mg  Oral  Q6H  Ok Anis, NP   30 mg at 02/03/13 4098   .  furosemide (LASIX) tablet 20 mg  20 mg  Oral  Daily  Cassell Clement, MD   20 mg at 02/03/13 0954   .  heparin injection 5,000 Units  5,000 Units  Subcutaneous  Q8H  Cassell Clement, MD     .  levothyroxine (SYNTHROID, LEVOTHROID) tablet 75 mcg  75 mcg  Oral  QAC breakfast  Cassell Clement, MD   75 mcg at 02/03/13 0954   .  lisinopril (PRINIVIL,ZESTRIL) tablet 2.5 mg  2.5 mg  Oral  Daily  Cassell Clement, MD   2.5 mg at 02/03/13 0954   .  metoprolol tartrate (LOPRESSOR) tablet 12.5 mg  12.5 mg  Oral  BID  Cassell Clement, MD   12.5 mg at 02/03/13 0954   .  ondansetron (ZOFRAN) injection 4 mg  4 mg  Intravenous  Q6H PRN  Cassell Clement, MD     .  sodium chloride 0.9 % injection 3 mL  3 mL  Intravenous  PRN  Cassell Clement, MD     .  sodium chloride 0.9 % injection 3 mL  3 mL  Intravenous  PRN  Cassell Clement, MD     .  zolpidem (AMBIEN) tablet 5 mg  5 mg  Oral  QHS PRN  Dayna N Dunn, PA-C   5 mg at 02/02/13 2146    Prescriptions prior to admission   Medication  Sig  Dispense  Refill   .  ALPRAZolam (XANAX) 0.5 MG tablet  Take 0.5 tablets (0.25 mg total) by mouth at bedtime as needed. sleep  30 tablet  5   .  aspirin EC 81 MG tablet  Take 81 mg by mouth daily.     Marland Kitchen  atenolol (TENORMIN) 25 MG tablet  Take 25 mg by mouth at bedtime.     Marland Kitchen  b complex vitamins tablet  Take 1 tablet by mouth daily.     .  Calcium Carbonate-Vitamin D (CALCIUM + D PO)  Take 1 tablet by mouth 3 (three) times daily.     Marland Kitchen  CRANBERRY PO  Take 1 tablet by mouth 3 (three) times daily.     .  fish oil-omega-3 fatty acids 1000 MG capsule  Take 1 g by mouth 2 (two) times  daily.     Marland Kitchen  levothyroxine (SYNTHROID, LEVOTHROID) 75 MCG tablet  Take 75 mcg by mouth daily.     .  multivitamin (THERAGRAN) per tablet  Take 1 tablet by mouth daily.     .  Probiotic Product (PROBIOTIC DAILY PO)  Take 1 capsule by mouth daily.      Family History   Problem  Relation  Age of Onset   .  Heart disease  Mother    .  Stroke  Mother    .  Arthritis  Father    .  Cancer  Father    .  Hypertension  Sister    .  Anesthesia problems  Brother    Review of Systems:  Cardiac Review of Systems: Y or N  Chest Pain [n ] Resting SOB [ n ] Exertional SOB [ y ] Orthopnea Cove.Etienne ]  Pedal Edema [ y ] Palpitations [ y ] Syncope [ n ] Presyncope [ n ]  General Review of Systems: [Y] = yes [ ] =no  Constitional: recent weight change [n ]; anorexia [ ] ; fatigue Cove.Etienne ]; nausea [ ] ; night sweats [ ] ; fever [ ] ; or chills [ ]   Dental: poor dentition[y ];  Eye : blurred vision [ ] ; diplopia [ ] ; vision changes [ ] ; Amaurosis fugax[ ] ;  Resp: cough [ ] ; wheezing[n ]; hemoptysis[ ] ; shortness of breath[ ] ; paroxysmal nocturnal dyspnea[y ]; dyspnea on exertion[y ]; or orthopnea[ ] ;  GI: gallstones[ ] , vomiting[ ] ; dysphagia[ ] ; melena[ ] ; hematochezia [ ] ; heartburn[ ] ; Hx of Colonoscopy[y ];  GU: kidney stones [ ] ; hematuria[ ] ; dysuria [ ] ; nocturia[ ] ; history of obstruction [ ] ; urinary frequency [ ]   Skin: rash, swelling[ ] ;, hair loss[ ] ; peripheral edema[ ] ; or itching[ ] ;  Musculosketetal: myalgias[ ] ; joint swelling[ ] ; joint erythema[ ] ;  joint pain[ ] ; back pain[ ] ;  Heme/Lymph: bruising[ ] ; bleeding[ ] ; anemia[ ] ;  Neuro: TIA[ n ]; headaches[ ] ; stroke[ ] ; vertigo[ ] ; seizures[ ] ; paresthesias[ ] ; difficulty walking[ ] ;  Psych:depression[ ] ; anxiety[ ] ;  Endocrine: diabetes[ ] ; thyroid dysfunction[ ] ;  Immunizations: Flu [ y ]; Pneumococcal[ n ];  Other:  Physical Exam:  BP 121/75  Pulse 90  Temp(Src) 97.3 F (36.3 C) (Axillary)  Resp 18  Ht 5\' 5"  (1.651 m)  Wt 114 lb (51.71 kg)   BMI 18.97 kg/m2  SpO2 97%  General appearance: alert, cooperative and appears stated age  Neurologic: intact  Heart: irregularly irregular rhythm and diastolic murmur: holodiastolic 3/6, crescendo at lower left sternal border  Lungs: clear to auscultation bilaterally  Abdomen: soft, non-tender; bowel sounds normal; no masses, no organomegaly  Extremities: extremities normal, atraumatic, no cyanosis or edema, edema left lower leg and cath site rt groin without hematoma  Wound: left inner thigh scar or sarcoma resection and some superior swelling. skin discoloation due to radiation   Diagnostic Studies & Laboratory data:  ECHO:  ------------------------------------------------------------  LV EF: 50% - 55%  ------------------------------------------------------------ Indications: Atrial fibrillation - 427.31.  ------------------------------------------------------------ History: PMH: AI  Dialated Aortic Root Dizziness  Atrial fibrillation.  ------------------------------------------------------------ Study Conclusions  - Left ventricle: The cavity size was moderately dilated. Wall thickness was normal. Systolic function was normal. The estimated ejection fraction was in the range of 50% to 55%. Wall motion was normal; there were no regional wall motion abnormalities. - Aortic valve: Moderate regurgitation. Transthoracic echocardiography. M-mode, complete 2D, spectral Doppler, and color Doppler. Height: Height: 165.1cm. Height: 65in. Weight: Weight: 52.1kg. Weight: 114.6lb. Body mass index: BMI: 19.1kg/m^2. Body surface area: BSA: 1.70m^2. Blood pressure: 94/53. Patient status: Inpatient. Location: Bedside.  ------------------------------------------------------------  ------------------------------------------------------------ Left ventricle: The cavity size was moderately dilated. Wall thickness was normal. Systolic function was normal. The estimated ejection fraction  was in the range of 50% to 55%. Wall motion was normal; there were no regional wall motion abnormalities.  ------------------------------------------------------------ Aortic valve: Doppler: Moderate regurgitation.  ------------------------------------------------------------ Aorta: There was severe dilation of the sinus(es) of Valsalva.  ------------------------------------------------------------ Mitral valve: Structurally normal valve. Doppler: Trivial regurgitation.  ------------------------------------------------------------ Left atrium: The atrium was normal in size.  ------------------------------------------------------------ Right ventricle: The cavity size was normal. Systolic function was normal.  ------------------------------------------------------------ Pulmonic valve: Doppler: Trivial regurgitation.  ------------------------------------------------------------ Tricuspid valve: The valve appears to be grossly normal. Doppler: Trivial regurgitation.  ------------------------------------------------------------ Right atrium: The atrium was normal in size.  ------------------------------------------------------------ Pericardium: There was no pericardial effusion.  ------------------------------------------------------------  2D measurements Normal Doppler measurements Normal Left ventricle Left ventricle LVID ED, 52.4 mm 43-52 Ea, med ann, 5.04 cm/s ------ chord, tiss DP PLAX Aortic valve LVID ES, 34.1 mm 23-38 Regurg PHT 337 ms ------ chord, Tricuspid valve PLAX Regurg peak 225 cm/s ------ FS, chord, 35 % >29 vel PLAX Peak RV-RA 20 mm ------ LVPW, ED 9.94 mm ------ gradient, S Hg IVS/LVPW 1.26 <1.3 Right ventricle ratio, ED Sa vel, lat 10.2 cm/s ------ Ventricular septum ann, tiss DP IVS, ED 12.5 mm ------ LVOT Diam, S 20 mm ------ Area 3.14 cm^2 ------ Aorta Root diam, 62 mm ------ ED Left atrium AP dim 33 mm ------ AP dim 2.14 cm/m^2  <2.2 index  ------------------------------------------------------------ Prepared and Electronically Authenticated by  Kristeen Miss 2014-03-15T12:15:[id number]  CATH:  Procedural Findings:  Hemodynamics  RA 5  RV 25/5  PA 26/14  PCWP 15  LV 130/13  AO 129/65 (89)f  Oxygen saturations:  SVC 55%  PA 63%  AO 96%  Cardiac Output (Fick) 2.87 L/min/m2  Cardiac Index (Fick) 1.81 L/min/m2  Cardiac Output (Thermo) 2.92 L/min  Cardiac Index (Thermo) 1.84 L/min/m2  Coronary angiography:  Coronary dominance: right  Left mainstem: Short and without obstruction  Left anterior descending (LAD): Moderate calcification with about 30% luminal irregularity proximally. No critical obstruction. The vessel terminates as an apical LAD and large distal diagonal.  Left circumflex (LCx): Consists mainly of a large OM branch. There is about 40% segmental plaquing just beyond the takeoff of the small AV circumflex.  Right coronary artery (RCA): Large caliber vessel with 30% proximal narrowing that is eccentric. There is a large caliber PDA and smaller PDA, both free of major disease.  Left ventriculography: There was ectopy with LV angiography, so assessment of LV function is not reliable. EF estimate would be 50%, but unreliable.  Aortography reveals a very large aortic root down involving the sinus of valsalva. There is mild calcification of the valve. There is moderately severe aortic regurgitation (3 plus) noted.  Final Conclusions:  1. Aortic  regurgitation, moderately severe  2. Dilated LV with likely reduction in LV function  3. Recent onset of atrial fibrillation  4. Moderate coronary calcification, without critical obstruction.  Recommendations:  1. Defer surgical decisions to patient with Dr Patty Sermons.  2. Rate control.  3. Given AI and wide pulse pressure, and very small size will defer reanticoagulation to am. Would consider low dose heparin for 1-2 days before consideration a NOAC given AI and  risk of bleeding.  Shawnie Pons MD, [facility], FSCAI  02/02/2013, 7:00 PM  Recent Radiology Findings:  Dg Chest 2 View  01/30/2013 *RADIOLOGY REPORT* Clinical Data: Shortness of breath with deep breath. History of atrial fibrillation and aortic insufficiency. CHEST - 2 VIEW Comparison: 10/29/2012 Findings: Borderline heart size with normal pulmonary vascularity. Emphysematous changes in the lungs. Slight interstitial fibrosis diffusely with mild focal scarring in the left lung base. This appears stable since previous study. No focal consolidation or airspace disease in the lungs. No blunting of costophrenic angles. No pneumothorax. Mediastinal contours appear intact. Degenerative changes in the thoracic spine. No significant change since previous study. IMPRESSION: Emphysematous changes and scarring in the lungs. No evidence of active pulmonary disease. Original Report Authenticated By: Burman Nieves, M.D.  MR THIGH/FEMUR LEFT WWO CONTRAST (TUMOR) (11/18/2012 8:52 AM EST)  Impressions   Likely evolving seroma/hematoma at the surgical site in the proximal medial left thigh which is unchanged in size in comparison to recent study from 10/07/2012 but larger in comparison to prior study from 01/08/2012.  Tumor recurrence cannot be completely excluded.  This could be further evaluated with percutaneous biopsy and/or followup MRI in 3 to 6 months.  Findings discussed with Dr. Jana Half by Dr. Revonda Standard at 10:00 a.m. on 11/18/2012.    Narrative   MR LEFT THIGH/FEMUR WITH AND WITHOUT CONTRAST (TUMOR PROTOCOL), Nov 18, 2012 08:52:00 AM  .  INDICATION: eval for tumor recurrence171.9 Myxoid liposarcoma  COMPARISON: Multiple prior most recently from 10/07/2012, and 01/08/2012.  Marland Kitchen  TECHNIQUE: Multiplanar, multisequence MR images of the left thigh/femur were obtained before and after intravenous administration of gadolinium-based contrast.  .  FINDINGS:  Postsurgical changes of tumor resection within the medial aspect  of the upper thigh with associated architectural distortion of the soft tissues.  There is a 3.4 x 2.9 x 5.8 cm postoperative collection within the resection bed (series 10 image 33 series 7 image 13 ).  It measured 2.4 x 2.1 x 6.5 cm on prior examination from 12/29/2011 but is unchanged from 10/07/2012.  There is a dark peripheral rim (low on T1 and T2) suggesting hemosiderin.  There is an internal high T1 signal component which may related to blood products/hemorrhage (series 16, image 33).  There is no surrounding abnormal increased T2 signal. No visualized lymphadenopathy. No nodular or masslike enhancement. No new lesion.  Similar feathery increased T2 signal surrounding the femoral vessels and within the remaining adductors likely representing posttreatment changes.  Patchy marrow signal within the femoral diaphysis like related to post radiation changes.  Superior and anterosuperior chondrolabral separation and degenerative changes of the labrum.  Mild degenerative changes of the pubic symphysis and left sacroiliac joint.  Colonic diverticulosis without MR evidence of diverticulitis.  Please note the study is not tailored to the intrinsic structures of the knee or hip.   CT of Chest abdomen:  Result Narrative  CT INFUSED CHEST, ABDOMEN AND PELVIS, Oct 07, 2012 11:38:00 AM . INDICATION: eval for chest mets171.9 Myxoid liposarcoma  COMPARISON: Most recent CT CAP 01/08/2012. Marland Kitchen  TECHNIQUE: After administration of oral contrast and water, 125 cc of IV contrast material were infused, and dynamic thin-section images were obtained of the chest, abdomen, and pelvis in the portal venous phase at approximately 60 sec.  . ADDITIONAL TECHNIQUE: None . CHEST: . Chest wall/thoracic inlet: Within normal limits. . Thyroid: Within normal limits. . Mediastinum/hila: No mediastinal or hilar adenopathy. Patulous esophagus. Marland Kitchen Heart/vessels: Mild cardiomegaly. Trace pericardial effusion . Aneurysmal dilation  of the aortic root and ascending aorta measuring 4.7 cm maximally, similar to recent imaging from February 2013. Atherosclerotic calcifications of the aorta and coronary arteries. . Lungs: Biapical pleural parenchymal scarring as well as scarring within the lung bases and lingula. No suspicious appearing pulmonary nodules. Stable appearance of small pulmonary nodules dating back over 2 years, likely reflecting benign disease. No consolidative changes. . Pleura: Within normal limits. . ABDOMEN: . Liver: Similar appearance of the liver with scattered peripherally enhancing lesions, most consistent with hemangiomas. There are additional too small to characterize low attenuating lesions again noted. . Gallbladder/Biliary: Within normal limits. . Spleen: Within normal limits. . Pancreas: Within normal limits. . Adrenals: Within normal limits. . Kidneys: Scattered too small to characterize low attenuating lesions the kidneys. . Peritoneum/Mesenteries: Within normal limits. . Extraperitoneum: Within normal limits. . Gastrointestinal tract: Colonic diverticulosis. No bowel obstruction. Normal appendix. . Vascular: Aortobiiliac atherosclerotic calcifications without aneurysm. Marland Kitchen PELVIS: . Peritoneum: Within normal limits. . Extraperitoneum: Within normal limits. . Ureters: Within normal limits. . Bladder: Within normal limits. . Reproductive System: Hysterectomy. . Vascular: Prominent venous collaterals in the pelvis, as can be seen with pelvic congestion syndrome. Ectasia of the left common iliac artery just proximal to its bifurcation measuring 18 mm, similar to prior (image 177). . MSK: No acute osseous abnormality. Degenerative changes of the spine. Osteopenia. . ADDITIONAL COMMENTS: None. .  Recent Lab Findings:  Lab Results   Component  Value  Date    WBC  6.4  02/03/2013    HGB  13.0  02/03/2013    HCT  37.9  02/03/2013    PLT  144*  02/03/2013    GLUCOSE  101*  02/02/2013    ALT  60*   01/30/2013    AST  35  01/30/2013    NA  141  02/02/2013    K  3.7  02/02/2013    CL  107  02/02/2013    CREATININE  0.61  02/02/2013    BUN  16  02/02/2013    CO2  25  02/02/2013    TSH  2.187  01/30/2013    INR  1.00  01/30/2013   Assessment / Plan:    Dilated aortic root 6.2 cm With mod/severe AI and Short left main without coronary occlusive disease  Aortic Size Index=    6.2     /Body surface area is 1.57 meters squared. =3.9  < 2.75 cm/m2      4% risk per year 2.75 to 4.25          8% risk per year > 4.25 cm/m2    20% risk per year    New onset Afib, transient  History of Liposarcoma low grade respected and treated with preop radiation  Stable pulmonary nodules by CT scan done at [facility]  I have discussed the risks and options of aortic root descending aorta and aortic valve replacement with the patient and her husband. I discussed discussed with the patient with the current size of her aorta that the  risk of an acute event may be 20% per year, independent of the moderate to severe aortic insufficiency. On the patient's last visit she had declined surgical intervention, she now returns to further discuss surgical options. Since I last saw her she has now decided that she is willing to proceed with replacement of aortic root and aortic valve we'll consider possible maze.  UTI noted last week and treated with 5 days of cipro  The goals risks and alternatives of the planned surgical procedure aortic root replacement and aortic valve replacement possible maze  have been discussed with the patient in detail. The risks of the procedure including death, infection, stroke, myocardial infarction, bleeding, blood transfusion have all been discussed specifically.  I have quoted Jake Michaelis a 8% % of perioperative mortality and a complication rate as high as 25% %. The patient's questions have been answered.Kayla Chambers is willing  to proceed with the planned procedure.   Delight Ovens MD  Beeper [phone] Office [phone] 03/31/2013 7:09 AM

## 2013-03-31 NOTE — Transfer of Care (Signed)
Immediate Anesthesia Transfer of Care Note  Patient: Kayla Chambers  Procedure(s) Performed: Procedure(s): ASCENDING AORTIC ROOT REPLACEMENT (N/A) INTRAOPERATIVE TRANSESOPHAGEAL ECHOCARDIOGRAM (N/A) MAZE (N/A)  Patient Location: SICU  Anesthesia Type:General  Level of Consciousness: Patient remains intubated per anesthesia plan  Airway & Oxygen Therapy: Patient remains intubated per anesthesia plan and Patient placed on Ventilator (see vital sign flow sheet for setting)  Post-op Assessment: Post -op Vital signs reviewed and stable  Post vital signs: Reviewed and stable  Complications: No apparent anesthesia complications

## 2013-03-31 NOTE — Preoperative (Signed)
Beta Blockers   Reason not to administer Beta Blockers:Not Applicable 

## 2013-03-31 NOTE — Anesthesia Procedure Notes (Addendum)
Date/Time: 03/31/2013 7:30 AM Performed by: Brenon Antosh, Nuala Alpha Pre-anesthesia Checklist: Patient identified, Emergency Drugs available, Suction available, Patient being monitored and Timeout performed Patient Re-evaluated:Patient Re-evaluated prior to inductionOxygen Delivery Method: Circle system utilized Preoxygenation: Pre-oxygenation with 100% oxygen Intubation Type: IV induction Ventilation: Mask ventilation without difficulty Laryngoscope Size: Mac and 3 Grade View: Grade I Tube type: Oral Tube size: 8.0 mm Number of attempts: 1 Placement Confirmation: ETT inserted through vocal cords under direct vision,  positive ETCO2,  CO2 detector and breath sounds checked- equal and bilateral Secured at: 21 cm Tube secured with: Tape Dental Injury: Teeth and Oropharynx as per pre-operative assessment

## 2013-03-31 NOTE — Anesthesia Postprocedure Evaluation (Signed)
  Anesthesia Post-op Note  Patient: Kayla Chambers  Procedure(s) Performed: Procedure(s): ASCENDING AORTIC ROOT REPLACEMENT (N/A) INTRAOPERATIVE TRANSESOPHAGEAL ECHOCARDIOGRAM (N/A) MAZE (N/A)  Patient Location: ICU  Anesthesia Type:General  Level of Consciousness: sedated  Airway and Oxygen Therapy: Patient remains intubated per anesthesia plan  Post-op Pain: none  Post-op Assessment: Post-op Vital signs reviewed, Patient's Cardiovascular Status Stable and Respiratory Function Stable  Post-op Vital Signs: Reviewed and stable  Complications: No apparent anesthesia complications

## 2013-03-31 NOTE — Brief Op Note (Addendum)
03/31/2013  12:55 PM  PATIENT:  Kayla Chambers  79 y.o. female  PRE-OPERATIVE DIAGNOSIS:  1.Dilation of ascending aorta and root 2.Severe aortic Insufficiency   POST-OPERATIVE DIAGNOSIS:  1.Dilation of ascending aorta and root 2.Severe aortic Insufficiency,  Vfib arrest in OR resulting in vein graft to RCA   PROCEDURE: INTRAOPERATIVE TRANSESOPHAGEAL ECHOCARDIOGRAM, MEDIAN STERNOTOMY fo ASCENDING AORTIC ROOT REPLACEMENT (using a Gel Weave straight graft) and REPLACEMENT OF AORTIC VALVE (using a Pericardial tissue valve, size 25 mm),RE IMPLANTATION OF CORONARY ARTERIES,MAZE, and CLIP OF LEFT ATRIAL; followed by CABG x 1 (SVG to RCA) with OPEN HARVEST OF RIGHT THIGH SAPHENOUS VEIN  SURGEON:  Surgeon(s) and Role:    * Delight Ovens, MD - Primary Asst Surgeon : Dr Laneta Simmers PHYSICIAN ASSISTANT: Doree Fudge PA-C   ANESTHESIA:   general  EBL:  Total I/O In: 1500 [I.V.:1500] Out: 2000 [Urine:2000]   DRAINS: Chest Tube(s) in the Mediastinal and pleural spaces    SPECIMEN:  Source of Specimen:  Native aortic valve leaflets and thoracic aortic aneurysm  DISPOSITION OF SPECIMEN:  PATHOLOGY  COUNTS CORRECT:  YES  TOUDICTATION: .Dragon Dictation  PLAN OF CARE: Admit to inpatient   PATIENT DISPOSITION:  ICU - intubated and hemodynamically stable.   Delay start of Pharmacological VTE agent (>24hrs) due to surgical blood loss or risk of bleeding: yes  PRE OP WEIGHT: 54 kg

## 2013-04-01 ENCOUNTER — Inpatient Hospital Stay (HOSPITAL_COMMUNITY): Payer: Medicare Other

## 2013-04-01 LAB — BASIC METABOLIC PANEL
CO2: 25 mEq/L (ref 19–32)
Calcium: 7 mg/dL — ABNORMAL LOW (ref 8.4–10.5)
Creatinine, Ser: 0.45 mg/dL — ABNORMAL LOW (ref 0.50–1.10)
Glucose, Bld: 112 mg/dL — ABNORMAL HIGH (ref 70–99)
Sodium: 138 mEq/L (ref 135–145)

## 2013-04-01 LAB — POCT I-STAT, CHEM 8
BUN: 5 mg/dL — ABNORMAL LOW (ref 6–23)
Calcium, Ion: 1.13 mmol/L (ref 1.13–1.30)
Chloride: 103 mEq/L (ref 96–112)
Creatinine, Ser: 0.6 mg/dL (ref 0.50–1.10)
Glucose, Bld: 166 mg/dL — ABNORMAL HIGH (ref 70–99)
HCT: 25 % — ABNORMAL LOW (ref 36.0–46.0)
Hemoglobin: 8.5 g/dL — ABNORMAL LOW (ref 12.0–15.0)
Potassium: 3.4 mEq/L — ABNORMAL LOW (ref 3.5–5.1)
Sodium: 138 mEq/L (ref 135–145)
TCO2: 23 mmol/L (ref 0–100)

## 2013-04-01 LAB — POCT I-STAT 3, ART BLOOD GAS (G3+)
Acid-base deficit: 1 mmol/L (ref 0.0–2.0)
Acid-base deficit: 3 mmol/L — ABNORMAL HIGH (ref 0.0–2.0)
Bicarbonate: 23.9 mEq/L (ref 20.0–24.0)
Bicarbonate: 22.6 mEq/L (ref 20.0–24.0)
O2 Saturation: 100 %
O2 Saturation: 96 %
Patient temperature: 96.6
Patient temperature: 36.3
TCO2: 25 mmol/L (ref 0–100)
TCO2: 24 mmol/L (ref 0–100)
pCO2 arterial: 36.6 mmHg (ref 35.0–45.0)
pCO2 arterial: 38.3 mmHg (ref 35.0–45.0)
pH, Arterial: 7.419 (ref 7.350–7.450)
pH, Arterial: 7.375 (ref 7.350–7.450)
pO2, Arterial: 188 mmHg — ABNORMAL HIGH (ref 80.0–100.0)
pO2, Arterial: 84 mmHg (ref 80.0–100.0)

## 2013-04-01 LAB — CBC
HCT: 25.1 % — ABNORMAL LOW (ref 36.0–46.0)
Hemoglobin: 8.4 g/dL — ABNORMAL LOW (ref 12.0–15.0)
MCH: 30.8 pg (ref 26.0–34.0)
MCV: 85.7 fL (ref 78.0–100.0)
RBC: 2.73 MIL/uL — ABNORMAL LOW (ref 3.87–5.11)
RDW: 15.6 % — ABNORMAL HIGH (ref 11.5–15.5)
WBC: 10.9 10*3/uL — ABNORMAL HIGH (ref 4.0–10.5)

## 2013-04-01 LAB — PREPARE FRESH FROZEN PLASMA
Unit division: 0
Unit division: 0

## 2013-04-01 LAB — GLUCOSE, CAPILLARY
Glucose-Capillary: 114 mg/dL — ABNORMAL HIGH (ref 70–99)
Glucose-Capillary: 138 mg/dL — ABNORMAL HIGH (ref 70–99)
Glucose-Capillary: 83 mg/dL (ref 70–99)
Glucose-Capillary: 98 mg/dL (ref 70–99)
Glucose-Capillary: 94 mg/dL (ref 70–99)
Glucose-Capillary: 112 mg/dL — ABNORMAL HIGH (ref 70–99)
Glucose-Capillary: 128 mg/dL — ABNORMAL HIGH (ref 70–99)
Glucose-Capillary: 156 mg/dL — ABNORMAL HIGH (ref 70–99)
Glucose-Capillary: 166 mg/dL — ABNORMAL HIGH (ref 70–99)
Glucose-Capillary: 154 mg/dL — ABNORMAL HIGH (ref 70–99)
Glucose-Capillary: 177 mg/dL — ABNORMAL HIGH (ref 70–99)

## 2013-04-01 LAB — PREPARE PLATELET PHERESIS: Unit division: 0

## 2013-04-01 LAB — CREATININE, SERUM
Creatinine, Ser: 0.5 mg/dL (ref 0.50–1.10)
GFR calc Af Amer: >90 mL/min (ref >90)
GFR calc non Af Amer: 87 mL/min — ABNORMAL LOW (ref >90)

## 2013-04-01 LAB — MAGNESIUM: Magnesium: 2.1 mg/dL (ref 1.5–2.5)

## 2013-04-01 MED ORDER — INSULIN ASPART 100 UNIT/ML ~~LOC~~ SOLN
0.0000 [IU] | SUBCUTANEOUS | Status: DC
  Administered 2013-04-01 (×2): 2 [IU] via SUBCUTANEOUS

## 2013-04-01 MED ORDER — FUROSEMIDE 10 MG/ML IJ SOLN
40.0000 mg | Freq: Once | INTRAMUSCULAR | Status: AC
  Administered 2013-04-01: 40 mg via INTRAVENOUS
  Filled 2013-04-01: qty 4

## 2013-04-01 MED ORDER — PHENYLEPHRINE HCL 10 MG/ML IJ SOLN
0.0000 ug/min | INTRAVENOUS | Status: DC
  Filled 2013-04-01: qty 4

## 2013-04-01 MED ORDER — INSULIN ASPART 100 UNIT/ML ~~LOC~~ SOLN
0.0000 [IU] | SUBCUTANEOUS | Status: DC
  Administered 2013-04-01 (×2): 4 [IU] via SUBCUTANEOUS
  Administered 2013-04-01 – 2013-04-03 (×4): 2 [IU] via SUBCUTANEOUS

## 2013-04-01 MED ORDER — POTASSIUM CHLORIDE 10 MEQ/50ML IV SOLN
10.0000 meq | INTRAVENOUS | Status: AC | PRN
  Administered 2013-04-01 (×3): 10 meq via INTRAVENOUS

## 2013-04-01 MED ORDER — DOPAMINE-DEXTROSE 3.2-5 MG/ML-% IV SOLN
2.0000 ug/kg/min | INTRAVENOUS | Status: DC
  Administered 2013-04-02: 3 ug/kg/min via INTRAVENOUS
  Filled 2013-04-01: qty 250

## 2013-04-01 MED ORDER — POTASSIUM CHLORIDE 10 MEQ/50ML IV SOLN
10.0000 meq | INTRAVENOUS | Status: AC
  Administered 2013-04-01 (×2): 10 meq via INTRAVENOUS

## 2013-04-01 MED ORDER — POTASSIUM CHLORIDE 10 MEQ/50ML IV SOLN
INTRAVENOUS | Status: AC
  Administered 2013-04-01: 10 meq via INTRAVENOUS
  Filled 2013-04-01: qty 150

## 2013-04-01 MED ORDER — CHLORHEXIDINE GLUCONATE 0.12 % MT SOLN
OROMUCOSAL | Status: AC
  Administered 2013-04-01: 15 mL
  Filled 2013-04-01: qty 15

## 2013-04-01 MED FILL — Potassium Chloride Inj 2 mEq/ML: INTRAVENOUS | Qty: 40 | Status: AC

## 2013-04-01 MED FILL — Sodium Chloride IV Soln 0.9%: INTRAVENOUS | Qty: 1000 | Status: AC

## 2013-04-01 MED FILL — Heparin Sodium (Porcine) Inj 1000 Unit/ML: INTRAMUSCULAR | Qty: 30 | Status: AC

## 2013-04-01 MED FILL — Magnesium Sulfate Inj 50%: INTRAMUSCULAR | Qty: 10 | Status: AC

## 2013-04-01 NOTE — Progress Notes (Signed)
Patient ID: Kayla Chambers, female   DOB: [birth year], 79 y.o.   MRN: [id number] T[facility] DAILY PROGRESS NOTE                   [address]            [place] [address]          [phone]      1 Day Post-Op Procedure(s) (LRB): ASCENDING AORTIC ROOT REPLACEMENT (N/A) INTRAOPERATIVE TRANSESOPHAGEAL ECHOCARDIOGRAM (N/A) MAZE (N/A)  Total Length of Stay:  LOS: 1 day   Subjective: Awake, neuro intact, follows commands  Objective: Vital signs in last 24 hours: Temp:  [95.4 F (35.2 C)-98.2 F (36.8 C)] 97.7 F (36.5 C) (05/14 0700) Pulse Rate:  [81-90] 81 (05/14 0724) Cardiac Rhythm:  [-] Atrial paced (05/14 0600) Resp:  [0-27] 27 (05/14 0724) BP: (88-116)/(52-67) 88/53 mmHg (05/14 0724) SpO2:  [100 %] 100 % (05/14 0724) Arterial Line BP: (87-126)/(56-76) 87/56 mmHg (05/14 0700) FiO2 (%):  [49.4 %-50.8 %] 50 % (05/14 0724) Weight:  [139 lb 15.9 oz (63.5 kg)] 139 lb 15.9 oz (63.5 kg) (05/14 0500)  Filed Weights   03/30/13 1300 04/01/13 0500  Weight: 118 lb 13.3 oz (53.9 kg) 139 lb 15.9 oz (63.5 kg)    Weight change: 21 lb 2.6 oz (9.6 kg)   Hemodynamic parameters for last 24 hours: PAP: (25-31)/(9-22) 30/20 mmHg CO:  [2.6 L/min-3.3 L/min] 3.3 L/min CI:  [1.7 L/min/m2-2.1 L/min/m2] 2.1 L/min/m2  Intake/Output from previous day: 05/13 0701 - 05/14 0700 In: 10990.1 [I.V.:6062.1; Blood:2588; NG/GT:90; IV Piggyback:2250] Out: 8300 [Urine:5500; Emesis/NG output:50; Blood:2500; Chest Tube:250]  Intake/Output this shift:    Current Meds: Scheduled Meds: . acetaminophen  1,000 mg Oral Q6H   Or  . acetaminophen (TYLENOL) oral liquid 160 mg/5 mL  975 mg Per Tube Q6H  . acidophilus  1 capsule Oral Daily  . aspirin EC  325 mg Oral Daily   Or  . aspirin  324 mg Per Tube Daily  . bisacodyl  10 mg Oral Daily   Or  . bisacodyl  10 mg Rectal Daily  . cefUROXime (ZINACEF)  IV  1.5 g Intravenous Q12H  . docusate sodium  200 mg Oral Daily  . insulin aspart   0-24 Units Subcutaneous Q2H   Followed by  . insulin aspart  0-24 Units Subcutaneous Q4H  . insulin regular  0-10 Units Intravenous TID WC  . levothyroxine  75 mcg Oral Daily  . metoCLOPramide (REGLAN) injection  10 mg Intravenous Q6H  . metoprolol tartrate  12.5 mg Oral BID   Or  . metoprolol tartrate  12.5 mg Per Tube BID  . multivitamin with minerals  1 tablet Oral Daily  . omega-3 acid ethyl esters  1 g Oral Daily  . [START ON 04/02/2013] pantoprazole  40 mg Oral Daily  . sodium chloride  3 mL Intravenous Q12H   Continuous Infusions: . sodium chloride 20 mL/hr at 04/01/13 0400  . sodium chloride 20 mL (03/31/13 2000)  . sodium chloride    . amiodarone (NEXTERONE PREMIX) 360 mg/200 mL dextrose 30 mg/hr (04/01/13 0400)  . dexmedetomidine Stopped (04/01/13 0700)  . insulin (NOVOLIN-R) infusion Stopped (04/01/13 0405)  . lactated ringers 20 mL/hr at 04/01/13 0400  . milrinone 0.3 mcg/kg/min (04/01/13 0400)  . nitroGLYCERIN Stopped (03/31/13 2000)  . phenylephrine (NEO-SYNEPHRINE) Adult infusion 35 mcg/min (04/01/13 0736)   PRN Meds:.albumin human, metoprolol, midazolam, morphine injection, ondansetron (ZOFRAN) IV, oxyCODONE, potassium chloride, sodium  chloride  General appearance: alert, cooperative and mild distress Neurologic: intact Heart: regular rate and rhythm, S1, S2 normal, no murmur, click, rub or gallop Lungs: diminished breath sounds bibasilar Abdomen: soft, non-tender; bowel sounds normal; no masses,  no organomegaly Extremities: extremities normal, atraumatic, no cyanosis or edema and Homans sign is negative, no sign of DVT Wound: sternum stable, mt in place not much drainage  Lab Results: CBC: Recent Labs  03/31/13 1954 04/01/13 0400  WBC 9.5 7.9  HGB 8.9* 8.4*  HCT 24.6* 23.4*  PLT 81* 69*   BMET:  Recent Labs  03/31/13 1940 04/01/13 0400  NA 140 138  K 3.2* 3.2*  CL  --  106  CO2  --  25  GLUCOSE 160* 112*  BUN  --  7  CREATININE  --  0.45*    CALCIUM  --  7.0*    PT/INR:  Recent Labs  03/31/13 1954  LABPROT 18.4*  INR 1.58*   Radiology: Dg Chest Portable 1 View  03/31/2013   *RADIOLOGY REPORT*  Clinical Data: Post-op  PORTABLE CHEST - 1 VIEW  Comparison: 03/26/2013  Findings: Endotracheal tube is in satisfactory position the tip approximately 4.6 cm above the carina.  Nasogastric tube can be followed to the distal esophagus and continues below the edge of the image.  Right IJ sheath transmits a Swan-Ganz catheter with distal tip in the pulmonary outflow tract. A drain/tube projects over the left heart border.  Mediastinal drain projects just to the right of midline and terminates cephalad to the level of the carina.  There is stable cardiomegaly.  The patient is status post median sternotomy for aortic valve replacement and Maze procedure.  There is mild pulmonary vascular congestion.  Patchy bibasilar airspace disease, left greater than right suggest mild pulmonary edema.  Small left pleural effusion is suspected.  Negative for pneumothorax.  IMPRESSION: Head status post median sternotomy. Satisfactory position of support devices. Pulmonary vascular congestion with probable mild pulmonary edema.  Suspect small left pleural effusion.   Original Report Authenticated By: Britta Mccreedy, M.D.     Assessment/Plan: S/P Procedure(s) (LRB): ASCENDING AORTIC ROOT REPLACEMENT (N/A) INTRAOPERATIVE TRANSESOPHAGEAL ECHOCARDIOGRAM (N/A) MAZE (N/A) Continue foley due to strict I&O, patient in ICU and urinary output monitoring See progression orders wean vent as tolerated today Expected Acute  Blood - loss Anemia Thrombocytopenia, avoid heparan for now Hypokalemia replace Renal function stable     Benett Swoyer B 04/01/2013 7:57 AM

## 2013-04-01 NOTE — Progress Notes (Signed)
Dr. Tyrone Sage made aware pt has met all of vent weaning parameters. RT suctioned pt via ETT with moderate amount of green bileous secretions noted. Pt had vomited earlier this morning while MD at bedside. Secretions via ETT appear to be same as OGT secretions. Per MD, suction patient out and okay to extubate.

## 2013-04-01 NOTE — Procedures (Signed)
Extubation Procedure Note  Patient Details:   Name: Kayla Chambers DOB: March 09, 1927 MRN: [id number]   Airway Documentation:     Evaluation  O2 sats: stable throughout and currently acceptable Complications: No apparent complications Patient did tolerate procedure well. Bilateral Breath Sounds:  (coarse) Suctioning: Airway Yes Pt awake and alert, extubated per protocol after call to MD regarding green secretions in ETT. Per Rn, Md said to suction her out and extuabte. Positigve cuff leak, NIF -24, VC 600, IS 750. Pt able to vocalize.  [name] 04/01/2013, 9:45 AM

## 2013-04-01 NOTE — Progress Notes (Signed)
TCTS BRIEF SICU PROGRESS NOTE  1 Day Post-Op  S/P Procedure(s) (LRB): ASCENDING AORTIC ROOT REPLACEMENT (N/A) INTRAOPERATIVE TRANSESOPHAGEAL ECHOCARDIOGRAM (N/A) MAZE (N/A)   Extubated uneventfully this morning Appears weak but breathing comfortably.  No complaints NSR - sinus tach w/ stable BP and low PA pressures on low dose dopamine and milrinone O2 sats 98-99% on 3 L/min UOP adequate Labs okay w/ Hgb stable 8.5  Plan: Mobilize.  Wean dopamine to renal dose.  Purcell Nails 04/01/2013 6:43 PM

## 2013-04-02 ENCOUNTER — Inpatient Hospital Stay (HOSPITAL_COMMUNITY): Payer: Medicare Other

## 2013-04-02 LAB — CBC
HCT: 23.7 % — ABNORMAL LOW (ref 36.0–46.0)
Hemoglobin: 8.5 g/dL — ABNORMAL LOW (ref 12.0–15.0)
MCH: 31.3 pg (ref 26.0–34.0)
MCHC: 35.9 g/dL (ref 30.0–36.0)
MCV: 87.1 fL (ref 78.0–100.0)
Platelets: 58 10*3/uL — ABNORMAL LOW (ref 150–400)
RBC: 2.72 MIL/uL — ABNORMAL LOW (ref 3.87–5.11)
RDW: 15.5 % (ref 11.5–15.5)
WBC: 10.9 10*3/uL — ABNORMAL HIGH (ref 4.0–10.5)

## 2013-04-02 LAB — BASIC METABOLIC PANEL
BUN: 10 mg/dL (ref 6–23)
CO2: 24 mEq/L (ref 19–32)
Calcium: 8.4 mg/dL (ref 8.4–10.5)
Chloride: 103 mEq/L (ref 96–112)
Creatinine, Ser: 0.49 mg/dL — ABNORMAL LOW (ref 0.50–1.10)
GFR calc Af Amer: >90 mL/min (ref >90)
GFR calc non Af Amer: 88 mL/min — ABNORMAL LOW (ref >90)
Glucose, Bld: 144 mg/dL — ABNORMAL HIGH (ref 70–99)
Potassium: 3.9 mEq/L (ref 3.5–5.1)
Sodium: 136 mEq/L (ref 135–145)

## 2013-04-02 LAB — GLUCOSE, CAPILLARY
Glucose-Capillary: 106 mg/dL — ABNORMAL HIGH (ref 70–99)
Glucose-Capillary: 110 mg/dL — ABNORMAL HIGH (ref 70–99)
Glucose-Capillary: 114 mg/dL — ABNORMAL HIGH (ref 70–99)
Glucose-Capillary: 116 mg/dL — ABNORMAL HIGH (ref 70–99)
Glucose-Capillary: 119 mg/dL — ABNORMAL HIGH (ref 70–99)
Glucose-Capillary: 126 mg/dL — ABNORMAL HIGH (ref 70–99)
Glucose-Capillary: 155 mg/dL — ABNORMAL HIGH (ref 70–99)

## 2013-04-02 MED ORDER — FUROSEMIDE 10 MG/ML IJ SOLN
40.0000 mg | Freq: Once | INTRAMUSCULAR | Status: AC
  Administered 2013-04-02: 40 mg via INTRAVENOUS

## 2013-04-02 MED ORDER — POTASSIUM CHLORIDE CRYS ER 20 MEQ PO TBCR
40.0000 meq | EXTENDED_RELEASE_TABLET | Freq: Once | ORAL | Status: AC
  Administered 2013-04-02: 40 meq via ORAL
  Filled 2013-04-02: qty 1

## 2013-04-02 MED ORDER — POTASSIUM CHLORIDE CRYS ER 20 MEQ PO TBCR
EXTENDED_RELEASE_TABLET | ORAL | Status: AC
  Filled 2013-04-02: qty 1

## 2013-04-02 MED ORDER — POTASSIUM CHLORIDE CRYS ER 20 MEQ PO TBCR: 20.0000 meq | EXTENDED_RELEASE_TABLET | Freq: Once | ORAL | Status: DC

## 2013-04-02 MED FILL — Heparin Sodium (Porcine) Inj 1000 Unit/ML: INTRAMUSCULAR | Qty: 30 | Status: AC

## 2013-04-02 MED FILL — Lidocaine HCl IV Inj 20 MG/ML: INTRAVENOUS | Qty: 5 | Status: AC

## 2013-04-02 MED FILL — Mannitol IV Soln 20%: INTRAVENOUS | Qty: 1000 | Status: AC

## 2013-04-02 MED FILL — Sodium Chloride IV Soln 0.9%: INTRAVENOUS | Qty: 2000 | Status: AC

## 2013-04-02 MED FILL — Sodium Bicarbonate IV Soln 8.4%: INTRAVENOUS | Qty: 100 | Status: AC

## 2013-04-02 MED FILL — Heparin Sodium (Porcine) Inj 1000 Unit/ML: INTRAMUSCULAR | Qty: 90 | Status: AC

## 2013-04-02 MED FILL — Sodium Chloride Irrigation Soln 0.9%: Qty: 2000 | Status: AC

## 2013-04-02 MED FILL — Electrolyte-R (PH 7.4) Solution: INTRAVENOUS | Qty: 9000 | Status: AC

## 2013-04-02 NOTE — Progress Notes (Addendum)
TCTS DAILY PROGRESS NOTE                   [address]            [place] [address]          [phone]      2 Days Post-Op Procedure(s) (LRB): ASCENDING AORTIC ROOT REPLACEMENT (N/A) INTRAOPERATIVE TRANSESOPHAGEAL ECHOCARDIOGRAM (N/A) MAZE (N/A)  Total Length of Stay:  LOS: 2 days   Subjective: Patient awake, responds to commands.  Objective: Vital signs in last 24 hours: Temp:  [97.3 F (36.3 C)-98.8 F (37.1 C)] 97.3 F (36.3 C) (05/15 0727) Pulse Rate:  [80-108] 81 (05/15 0600) Cardiac Rhythm:  [-] Normal sinus rhythm (05/15 0600) Resp:  [7-26] 26 (05/15 0600) BP: (95-140)/(47-73) 126/67 mmHg (05/15 0600) SpO2:  [94 %-99 %] 98 % (05/15 0600) Arterial Line BP: (90-137)/(49-72) 110/62 mmHg (05/15 0600) FiO2 (%):  [39.9 %-40.7 %] 39.9 % (05/14 0930) Weight:  [63.6 kg (140 lb 3.4 oz)] 63.6 kg (140 lb 3.4 oz) (05/15 0500)  Filed Weights   03/30/13 1300 04/01/13 0500 04/02/13 0500  Weight: 53.9 kg (118 lb 13.3 oz) 63.5 kg (139 lb 15.9 oz) 63.6 kg (140 lb 3.4 oz)    Weight change: 0.1 kg (3.5 oz)   Hemodynamic parameters for last 24 hours: PAP: (21-34)/(8-22) 23/11 mmHg CO:  [3.5 L/min-3.9 L/min] 3.5 L/min CI:  [2.2 L/min/m2-2.5 L/min/m2] 2.3 L/min/m2  Intake/Output from previous day: 05/14 0701 - 05/15 0700 In: 1938.5 [P.O.:180; I.V.:1428.5; NG/GT:30; IV Piggyback:300] Out: 2955 [Urine:2815; Chest Tube:140]  Intake/Output this shift:    Current Meds: Scheduled Meds: . acetaminophen  1,000 mg Oral Q6H   Or  . acetaminophen (TYLENOL) oral liquid 160 mg/5 mL  975 mg Per Tube Q6H  . acidophilus  1 capsule Oral Daily  . aspirin EC  325 mg Oral Daily   Or  . aspirin  324 mg Per Tube Daily  . bisacodyl  10 mg Oral Daily   Or  . bisacodyl  10 mg Rectal Daily  . docusate sodium  200 mg Oral Daily  . insulin aspart  0-24 Units Subcutaneous Q4H  . insulin regular  0-10 Units Intravenous TID WC  . levothyroxine  75 mcg Oral Daily  .  metoprolol tartrate  12.5 mg Oral BID   Or  . metoprolol tartrate  12.5 mg Per Tube BID  . multivitamin with minerals  1 tablet Oral Daily  . omega-3 acid ethyl esters  1 g Oral Daily  . pantoprazole  40 mg Oral Daily  . sodium chloride  3 mL Intravenous Q12H   Continuous Infusions: . sodium chloride Stopped (04/01/13 2200)  . sodium chloride Stopped (04/01/13 2200)  . sodium chloride    . amiodarone (NEXTERONE PREMIX) 360 mg/200 mL dextrose 30 mg/hr (04/02/13 0400)  . dexmedetomidine Stopped (04/01/13 0700)  . DOPamine 3 mcg/kg/min (04/02/13 0400)  . insulin (NOVOLIN-R) infusion Stopped (04/01/13 0405)  . lactated ringers 20 mL/hr at 04/01/13 0400  . milrinone 0.3 mcg/kg/min (04/02/13 0400)  . nitroGLYCERIN Stopped (03/31/13 2000)  . phenylephrine (NEO-SYNEPHRINE) Adult infusion Stopped (04/01/13 1300)   PRN Meds:.metoprolol, midazolam, morphine injection, ondansetron (ZOFRAN) IV, oxyCODONE, sodium chloride  Neurologic: intact Heart: regular rate and rhythm Lungs: Diminished at bases Abdomen: soft, non-tender; bowel sounds normal; no masses,  no organomegaly Extremities: Mild LE edema Wound: Dressings are clean and dry  Lab Results: CBC: Recent Labs  04/01/13 1610 04/02/13 0400  WBC 10.9* 10.9*  HGB 8.9*  8.5* 8.5*  HCT 25.1*  25.0* 23.7*  PLT 60* 58*   BMET:  Recent Labs  04/01/13 0400 04/01/13 1610 04/02/13 0400  NA 138 138 136  K 3.2* 3.4* 3.9  CL 106 103 103  CO2 25  --  24  GLUCOSE 112* 166* 144*  BUN 7 5* 10  CREATININE 0.45* 0.50  0.60 0.49*  CALCIUM 7.0*  --  8.4    PT/INR:  Recent Labs  03/31/13 1954  LABPROT 18.4*  INR 1.58*   Radiology: Dg Chest Portable 1 View In Am  04/01/2013   *RADIOLOGY REPORT*  Clinical Data: Postop for median sternotomy.  PORTABLE CHEST - 1 VIEW  Comparison: 1 day prior  Findings: Endotracheal tube appropriate position.  Nasogastric extends beyond the  inferior aspect of the film.  Right IJ Swan- Ganz catheter  terminates at the pulmonary outflow tract.  Left atrial occlusion device. Prior median sternotomy.  Patient rotated minimally to the right.  Mediastinal drain remains in place.  Mild cardiomegaly.  Increasing small left and developing small right pleural effusion. No pneumothorax.  Persistent mild pulmonary venous congestion.  Similar bibasilar airspace disease.  IMPRESSION: Increased small left and developing small right pleural effusion.  Otherwise, similar appearance of pulmonary venous congestion and bibasilar atelectasis.   Original Report Authenticated By: Jeronimo Greaves, M.D.   Dg Chest Portable 1 View  03/31/2013   *RADIOLOGY REPORT*  Clinical Data: Post-op  PORTABLE CHEST - 1 VIEW  Comparison: 03/26/2013  Findings: Endotracheal tube is in satisfactory position the tip approximately 4.6 cm above the carina.  Nasogastric tube can be followed to the distal esophagus and continues below the edge of the image.  Right IJ sheath transmits a Swan-Ganz catheter with distal tip in the pulmonary outflow tract. A drain/tube projects over the left heart border.  Mediastinal drain projects just to the right of midline and terminates cephalad to the level of the carina.  There is stable cardiomegaly.  The patient is status post median sternotomy for aortic valve replacement and Maze procedure.  There is mild pulmonary vascular congestion.  Patchy bibasilar airspace disease, left greater than right suggest mild pulmonary edema.  Small left pleural effusion is suspected.  Negative for pneumothorax.  IMPRESSION: Head status post median sternotomy. Satisfactory position of support devices. Pulmonary vascular congestion with probable mild pulmonary edema.  Suspect small left pleural effusion.   Original Report Authenticated By: Britta Mccreedy, M.D.     Assessment/Plan: S/P Procedure(s) (LRB): ASCENDING AORTIC ROOT REPLACEMENT (N/A) INTRAOPERATIVE TRANSESOPHAGEAL ECHOCARDIOGRAM (N/A) MAZE (N/A)  1.CV- SR. On Milrinone,  Amiodarone, and Dopamine drips. Wean Milrinone as tolerates. On Lopressor 12.5 bid. 2.Pulmonary-CXR this am shows small bilateral pleural effusions L>R, no pneumothorax. On 2 liters of oxygen via [place]. Encourage incentive spirometer. 3.Volume Overload- Continue with diuresis 4.ABL anemia- H and H 8.5 and 23.7 5.Thrombocytopenia- Platelets 58,000. Check HIT 6.History of hypothyroidism. Check TSH as is now on Amiodarone. 7.Foley to remain    ZIMMERMAN,DONIELLE M PA-C 04/02/2013 8:16 AM  I have seen and examined Jake Michaelis and agree with the above assessment  and plan.  Delight Ovens MD Beeper [phone] Office [phone] 04/02/2013 10:17 AM

## 2013-04-02 NOTE — Op Note (Signed)
Kayla Chambers, Kayla Chambers NO.:  [id number]  MEDICAL RECORD NO.:  [id number]  LOCATION:  2302                         FACILITY:  [facility]  PHYSICIAN:  Sheliah Plane, MD    DATE OF BIRTH:  02/21/1927  DATE OF PROCEDURE:  03/31/2013 DATE OF DISCHARGE:                              OPERATIVE REPORT   PREOPERATIVE DIAGNOSIS:  Dilated aortic root to 6.2 cm and moderate-to- severe aortic insufficiency.  POSTOPERATIVE DIAGNOSIS:  Dilated aortic root to 6.2 cm and moderate-to- severe aortic insufficiency.  PROCEDURE PERFORMED:  Bentall procedure with replacement of aortic root with a 28-mm Valsalva graft with a biologic 25-mm Magna Ease valve, pericardial tissue valve, model 300TFX, serial number [id number] with reimplantation of the right and left coronary ostium.  Left sided MAZE procedure and placement of left atrial clip and coronary artery bypass grafting x1 with reverse saphenous vein graft.  SURGEON:  Sheliah Plane, MD  FIRST ASSISTANT:  Evelene Croon, M.D.  SECOND ASSISTANT:  Doree Fudge, PA  BRIEF HISTORY:  The patient is an 79 year old female with known aortic insufficiency and dilated ascending aorta.  In March of this year, she presented with increasing shortness of breath and was found to be in rapid atrial fibrillation.  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  Echocardiogram and cardiac catheterization were performed.  During the hospital admission, the patient had luminal coronary irregularities, but no significant blockage.  She had moderate-to-severe aortic insufficiency and a severely dilated aortic root.  Replacement of her ascending aorta and aortic root was discussed with the patient and her husband, and because of its size with estimated 20% per year risk of significant  problems after several visits to the office and the patient considered her options, she decided to proceed with surgery.  Risks and options were discussed with her in detail.  She agreed and signed informed consent.  DESCRIPTION OF PROCEDURE:  With Swan-Ganz and arterial line monitors were placed, the patient underwent general endotracheal anesthesia without incident.  A transesophageal echo was placed by Dr. Gypsy Balsam confirming the moderate-to-severe aortic insufficiency, relatively well- preserved LV function with some mild dilatation of the ventricle.  The patient was prepped and draped in usual sterile manner.  Appropriate time-out was performed.  Skin of chest and legs was prepped with Betadine and draped in the usual sterile manner.  Median sternotomy was performed.  Pericardium was opened.  The aorta and the ascending aorta were carefully inspected.  At the level of the innominate artery takeoff, the vessel was just under 4 cm.  We decided to proceed with and there was sufficient room before the marked dilatation to place across that.  We proceeded with a systemic heparinization, cannulating the ascending aorta, and the vicinity of the takeoff of the innominate artery.  A dual-stage venous cannula was placed.  Retrograde catheter was placed.  With the ACT at appropriate levels, the patient was placed on cardiopulmonary bypass at 2.3 liters/minute/meter squared.  Right superior pulmonary vein vent was placed.  Aortic crossclamp was  then applied and 800 mL of retrograde cold blood cardioplegia was administered with diastolic arrest of the heart.  Initially proceeded with left-sided MAZE procedure using the bipolar Medtronic radiofrequency ablation device placing lesions across the base of the left atrial appendage and the pulmonary veins bilaterally.  The right superior pulmonary vein vent was removed temporarily to perform this and then replaced.  A 40-mm atrial clip was applied to  the atrial appendage. We then turned our attention to the aorta.  The aorta was transected in the midportion at the area where it has started dilating the most.  This gave good exposure of the aortic root.  The aortic valve was a trileaflet aortic valve with marked central area that failed to coapt the coronary buttons.  The valve was excised.  The aortic root was excised preserving the coronary left and right coronary buttons.  The native right coronary artery was somewhat tortuous through its course, but both buttons were freed up with good mobility.  The annulus was sized for 25-mm graft.  A Valsalva 28-mm graft was selected.  The #2 Tycron pledgeted sutures were placed circumferentially through the annulus with the pledgets on the outside the graft.  The graft and the valve were seated well and was secured in place.  Cautery was used to open the graft posteriorly for the left coronary button, which was then anastomosed with a running 6-0 Prolene.  The graft was trimmed to the appropriate length and using felt strips and a running 3-0 Prolene, the distal aortic anastomosis was completed.  We then turned our attention to the right coronary button, which was mobilizing the opening and the graft was created and the right coronary button was anastomosed to the graft with a 6-0 Prolene and aortic root vent was then placed in the graft.  The heart was allowed to passively fill and de-air. Intermittently while on bypass, cold blood cardioplegia was administered.  Before removing the crossclamp, warm retrograde cardioplegia was administered.  The aortic crossclamp was then removed with a total crossclamp time of 191 minutes.  The patient required electrodefibrillation and returned to a sinus rhythm with frequent PACs. Ultimately, atrial and ventricular pacing wires were applied.  She had been maintained on low-dose renal dopamine while on pump, was loaded with milrinone and with body  temperature rewarmed to 37 degrees, was ventilated.  The right superior pulmonary vein vent was removed.  TEE showed good ventricular function and valve function.  She was then weaned from cardiopulmonary bypass without difficulty.  She continued to have frequent PACs.  No ST elevation changes and the TEE showed good LV function.  She was decannulated in usual fashion.  Protamine sulfate was administered with the operative field hemostatic.  We loosely closed the pericardium.  Two Blake mediastinal drains were left in place and the sternum was reclosed.  Before complete closure of the incision without changes in EKG or ST elevations, she developed ventricular fibrillation. The chest was reopened.  She was quickly defibrillated with the internal paddles and again returned to a stable pressure.  We carefully looked at the right button and at the TEE, there was no evidence of wall motion abnormalities to suggest ischemic event and after period of observation and stability, we again tried to close the chest and with the chest closed, the patient again defibrillated.  She was cardioverted successfully.  After the second attempt, we decided to place a right vein graft to the right coronary artery.  With the hypothesis  as the chest closed and the right ventricle filled, it was kinking the right coronary ostium.  The patient was re-heparinized.  The ascending aorta was cannulated.  Dual-stage venous cannula was passed.  Using an open technique, a short segment of vein was harvested from the right thigh. A small incision was made at the ankle on the vein as the ankle on the right leg was too small for use.  With the patient on bypass, but without re-crossclamp, the right coronary artery just past the acute margin of the heart was isolated, was a good quality vessel.  Bulldogs were placed proximally and distally and the vessel was opened.  There was proximal flow in the vessel.  We sewed the graft  along with a running 7-0 Prolene.  A peg clamp was placed on the graft of the ascending aorta and a proximal anastomosis was performed.  The graft was de-aired and blood flow was restored down the vein graft.  This appeared satisfactory without any kinks.  The patient was then re-ventilated and weaned from cardiopulmonary bypass without difficulty, and with this attempt, she remained stable without further arrhythmias.  She did require atrial pacing to increase her atrial rate.  She was decannulated in usual fashion.  Protamine sulfate was administered with the operative field, hemostatic and after administering the protamine, platelets and fresh frozen, we again attempted to close the chest.  The TEE showed good LV function.  With this attempt, the patient remained stable and we were able to close the chest without any further arrhythmias. Pericardium was loosely reapproximated over the ascending aorta. Sternum was closed with #6 stainless steel wire, fascia was closed with interrupted 0 Vicryl, running 3-0 Vicryl in the subcutaneous tissue, 4-0 subcuticular stitch in skin edges.  Dry dressings were applied.  The right leg was closed with a running 2-0 Vicryl in the subcutaneous tissue and a 4-0 PDS in the subcuticular tissue.  Total crossclamp time was 191 minutes.  Total pump time was 289.  The patient did require two units of packed red blood cells during the procedure.  Sponge and needle count were reported as correct at completion of the procedure.     Sheliah Plane, MD     EG/MEDQ  D:  04/01/2013  T:  04/02/2013  Job:  [id number]

## 2013-04-02 NOTE — Progress Notes (Signed)
Patient ID: Kayla Chambers, female   DOB: 1927/04/25, 79 y.o.   MRN: [id number]   SICU Evening Rounds:  Hemodynamically stable. A-fib 100-120. On dop 3.  Mentally sluggish but nonfocal.  Breathing comfortably with sats 95%.  Good diuresis.  CBC    Component Value Date/Time   WBC 10.9* 04/02/2013 0400   RBC 2.72* 04/02/2013 0400   HGB 8.5* 04/02/2013 0400   HCT 23.7* 04/02/2013 0400   PLT 58* 04/02/2013 0400   MCV 87.1 04/02/2013 0400   MCH 31.3 04/02/2013 0400   MCHC 35.9 04/02/2013 0400   RDW 15.5 04/02/2013 0400   LYMPHSABS 1.7 01/30/2013 1930   MONOABS 0.4 01/30/2013 1930   EOSABS 0.1 01/30/2013 1930   BASOSABS 0.0 01/30/2013 1930    BMET    Component Value Date/Time   NA 136 04/02/2013 0400   K 3.9 04/02/2013 0400   CL 103 04/02/2013 0400   CO2 24 04/02/2013 0400   GLUCOSE 144* 04/02/2013 0400   BUN 10 04/02/2013 0400   CREATININE 0.49* 04/02/2013 0400   CALCIUM 8.4 04/02/2013 0400   GFRNONAA 88* 04/02/2013 0400   GFRAA >90 04/02/2013 0400    She is most likely still sedated from anesthetic drugs at 79 yo. Continue supportive care.

## 2013-04-02 NOTE — Op Note (Signed)
NAMESAMYAH, BILBO NO.:  [id number]  MEDICAL RECORD NO.:  [id number]  LOCATION:  2302                         FACILITY:  [facility]  PHYSICIAN:  Bedelia Person, M.D.        DATE OF BIRTH:  [birth year]  DATE OF PROCEDURE:  03/31/2013 DATE OF DISCHARGE:                              OPERATIVE REPORT   [name] in an elderly female with a long history of a known dilated aortic root, scheduled at this time for aortic root resection and replacement as well as replacement of the aortic valve and maze procedure due to her chronic atrial fibrillation.  The patient has no history of esophageal or gastric pathology.  After induction of general anesthesia, the airway was secured with an oral endotracheal tube.  An oral gastric tube was placed, suctioned, and then removed.  A 3D transesophageal transducer was heavily lubricated and placed blindly down the oropharynx with no significant resistance. It remained throughout the prolonged intraoperative procedure in the neutral and flexed position including bypass at maximum depth of 45 cm. At the completion of the procedure, the probe was removed.  There was no evidence of oropharyngeal damage.  The prebypass examination revealed the left ventricle to be moderately dilated with mild left ventricular hypertrophy.  Wall motion was normal with no segmental defects.  Overall contractility was good.  The aortic valve had 3 leaflets.  There was no calcifications or vegetations noted. All leaflets were opening and closing appropriately, but the leaflets did not coapt completely.  Color Doppler revealed severe-to-severe insufficiency.  The aorta as previously mentioned showed a significant root dilatation at the sinuses of Valsalva.  The measurement was 61 mm, this rapidly decreased to approximately 42 mm and 2-3 cm cephalad.  The mitral valve was normal appearance.  Again, no masses or vegetations. Valve appeared to be coapting the  valvular plane.  Color Doppler revealed a trace central insufficiency flow.  Left atrium was normal in size and appearance.  A thorough examination of the appendage revealed it to be clean.  Interatrial septum was intact on color examination. Tricuspid valve showed normal appearance.  Color did reveal a moderate central tricuspid regurgitation.  Swan-Ganz catheter was noted.  Right ventricle contractility appeared good.  Right atrium was moderately dilated.  The patient was placed on cardiopulmonary bypass and underwent resection of the dilated aortic root segment as well as replacement of the aortic valve and reimplantation of right coronary artery.  At the completion of the bypass, numerous air bubbles were noted which were cleared with a left ventricular vent.  The post bypass examination; the prosthetic aortic valve had been replaced with a tissue valve with 3 leaflets all opening and closing appropriately.  No perivalvular leaks were detected.  No aortic insufficiency was noted.  The aorta, although now very difficult to visualize, no longer showed the massive dilation that was seen prior to the resection.  There appeared to be no leaks noted.  Left ventricle contractility continued to be dynamic on inotropic dopamine support and milrinone.  There was no regional segmental defects.  The mitral valve was unchanged from the prebypass examination continuing to show just a trace central  mitral insufficiency.  Tricuspid regurgitation continued to be moderate and right heart examination continued to show good right ventricular contractility and although again difficult to visualize, it appeared to be moderately dilated right atrium.  There were no other new findings in the post bypass examination.          ______________________________ Bedelia Person, M.D.     LK/MEDQ  D:  04/01/2013  T:  04/02/2013  Job:  [id number]

## 2013-04-03 ENCOUNTER — Inpatient Hospital Stay (HOSPITAL_COMMUNITY): Payer: Medicare Other

## 2013-04-03 ENCOUNTER — Encounter (HOSPITAL_COMMUNITY): Payer: Self-pay | Admitting: Cardiothoracic Surgery

## 2013-04-03 LAB — TSH: TSH: 0.908 u[IU]/mL (ref 0.350–4.500)

## 2013-04-03 LAB — CBC
HCT: 23.4 % — ABNORMAL LOW (ref 36.0–46.0)
Hemoglobin: 8.2 g/dL — ABNORMAL LOW (ref 12.0–15.0)
MCH: 31.3 pg (ref 26.0–34.0)
MCHC: 35 g/dL (ref 30.0–36.0)
MCV: 89.3 fL (ref 78.0–100.0)
Platelets: 74 10*3/uL — ABNORMAL LOW (ref 150–400)
RBC: 2.62 MIL/uL — ABNORMAL LOW (ref 3.87–5.11)
RDW: 15.1 % (ref 11.5–15.5)
WBC: 9.5 10*3/uL (ref 4.0–10.5)

## 2013-04-03 LAB — COMPREHENSIVE METABOLIC PANEL
ALT: 18 U/L (ref 0–35)
AST: 35 U/L (ref 0–37)
Albumin: 2.6 g/dL — ABNORMAL LOW (ref 3.5–5.2)
Alkaline Phosphatase: 57 U/L (ref 39–117)
BUN: 14 mg/dL (ref 6–23)
CO2: 28 mEq/L (ref 19–32)
Calcium: 8.3 mg/dL — ABNORMAL LOW (ref 8.4–10.5)
Chloride: 94 mEq/L — ABNORMAL LOW (ref 96–112)
Creatinine, Ser: 0.39 mg/dL — ABNORMAL LOW (ref 0.50–1.10)
GFR calc Af Amer: >90 mL/min (ref >90)
GFR calc non Af Amer: >90 mL/min (ref >90)
Glucose, Bld: 215 mg/dL — ABNORMAL HIGH (ref 70–99)
Potassium: 3.7 mEq/L (ref 3.5–5.1)
Sodium: 133 mEq/L — ABNORMAL LOW (ref 135–145)
Total Bilirubin: 0.3 mg/dL (ref 0.3–1.2)
Total Protein: 5.2 g/dL — ABNORMAL LOW (ref 6.0–8.3)

## 2013-04-03 LAB — GLUCOSE, CAPILLARY
Glucose-Capillary: 116 mg/dL — ABNORMAL HIGH (ref 70–99)
Glucose-Capillary: 114 mg/dL — ABNORMAL HIGH (ref 70–99)
Glucose-Capillary: 118 mg/dL — ABNORMAL HIGH (ref 70–99)
Glucose-Capillary: 125 mg/dL — ABNORMAL HIGH (ref 70–99)
Glucose-Capillary: 95 mg/dL (ref 70–99)

## 2013-04-03 LAB — POCT I-STAT, CHEM 8
BUN: 14 mg/dL (ref 6–23)
Calcium, Ion: 1.18 mmol/L (ref 1.13–1.30)
Chloride: 95 mEq/L — ABNORMAL LOW (ref 96–112)
Creatinine, Ser: 0.5 mg/dL (ref 0.50–1.10)
Glucose, Bld: 127 mg/dL — ABNORMAL HIGH (ref 70–99)
HCT: 24 % — ABNORMAL LOW (ref 36.0–46.0)
Hemoglobin: 8.2 g/dL — ABNORMAL LOW (ref 12.0–15.0)
Potassium: 3.4 mEq/L — ABNORMAL LOW (ref 3.5–5.1)
Sodium: 136 mEq/L (ref 135–145)
TCO2: 32 mmol/L (ref 0–100)

## 2013-04-03 MED ORDER — AMIODARONE LOAD VIA INFUSION
150.0000 mg | Freq: Once | INTRAVENOUS | Status: AC
  Administered 2013-04-03: 150 mg via INTRAVENOUS

## 2013-04-03 MED ORDER — PANTOPRAZOLE SODIUM 40 MG IV SOLR
40.0000 mg | INTRAVENOUS | Status: DC
  Administered 2013-04-03 – 2013-04-05 (×3): 40 mg via INTRAVENOUS
  Filled 2013-04-03 (×6): qty 40

## 2013-04-03 MED ORDER — FUROSEMIDE 10 MG/ML IJ SOLN
40.0000 mg | Freq: Once | INTRAMUSCULAR | Status: AC
  Administered 2013-04-03: 40 mg via INTRAVENOUS
  Filled 2013-04-03: qty 4

## 2013-04-03 MED ORDER — LEVOTHYROXINE SODIUM 100 MCG IV SOLR
37.5000 ug | Freq: Every day | INTRAVENOUS | Status: DC
  Administered 2013-04-04: 37.5 ug via INTRAVENOUS
  Filled 2013-04-03 (×3): qty 5

## 2013-04-03 MED ORDER — FUROSEMIDE 10 MG/ML IJ SOLN
40.0000 mg | Freq: Once | INTRAMUSCULAR | Status: AC
  Administered 2013-04-03: 40 mg via INTRAVENOUS

## 2013-04-03 MED ORDER — METOPROLOL TARTRATE 1 MG/ML IV SOLN
2.5000 mg | Freq: Four times a day (QID) | INTRAVENOUS | Status: DC
  Administered 2013-04-03 – 2013-04-04 (×2): 2.5 mg via INTRAVENOUS
  Filled 2013-04-03 (×6): qty 5

## 2013-04-03 MED ORDER — POTASSIUM CHLORIDE 10 MEQ/50ML IV SOLN
INTRAVENOUS | Status: AC
  Administered 2013-04-03: 10 meq
  Filled 2013-04-03: qty 50

## 2013-04-03 MED ORDER — POTASSIUM CHLORIDE 10 MEQ/50ML IV SOLN
10.0000 meq | INTRAVENOUS | Status: AC
  Administered 2013-04-03 (×2): 10 meq via INTRAVENOUS

## 2013-04-03 MED ORDER — POTASSIUM CHLORIDE 10 MEQ/50ML IV SOLN
INTRAVENOUS | Status: AC
  Filled 2013-04-03: qty 100

## 2013-04-03 NOTE — Progress Notes (Signed)
POD # 1 Bentall  Resting comfortably at present  Confused with some visual hallucinations  BP 97/61  Pulse 99  Temp(Src) 97.6 F (36.4 C) (Oral)  Resp 19  Wt 137 lb (62.143 kg)  BMI 24.27 kg/m2  SpO2 100%   Intake/Output Summary (Last 24 hours) at 04/03/13 1808 Last data filed at 04/03/13 1700  Gross per 24 hour  Intake 1376.3 ml  Output   1980 ml  Net -603.7 ml   PM labs pending

## 2013-04-03 NOTE — Progress Notes (Signed)
Pt UOP has declined this afternoon. Lasix 40 mg at 18:40 given without significant response. Pt attending aware and on call MD aware about low output frequently < than 20 mL/hr and on call MD expects pt output to remain low overnight. Will continue to monitor.

## 2013-04-03 NOTE — Progress Notes (Signed)
Patient ID: Kayla Chambers, female   DOB: 1927/03/21, 79 y.o.   MRN: [id number] TCTS DAILY PROGRESS NOTE                   [address]            [place] [address]          [phone]      3 Days Post-Op Procedure(s) (LRB): ASCENDING AORTIC ROOT REPLACEMENT (N/A) INTRAOPERATIVE TRANSESOPHAGEAL ECHOCARDIOGRAM (N/A) MAZE (N/A)  Total Length of Stay:  LOS: 3 days   Subjective: Much more awake and alert this am, wants something to eat  Objective: Vital signs in last 24 hours: Temp:  [97.6 F (36.4 C)-98.8 F (37.1 C)] 97.7 F (36.5 C) (05/16 0000) Pulse Rate:  [70-119] 113 (05/16 0500) Cardiac Rhythm:  [-] Atrial fibrillation (05/16 0600) Resp:  [15-24] 16 (05/16 0700) BP: (94-144)/(55-90) 118/73 mmHg (05/16 0700) SpO2:  [91 %-100 %] 97 % (05/16 0500) Arterial Line BP: (127-148)/(67-78) 127/67 mmHg (05/15 1153) Weight:  [137 lb (62.143 kg)] 137 lb (62.143 kg) (05/16 0500)  Filed Weights   04/01/13 0500 04/02/13 0500 04/03/13 0500  Weight: 139 lb 15.9 oz (63.5 kg) 140 lb 3.4 oz (63.6 kg) 137 lb (62.143 kg)    Weight change: -3 lb 3.4 oz (-1.457 kg)   Hemodynamic parameters for last 24 hours:    Intake/Output from previous day: 05/15 0701 - 05/16 0700 In: 1539.9 [P.O.:270; I.V.:1219.9; IV Piggyback:50] Out: 2535 [Urine:2535]  Intake/Output this shift:    Current Meds: Scheduled Meds: . acetaminophen  1,000 mg Oral Q6H   Or  . acetaminophen (TYLENOL) oral liquid 160 mg/5 mL  975 mg Per Tube Q6H  . acidophilus  1 capsule Oral Daily  . aspirin EC  325 mg Oral Daily   Or  . aspirin  324 mg Per Tube Daily  . bisacodyl  10 mg Oral Daily   Or  . bisacodyl  10 mg Rectal Daily  . docusate sodium  200 mg Oral Daily  . insulin aspart  0-24 Units Subcutaneous Q4H  . insulin regular  0-10 Units Intravenous TID WC  . levothyroxine  75 mcg Oral Daily  . metoprolol tartrate  12.5 mg Oral BID   Or  . metoprolol tartrate  12.5 mg Per Tube BID  .  multivitamin with minerals  1 tablet Oral Daily  . omega-3 acid ethyl esters  1 g Oral Daily  . pantoprazole  40 mg Oral Daily  . potassium chloride      . sodium chloride  3 mL Intravenous Q12H   Continuous Infusions: . sodium chloride Stopped (04/01/13 2200)  . sodium chloride Stopped (04/01/13 2200)  . sodium chloride    . amiodarone (NEXTERONE PREMIX) 360 mg/200 mL dextrose 30 mg/hr (04/03/13 0700)  . dexmedetomidine Stopped (04/01/13 0700)  . DOPamine 3 mcg/kg/min (04/03/13 0700)  . insulin (NOVOLIN-R) infusion Stopped (04/01/13 0405)  . lactated ringers 20 mL/hr at 04/01/13 0400  . milrinone 0.1 mcg/kg/min (04/02/13 1000)  . nitroGLYCERIN Stopped (03/31/13 2000)  . phenylephrine (NEO-SYNEPHRINE) Adult infusion Stopped (04/01/13 1300)   PRN Meds:.metoprolol, midazolam, morphine injection, ondansetron (ZOFRAN) IV, oxyCODONE, sodium chloride  General appearance: alert, cooperative and no distress Neurologic: intact Heart: regular rate and rhythm, S1, S2 normal, no murmur, click, rub or gallop and normal apical impulse Lungs: diminished breath sounds bibasilar Abdomen: soft, non-tender; bowel sounds normal; no masses,  no organomegaly Extremities: edema mild in legs left more then  right and Homans sign is negative, no sign of DVT Wound: sternum stable  Lab Results: CBC: Recent Labs  04/01/13 1610 04/02/13 0400 04/03/13 0721  WBC 10.9* 10.9*  --   HGB 8.9*  8.5* 8.5* 8.2*  HCT 25.1*  25.0* 23.7* 24.0*  PLT 60* 58*  --    BMET:  Recent Labs  04/01/13 0400  04/02/13 0400 04/03/13 0721  NA 138  < > 136 136  K 3.2*  < > 3.9 3.4*  CL 106  < > 103 95*  CO2 25  --  24  --   GLUCOSE 112*  < > 144* 127*  BUN 7  < > 10 14  CREATININE 0.45*  < > 0.49* 0.50  CALCIUM 7.0*  --  8.4  --   < > = values in this interval not displayed.  PT/INR:  Recent Labs  03/31/13 1954  LABPROT 18.4*  INR 1.58*   Radiology: Dg Chest Port 1 View  04/02/2013   *RADIOLOGY REPORT*   Clinical Data: Postop, chest tube.  PORTABLE CHEST - 1 VIEW  Comparison: 04/01/2013.  Findings: Interval extubation. Nasogastric tube, right IJ Swan-Ganz catheter, mediastinal drain and left chest tube have been removed in the interval.  Right IJ catheter sheath remains in place. Sternotomy wires are unchanged in position.  Cardiomediastinal silhouette is unchanged in size and contour. Biapical pleural thickening.  Small bilateral pleural effusions with bibasilar air space disease, left greater than right.  No pneumothorax.  IMPRESSION: Small bilateral pleural effusions and bibasilar air space disease, left greater than right.   Original Report Authenticated By: Leanna Battles, M.D.     Assessment/Plan: S/P Procedure(s) (LRB): ASCENDING AORTIC ROOT REPLACEMENT (N/A) INTRAOPERATIVE TRANSESOPHAGEAL ECHOCARDIOGRAM (N/A) MAZE (N/A) Mobilize Diuresis Continue foley due to acute urinary retention, bladder outlet obstruction, strict I&O, patient in ICU and urinary output monitoring Pt to see  Wean off doapmine    Atlas Kuc B 04/03/2013 7:50 AM

## 2013-04-03 NOTE — Evaluation (Signed)
Clinical/Bedside Swallow Evaluation Patient Details  Name: Kayla Chambers MRN: [id number] Date of Birth: 1927-03-07  Today's Date: 04/03/2013 Time: 4098-1191 SLP Time Calculation (min): 13 min  Past Medical History:  Past Medical History  Diagnosis Date  . Hypothyroidism   . 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 Dilated aortic root     Marked dilatation of aortic root  . Aortic insufficiency     Severe, pending possible surgery (awaiting eval for sarcoma)  . Polymyalgia rheumatica   . Vertigo   . Peripheral neuropathy   . Intracranial aneurysm     in late 1970's  . Osteoporosis   . Rheumatic heart disease   . Headache     hx migraines  . Anxiety   . Fibromyalgia   . 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 Neoadjuvant RT by Dr. Abelardo Diesel. Surveillance plan: MRI 12/08/11 shows changes in presumed post-op seroma/hematoma with slight increase in size - follow up MRI 11/18/12 showed same size. Will continue with q3 month MRI. CT C/A/P annually alternating with CXR at 6 months.   . Atrial fibrillation     a. Dx 01/2013.  . Diastolic CHF   . CAD (coronary artery disease)     Moderate coronary calcification, without critical obstruction by cath 02/02/13  . PMR (polymyalgia rheumatica)     Inactive  . Shortness of breath     cannot lie flat, when afib flares  . Dizziness    Past Surgical History:  Past Surgical History  Procedure Laterality Date  . Sarcoma resection  2011  . Abdominal hysterectomy    . Bladder suspension    . Colonoscopy  10/25/2011    Procedure: COLONOSCOPY;  Surgeon: Charolett Bumpers, MD;  Location: [facility];  Service: Endoscopy;  Laterality: N/A;  . Eye surgery Bilateral     cataracts  . Ascending aortic root replacement N/A 03/31/2013    Procedure: ASCENDING AORTIC ROOT REPLACEMENT;  Surgeon: Delight Ovens, MD;  Location:  [facility] OR;  Service: Open Heart Surgery;  Laterality: N/A;  . Intraoperative transesophageal echocardiogram N/A 03/31/2013    Procedure: INTRAOPERATIVE TRANSESOPHAGEAL ECHOCARDIOGRAM;  Surgeon: Delight Ovens, MD;  Location: [facility] OR;  Service: Open Heart Surgery;  Laterality: N/A;  . Maze N/A 03/31/2013    Procedure: MAZE;  Surgeon: Delight Ovens, MD;  Location: [facility] OR;  Service: Open Heart Surgery;  Laterality: N/A;   HPI:  Patient is an 79 year old female followed by Dr. Ronny Flurry. 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 She presented to the office with increasing nocturnal dyspnea orthopnea and rapid heart rate. She was found to be in new onset of atrial fibrillation and was hospitalized. Followup including echocardiogram and cardiac catheterization confirms significant dilatation of the aortic root( 6.2 cm by echo) and moderate to moderately severe aortic insufficiency. Pt underwent an aortic root descending aorta and aortic valve replacement on 04/02/13. Pt with some confusion following procedure, pt wants to eat.   Assessment / Plan / Recommendation Clinical Impression  Pt demosntrates overt evidence of aspiration with trials of thin liquids and purees. At baseline pt with audible standing secretions in airway, pt unable to effectively clear even with max cues and assist. Pt also hoarse indicating possible vocal fold edema and decreased airway portection following brief intubation. There are signs of pharyngeal residue and aspiration  with thins and purees. Recommend pt be NPO for one day with the exception of few ice chips after oral care. SLP will f/u in am tomorrow to determine if diet may be initiated or objective test is warranted.     Aspiration Risk  Severe    Diet Recommendation NPO;Ice chips PRN after oral care        Other  Recommendations Oral Care Recommendations: Oral care QID   Follow Up Recommendations  Inpatient Rehab    Frequency and Duration min 2x/week   2 weeks   Pertinent Vitals/Pain NA    SLP Swallow Goals Goal #3: Pt will consume trials of thin liquids and purees with min verbal cues and assist without overt evidence of aspiration.    Swallow Study Prior Functional Status       General HPI: Patient is an 79 year old female followed by Dr. Ronny Flurry. 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 She presented to the office with increasing nocturnal dyspnea orthopnea and rapid heart rate. She was found to be in new onset of atrial fibrillation and was hospitalized. Followup including echocardiogram and cardiac catheterization confirms significant dilatation of the aortic root( 6.2 cm by echo) and moderate to moderately severe aortic insufficiency. Pt underwent an aortic root descending aorta and aortic valve replacement on 04/02/13. Pt with some confusion following procedure, pt wants to eat. Type of Study: Bedside swallow evaluation Previous Swallow Assessment: none Diet Prior to this Study: Thin liquids Temperature Spikes Noted: No Respiratory Status: Supplemental O2 delivered via (comment) History of Recent Intubation: Yes Length of Intubations (days): 1 days Date extubated: 04/02/13 Behavior/Cognition: Alert;Pleasant mood;Cooperative;Confused Oral Cavity - Dentition: Adequate natural dentition;Poor condition Patient Positioning: Upright in bed Baseline Vocal Quality: Wet;Hoarse;Low vocal intensity Volitional Cough: Wet;Congested Volitional Swallow: Able to elicit    Oral/Motor/Sensory Function Overall Oral Motor/Sensory Function: Appears within functional limits for tasks assessed   Ice Chips Ice chips: Impaired Presentation: Spoon Oral Phase Functional Implications: Prolonged oral transit Pharyngeal Phase Impairments: Wet Vocal Quality   Thin Liquid Thin Liquid: Impaired Presentation: Cup Oral Phase Impairments: Impaired anterior to posterior transit Pharyngeal  Phase Impairments: Suspected delayed  Swallow;Decreased hyoid-laryngeal movement;Multiple swallows;Wet Vocal Quality;Cough - Immediate    Nectar Thick Nectar Thick Liquid: Not tested   Honey Thick Honey Thick Liquid: Not tested   Puree Puree: Impaired Presentation: Spoon Oral Phase Impairments: Impaired anterior to posterior transit;Reduced lingual movement/coordination Oral Phase Functional Implications: Prolonged oral transit Pharyngeal Phase Impairments: Multiple swallows;Suspected delayed Swallow;Decreased hyoid-laryngeal movement;Throat Clearing - Immediate;Wet Vocal Quality   Solid   GO    Solid: Not tested       Bobette Leyh, Riley Nearing 04/03/2013,11:56 AM

## 2013-04-04 ENCOUNTER — Inpatient Hospital Stay (HOSPITAL_COMMUNITY): Payer: Medicare Other

## 2013-04-04 LAB — TYPE AND SCREEN
ABO/RH(D): O POS
Antibody Screen: NEGATIVE
Unit division: 0
Unit division: 0
Unit division: 0
Unit division: 0
Unit division: 0
Unit division: 0

## 2013-04-04 LAB — BASIC METABOLIC PANEL
BUN: 23 mg/dL (ref 6–23)
CO2: 32 mEq/L (ref 19–32)
Calcium: 8.7 mg/dL (ref 8.4–10.5)
Chloride: 98 mEq/L (ref 96–112)
Creatinine, Ser: 0.64 mg/dL (ref 0.50–1.10)
GFR calc Af Amer: >90 mL/min (ref >90)
GFR calc non Af Amer: 80 mL/min — ABNORMAL LOW (ref >90)
Glucose, Bld: 104 mg/dL — ABNORMAL HIGH (ref 70–99)
Potassium: 3.7 mEq/L (ref 3.5–5.1)
Sodium: 135 mEq/L (ref 135–145)

## 2013-04-04 LAB — CBC
HCT: 23.7 % — ABNORMAL LOW (ref 36.0–46.0)
Hemoglobin: 8.2 g/dL — ABNORMAL LOW (ref 12.0–15.0)
MCH: 30.8 pg (ref 26.0–34.0)
MCHC: 34.6 g/dL (ref 30.0–36.0)
MCV: 89.1 fL (ref 78.0–100.0)
Platelets: 94 10*3/uL — ABNORMAL LOW (ref 150–400)
RBC: 2.66 MIL/uL — ABNORMAL LOW (ref 3.87–5.11)
RDW: 15.1 % (ref 11.5–15.5)
WBC: 7.5 10*3/uL (ref 4.0–10.5)

## 2013-04-04 LAB — GLUCOSE, CAPILLARY
Glucose-Capillary: 118 mg/dL — ABNORMAL HIGH (ref 70–99)
Glucose-Capillary: 73 mg/dL (ref 70–99)
Glucose-Capillary: 80 mg/dL (ref 70–99)
Glucose-Capillary: 95 mg/dL (ref 70–99)
Glucose-Capillary: 96 mg/dL (ref 70–99)
Glucose-Capillary: 98 mg/dL (ref 70–99)

## 2013-04-04 MED ORDER — RESOURCE THICKENUP CLEAR PO POWD
ORAL | Status: DC | PRN
  Filled 2013-04-04 (×2): qty 125

## 2013-04-04 MED ORDER — POTASSIUM CHLORIDE 10 MEQ/50ML IV SOLN
10.0000 meq | INTRAVENOUS | Status: AC
  Administered 2013-04-04 (×2): 10 meq via INTRAVENOUS
  Filled 2013-04-04: qty 100

## 2013-04-04 MED ORDER — STARCH (THICKENING) PO POWD
ORAL | Status: DC | PRN
  Filled 2013-04-04 (×2): qty 227

## 2013-04-04 MED ORDER — WARFARIN SODIUM 2.5 MG PO TABS
2.5000 mg | ORAL_TABLET | Freq: Every day | ORAL | Status: DC
  Administered 2013-04-04 – 2013-04-06 (×3): 2.5 mg via ORAL
  Filled 2013-04-04 (×4): qty 1

## 2013-04-04 MED ORDER — METOPROLOL TARTRATE 25 MG PO TABS
25.0000 mg | ORAL_TABLET | Freq: Two times a day (BID) | ORAL | Status: DC
  Administered 2013-04-05 – 2013-04-09 (×9): 25 mg via ORAL
  Filled 2013-04-04 (×14): qty 1

## 2013-04-04 MED ORDER — WARFARIN - PHYSICIAN DOSING INPATIENT
Freq: Every day | Status: DC
  Administered 2013-04-04 – 2013-04-05 (×2)

## 2013-04-04 MED ORDER — POTASSIUM CHLORIDE 10 MEQ/50ML IV SOLN
10.0000 meq | INTRAVENOUS | Status: AC
  Administered 2013-04-04 (×3): 10 meq via INTRAVENOUS
  Filled 2013-04-04: qty 150

## 2013-04-04 MED ORDER — ALPRAZOLAM 0.5 MG PO TABS: 0.5000 mg | ORAL_TABLET | Freq: Every evening | ORAL | Status: DC | PRN

## 2013-04-04 MED ORDER — FUROSEMIDE 10 MG/ML IJ SOLN
40.0000 mg | Freq: Two times a day (BID) | INTRAMUSCULAR | Status: AC
  Administered 2013-04-04 (×2): 40 mg via INTRAVENOUS
  Filled 2013-04-04 (×2): qty 4

## 2013-04-04 NOTE — Progress Notes (Signed)
Speech Language Pathology Dysphagia Treatment Patient Details Name: Kayla Chambers MRN: [id number] DOB: [birth year] Today's Date: 04/04/2013 Time: 4098-1191 SLP Time Calculation (min): 40 min  Assessment / Plan / Recommendation Clinical Impression  Pt. up in chair with continued hoarse vocal quality that seems improved from yesterday's documentation as well as husband report of "much stronger voice today."  She continues with mild confusion as well as mild impulsivity when eating/drinking and mod-max verbal/visual/tactile cues to ensure small sips and pharyngeal clearance prior to next bite/sip.  Likely pentration/aspiration with thin water as evidenced by immediate and delayed throat clears and intermittent wet vocal quality.  Nectar thick juice decreased signs to increased intervals of delayed throat clears and no wet quality during phonation.  Prolonged oral phase with applesauce with min lingual residue and prolonged bolus formation.  Recommend pt. initiate Dys1 texture and nectar thickened liquids, pills whole in applesauce unless large, small sips, no straws and full supervision.  SLP will see pt. Monday for ability to upgrade diet texture and liquids versus need to proceed with FEES.     Diet Recommendation  Initiate / Change Diet: Dysphagia 1 (puree);Nectar-thick liquid    SLP Plan Continue with current plan of care   Pertinent Vitals/Pain none   Swallowing Goals  SLP Swallowing Goals Patient will utilize recommended strategies during swallow to increase swallowing safety with: Minimal cueing Goal #3: Pt will consume trials of thin liquids and purees with min verbal cues and assist without overt evidence of aspiration.  Swallow Study Goal #3 - Progress: Progressing toward goal  General Temperature Spikes Noted: No Respiratory Status: Supplemental O2 delivered via (comment) Behavior/Cognition: Alert;Pleasant mood;Cooperative;Confused Oral Cavity - Dentition: Adequate natural  dentition;Poor condition Patient Positioning: Upright in chair  Oral Cavity - Oral Hygiene Does patient have any of the following "at risk" factors?: Oxygen therapy - cannula, mask, simple oxygen devices;Lips - dry, cracked Brush patient's teeth BID with toothbrush (using toothpaste with fluoride): Yes Patient is AT RISK - Oral Care Protocol followed (see row info): Yes   Dysphagia Treatment Treatment focused on: Upgraded PO texture trials;Patient/family/caregiver education;Facilitation of pharyngeal phase;Facilitation of oral phase;Facilitation of oral preparatory phase Family/Caregiver Educated: spouse Treatment Methods/Modalities: Differential diagnosis;Skilled observation Patient observed directly with PO's: Yes Type of PO's observed: Dysphagia 1 (puree);Thin liquids;Nectar-thick liquids Feeding: Able to feed self;Needs assist;Needs set up Liquids provided via: Cup;No straw Oral Phase Signs & Symptoms: Prolonged bolus formation;Prolonged oral phase (with puree) Pharyngeal Phase Signs & Symptoms: Immediate throat clear;Wet vocal quality;Suspected delayed swallow initiation;Multiple swallows;Delayed throat clear Type of cueing: Verbal;Tactile;Visual Amount of cueing:  (mod-max)   GO     Breck Coons Palak Tercero M.Ed [employer] [phone]  04/04/2013

## 2013-04-04 NOTE — Progress Notes (Signed)
4 Days Post-Op Procedure(s) (LRB): ASCENDING AORTIC ROOT REPLACEMENT (N/A) INTRAOPERATIVE TRANSESOPHAGEAL ECHOCARDIOGRAM (N/A) MAZE (N/A) Subjective: Feels better this AM Was very confused yesterday  Objective: Vital signs in last 24 hours: Temp:  [97.4 F (36.3 C)-97.6 F (36.4 C)] 97.6 F (36.4 C) (05/17 0713) Pulse Rate:  [85-105] 85 (05/17 0600) Cardiac Rhythm:  [-] Atrial fibrillation (05/17 0700) Resp:  [13-23] 22 (05/17 0700) BP: (90-126)/(48-82) 115/81 mmHg (05/17 0700) SpO2:  [92 %-100 %] 100 % (05/17 0700) Weight:  [138 lb 14.2 oz (63 kg)] 138 lb 14.2 oz (63 kg) (05/17 0500)  Hemodynamic parameters for last 24 hours:    Intake/Output from previous day: 05/16 0701 - 05/17 0700 In: 1453.2 [P.O.:120; I.V.:1083.2; IV Piggyback:250] Out: 925 [Urine:925] Intake/Output this shift:    General appearance: alert and cooperative Neurologic: no focal deficit Heart: irregularly irregular rhythm Lungs: diminished breath sounds bibasilar Wound: clean and dry  Lab Results:  Recent Labs  04/03/13 0729 04/04/13 0320  WBC 9.5 7.5  HGB 8.2* 8.2*  HCT 23.4* 23.7*  PLT 74* 94*   BMET:  Recent Labs  04/03/13 0729 04/04/13 0320  NA 133* 135  K 3.7 3.7  CL 94* 98  CO2 28 32  GLUCOSE 215* 104*  BUN 14 23  CREATININE 0.39* 0.64  CALCIUM 8.3* 8.7    PT/INR: No results found for this basename: LABPROT, INR,  in the last 72 hours ABG    Component Value Date/Time   PHART 7.375 04/01/2013 0909   HCO3 22.6 04/01/2013 0909   TCO2 32 04/03/2013 0721   ACIDBASEDEF 3.0* 04/01/2013 0909   O2SAT 96.0 04/01/2013 0909   CBG (last 3)   Recent Labs  04/04/13 0029 04/04/13 0533 04/04/13 0711  GLUCAP 96 80 73    Assessment/Plan: S/P Procedure(s) (LRB): ASCENDING AORTIC ROOT REPLACEMENT (N/A) INTRAOPERATIVE TRANSESOPHAGEAL ECHOCARDIOGRAM (N/A) MAZE (N/A) POD # 4 CV- remains in a fib/ flutter on amiodarone  Change lopressor to PO  Will start coumadin given the  persistent a fib  RESP- pulmonary hygiene  RENAL- creatinine Ok, supplement K, diurese  CBG better controlled  NEURO- delirium improved- monitor   LOS: 4 days    Ahrianna Siglin C 04/04/2013

## 2013-04-04 NOTE — Progress Notes (Signed)
Stable day  Up in chair  BP 101/75  Pulse 102  Temp(Src) 97.4 F (36.3 C) (Oral)  Resp 19  Wt 138 lb 14.2 oz (63 kg)  BMI 24.61 kg/m2  SpO2 98%   Intake/Output Summary (Last 24 hours) at 04/04/13 1936 Last data filed at 04/04/13 1900  Gross per 24 hour  Intake 1590.8 ml  Output   1250 ml  Net  340.8 ml    No PM labs  Confusion much improved

## 2013-04-04 NOTE — Progress Notes (Signed)
Physical Therapy Evaluation Patient Details Name: Kayla Chambers MRN: [id number] DOB: [birth year] Today's Date: 04/04/2013 Time: 4098-1191 PT Time Calculation (min): 22 min  PT Assessment / Plan / Recommendation Clinical Impression  Pt s/p aortic root replacment with decr mobility secondary to decr endurance and decr balance.  Will benefit from PT to address endruance and balance.  Should progress well and go home with HHPT f/u.      PT Assessment  Patient needs continued PT services    Follow Up Recommendations  Home health PT;Supervision/Assistance - 24 hour                Equipment Recommendations  None recommended by PT         Frequency Min 3X/week    Precautions / Restrictions Precautions Precautions: Sternal;Fall   Pertinent Vitals/Pain VSS, some pain      Mobility  Bed Mobility Bed Mobility: Sit to Supine Sit to Supine: 3: Mod assist Details for Bed Mobility Assistance: Assist for LES Transfers Transfers: Sit to Stand;Stand to Sit Sit to Stand: 3: Mod assist;Without upper extremity assist;From chair/3-in-1 Stand to Sit: 3: Mod assist;Without upper extremity assist;To bed Details for Transfer Assistance: Pt needed cues for hand placement and for sternal precautions.  Pt needed steadying assist initially upon standing.   Ambulation/Gait Ambulation/Gait Assistance: 1: +2 Total assist Ambulation/Gait: Patient Percentage: 70% Ambulation Distance (Feet): 120 Feet Assistive device: Other (Comment) (pushing wheelchair) Ambulation/Gait Assistance Details: Pt needed cues for upright posture throughout and to stay close to wheelchair.  Pt needed assist at times to move the wheelchair for steering as well.  Cues for pursed lip breathing as well.   Gait Pattern: Step-through pattern;Decreased stride length;Decreased step length - right;Decreased step length - left;Trunk flexed;Wide base of support Gait velocity: decreased Stairs: No Wheelchair Mobility Wheelchair  Mobility: No         PT Diagnosis: Generalized weakness  PT Problem List: Decreased balance;Decreased mobility;Decreased knowledge of use of DME;Decreased safety awareness;Decreased knowledge of precautions;Pain PT Treatment Interventions: DME instruction;Gait training;Stair training;Functional mobility training;Therapeutic activities;Therapeutic exercise;Balance training;Neuromuscular re-education;Cognitive remediation;Patient/family education   PT Goals Acute Rehab PT Goals PT Goal Formulation: With patient Time For Goal Achievement: 04/18/13 Potential to Achieve Goals: Good Pt will go Supine/Side to Sit: with supervision PT Goal: Supine/Side to Sit - Progress: Goal set today Pt will go Sit to Stand: Independently PT Goal: Sit to Stand - Progress: Goal set today Pt will Ambulate: 51 - 150 feet;with modified independence;with least restrictive assistive device PT Goal: Ambulate - Progress: Goal set today Pt will Go Up / Down Stairs: 6-9 stairs;with supervision;with least restrictive assistive device PT Goal: Up/Down Stairs - Progress: Goal set today  Visit Information  Last PT Received On: 04/04/13 Assistance Needed: +2    Subjective Data  Subjective: "I feel so weak." Patient Stated Goal: To go home   Prior Functioning  Home Living Lives With: Spouse Available Help at Discharge: Family;Available 24 hours/day Type of Home: House Home Access: Stairs to enter [employer] of Steps: 4 Entrance Stairs-Rails: Left Home Layout: Multi-level;Able to live on main level with bedroom/bathroom Alternate Level Stairs-Number of Steps: 10 Alternate Level Stairs-Rails: Right Bathroom Shower/Tub: Engineer, manufacturing systems: Standard Bathroom Accessibility: Yes How Accessible: Accessible via walker Home Adaptive Equipment: Quad cane;Walker - rolling;Straight cane;Bedside commode/3-in-1;Tub transfer bench;Hand-held shower hose Prior Function Level of Independence:  Independent Able to Take Stairs?: Yes Driving: No Vocation: Retired Musician:  (hoarse)    Cognition  Cognition Arousal/Alertness: Psychologist, occupational  During Therapy: WFL for tasks assessed/performed Overall Cognitive Status: Within Functional Limits for tasks assessed    Extremity/Trunk Assessment Right Lower Extremity Assessment RLE ROM/Strength/Tone: WFL for tasks assessed RLE Sensation: History of peripheral neuropathy Left Lower Extremity Assessment LLE ROM/Strength/Tone: WFL for tasks assessed LLE Sensation: History of peripheral neuropathy   Balance Static Standing Balance Static Standing - Balance Support: Bilateral upper extremity supported;During functional activity Static Standing - Level of Assistance: 3: Mod assist Static Standing - Comment/# of Minutes: 2 minutes needing mod assist for steadying with wheelchair  End of Session PT - End of Session Equipment Utilized During Treatment: Gait belt;Oxygen Activity Tolerance: Patient limited by fatigue Patient left: in bed;with call bell/phone within reach;with family/visitor present Nurse Communication: Mobility status       INGOLD,Joal Eakle 04/04/2013, 12:57 PM [facility] Acute Rehabilitation [phone] (pager)

## 2013-04-05 ENCOUNTER — Inpatient Hospital Stay (HOSPITAL_COMMUNITY): Payer: Medicare Other

## 2013-04-05 LAB — GLUCOSE, CAPILLARY
Glucose-Capillary: 110 mg/dL — ABNORMAL HIGH (ref 70–99)
Glucose-Capillary: 116 mg/dL — ABNORMAL HIGH (ref 70–99)
Glucose-Capillary: 118 mg/dL — ABNORMAL HIGH (ref 70–99)
Glucose-Capillary: 82 mg/dL (ref 70–99)
Glucose-Capillary: 98 mg/dL (ref 70–99)

## 2013-04-05 LAB — BASIC METABOLIC PANEL
CO2: 31 mEq/L (ref 19–32)
Chloride: 94 mEq/L — ABNORMAL LOW (ref 96–112)
Creatinine, Ser: 0.66 mg/dL (ref 0.50–1.10)
GFR calc Af Amer: >90 mL/min (ref >90)
Potassium: 3.7 mEq/L (ref 3.5–5.1)
Sodium: 134 mEq/L — ABNORMAL LOW (ref 135–145)

## 2013-04-05 LAB — CBC
HCT: 22.2 % — ABNORMAL LOW (ref 36.0–46.0)
Hemoglobin: 7.7 g/dL — ABNORMAL LOW (ref 12.0–15.0)
MCV: 89.5 fL (ref 78.0–100.0)
RBC: 2.48 MIL/uL — ABNORMAL LOW (ref 3.87–5.11)
WBC: 7.2 10*3/uL (ref 4.0–10.5)

## 2013-04-05 LAB — PROTIME-INR: INR: 1.18 (ref 0.00–1.49)

## 2013-04-05 MED ORDER — LACTASE 3000 UNITS PO TABS
3000.0000 [IU] | ORAL_TABLET | Freq: Three times a day (TID) | ORAL | Status: DC
  Filled 2013-04-05 (×3): qty 1

## 2013-04-05 MED ORDER — LACTASE 3000 UNITS PO TABS
9000.0000 [IU] | ORAL_TABLET | Freq: Three times a day (TID) | ORAL | Status: DC
  Administered 2013-04-05 – 2013-04-08 (×8): 9000 [IU] via ORAL
  Filled 2013-04-05 (×14): qty 3

## 2013-04-05 MED ORDER — POTASSIUM CHLORIDE 10 MEQ/50ML IV SOLN
10.0000 meq | INTRAVENOUS | Status: AC
  Administered 2013-04-05 (×3): 10 meq via INTRAVENOUS

## 2013-04-05 MED ORDER — LACTASE 3000 UNITS PO TABS
9000.0000 [IU] | ORAL_TABLET | Freq: Three times a day (TID) | ORAL | Status: DC
  Filled 2013-04-05: qty 3

## 2013-04-05 MED ORDER — ENSURE PLUS PO LIQD
237.0000 mL | Freq: Three times a day (TID) | ORAL | Status: DC
  Administered 2013-04-05 – 2013-04-10 (×11): 237 mL via ORAL
  Filled 2013-04-05 (×19): qty 237

## 2013-04-05 MED ORDER — INSULIN ASPART 100 UNIT/ML ~~LOC~~ SOLN
0.0000 [IU] | Freq: Three times a day (TID) | SUBCUTANEOUS | Status: DC
  Administered 2013-04-08 – 2013-04-09 (×3): 2 [IU] via SUBCUTANEOUS

## 2013-04-05 MED ORDER — AMIODARONE HCL 200 MG PO TABS
400.0000 mg | ORAL_TABLET | Freq: Two times a day (BID) | ORAL | Status: DC
  Administered 2013-04-05 – 2013-04-06 (×4): 400 mg via ORAL
  Filled 2013-04-05 (×6): qty 2

## 2013-04-05 MED ORDER — ASPIRIN 81 MG PO CHEW
81.0000 mg | CHEWABLE_TABLET | Freq: Every day | ORAL | Status: DC
  Administered 2013-04-05 – 2013-04-10 (×6): 81 mg via ORAL
  Filled 2013-04-05 (×2): qty 1
  Filled 2013-04-05: qty 4
  Filled 2013-04-05 (×3): qty 1

## 2013-04-05 MED ORDER — FUROSEMIDE 10 MG/ML IJ SOLN
40.0000 mg | Freq: Once | INTRAMUSCULAR | Status: AC
  Administered 2013-04-05: 40 mg via INTRAVENOUS
  Filled 2013-04-05: qty 4

## 2013-04-05 MED ORDER — ENOXAPARIN SODIUM 30 MG/0.3ML ~~LOC~~ SOLN
40.0000 mg | SUBCUTANEOUS | Status: DC
  Administered 2013-04-05 – 2013-04-06 (×2): 40 mg via SUBCUTANEOUS
  Filled 2013-04-05 (×3): qty 0.4

## 2013-04-05 MED ORDER — POTASSIUM CHLORIDE CRYS ER 20 MEQ PO TBCR
40.0000 meq | EXTENDED_RELEASE_TABLET | Freq: Two times a day (BID) | ORAL | Status: AC
  Administered 2013-04-05 (×2): 40 meq via ORAL
  Filled 2013-04-05 (×2): qty 2

## 2013-04-05 NOTE — Progress Notes (Signed)
5 Days Post-Op Procedure(s) (LRB): ASCENDING AORTIC ROOT REPLACEMENT (N/A) INTRAOPERATIVE TRANSESOPHAGEAL ECHOCARDIOGRAM (N/A) MAZE (N/A) Subjective: Feels better this Am Approved for dysphagia I diet- tolerating well minimal pain  Objective: Vital signs in last 24 hours: Temp:  [97.4 F (36.3 C)-98.3 F (36.8 C)] 98.3 F (36.8 C) (05/18 0710) Pulse Rate:  [64-163] 89 (05/18 0800) Cardiac Rhythm:  [-] Atrial fibrillation (05/18 0800) Resp:  [17-34] 21 (05/18 0800) BP: (90-155)/(55-93) 90/62 mmHg (05/18 0800) SpO2:  [92 %-100 %] 100 % (05/18 0800) Weight:  [139 lb 8.8 oz (63.3 kg)] 139 lb 8.8 oz (63.3 kg) (05/18 0710)  Hemodynamic parameters for last 24 hours:    Intake/Output from previous day: 05/17 0701 - 05/18 0700 In: 1444.1 [P.O.:540; I.V.:654.1; IV Piggyback:250] Out: 1565 [Urine:1565] Intake/Output this shift: Total I/O In: 50 [IV Piggyback:50] Out: -   General appearance: alert and no distress Neurologic: intact Heart: irregularly irregular rhythm Lungs: diminished breath sounds bibasilar Abdomen: normal findings: soft, non-tender  Lab Results:  Recent Labs  04/04/13 0320 04/05/13 0415  WBC 7.5 7.2  HGB 8.2* 7.7*  HCT 23.7* 22.2*  PLT 94* 120*   BMET:  Recent Labs  04/04/13 0320 04/05/13 0415  NA 135 134*  K 3.7 3.7  CL 98 94*  CO2 32 31  GLUCOSE 104* 100*  BUN 23 25*  CREATININE 0.64 0.66  CALCIUM 8.7 8.7    PT/INR:  Recent Labs  04/05/13 0415  LABPROT 14.8  INR 1.18   ABG    Component Value Date/Time   PHART 7.375 04/01/2013 0909   HCO3 22.6 04/01/2013 0909   TCO2 32 04/03/2013 0721   ACIDBASEDEF 3.0* 04/01/2013 0909   O2SAT 96.0 04/01/2013 0909   CBG (last 3)   Recent Labs  04/04/13 1949 04/04/13 2355 04/05/13 0708  GLUCAP 98 98 82    Assessment/Plan: S/P Procedure(s) (LRB): ASCENDING AORTIC ROOT REPLACEMENT (N/A) INTRAOPERATIVE TRANSESOPHAGEAL ECHOCARDIOGRAM (N/A) MAZE (N/A) - CV- in atrial fibrillation- rate  reasonably well controlled- change amio to PO  Decrease ASA to 81 mg daily  Low dose coumadin  RESP- small bilateral effusions  RENAL- weight still up- continue diuresis, supplement K  ANEMIA- follow- may need transfusion  Deconditioning- ambulate  CBG well controlled- change to AC/HS  Thrombocytopenia- resolving   LOS: 5 days    Kayla Chambers C 04/05/2013

## 2013-04-06 ENCOUNTER — Inpatient Hospital Stay (HOSPITAL_COMMUNITY): Payer: Medicare Other

## 2013-04-06 LAB — BASIC METABOLIC PANEL
BUN: 22 mg/dL (ref 6–23)
Chloride: 96 mEq/L (ref 96–112)
Creatinine, Ser: 0.54 mg/dL (ref 0.50–1.10)
GFR calc Af Amer: >90 mL/min (ref >90)
GFR calc non Af Amer: 85 mL/min — ABNORMAL LOW (ref >90)
Glucose, Bld: 102 mg/dL — ABNORMAL HIGH (ref 70–99)
Potassium: 5.2 mEq/L — ABNORMAL HIGH (ref 3.5–5.1)

## 2013-04-06 LAB — HEPARIN INDUCED THROMBOCYTOPENIA PNL
UFH Low Dose 0.1 IU/mL: 0 % Release
UFH Low Dose 0.5 IU/mL: 0 % Release

## 2013-04-06 LAB — CBC
HCT: 23.4 % — ABNORMAL LOW (ref 36.0–46.0)
Hemoglobin: 7.9 g/dL — ABNORMAL LOW (ref 12.0–15.0)
MCHC: 33.8 g/dL (ref 30.0–36.0)
MCV: 91.1 fL (ref 78.0–100.0)
RDW: 14.5 % (ref 11.5–15.5)

## 2013-04-06 LAB — GLUCOSE, CAPILLARY
Glucose-Capillary: 99 mg/dL (ref 70–99)
Glucose-Capillary: 142 mg/dL — ABNORMAL HIGH (ref 70–99)
Glucose-Capillary: 113 mg/dL — ABNORMAL HIGH (ref 70–99)
Glucose-Capillary: 80 mg/dL (ref 70–99)

## 2013-04-06 MED ORDER — PANTOPRAZOLE SODIUM 40 MG PO TBEC
40.0000 mg | DELAYED_RELEASE_TABLET | Freq: Every day | ORAL | Status: DC
  Administered 2013-04-06 – 2013-04-10 (×4): 40 mg via ORAL
  Filled 2013-04-06 (×5): qty 1

## 2013-04-06 NOTE — Progress Notes (Signed)
Physical Therapy Treatment Patient Details Name: Kayla Chambers MRN: [id number] DOB: December 21, 1926 Today's Date: 04/06/2013 Time: 6213-0865 PT Time Calculation (min): 39 min  PT Assessment / Plan / Recommendation Comments on Treatment Session  Pt s/p aortic root replacement with decr endurance and decr balance.  Confused as well at times.  Will benefit from continued PT to address mobility issues.      Follow Up Recommendations  Home health PT;Supervision/Assistance - 24 hour                 Equipment Recommendations  None recommended by PT        Frequency Min 3X/week   Plan Discharge plan remains appropriate;Frequency remains appropriate    Precautions / Restrictions Precautions Precautions: Sternal;Fall   Pertinent Vitals/Pain VSS, no pain    Mobility  Bed Mobility Bed Mobility: Sit to Supine Sit to Supine: 3: Mod assist Details for Bed Mobility Assistance: Assist for LES Transfers Transfers: Sit to Stand;Stand to Sit Sit to Stand: 3: Mod assist;Without upper extremity assist;From chair/3-in-1 Stand to Sit: 3: Mod assist;Without upper extremity assist;To bed Details for Transfer Assistance: Pt needed cues for hand placement and for sternal precautions.  Pt needed steadying assist initially upon standing.  Assisted pt to 3N1 at end of treatment.   Ambulation/Gait Ambulation/Gait Assistance: 1: +2 Total assist Ambulation/Gait: Patient Percentage: 80% Ambulation Distance (Feet): 280 Feet (needed 1 sitting rest break at 180 feet) Assistive device: Rolling walker Ambulation/Gait Assistance Details: Pt needed cues for upright posture throughout and to stay close to RW.  Pt needed standing rest breaks and 1 sitting rest break.  Pt confused as well.  Needed cues for pursed lip breathing.   Gait Pattern: Step-through pattern;Decreased stride length;Decreased step length - right;Decreased step length - left;Trunk flexed;Wide base of support Gait velocity: decreased Stairs:  No Wheelchair Mobility Wheelchair Mobility: No    PT Goals Acute Rehab PT Goals Pt will go Supine/Side to Sit: with supervision PT Goal: Supine/Side to Sit - Progress: Progressing toward goal Pt will go Sit to Stand: Independently PT Goal: Sit to Stand - Progress: Progressing toward goal Pt will Ambulate: 51 - 150 feet;with modified independence;with least restrictive assistive device PT Goal: Ambulate - Progress: Progressing toward goal  Visit Information  Last PT Received On: 04/06/13 Assistance Needed: +2 (for lines)    Subjective Data  Subjective: "I feel so weak."   Cognition  Cognition Arousal/Alertness: Awake/alert Behavior During Therapy: WFL for tasks assessed/performed Overall Cognitive Status: Within Functional Limits for tasks assessed    Balance  Static Standing Balance Static Standing - Balance Support: Bilateral upper extremity supported;During functional activity Static Standing - Level of Assistance: 4: Min assist Static Standing - Comment/# of Minutes: 2 minutes needing min assist for steadying posture.  End of Session PT - End of Session Equipment Utilized During Treatment: Gait belt;Oxygen Activity Tolerance: Patient limited by fatigue Patient left: with call bell/phone within reach;with family/visitor present;in chair Nurse Communication: Mobility status       INGOLD,Candia Kingsbury 04/06/2013, 5:07 PM  [facility] Acute Rehabilitation [phone] (pager)

## 2013-04-06 NOTE — Progress Notes (Signed)
Patient ID: Kayla Chambers, female   DOB: 1927-11-11, 79 y.o.   MRN: [id number] TCTS DAILY PROGRESS NOTE                   [address]            [place] [address]          [phone]      6 Days Post-Op Procedure(s) (LRB): ASCENDING AORTIC ROOT REPLACEMENT (N/A) INTRAOPERATIVE TRANSESOPHAGEAL ECHOCARDIOGRAM (N/A) MAZE (N/A)  Total Length of Stay:  LOS: 6 days   Subjective: Alert and cooperative   Objective: Vital signs in last 24 hours: Temp:  [97.5 F (36.4 C)-98 F (36.7 C)] 97.6 F (36.4 C) (05/19 0715) Pulse Rate:  [35-115] 90 (05/19 0600) Cardiac Rhythm:  [-] Normal sinus rhythm (05/19 0600) Resp:  [17-24] 23 (05/19 0600) BP: (92-134)/(63-102) 109/83 mmHg (05/19 0600) SpO2:  [93 %-100 %] 96 % (05/19 0600) Weight:  [138 lb 0.1 oz (62.6 kg)] 138 lb 0.1 oz (62.6 kg) (05/19 0600)  Filed Weights   04/04/13 0500 04/05/13 0710 04/06/13 0600  Weight: 138 lb 14.2 oz (63 kg) 139 lb 8.8 oz (63.3 kg) 138 lb 0.1 oz (62.6 kg)    Weight change:    Hemodynamic parameters for last 24 hours:    Intake/Output from previous day: 05/18 0701 - 05/19 0700 In: 883.5 [P.O.:520; I.V.:313.5; IV Piggyback:50] Out: 1590 [Urine:1590]  Intake/Output this shift:    Current Meds: Scheduled Meds: . acidophilus  1 capsule Oral Daily  . amiodarone  400 mg Oral BID  . aspirin  81 mg Oral Daily  . bisacodyl  10 mg Oral Daily   Or  . bisacodyl  10 mg Rectal Daily  . docusate sodium  200 mg Oral Daily  . enoxaparin  40 mg Subcutaneous Q24H  . Ensure Plus  237 mL Oral TID WC  . insulin aspart  0-15 Units Subcutaneous TID WC  . insulin regular  0-10 Units Intravenous TID WC  . lactase  9,000 Units Oral TID WC  . levothyroxine  37.5 mcg Intravenous Daily  . levothyroxine  75 mcg Oral Daily  . metoprolol tartrate  25 mg Oral BID  . multivitamin with minerals  1 tablet Oral Daily  . pantoprazole (PROTONIX) IV  40 mg Intravenous Q24H  . sodium chloride  3 mL  Intravenous Q12H  . warfarin  2.5 mg Oral q1800  . Warfarin - Physician Dosing Inpatient   Does not apply q1800   Continuous Infusions: . sodium chloride 10 mL/hr at 04/05/13 0800  . sodium chloride Stopped (04/01/13 2200)  . sodium chloride    . dexmedetomidine Stopped (04/01/13 0700)  . lactated ringers 20 mL/hr at 04/01/13 0400   PRN Meds:.ALPRAZolam, metoprolol, morphine injection, ondansetron (ZOFRAN) IV, oxyCODONE, RESOURCE THICKENUP CLEAR, sodium chloride  General appearance: alert and cooperative Neurologic: intact Heart: regular rate and rhythm, S1, S2 normal, no murmur, click, rub or gallop Lungs: clear to auscultation bilaterally Abdomen: soft, non-tender; bowel sounds normal; no masses,  no organomegaly Extremities: extremities normal, atraumatic, no cyanosis or edema and Homans sign is negative, no sign of DVT Wound: sternum stable  Lab Results: CBC: Recent Labs  04/05/13 0415 04/06/13 0410  WBC 7.2 6.0  HGB 7.7* 7.9*  HCT 22.2* 23.4*  PLT 120* 162   BMET:  Recent Labs  04/05/13 0415 04/06/13 0410  NA 134* 134*  K 3.7 5.2*  CL 94* 96  CO2 31 33*  GLUCOSE  100* 102*  BUN 25* 22  CREATININE 0.66 0.54  CALCIUM 8.7 8.8    PT/INR:  Recent Labs  04/06/13 0410  LABPROT 18.5*  INR 1.59*   Radiology: Dg Chest Port 1 View  04/06/2013   *RADIOLOGY REPORT*  Clinical Data: Aortic grafting.  PORTABLE CHEST - 1 VIEW  Comparison: 04/05/2013.  Findings: Stable surgical changes related to bypass surgery.  Valve replacement surgery and left atrial closure device.  There are persistent bilateral pleural effusions with overlying atelectasis, left greater than right.  No edema.  IMPRESSION:  Persistent effusions and atelectasis.   Original Report Authenticated By: Rudie Meyer, M.D.   Dg Chest Port 1 View  04/05/2013   *RADIOLOGY REPORT*  Clinical Data: Aortic stenosis  PORTABLE CHEST - 1 VIEW  Comparison: Yesterday  Findings: Mild cardiomegaly.  Bilateral pleural  effusions left greater than right not significantly changed.  Hazy central and basilar atelectasis verses airspace disease.  Normal vascularity. Percutaneous pacemaker wires.  Right internal jugular introducer sheath.  IMPRESSION: Stable pleural effusions.   Original Report Authenticated By: Jolaine Click, M.D.     Assessment/Plan: S/P Procedure(s) (LRB): ASCENDING AORTIC ROOT REPLACEMENT (N/A) INTRAOPERATIVE TRANSESOPHAGEAL ECHOCARDIOGRAM (N/A) MAZE (N/A) Now back in sinus rhythm Not confused anymore Walked 300 feet  Continue coumadin    Yadiel Aubry B 04/06/2013 8:25 AM

## 2013-04-06 NOTE — Progress Notes (Signed)
Speech Language Pathology Dysphagia Treatment Patient Details Name: Kayla Chambers MRN: [id number] DOB: 09-02-1927 Today's Date: 04/06/2013 Time: 7829-5621 SLP Time Calculation (min): 20 min  Assessment / Plan / Recommendation Clinical Impression  Pt seen for check of diet tolerance. Pt observed to have extremely wet vocal quality during meal. Unable to clear even with max cues/assist from pt and husband. Pt may have either significant residuals or pooling secretions. Objective test warranted to verify safety with current diet or even possiblity for diet upgrade if wet vocal quality not related to dysphagia. Suggest MBS at this  point as pt is more alert and able to travel as pt and husband would prefer to avoid endoscopic exam (FEES).     Diet Recommendation  Continue with Current Diet: Dysphagia 1 (puree);Nectar-thick liquid    SLP Plan MBS   Pertinent Vitals/Pain NA   Swallowing Goals  SLP Swallowing Goals Patient will utilize recommended strategies during swallow to increase swallowing safety with: Minimal cueing Swallow Study Goal #2 - Progress: Progressing toward goal  General Temperature Spikes Noted: No Respiratory Status: Supplemental O2 delivered via (comment) Behavior/Cognition: Alert;Pleasant mood;Cooperative Oral Cavity - Dentition: Adequate natural dentition;Poor condition Patient Positioning: Upright in chair  Oral Cavity - Oral Hygiene Does patient have any of the following "at risk" factors?: Oxygen therapy - cannula, mask, simple oxygen devices;Lips - dry, cracked Brush patient's teeth BID with toothbrush (using toothpaste with fluoride): Yes Patient is AT RISK - Oral Care Protocol followed (see row info): Yes   Dysphagia Treatment Treatment focused on: Skilled observation of diet tolerance;Patient/family/caregiver education;Facilitation of pharyngeal phase;Utilization of compensatory strategies Family/Caregiver Educated: spouse Treatment Methods/Modalities:  Differential diagnosis;Skilled observation Patient observed directly with PO's: Yes Type of PO's observed: Dysphagia 1 (puree);Nectar-thick liquids Feeding: Able to feed self Liquids provided via: Cup;No straw Oral Phase Signs & Symptoms: Prolonged bolus formation;Prolonged oral phase Pharyngeal Phase Signs & Symptoms: Wet vocal quality;Suspected delayed swallow initiation;Delayed throat clear Type of cueing: Verbal;Tactile;Visual Amount of cueing: Minimal   GO    Harlon Ditty, MA CCC-SLP [phone]  Claudine Mouton 04/06/2013, 8:56 AM

## 2013-04-06 NOTE — Care Management Note (Signed)
    Page 1 of 2   04/10/2013     3:54:08 PM   CARE MANAGEMENT NOTE 04/10/2013  Patient:  Kayla Chambers, Kayla Chambers   Account Number:  [id number]  Date Initiated:  04/06/2013  Documentation initiated by:  Alvira Philips Assessment:   79 yr-old female adm with dx of dilated aortic root and aortic insufficiency; lives with spouse     Action/Plan:   Anticipated DC Date:  04/10/2013   Anticipated DC Plan:  HOME W HOME HEALTH SERVICES      DC Planning Services  CM consult      [facility] Choice  HOME HEALTH   Choice offered to / List presented to:  C-3 Spouse   DME arranged  HOSPITAL BED        HH arranged  HH-1 RN  HH-2 PT  HH-5 SPEECH THERAPY      HH agency  [facility]   Status of service:  Completed, signed off Medicare Important Message given?   (If response is "NO", the following Medicare IM given date fields will be blank) Date Medicare IM given:   Date Additional Medicare IM given:    Discharge Disposition:  HOME W HOME HEALTH SERVICES  Per UR Regulation:  Reviewed for med. necessity/level of care/duration of stay  If discussed at Long Length of Stay Meetings, dates discussed:   04/09/2013    Comments:  PCP: Dr Deatra James with [facility] @ [facility]  04/10/13 Sherronda Sweigert,RN,BSN [phone] PT FOR DC HOME TODAY.  NOTIFIED [facility]ENT[facility] OF DC TODAY.  04/09/13 Sheralyn Pinegar,RN,BSN [phone] PT FOR DC HOME TOMORROW.  MET WITH PT AND DAUGHTER TO FINALIZE DC PLANS.  HOSP BED TO BE DELIVERED IN AM TO PT'S HOME.  NOTIFIED GENT[facility]A OF DC TOMORROW, WITH REVIEW OF HH CARE NEEDED.  START OF CARE 24-48H POST DC DATE.  CARDIAC REHAB TO GO OVER EDUCATION TOMORROW WITH PT/HUSB AND DAUGHTER TOMORROW WHEN DAUGHTER ARRIVES, LIKELY AROUND LUNCHTIME.  04/06/13 1020 [name] RN MSN BSN CCM PT recommends home therapy, spouse and dtr would also like nurse to assess/monitor recovery from surgery.  Pt's dtr is an OT and will be staying with pt after discharge and spouse  states he has rolling walker, 3-N-1, and tub bench that pt can use.  Pt's bedroom is upstairs and family state they will need hospital bed so pt can stay downstairs while she recovers.  Provided list of [facility] health agencies, referral made per choice.

## 2013-04-06 NOTE — Progress Notes (Signed)
TCTS BRIEF SICU PROGRESS NOTE  6 Days Post-Op  S/P Procedure(s) (LRB): ASCENDING AORTIC ROOT REPLACEMENT (N/A) INTRAOPERATIVE TRANSESOPHAGEAL ECHOCARDIOGRAM (N/A) MAZE (N/A)   Stable day.  Swallowing study performed and now starting dysphagia diet NSR w/ stable BP O2 sats 98-100% on 2 L/min UOP adequate  Plan: Continue current plan.    OWEN,CLARENCE H 04/06/2013 7:56 PM

## 2013-04-06 NOTE — Procedures (Signed)
Objective Swallowing Evaluation: Modified Barium Swallowing Study  Patient Details  Name: Kayla Chambers MRN: [id number] Date of Birth: 02/28/1927  Today's Date: 04/06/2013 Time: 2536-6440 SLP Time Calculation (min): 30 min  Past Medical History:  Past Medical History  Diagnosis Date  . Hypothyroidism   . 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 Dilated aortic root     Marked dilatation of aortic root  . Aortic insufficiency     Severe, pending possible surgery (awaiting eval for sarcoma)  . Polymyalgia rheumatica   . Vertigo   . Peripheral neuropathy   . Intracranial aneurysm     in late 1970's  . Osteoporosis   . Rheumatic heart disease   . Headache     hx migraines  . Anxiety   . Fibromyalgia   . 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 Neoadjuvant RT by Dr. Abelardo Diesel. Surveillance plan: MRI 12/08/11 shows changes in presumed post-op seroma/hematoma with slight increase in size - follow up MRI 11/18/12 showed same size. Will continue with q3 month MRI. CT C/A/P annually alternating with CXR at 6 months.   . Atrial fibrillation     a. Dx 01/2013.  . Diastolic CHF   . CAD (coronary artery disease)     Moderate coronary calcification, without critical obstruction by cath 02/02/13  . PMR (polymyalgia rheumatica)     Inactive  . Shortness of breath     cannot lie flat, when afib flares  . Dizziness    Past Surgical History:  Past Surgical History  Procedure Laterality Date  . Sarcoma resection  2011  . Abdominal hysterectomy    . Bladder suspension    . Colonoscopy  10/25/2011    Procedure: COLONOSCOPY;  Surgeon: Charolett Bumpers, MD;  Location: [facility];  Service: Endoscopy;  Laterality: N/A;  . Eye surgery Bilateral     cataracts  . Ascending aortic root replacement N/A 03/31/2013    Procedure: ASCENDING AORTIC ROOT REPLACEMENT;  Surgeon:  Delight Ovens, MD;  Location: [facility] OR;  Service: Open Heart Surgery;  Laterality: N/A;  . Intraoperative transesophageal echocardiogram N/A 03/31/2013    Procedure: INTRAOPERATIVE TRANSESOPHAGEAL ECHOCARDIOGRAM;  Surgeon: Delight Ovens, MD;  Location: [facility] OR;  Service: Open Heart Surgery;  Laterality: N/A;  . Maze N/A 03/31/2013    Procedure: MAZE;  Surgeon: Delight Ovens, MD;  Location: [facility] OR;  Service: Open Heart Surgery;  Laterality: N/A;   HPI:  Patient is an 79 year old female followed by Dr. Ronny Flurry. 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 She presented to the office with increasing nocturnal dyspnea orthopnea and rapid heart rate. She was found to be in new onset of atrial fibrillation and was hospitalized. Followup including echocardiogram and cardiac catheterization confirms significant dilatation of the aortic root( 6.2 cm by echo) and moderate to moderately severe aortic insufficiency. Pt underwent an aortic root descending aorta and aortic valve replacement on 04/02/13. Pt with some confusion following procedure, pt wants to eat.     Assessment / Plan / Recommendation Clinical Impression  Dysphagia Diagnosis: Moderate pharyngeal phase dysphagia Clinical impression: Pt presents with a moderate pharyngeal dysphagia characterized by a baseline structural component and exacerbated by generalized weakness and suspected pharyngeal secretions. The pt has a curled epiglottis as well as the appearance of  curvature of cervical spine which impedes  epiglottic deflection and laryngeal closure. This is a baseline difference, but now pt also demonstrates a sensory delay in swallow initiation, weakness of hyolaryngeal musculature and base of tongue weakness. These, combined with pts anatomy, result in a decreased airway protection and moderate pharyngeal residuals that are silently penetrated/aspirated post swallow. A chin tuck worsened aspiration in this case. Severity of  penetration is the least with honey thick liqudis with multiple effortful swallows. For now, recommend a Dys 3 (mechanical soft) diet with honey thick liquids with full supervision for strategies and precautions. Minimal aspiration risk with strategies and modified diet. Expect upgrade as pts strength improves. Will also work on pharyngeal strengthening exercises.     Treatment Recommendation  Therapy as outlined in treatment plan below    Diet Recommendation Dysphagia 3 (Mechanical Soft);Honey-thick liquid   Liquid Administration via: Cup Medication Administration: Whole meds with puree Supervision: Patient able to self feed;Full supervision/cueing for compensatory strategies Compensations: Slow rate;Small sips/bites;Multiple dry swallows after each bite/sip;Effortful swallow;Clear throat intermittently Postural Changes and/or Swallow Maneuvers: Seated upright 90 degrees;Out of bed for meals    Other  Recommendations Oral Care Recommendations: Oral care BID Other Recommendations: Order thickener from pharmacy   Follow Up Recommendations  Inpatient Rehab    Frequency and Duration min 2x/week  2 weeks   Pertinent Vitals/Pain NA    SLP Swallow Goals Patient will utilize recommended strategies during swallow to increase swallowing safety with: Minimal cueing Swallow Study Goal #2 - Progress: Progressing toward goal   General HPI: Patient is an 79 year old female followed by Dr. Ronny Flurry. 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 She presented to the office with increasing nocturnal dyspnea orthopnea and rapid heart rate. She was found to be in new onset of atrial fibrillation and was hospitalized. Followup including echocardiogram and cardiac catheterization confirms significant dilatation of the aortic root( 6.2 cm by echo) and moderate to moderately severe aortic insufficiency. Pt underwent an aortic root descending aorta and aortic valve replacement on 04/02/13. Pt with  some confusion following procedure, pt wants to eat. Type of Study: Modified Barium Swallowing Study Reason for Referral: Objectively evaluate swallowing function Previous Swallow Assessment: BSE Diet Prior to this Study: Nectar-thick liquids;Dysphagia 1 (puree) Temperature Spikes Noted: No Respiratory Status: Supplemental O2 delivered via (comment) History of Recent Intubation: Yes Length of Intubations (days): 1 days Date extubated: 04/02/13 Behavior/Cognition: Alert;Pleasant mood;Cooperative Oral Cavity - Dentition: Adequate natural dentition;Poor condition Oral Motor / Sensory Function: Within functional limits Self-Feeding Abilities: Able to feed self Patient Positioning: Upright in chair Baseline Vocal Quality: Wet;Hoarse;Low vocal intensity Volitional Cough: Wet;Congested Volitional Swallow: Able to elicit Anatomy: Other (Comment) (curled epiglottis, appearance of curvature of servical spine) Pharyngeal Secretions: Not observed secondary MBS    Reason for Referral Objectively evaluate swallowing function   Oral Phase Oral Preparation/Oral Phase Oral Phase: WFL   Pharyngeal Phase Pharyngeal Phase Pharyngeal Phase: Impaired Pharyngeal - Honey Pharyngeal - Honey Cup: Premature spillage to pyriform sinuses;Premature spillage to valleculae;Delayed swallow initiation;Reduced epiglottic inversion;Reduced anterior laryngeal mobility;Reduced laryngeal elevation;Reduced airway/laryngeal closure;Reduced tongue base retraction;Pharyngeal residue - valleculae;Pharyngeal residue - pyriform sinuses Pharyngeal - Nectar Pharyngeal - Nectar Teaspoon: Premature spillage to pyriform sinuses;Premature spillage to valleculae;Delayed swallow initiation;Reduced epiglottic inversion;Reduced anterior laryngeal mobility;Reduced laryngeal elevation;Reduced airway/laryngeal closure;Reduced tongue base retraction;Pharyngeal residue - valleculae;Pharyngeal residue - pyriform sinuses;Penetration/Aspiration  before swallow;Penetration/Aspiration during swallow;Penetration/Aspiration after swallow Penetration/Aspiration details (nectar teaspoon): Material enters airway, remains ABOVE vocal cords and not ejected out;Material enters airway, CONTACTS cords and not ejected out  Pharyngeal - Nectar Cup: Premature spillage to pyriform sinuses;Premature spillage to valleculae;Delayed swallow initiation;Reduced epiglottic inversion;Reduced anterior laryngeal mobility;Reduced laryngeal elevation;Reduced airway/laryngeal closure;Reduced tongue base retraction;Pharyngeal residue - valleculae;Pharyngeal residue - pyriform sinuses;Penetration/Aspiration before swallow;Penetration/Aspiration during swallow;Penetration/Aspiration after swallow;Trace aspiration Penetration/Aspiration details (nectar cup): Material enters airway, remains ABOVE vocal cords and not ejected out;Material enters airway, CONTACTS cords and not ejected out;Material enters airway, passes BELOW cords without attempt by patient to eject out (silent aspiration) Pharyngeal - Thin Pharyngeal - Thin Cup: Premature spillage to pyriform sinuses;Premature spillage to valleculae;Delayed swallow initiation;Reduced epiglottic inversion;Reduced anterior laryngeal mobility;Reduced laryngeal elevation;Reduced airway/laryngeal closure;Reduced tongue base retraction;Pharyngeal residue - valleculae;Pharyngeal residue - pyriform sinuses;Penetration/Aspiration before swallow;Penetration/Aspiration after swallow;Penetration/Aspiration during swallow;Moderate aspiration Penetration/Aspiration details (thin cup): Material enters airway, passes BELOW cords without attempt by patient to eject out (silent aspiration);Material enters airway, CONTACTS cords and not ejected out Pharyngeal - Solids Pharyngeal - Puree: Premature spillage to pyriform sinuses;Premature spillage to valleculae;Delayed swallow initiation;Reduced epiglottic inversion;Reduced anterior laryngeal  mobility;Reduced laryngeal elevation;Reduced airway/laryngeal closure;Reduced tongue base retraction;Pharyngeal residue - valleculae;Pharyngeal residue - pyriform sinuses Pharyngeal - Mechanical Soft: Premature spillage to pyriform sinuses;Premature spillage to valleculae;Delayed swallow initiation;Reduced epiglottic inversion;Reduced anterior laryngeal mobility;Reduced laryngeal elevation;Reduced airway/laryngeal closure;Reduced tongue base retraction;Pharyngeal residue - valleculae;Pharyngeal residue - pyriform sinuses  Cervical Esophageal Phase    GO    Cervical Esophageal Phase Cervical Esophageal Phase: [facility]        Harlon Ditty, MA CCC-SLP [phone]  Denisha Hoel, Riley Nearing 04/06/2013, 11:52 AM

## 2013-04-07 LAB — GLUCOSE, CAPILLARY
Glucose-Capillary: 74 mg/dL (ref 70–99)
Glucose-Capillary: 108 mg/dL — ABNORMAL HIGH (ref 70–99)
Glucose-Capillary: 118 mg/dL — ABNORMAL HIGH (ref 70–99)
Glucose-Capillary: 147 mg/dL — ABNORMAL HIGH (ref 70–99)

## 2013-04-07 LAB — PROTIME-INR: Prothrombin Time: 23.1 seconds — ABNORMAL HIGH (ref 11.6–15.2)

## 2013-04-07 MED ORDER — GUAIFENESIN ER 600 MG PO TB12
600.0000 mg | ORAL_TABLET | Freq: Two times a day (BID) | ORAL | Status: DC | PRN
  Administered 2013-04-07 – 2013-04-08 (×2): 600 mg via ORAL
  Filled 2013-04-07 (×2): qty 1

## 2013-04-07 MED ORDER — COUMADIN BOOK
Freq: Once | Status: AC
  Administered 2013-04-07: 13:00:00
  Filled 2013-04-07: qty 1

## 2013-04-07 MED ORDER — SODIUM CHLORIDE 0.9 % IJ SOLN: 3.0000 mL | INTRAMUSCULAR | Status: DC | PRN

## 2013-04-07 MED ORDER — WARFARIN VIDEO
Freq: Once | Status: AC
  Administered 2013-04-07: 13:00:00

## 2013-04-07 MED ORDER — FUROSEMIDE 40 MG PO TABS
40.0000 mg | ORAL_TABLET | Freq: Every day | ORAL | Status: DC
  Administered 2013-04-07 – 2013-04-09 (×3): 40 mg via ORAL
  Filled 2013-04-07 (×4): qty 1

## 2013-04-07 MED ORDER — SODIUM CHLORIDE 0.9 % IV SOLN: 250.0000 mL | INTRAVENOUS | Status: DC | PRN

## 2013-04-07 MED ORDER — TRAMADOL HCL 50 MG PO TABS
50.0000 mg | ORAL_TABLET | Freq: Four times a day (QID) | ORAL | Status: DC | PRN
  Administered 2013-04-07 – 2013-04-10 (×2): 50 mg via ORAL
  Filled 2013-04-07 (×2): qty 1

## 2013-04-07 MED ORDER — MOVING RIGHT ALONG BOOK
Freq: Once | Status: AC
  Administered 2013-04-07: 12:00:00
  Filled 2013-04-07: qty 1

## 2013-04-07 MED ORDER — AMIODARONE HCL 200 MG PO TABS
200.0000 mg | ORAL_TABLET | Freq: Every day | ORAL | Status: DC
  Administered 2013-04-07 – 2013-04-08 (×2): 200 mg via ORAL
  Filled 2013-04-07 (×2): qty 1

## 2013-04-07 MED ORDER — WARFARIN SODIUM 2.5 MG PO TABS
2.5000 mg | ORAL_TABLET | Freq: Every day | ORAL | Status: DC
  Filled 2013-04-07: qty 1

## 2013-04-07 MED ORDER — SODIUM CHLORIDE 0.9 % IJ SOLN
3.0000 mL | Freq: Two times a day (BID) | INTRAMUSCULAR | Status: DC
  Administered 2013-04-07 – 2013-04-10 (×5): 3 mL via INTRAVENOUS

## 2013-04-07 NOTE — Progress Notes (Signed)
MD Tyrone Sage made aware of pt's low UOP. No new orders  Felipa Emory

## 2013-04-07 NOTE — Plan of Care (Signed)
Problem: Phase III Progression Outcomes Goal: Time patient transferred to PCTU/Telemetry POD Outcome: Completed/Met Date Met:  04/07/13 12:49 PM

## 2013-04-07 NOTE — Progress Notes (Addendum)
                   [address]            [place] [address]          [phone]      7 Days Post-Op Procedure(s) (LRB): ASCENDING AORTIC ROOT REPLACEMENT (N/A) INTRAOPERATIVE TRANSESOPHAGEAL ECHOCARDIOGRAM (N/A) MAZE (N/A)  Subjective: Patient with complaints of cough and some incisional pain.  Objective: Vital signs in last 24 hours: Temp:  [97.5 F (36.4 C)-98.2 F (36.8 C)] 98.2 F (36.8 C) (05/19 1928) Pulse Rate:  [59-105] 61 (05/20 0700) Cardiac Rhythm:  [-] Sinus bradycardia;Normal sinus rhythm (05/20 0400) Resp:  [17-24] 19 (05/20 0700) BP: (94-146)/(49-84) 119/66 mmHg (05/20 0600) SpO2:  [94 %-100 %] 100 % (05/20 0700) Weight:  [63 kg (138 lb 14.2 oz)] 63 kg (138 lb 14.2 oz) (05/20 0500)  Pre op weight 54  kg Current Weight  04/07/13 63 kg (138 lb 14.2 oz)      Intake/Output from previous day: 05/19 0701 - 05/20 0700 In: 880 [P.O.:840; I.V.:40] Out: 441 [Urine:440; Stool:1]   Physical Exam:  Cardiovascular: RRR, no murmurs, gallops, or rubs. Pulmonary: Slightly diminished at bases; no rales, wheezes, or rhonchi. Abdomen: Soft, non tender, bowel sounds present. Extremities: Bilateral lower extremity edema. Wounds: Clean and dry.  No erythema or signs of infection. Neurologic: Grossly intact without focal deficits  Lab Results: CBC: Recent Labs  04/05/13 0415 04/06/13 0410  WBC 7.2 6.0  HGB 7.7* 7.9*  HCT 22.2* 23.4*  PLT 120* 162   BMET:  Recent Labs  04/05/13 0415 04/06/13 0410  NA 134* 134*  K 3.7 5.2*  CL 94* 96  CO2 31 33*  GLUCOSE 100* 102*  BUN 25* 22  CREATININE 0.66 0.54  CALCIUM 8.7 8.8    PT/INR:  Lab Results  Component Value Date   INR 2.15* 04/07/2013   INR 1.59* 04/06/2013   INR 1.18 04/05/2013   ABG:  INR: Will add last result for INR, ABG once components are confirmed Will add last 4 CBG results once components are confirmed  Assessment/Plan:  1. CV - Previous a fib. S/p Maze.SR/SB. HR  mostly in the 60's of late.On Amiodarone 400 bid, Lopressor 25 bid, and Coumadin. Will decrease Amiodarone to 200 daily.INR increased from 1.59 to 2.15. Will hold Coumadin tonight and stop Lovenox. 2.  Pulmonary - Encourage incentive spirometer 3. Volume Overload - Diurese 4.  Acute blood loss anemia - Last H and H  7.9 and 23.4. Will recheck in am 5.GI-swallow study done yesterday. On dysphagia 3 (mechanical soft) diet. 6.Transfer to PCTU  ZIMMERMAN,DONIELLE MPA-C 04/07/2013,7:34 AM  Swallowing improving To steop down today I have seen and examined Jake Michaelis and agree with the above assessment  and plan.  Delight Ovens MD Beeper [phone] Office [phone] 04/07/2013 8:04 AM

## 2013-04-07 NOTE — Progress Notes (Signed)
R IJ sleeve d/c per MD order after PIV started. Wire intact upon removal. Pressure held for 5 minutes. Pressure dressing applied. Tolerated well. Dion Saucier

## 2013-04-07 NOTE — Progress Notes (Signed)
CARDIAC REHAB PHASE I   PRE:  Rate/Rhythm: 75 SR    BP: sitting 86/50    SaO2: 93 RA  MODE:  Ambulation: 150 ft   POST:  Rate/Rhythm: 83 SR    BP: sitting 90/50     SaO2: 84-91 Ra  Pt with BM before walk. Used RW, gait belt, assist x2 to walk short distance. Leans over RW, reminders to stand tall. Tires easily. To recliner after walk. SaO2 low, quickly up, should use O2 next walk, but able to sit without it in recliner. BP low. Will f/u. [phone]   Elissa Lovett Clare CES, ACSM 04/07/2013 3:11 PM

## 2013-04-07 NOTE — Progress Notes (Signed)
Pt t/x to 2015 in wheelchair on monitor without event. Pt's family present. Shanda Bumps RN given bedside report and present during telemetry hookup. Unable to obtain PIV, IV team paged. Pt's sleeve left in until IV access can be obtained  Felipa Emory

## 2013-04-07 NOTE — Progress Notes (Signed)
Physical Therapy Treatment Patient Details Name: Kayla Chambers MRN: [id number] DOB: 1927-10-18 Today's Date: 04/07/2013 Time: 0955-1010 PT Time Calculation (min): 15 min  PT Assessment / Plan / Recommendation Comments on Treatment Session  Pt s/p aortic root replacement with decr endurance and decr balance.  Confusion a little better today.  Will benefit from continued PT to address mobility issues.      Follow Up Recommendations  Home health PT;Supervision/Assistance - 24 hour                 Equipment Recommendations  None recommended by PT       Frequency Min 3X/week   Plan Discharge plan remains appropriate;Frequency remains appropriate    Precautions / Restrictions Precautions Precautions: Sternal;Fall   Pertinent Vitals/Pain VSS, No pain    Mobility  Bed Mobility Bed Mobility: Sit to Supine Sit to Supine: 4: Min assist;HOB elevated Details for Bed Mobility Assistance: Assist for LES Transfers Transfers: Sit to Stand;Stand to Sit Sit to Stand: 3: Mod assist;Without upper extremity assist;From chair/3-in-1 Stand to Sit: 3: Mod assist;Without upper extremity assist;To bed Details for Transfer Assistance: Pt needed cues for hand placement and for sternal precautions.  Pt needed steadying assist initially upon standing.   Ambulation/Gait Ambulation/Gait Assistance: 4: Min assist (+1 for lines) Ambulation Distance (Feet): 350 Feet Assistive device: Rolling walker Ambulation/Gait Assistance Details: Pt continues to need cues for upright posture throughout and to stay close to Rw.  Pt did not take as many standing rest breaks and no sitting rests today.  however, the last 25 feet of the walk were difficult as pt becomes less safe and unsteady when she begins to fatigue needing > assist.  Cues given for pursed lip breathing as well.   Gait Pattern: Step-through pattern;Decreased stride length;Decreased step length - right;Decreased step length - left;Trunk flexed;Wide  base of support Gait velocity: decreased Stairs: No Wheelchair Mobility Wheelchair Mobility: No     PT Goals Acute Rehab PT Goals Pt will go Supine/Side to Sit: with supervision PT Goal: Supine/Side to Sit - Progress: Progressing toward goal Pt will go Sit to Stand: Independently PT Goal: Sit to Stand - Progress: Progressing toward goal Pt will Ambulate: 51 - 150 feet;with modified independence;with least restrictive assistive device PT Goal: Ambulate - Progress: Progressing toward goal  Visit Information  Last PT Received On: 04/07/13 Assistance Needed: +2 (for lines)    Subjective Data  Subjective: "I will try."   Cognition  Cognition Arousal/Alertness: Awake/alert Behavior During Therapy: WFL for tasks assessed/performed Overall Cognitive Status: Within Functional Limits for tasks assessed    Balance  Static Standing Balance Static Standing - Balance Support: Bilateral upper extremity supported;During functional activity Static Standing - Level of Assistance: 4: Min assist  End of Session PT - End of Session Equipment Utilized During Treatment: Gait belt;Oxygen Activity Tolerance: Patient limited by fatigue Patient left: with call bell/phone within reach;with family/visitor present;in chair Nurse Communication: Mobility status       INGOLD,Seleta Hovland 04/07/2013, 11:47 AM  [facility] [phone] (pager)

## 2013-04-07 NOTE — Progress Notes (Signed)
Speech Language Pathology Dysphagia Treatment Patient Details Name: RICHARD HOLZ MRN: [id number] DOB: 01-31-1927 Today's Date: 04/07/2013 Time: 8295-6213 SLP Time Calculation (min): 35 min  Assessment / Plan / Recommendation Clinical Impression  Pt demonstrates improved tolerance of POs following diet modification from MBS. SLP needed to provide moderate to max verbal cues for pt to complete up to 3 multiple swallows for each sip to clear residual. Led pt in base of tongue stregthening exercise x5, but pt easily became distracted. Recommended Shaker exercise, but pt will likely need futher instruciton and education with family to complete regularly. Will continue to follow for advancement and education.     Diet Recommendation  Continue with Current Diet: Dysphagia 3 (mechanical soft);Honey-thick liquid    SLP Plan Continue with current plan of care   Pertinent Vitals/Pain NA   Swallowing Goals  SLP Swallowing Goals Patient will utilize recommended strategies during swallow to increase swallowing safety with: Minimal cueing Swallow Study Goal #2 - Progress: Progressing toward goal  General Temperature Spikes Noted: No Respiratory Status: Room air Behavior/Cognition: Alert;Pleasant mood;Cooperative Oral Cavity - Dentition: Adequate natural dentition;Poor condition Patient Positioning: Upright in chair  Oral Cavity - Oral Hygiene Does patient have any of the following "at risk" factors?: Oxygen therapy - cannula, mask, simple oxygen devices Brush patient's teeth BID with toothbrush (using toothpaste with fluoride): Yes   Dysphagia Treatment Treatment focused on: Skilled observation of diet tolerance;Patient/family/caregiver education;Utilization of compensatory strategies;Facilitation of pharyngeal phase Treatment Methods/Modalities: Skilled observation;Differential diagnosis;Effortful swallow;Shaker exercise Patient observed directly with PO's: Yes Type of PO's observed:  Honey-thick liquids Feeding: Able to feed self Liquids provided via: Cup;No straw Pharyngeal Phase Signs & Symptoms: Suspected delayed swallow initiation Type of cueing: Tactile;Verbal Amount of cueing: Moderate   GO    Harlon Ditty, MA CCC-SLP [phone]  Claudine Mouton 04/07/2013, 4:03 PM

## 2013-04-08 ENCOUNTER — Inpatient Hospital Stay (HOSPITAL_COMMUNITY): Payer: Medicare Other

## 2013-04-08 ENCOUNTER — Encounter (HOSPITAL_COMMUNITY): Payer: Self-pay | Admitting: Nurse Practitioner

## 2013-04-08 DIAGNOSIS — I4891 Unspecified atrial fibrillation: Secondary | ICD-10-CM

## 2013-04-08 DIAGNOSIS — I5032 Chronic diastolic (congestive) heart failure: Secondary | ICD-10-CM

## 2013-04-08 DIAGNOSIS — I2510 Atherosclerotic heart disease of native coronary artery without angina pectoris: Secondary | ICD-10-CM

## 2013-04-08 DIAGNOSIS — I10 Essential (primary) hypertension: Secondary | ICD-10-CM

## 2013-04-08 DIAGNOSIS — I480 Paroxysmal atrial fibrillation: Secondary | ICD-10-CM

## 2013-04-08 DIAGNOSIS — I77810 Thoracic aortic ectasia: Secondary | ICD-10-CM

## 2013-04-08 LAB — GLUCOSE, CAPILLARY
Glucose-Capillary: 110 mg/dL — ABNORMAL HIGH (ref 70–99)
Glucose-Capillary: 125 mg/dL — ABNORMAL HIGH (ref 70–99)
Glucose-Capillary: 137 mg/dL — ABNORMAL HIGH (ref 70–99)
Glucose-Capillary: 109 mg/dL — ABNORMAL HIGH (ref 70–99)

## 2013-04-08 LAB — BASIC METABOLIC PANEL
BUN: 19 mg/dL (ref 6–23)
GFR calc non Af Amer: 89 mL/min — ABNORMAL LOW (ref >90)
Glucose, Bld: 115 mg/dL — ABNORMAL HIGH (ref 70–99)
Potassium: 3.9 mEq/L (ref 3.5–5.1)

## 2013-04-08 LAB — CBC
HCT: 23.4 % — ABNORMAL LOW (ref 36.0–46.0)
Hemoglobin: 7.7 g/dL — ABNORMAL LOW (ref 12.0–15.0)
MCH: 30.6 pg (ref 26.0–34.0)
MCHC: 32.9 g/dL (ref 30.0–36.0)

## 2013-04-08 LAB — PROTIME-INR: INR: 2.65 — ABNORMAL HIGH (ref 0.00–1.49)

## 2013-04-08 MED ORDER — RIVAROXABAN 20 MG PO TABS
20.0000 mg | ORAL_TABLET | Freq: Every day | ORAL | Status: DC
  Administered 2013-04-08: 20 mg via ORAL
  Filled 2013-04-08 (×2): qty 1

## 2013-04-08 MED ORDER — AMIODARONE HCL 200 MG PO TABS
200.0000 mg | ORAL_TABLET | Freq: Two times a day (BID) | ORAL | Status: DC
  Administered 2013-04-08: 200 mg via ORAL
  Filled 2013-04-08 (×3): qty 1

## 2013-04-08 NOTE — Progress Notes (Signed)
CARDIAC REHAB PHASE I   PRE:  Rate/Rhythm: 97 Afib  BP:  Supine: 96/60  Sitting:   Standing:    SaO2: 93 RA  MODE:  Ambulation: 350 ft   POST:  Rate/Rhythm: 119 Afib  BP:  Supine: 104/74  Sitting:   Standing:    SaO2: 97 2L 0935-1000 On arrival pt in bed c/o of no sleep last night, groggy. Assisted X 2 used gait belt. Walker and O2 2l to ambulate. Pt unsteady with standing, lean backward. She was able to get her balance after standing for minute. Pt able to walk 350 feet. She leans forward with walking and need reminders to stay inside walker, this is worse as she tires. Pt back to bed after walk per her request for a nap. Call light in reach and family present.  Melina Copa RN 04/08/2013 9:57 AM

## 2013-04-08 NOTE — Consult Note (Signed)
CARDIOLOGY CONSULT NOTE  Patient ID: Kayla Chambers MRN: [id number], DOB/AGE: 77-Apr-1928   Admit date: 03/31/2013 Date of Consult: 04/08/2013  Primary Physician: [name], MD Primary Cardiologist: Wylene Simmer, MD  Pt. Profile  79 y/o female with h/o dilated Ao root, AI, and PAF who was admitted 5/13 following Ao root replacement, AVR, MAZE, and CABG x 1, whom we've been asked to eval 2/2 PAF.  Problem List  Past Medical History  Diagnosis Date  . Hypothyroidism   . 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 Dilated aortic root     a. 03/2013 s/p Bentall procedure with Ao Root replacement (28mm Valsalva graft) w/ reimplantationof the R and L coronary ostium.  Marland Kitchen Aortic insufficiency     a. 03/2013 s/p  AVR w/ biologic 25mm Magna Ease peric tissue valve, model 300TFX, ser# [id number].   Marland Kitchen CAD (coronary artery disease)     a. 01/2013 mod calcification w/o sev dzs;  b. 03/2013 CABG x1 VG->RCA @ time of AVR  . Paroxysmal a-fib     a. Dx 01/2013 - placed on xarelto;  b. 03/2013 s/p left sided MAZE and LA clip @ time of AVR/CABG.  . Peripheral neuropathy   . Intracranial aneurysm     in late 1970's  . Osteoporosis   . Rheumatic heart disease   . Headache     hx migraines  . Anxiety   . Fibromyalgia   . 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 Neoadjuvant RT by Dr. Abelardo Diesel. Surveillance plan: MRI 12/08/11 shows changes in presumed post-op seroma/hematoma with slight increase in size - follow up MRI 11/18/12 showed same size. Will continue with q3 month MRI. CT C/A/P annually alternating with CXR at 6 months.   . Chronic diastolic CHF (congestive heart failure)     a. 01/2013 echo: EF 50-55%.  . Vertigo   . PMR (polymyalgia rheumatica)     Inactive    Past Surgical History  Procedure Laterality Date  . Sarcoma resection  2011  . Abdominal hysterectomy    .  Bladder suspension    . Colonoscopy  10/25/2011    Procedure: COLONOSCOPY;  Surgeon: Charolett Bumpers, MD;  Location: [facility];  Service: Endoscopy;  Laterality: N/A;  . Eye surgery Bilateral     cataracts  . Ascending aortic root replacement N/A 03/31/2013    Procedure: ASCENDING AORTIC ROOT REPLACEMENT;  Surgeon: Delight Ovens, MD;  Location: [facility] OR;  Service: Open Heart Surgery;  Laterality: N/A;  . Intraoperative transesophageal echocardiogram N/A 03/31/2013    Procedure: INTRAOPERATIVE TRANSESOPHAGEAL ECHOCARDIOGRAM;  Surgeon: Delight Ovens, MD;  Location: [facility] OR;  Service: Open Heart Surgery;  Laterality: N/A;  . Maze N/A 03/31/2013    Procedure: MAZE;  Surgeon: Delight Ovens, MD;  Location: [facility] OR;  Service: Open Heart Surgery;  Laterality: N/A;    Allergies  Allergies  Allergen Reactions  . Alendronate Sodium Nausea And Vomiting  . Ambien (Zolpidem Tartrate)     hallucinations  . Lyrica (Pregabalin) Nausea And Vomiting  . Milk-Related Compounds Diarrhea  . Neurontin (Gabapentin) Nausea And Vomiting  . Procaine Hcl Other (See Comments)    Reaction unknown  . Toprol Xl (Metoprolol Succinate) Other (See Comments)    Does not remember    HPI   79 y/o female with the above complex problem  list.  She had a known history of aortic root dilatation and AI and following admission in March for PAF, she was referred to surgery for evaluation.  She underwent Ao root replacement with reimplantation of the coronary arteries along with bioprosthetic AVR, CABG x 1 (VG->RCA) and L sided MAZE with LA clip placement on 5/13.  Post-op she required diuresis for volume overload and was also in rate controlled afib, which was managed with amiodarone.  She has been anticoagulated with coumadin (previously on xarelto @ home) and converted to sinus rhythm on 5/19.  Amio dose was reduced from 400mg  bid to 200mg  daily on 5/20 and unfortunately, pt went back into a relatively rate controlled afib  earlier this morning.  Rates have been trending in the 90's to low 100's.  She is asymptomatic.  She would like to resume Xarelto as this is what she has at home.  Inpatient Medications  . acidophilus  1 capsule Oral Daily  . amiodarone  200 mg Oral Daily  . aspirin  81 mg Oral Daily  . bisacodyl  10 mg Oral Daily   Or  . bisacodyl  10 mg Rectal Daily  . docusate sodium  200 mg Oral Daily  . Ensure Plus  237 mL Oral TID WC  . furosemide  40 mg Oral Daily  . insulin aspart  0-15 Units Subcutaneous TID WC  . lactase  9,000 Units Oral TID WC  . levothyroxine  75 mcg Oral Daily  . metoprolol tartrate  25 mg Oral BID  . multivitamin with minerals  1 tablet Oral Daily  . pantoprazole  40 mg Oral Daily  . sodium chloride  3 mL Intravenous Q12H  . sodium chloride  3 mL Intravenous Q12H   Family History Family History  Problem Relation Age of Onset  . Heart disease Mother   . Stroke Mother   . Arthritis Father   . Cancer Father   . Hypertension Sister   . Anesthesia problems Brother     Social History History   Social History  . Marital Status: Married    Spouse Name: N/A    Number of Children: N/A  . Years of Education: N/A   Occupational History  . Not on file.   Social History Main Topics  . Smoking status: Never Smoker   . Smokeless tobacco: Never Used  . Alcohol Use: No  . Drug Use: No  . Sexually Active: Yes   Other Topics Concern  . Not on file   Social History Narrative  . No narrative on file    Review of Systems  General:  No chills, fever, night sweats or weight changes.  Cardiovascular:  No chest pain, dyspnea on exertion, mild LE edema, no orthopnea, palpitations, paroxysmal nocturnal dyspnea. Dermatological: No rash, lesions/masses Respiratory: No cough, dyspnea Urologic: No hematuria, dysuria Abdominal:   No nausea, vomiting, diarrhea, bright red blood per rectum, melena, or hematemesis Neurologic:  No visual changes, wkns, changes in mental  status. All other systems reviewed and are otherwise negative except as noted above.  Physical Exam  Blood pressure 102/58, pulse 98, temperature 97.9 F (36.6 C), temperature source Oral, resp. rate 19, height 5' 2.99" (1.6 m), weight 137 lb 5.6 oz (62.3 kg), SpO2 100.00%.  General: Pleasant, NAD Psych: Normal affect. Neuro: Alert and oriented X 3. Moves all extremities spontaneously. HEENT: Normal  Neck: Supple without bruits or JVD. Lungs:  Resp regular and unlabored, somewhat diminished, otw CTA. Heart: irreg, irreg, no  s3, s4, or murmurs. Abdomen: Soft, non-tender, non-distended, BS + x 4.  Chest incision healing well. Extremities: No clubbing, cyanosis.  1+ edema.  DP/PT/Radials 1+ and equal bilaterally.  Labs  Lab Results  Component Value Date   WBC 9.1 04/08/2013   HGB 7.7* 04/08/2013   HCT 23.4* 04/08/2013   MCV 92.9 04/08/2013   PLT 226 04/08/2013    Recent Labs Lab 04/03/13 0729  04/08/13 0440  NA 133*  < > 133*  K 3.7  < > 3.9  CL 94*  < > 95*  CO2 28  < > 30  BUN 14  < > 19  CREATININE 0.39*  < > 0.47*  CALCIUM 8.3*  < > 8.4  PROT 5.2*  --   --   BILITOT 0.3  --   --   ALKPHOS 57  --   --   ALT 18  --   --   AST 35  --   --   GLUCOSE 215*  < > 115*  < > = values in this interval not displayed.  Lab Results  Component Value Date   INR 2.65* 04/08/2013   INR 2.15* 04/07/2013   INR 1.59* 04/06/2013   Radiology/Studies  Dg Chest 2 View  04/08/2013   *RADIOLOGY REPORT*  Clinical Data: Status post CABG  CHEST - 2 VIEW  Comparison: 04/06/2013  Findings: Postsurgical changes compatible with median sternotomy CABG procedure.  There are moderate bilateral pleural effusions which are slightly decreased in volume from previous exam. Atelectasis is noted within the perihilar left lung.  The right IJ catheter has been removed.  No pneumothorax identified.  IMPRESSION:  1.  Persistent bilateral pleural effusions which appear slightly decreased in volume from previous  exam.   Original Report Authenticated By: Signa Kell, M.D.   tele  Afib, low 100's.  ASSESSMENT AND PLAN  1.  Ao dilatation/AI: s/p aortic root replacement and AVR.  2.  PAF:  S/p MAZE and left atrial clip.  Recurrent afib during this hospitalization.  She is currently asymptomatic and relatively rate controlled.  We will increase amio to 200mg  bid.  Cont bb.  As INR is <3.0, we will d/c coumadin and initiate Xarelto @ 20mg  every evening.  3.  CAD:  S/p CABG x 1.  No chest pain.  4.  Normocytic Anemia:  Stable.  Signed, Nicolasa Ducking, NP 04/08/2013, 2:15 PM  Patient examined and agree except changes made. Change to Amiodarone 200mg  po bid and to Xarelto. Dr Patty Sermons is out this week. All questions answered. Will follow with you  Valera Castle, MD 04/08/2013 2:29 PM

## 2013-04-08 NOTE — Progress Notes (Addendum)
8 Days Post-Op Procedure(s) (LRB): ASCENDING AORTIC ROOT REPLACEMENT (N/A) INTRAOPERATIVE TRANSESOPHAGEAL ECHOCARDIOGRAM (N/A) MAZE (N/A) Subjective:  Kayla Chambers states she is sleepy this morning.  She states she did not get much sleep last night due to noise and inability to get comfortable.  She states she feels like she is swallowing better this morning.   Objective: Vital signs in last 24 hours: Temp:  [97.4 F (36.3 C)-98.3 F (36.8 C)] 97.4 F (36.3 C) (05/21 0611) Pulse Rate:  [66-67] 66 (05/21 0611) Cardiac Rhythm:  [-] Normal sinus rhythm (05/20 1915) Resp:  [19-23] 20 (05/21 0611) BP: (83-142)/(41-63) 142/63 mmHg (05/21 0611) SpO2:  [95 %-98 %] 98 % (05/20 2132) Weight:  [137 lb 5.6 oz (62.3 kg)] 137 lb 5.6 oz (62.3 kg) (05/21 0100)  Intake/Output from previous day: 05/20 0701 - 05/21 0700 In: 270 [P.O.:270] Out: -   General appearance: alert, cooperative and no distress Heart: irregularly irregular rhythm Lungs: diminished breath sounds bibasilar Abdomen: soft, non-tender; bowel sounds normal; no masses,  no organomegaly Extremities: edema trace Wound: clean and dry  Lab Results:  Recent Labs  04/06/13 0410 04/08/13 0440  WBC 6.0 9.1  HGB 7.9* 7.7*  HCT 23.4* 23.4*  PLT 162 226   BMET:  Recent Labs  04/06/13 0410 04/08/13 0440  NA 134* 133*  K 5.2* 3.9  CL 96 95*  CO2 33* 30  GLUCOSE 102* 115*  BUN 22 19  CREATININE 0.54 0.47*  CALCIUM 8.8 8.4    PT/INR:  Recent Labs  04/08/13 0440  LABPROT 27.0*  INR 2.65*   ABG    Component Value Date/Time   PHART 7.375 04/01/2013 0909   HCO3 22.6 04/01/2013 0909   TCO2 32 04/03/2013 0721   ACIDBASEDEF 3.0* 04/01/2013 0909   O2SAT 96.0 04/01/2013 0909   CBG (last 3)   Recent Labs  04/07/13 1611 04/07/13 2118 04/08/13 0608  GLUCAP 118* 147* 110*    Assessment/Plan: S/P Procedure(s) (LRB): ASCENDING AORTIC ROOT REPLACEMENT (N/A) INTRAOPERATIVE TRANSESOPHAGEAL ECHOCARDIOGRAM (N/A) MAZE  (N/A)  1. CV- in rate controlled Atrial Fibrillation this morning- continue Amiodarone, Lopressor 2. INR 2.65- this morning, coumadin held last night, will give 1 mg tonight- patient's daughter questions if patient can resume Xarelto instead of being on Coumadin- spoke with [facility] PA who will have patient seen 3. Renal- volume overloaded, will continue Lasix 4. Expected Post operative blood loss anemia- Hgb is stable at 7.7 5. Dysphagia-improving, S/S eval completed, will continue Dysphagia 3 diet 6. Dispo- patient is stable this morning, slowly making progress, may be ready for d/c home on Friday   LOS: 8 days    Lowella Dandy 04/08/2013  Patient has Xarelto at home , will let pt drop and resume xarelto at d/c I have seen and examined Kayla Chambers and agree with the above assessment  and plan.  Delight Ovens MD Beeper [phone] Office [phone] 04/08/2013 10:01 AM

## 2013-04-08 NOTE — Progress Notes (Signed)
Thank you for consult on [name]. Chart reviewed and progress noted. She's making good progress with therapy daily. She is able to walk 350 feet with fewer rest breaks. Would agree with recommendations for follow up HHPT past discharge.

## 2013-04-08 NOTE — Progress Notes (Signed)
Pt is in afib HR 100-110. BP 119/60. PA made aware, no new orders received. Will continue to monitor.

## 2013-04-08 NOTE — Progress Notes (Signed)
04/08/2013 2145 Pt ambulated 350 ft with rolling walker and assist x 2. Pt tolerated well.  Pt returned to bed with call bell in reach. Sherel Fennell, Avie Echevaria , RN

## 2013-04-08 NOTE — Progress Notes (Signed)
Pt ambulated 350 ft with rolling walker x2 assist. Pt oxygen saturations before walk 93% on room air. Checked oxygen halfway throughout ambulation patients oxygen 98% on room air. Back to chair with call bell in reach. Kayla Chambers

## 2013-04-09 DIAGNOSIS — I359 Nonrheumatic aortic valve disorder, unspecified: Secondary | ICD-10-CM

## 2013-04-09 DIAGNOSIS — I2510 Atherosclerotic heart disease of native coronary artery without angina pectoris: Secondary | ICD-10-CM

## 2013-04-09 LAB — GLUCOSE, CAPILLARY
Glucose-Capillary: 104 mg/dL — ABNORMAL HIGH (ref 70–99)
Glucose-Capillary: 147 mg/dL — ABNORMAL HIGH (ref 70–99)
Glucose-Capillary: 94 mg/dL (ref 70–99)
Glucose-Capillary: 111 mg/dL — ABNORMAL HIGH (ref 70–99)

## 2013-04-09 LAB — CBC
HCT: 23.7 % — ABNORMAL LOW (ref 36.0–46.0)
Hemoglobin: 7.7 g/dL — ABNORMAL LOW (ref 12.0–15.0)
MCH: 30.6 pg (ref 26.0–34.0)
MCHC: 32.5 g/dL (ref 30.0–36.0)
MCV: 94 fL (ref 78.0–100.0)
Platelets: 250 10*3/uL (ref 150–400)
RBC: 2.52 MIL/uL — ABNORMAL LOW (ref 3.87–5.11)
RDW: 15.4 % (ref 11.5–15.5)
WBC: 7.1 10*3/uL (ref 4.0–10.5)

## 2013-04-09 LAB — BASIC METABOLIC PANEL
BUN: 14 mg/dL (ref 6–23)
CO2: 32 mEq/L (ref 19–32)
Calcium: 8.3 mg/dL — ABNORMAL LOW (ref 8.4–10.5)
Chloride: 97 mEq/L (ref 96–112)
Creatinine, Ser: 0.52 mg/dL (ref 0.50–1.10)
GFR calc Af Amer: >90 mL/min (ref >90)
GFR calc non Af Amer: 86 mL/min — ABNORMAL LOW (ref >90)
Glucose, Bld: 107 mg/dL — ABNORMAL HIGH (ref 70–99)
Potassium: 3.4 mEq/L — ABNORMAL LOW (ref 3.5–5.1)
Sodium: 137 mEq/L (ref 135–145)

## 2013-04-09 LAB — PROTIME-INR: Prothrombin Time: 39.4 seconds — ABNORMAL HIGH (ref 11.6–15.2)

## 2013-04-09 LAB — PRO B NATRIURETIC PEPTIDE: Pro B Natriuretic peptide (BNP): 7425 pg/mL — ABNORMAL HIGH (ref 0–450)

## 2013-04-09 MED ORDER — LACTASE 9000 UNITS PO CHEW
9000.0000 [IU] | CHEWABLE_TABLET | Freq: Three times a day (TID) | ORAL | Status: DC
  Administered 2013-04-09 – 2013-04-10 (×5): 9000 [IU] via ORAL
  Filled 2013-04-09 (×7): qty 1

## 2013-04-09 MED ORDER — PHYTONADIONE 5 MG PO TABS
5.0000 mg | ORAL_TABLET | Freq: Once | ORAL | Status: AC
  Administered 2013-04-09: 5 mg via ORAL
  Filled 2013-04-09: qty 1

## 2013-04-09 MED ORDER — AMIODARONE HCL 200 MG PO TABS
400.0000 mg | ORAL_TABLET | Freq: Two times a day (BID) | ORAL | Status: DC
  Administered 2013-04-09 (×2): 400 mg via ORAL
  Filled 2013-04-09 (×4): qty 2

## 2013-04-09 MED ORDER — ATORVASTATIN CALCIUM 10 MG PO TABS
10.0000 mg | ORAL_TABLET | Freq: Every day | ORAL | Status: DC
  Administered 2013-04-09: 10 mg via ORAL
  Filled 2013-04-09 (×2): qty 1

## 2013-04-09 NOTE — Discharge Summary (Addendum)
Physician Discharge Summary  Patient ID: Kayla Chambers MRN: [id number] DOB/AGE: [birth year] 79 y.o.  Admit date: 03/31/2013 Discharge date: 04/11/2013  Admission Diagnoses: 1. Dilated aortic root aneurysm 2. Moderate to severe aortic insufficiency 3.History of hypothyroidism 4.History of myxoid liposarcoma (s/p radiation) 5.Paroxysmal atrial fibrillation  Discharge Diagnoses:  1. Dilated aortic root aneurysm 2. Moderate to severe aortic insufficiency 3.History of hypothyroidism 4.History of myxoid liposarcoma (s/p radiation) 5.Paroxysmal atrial fibrillation 6.ABl anemia  Procedure (s):  Bentall procedure with replacement of aortic root  with a 28-mm Valsalva graft with a biologic 25-mm Magna Ease valve,  pericardial tissue valve, model 300TFX, serial number [id number] with  reimplantation of the right and left coronary ostium. Left sided MAZE and left atrial clip procedure and placement of left atrial clip and coronary artery bypass  grafting x1 with reverse saphenous vein graft by Dr. Tyrone Chambers on 03/31/2013.   History of Presenting Illness: This is an 79 year old Caucasian female followed by Dr. Ronny Chambers. 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 She presented to the office with increasing nocturnal dyspnea, orthopnea, and rapid heart rate. She was found to be in new onset of atrial fibrillation and was hospitalized. Followup  echocardiogram and cardiac catheterization confirms significant dilatation of the aortic root( 6.2 cm by echo) and moderate to severe aortic insufficiency. She has a history of rheumatic fever at age 79. She's had no previous history of myocardial infarction. 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 She had a followup scan of the lower extremity at [facility] and was seen by surgery , the note note indicated that there was some slight increase in size of the mass at the old excision site but was too small  to biopsy at this time. With the degree of aortic insufficiency and the size of her aortic root, cardiac surgery was recommended to her. Potential risks, benefits, and complications were discussed with the patient and she agreed to proceed. She underwent a Bentall procedure, left sided MAZE, and CABG x 1 on 03/31/2013.  Brief Hospital Course:  The patient was extubated the evening of surgery without difficulty. She remained afebrile and hemodynamically stable. She was weaned off of Dopamine and Kayla Chambers. Kayla Chambers, a line, and chest tubes were removed early in the post operative course. Her foley did remain for a couple of days as she remained in the ICU and needed close urine output monitoring.Kayla Chambers was started and titrated accordingly She was started on an Amiodarone gttp for a fib. She was started on Coumadin and her INR was monitored daily. She did convert to sinus rhythm on several occasions, but mostly in atrial fibrillation of late.She did have some brief confusion and hallucinations. These did resolve.She was volume over loaded and diuresed. She was weaned off the insulin drip. The patient's HGA1C pre op was  5.1. A swallow study was obtained and speech pathology recommendations were followed accordingly. The patient was felt surgically stable for transfer from the ICU to [facility] for further convalescence on 04/07/2013. She continues to progress with cardiac rehab. She was ambulating on room air. She has been tolerating a diet and has had a bowel movement. Her INR increased to 4.42. Coumadin was stopped and she was given Vitamin K. Follow up INR has decreased to 1.39 and she will resume her home Xarelto at discharge. Epicardial pacing wires and chest tube sutures will be removed prior to discharge.  The patietnt remains volume overloaded and will  be discharged on a daily dose of Lasix.   Provided the patient remains afebrile, hemodynamically stable, and pending morning round evaluation, she will be  surgically stable for discharge on 04/11/2013.  Latest Vital Signs: Blood pressure 113/72, pulse 105, temperature 98.8 F (37.1 C), temperature source Oral, resp. rate 19, height 5' 2.99" (1.6 m), weight 62 kg (136 lb 11 oz), SpO2 93.00%.  Physical Exam: General appearance: alert, cooperative and no distress  Heart: regular rate and rhythm  Lungs: diminished breath sounds bibasilar  Abdomen: soft, non-tender; bowel sounds normal; no masses, no organomegaly  Extremities: edema trace  Wound: clean and dry   Discharge Condition:Stable  Recent laboratory studies:  Lab Results  Component Value Date   WBC 7.1 04/09/2013   HGB 7.7* 04/09/2013   HCT 23.7* 04/09/2013   MCV 94.0 04/09/2013   PLT 250 04/09/2013   Lab Results  Component Value Date   NA 137 04/09/2013   K 3.4* 04/09/2013   CL 97 04/09/2013   CO2 32 04/09/2013   CREATININE 0.52 04/09/2013   GLUCOSE 107* 04/09/2013      Diagnostic Studies: Dg Chest 2 View  04/08/2013   *RADIOLOGY REPORT*  Clinical Data: Status post CABG  CHEST - 2 VIEW  Comparison: 04/06/2013  Findings: Postsurgical changes compatible with median sternotomy CABG procedure.  There are moderate bilateral pleural effusions which are slightly decreased in volume from previous exam. Atelectasis is noted within the perihilar left lung.  The right IJ catheter has been removed.  No pneumothorax identified.  IMPRESSION:  1.  Persistent bilateral pleural effusions which appear slightly decreased in volume from previous exam.   Original Report Authenticated By: Signa Kell, M.D.    Dg Swallowing Func-speech Pathology  04/06/2013   Kayla Chambers, CCC-SLP     04/06/2013 11:55 AM Objective Swallowing Evaluation: Modified Barium Swallowing Study   Patient Details  Name: Kayla Chambers MRN: [id number] Date of Birth: [birth year]  Today's Date: 04/06/2013 Time: 4098-1191 SLP Time Calculation (min): 30 min  Past Medical History:  Past Medical History  Diagnosis Date  .  Hypothyroidism   . 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 Dilated aortic root     Marked dilatation of aortic root  . Aortic insufficiency     Severe, pending possible surgery (awaiting eval for sarcoma)  . Polymyalgia rheumatica   . Vertigo   . Peripheral neuropathy   . Intracranial aneurysm     in late 1970's  . Osteoporosis   . Rheumatic heart disease   . Headache     hx migraines  . Anxiety   . Fibromyalgia   . 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 Neoadjuvant RT by Dr. Abelardo Diesel. Surveillance plan: MRI  12/08/11 shows changes in presumed post-op seroma/hematoma with  slight increase in size - follow up MRI 11/18/12 showed same  size. Will continue with q3 month MRI. CT C/A/P annually  alternating with CXR at 6 months.   . Atrial fibrillation     a. Dx 01/2013.  . Diastolic CHF   . CAD (coronary artery disease)     Moderate coronary calcification, without critical obstruction  by cath 02/02/13  . PMR (polymyalgia rheumatica)     Inactive  . Shortness of breath     cannot lie flat, when afib flares  . Dizziness  Past Surgical History:  Past Surgical History  Procedure Laterality Date  . Sarcoma resection  2011  . Abdominal hysterectomy    . Bladder suspension    . Colonoscopy  10/25/2011    Procedure: COLONOSCOPY;  Surgeon: Charolett Bumpers, MD;   Location: [facility];  Service: Endoscopy;  Laterality: N/A;  . Eye surgery Bilateral     cataracts  . Ascending aortic root replacement N/A 03/31/2013    Procedure: ASCENDING AORTIC ROOT REPLACEMENT;  Surgeon: Delight Ovens, MD;  Location: [facility] OR;  Service: Open Heart Surgery;   Laterality: N/A;  . Intraoperative transesophageal echocardiogram N/A 03/31/2013    Procedure: INTRAOPERATIVE TRANSESOPHAGEAL ECHOCARDIOGRAM;   Surgeon: Delight Ovens, MD;  Location: [facility] OR;  Service: Open  Heart Surgery;  Laterality: N/A;  . Maze N/A 03/31/2013     Procedure: MAZE;  Surgeon: Delight Ovens, MD;  Location: [facility]  OR;  Service: Open Heart Surgery;  Laterality: N/A;   HPI:  Patient is an 79 year old female followed by Dr. Ronny Chambers.  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 She  presented to the office with increasing nocturnal dyspnea  orthopnea and rapid heart rate. She was found to be in new onset  of atrial fibrillation and was hospitalized. Followup including  echocardiogram and cardiac catheterization confirms significant  dilatation of the aortic root( 6.2 cm by echo) and moderate to  moderately severe aortic insufficiency. Pt underwent an aortic  root descending aorta and aortic valve replacement on 04/02/13. Pt  with some confusion following procedure, pt wants to eat.     Assessment / Plan / Recommendation Clinical Impression  Dysphagia Diagnosis: Moderate pharyngeal phase dysphagia Clinical impression: Pt presents with a moderate pharyngeal  dysphagia characterized by a baseline structural component and  exacerbated by generalized weakness and suspected pharyngeal  secretions. The pt has a curled epiglottis as well as the  appearance of  curvature of cervical spine which impedes  epiglottic deflection and laryngeal closure. This is a baseline  difference, but now pt also demonstrates a sensory delay in  swallow initiation, weakness of hyolaryngeal musculature and base  of tongue weakness. These, combined with pts anatomy, result in a  decreased airway protection and moderate pharyngeal residuals  that are silently penetrated/aspirated post swallow. A chin tuck  worsened aspiration in this case. Severity of penetration is the  least with honey thick liqudis with multiple effortful swallows.  For now, recommend a Dys 3 (mechanical soft) diet with honey  thick liquids with full supervision for strategies and  precautions. Minimal aspiration risk with strategies and modified  diet. Expect upgrade as pts strength improves. Will also work on   pharyngeal strengthening exercises.     Treatment Recommendation  Therapy as outlined in treatment plan below    Diet Recommendation Dysphagia 3 (Mechanical Soft);Honey-thick  liquid   Liquid Administration via: Cup Medication Administration: Whole meds with puree Supervision: Patient able to self feed;Full supervision/cueing  for compensatory strategies Compensations: Slow rate;Small sips/bites;Multiple dry swallows  after each bite/sip;Effortful swallow;Clear throat intermittently Postural Changes and/or Swallow Maneuvers: Seated upright 90  degrees;Out of bed for meals    Other  Recommendations Oral Care Recommendations: Oral care BID Other Recommendations: Order thickener from pharmacy   Follow Up Recommendations  Inpatient Rehab    Frequency and Duration min 2x/week  2 weeks   Pertinent Vitals/Pain NA    SLP Swallow Goals Patient will utilize recommended strategies during swallow to  increase swallowing  safety with: Minimal cueing Swallow Study Goal #2 - Progress: Progressing toward goal   General HPI: Patient is an 79 year old female followed by Dr. Ronny Chambers. 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  She presented to the office with increasing nocturnal dyspnea  orthopnea and rapid heart rate. She was found to be in new onset  of atrial fibrillation and was hospitalized. Followup including  echocardiogram and cardiac catheterization confirms significant  dilatation of the aortic root( 6.2 cm by echo) and moderate to  moderately severe aortic insufficiency. Pt underwent an aortic  root descending aorta and aortic valve replacement on 04/02/13. Pt  with some confusion following procedure, pt wants to eat. Type of Study: Modified Barium Swallowing Study Reason for Referral: Objectively evaluate swallowing function Previous Swallow Assessment: BSE Diet Prior to this Study: Nectar-thick liquids;Dysphagia 1  (puree) Temperature Spikes Noted: No Respiratory Status: Supplemental O2 delivered via  (comment) History of Recent Intubation: Yes Length of Intubations (days): 1 days Date extubated: 04/02/13 Behavior/Cognition: Alert;Pleasant mood;Cooperative Oral Cavity - Dentition: Adequate natural dentition;Poor  condition Oral Motor / Sensory Function: Within functional limits Self-Feeding Abilities: Able to feed self Patient Positioning: Upright in chair Baseline Vocal Quality: Wet;Hoarse;Low vocal intensity Volitional Cough: Wet;Congested Volitional Swallow: Able to elicit Anatomy: Other (Comment) (curled epiglottis, appearance of  curvature of servical spine) Pharyngeal Secretions: Not observed secondary MBS    Reason for Referral Objectively evaluate swallowing function   Oral Phase Oral Preparation/Oral Phase Oral Phase: WFL   Pharyngeal Phase Pharyngeal Phase Pharyngeal Phase: Impaired Pharyngeal - Honey Pharyngeal - Honey Cup: Premature spillage to pyriform  sinuses;Premature spillage to valleculae;Delayed swallow  initiation;Reduced epiglottic inversion;Reduced anterior  laryngeal mobility;Reduced laryngeal elevation;Reduced  airway/laryngeal closure;Reduced tongue base  retraction;Pharyngeal residue - valleculae;Pharyngeal residue -  pyriform sinuses Pharyngeal - Nectar Pharyngeal - Nectar Teaspoon: Premature spillage to pyriform  sinuses;Premature spillage to valleculae;Delayed swallow  initiation;Reduced epiglottic inversion;Reduced anterior  laryngeal mobility;Reduced laryngeal elevation;Reduced  airway/laryngeal closure;Reduced tongue base  retraction;Pharyngeal residue - valleculae;Pharyngeal residue -  pyriform sinuses;Penetration/Aspiration before  swallow;Penetration/Aspiration during  swallow;Penetration/Aspiration after swallow Penetration/Aspiration details (nectar teaspoon): Material enters  airway, remains ABOVE vocal cords and not ejected out;Material  enters airway, CONTACTS cords and not ejected out Pharyngeal - Nectar Cup: Premature spillage to pyriform  sinuses;Premature spillage to  valleculae;Delayed swallow  initiation;Reduced epiglottic inversion;Reduced anterior  laryngeal mobility;Reduced laryngeal elevation;Reduced  airway/laryngeal closure;Reduced tongue base  retraction;Pharyngeal residue - valleculae;Pharyngeal residue -  pyriform sinuses;Penetration/Aspiration before  swallow;Penetration/Aspiration during  swallow;Penetration/Aspiration after swallow;Trace aspiration Penetration/Aspiration details (nectar cup): Material enters  airway, remains ABOVE vocal cords and not ejected out;Material  enters airway, CONTACTS cords and not ejected out;Material enters  airway, passes BELOW cords without attempt by patient to eject  out (silent aspiration) Pharyngeal - Thin Pharyngeal - Thin Cup: Premature spillage to pyriform  sinuses;Premature spillage to valleculae;Delayed swallow  initiation;Reduced epiglottic inversion;Reduced anterior  laryngeal mobility;Reduced laryngeal elevation;Reduced  airway/laryngeal closure;Reduced tongue base  retraction;Pharyngeal residue - valleculae;Pharyngeal residue -  pyriform sinuses;Penetration/Aspiration before  swallow;Penetration/Aspiration after  swallow;Penetration/Aspiration during swallow;Moderate aspiration Penetration/Aspiration details (thin cup): Material enters  airway, passes BELOW cords without attempt by patient to eject  out (silent aspiration);Material enters airway, CONTACTS cords  and not ejected out Pharyngeal - Solids Pharyngeal - Puree: Premature spillage to pyriform  sinuses;Premature spillage to valleculae;Delayed swallow  initiation;Reduced epiglottic inversion;Reduced anterior  laryngeal mobility;Reduced laryngeal elevation;Reduced  airway/laryngeal closure;Reduced tongue base  retraction;Pharyngeal residue - valleculae;Pharyngeal residue -  pyriform sinuses Pharyngeal - Mechanical Soft: Premature  spillage to pyriform  sinuses;Premature spillage to valleculae;Delayed swallow  initiation;Reduced epiglottic inversion;Reduced anterior   laryngeal mobility;Reduced laryngeal elevation;Reduced  airway/laryngeal closure;Reduced tongue base  retraction;Pharyngeal residue - valleculae;Pharyngeal residue -  pyriform sinuses  Cervical Esophageal Phase    GO    Cervical Esophageal Phase Cervical Esophageal Phase: [facility]        Harlon Ditty, MA CCC-SLP [phone]  Dyanne Iha Kayla Nearing 04/06/2013, 11:52 AM       Future Appointments Provider Department Dept Phone   04/21/2013 3:30 PM Cassell Clement, MD [facility] Main Office [place]) [phone]      Discharge Medications:    Medication List    STOP taking these medications       diltiazem 120 MG 24 hr capsule  Commonly known as:  CARDIZEM CD      TAKE these medications       ALPRAZolam 0.5 MG tablet  Commonly known as:  XANAX  Take 0.5 mg by mouth at bedtime as needed for sleep or anxiety.     amiodarone 400 MG tablet  Commonly known as:  PACERONE  Take 1 tablet (400 mg total) by mouth daily.     aspirin 81 MG chewable tablet  Chew 1 tablet (81 mg total) by mouth daily.     atorvastatin 10 MG tablet  Commonly known as:  LIPITOR  Take 1 tablet (10 mg total) by mouth daily at 6 PM.     b complex vitamins tablet  Take 1 tablet by mouth daily.     CALCIUM + D PO  Take 1 tablet by mouth 2 (two) times daily.     ciprofloxacin 500 MG tablet  Commonly known as:  CIPRO  Take 1 tablet (500 mg total) by mouth 2 (two) times daily.     CRANBERRY PO  Take 1 tablet by mouth 2 (two) times daily.     fish oil-omega-3 fatty acids 1000 MG capsule  Take 1 g by mouth 2 (two) times daily.     furosemide 40 MG tablet  Commonly known as:  LASIX  Take 1 tablet (40 mg total) by mouth daily.     lactase 3000 UNITS tablet  Commonly known as:  LACTAID  Take 1 tablet by mouth daily as needed (only when eating dairy).     levothyroxine 75 MCG tablet  Commonly known as:  SYNTHROID, LEVOTHROID  Take 75 mcg by mouth daily.     lisinopril 2.5 MG tablet  Commonly known  as:  PRINIVIL,ZESTRIL  Take 1 tablet (2.5 mg total) by mouth daily.     LOPERAMIDE A-D PO  Take 1 tablet by mouth as needed. For diarrhea     magic mouthwash Soln  5 ml swish and swallow three times daily     metoprolol 50 MG tablet  Commonly known as:  Kayla Chambers  Take 1 tablet (50 mg total) by mouth 2 (two) times daily.     multivitamin per tablet  Take 1 tablet by mouth daily.     oxyCODONE 5 MG immediate release tablet  Commonly known as:  Oxy IR/ROXICODONE  Take 1-2 tablets (5-10 mg total) by mouth every 4 (four) hours as needed.     Potassium Chloride ER 20 MEQ Tbcr  Take 10 mEq by mouth daily.     PROBIOTIC DAILY PO  Take 1 capsule by mouth daily.     Rivaroxaban 20 MG Tabs  Commonly known as:  XARELTO  Take 1 tablet (20 mg total) by mouth daily.  traMADol 50 MG tablet  Commonly known as:  ULTRAM  Take 1 tablet (50 mg total) by mouth every 6 (six) hours as needed.     VITAMIN C PO  Take 1 tablet by mouth daily.          The patient has been discharged on:   1.Beta Blocker:  Yes [ x ]                              No   [   ]                              If No, reason:  2.Ace Inhibitor/ARB: Yes [ x  ]                                     No  [   ]                                     If No, reason:   3.Statin:   Yes [  x ]                  No  [   ]                  If No, reason:   4.Ecasa:  Yes  [  x ]                  No   [   ]                  If No, reason:  Follow Up Appointments:     Follow-up Information   Follow up with Cassell Clement, MD. (Call for follow up for 2 weeks)    Contact information:   [address] [place] [address] [phone]       Follow up with GERHARDT,EDWARD B, MD. (PA/LAT CXR to be taken (at [facility] which is in the same building as Dr. Dennie Maizes office) one hour prior to office appointment;Office will mail appointment date and time)    Contact information:   [address] [place] [address] [phone]       Signed: Doree Fudge MPA-C 04/09/2013, 8:32 AM

## 2013-04-09 NOTE — Progress Notes (Signed)
Subjective: No CP  NO SOB Objective: Filed Vitals:   04/08/13 1130 04/08/13 1300 04/08/13 1941 04/09/13 0650  BP: 120/72 102/58 112/64 113/72  Pulse: 104 98 98 105  Temp:  97.9 F (36.6 C) 98.4 F (36.9 C) 98.8 F (37.1 C)  TempSrc:  Oral Oral Oral  Resp:  19 20 19   Height:      Weight:    136 lb 11 oz (62 kg)  SpO2:  100% 96% 93%   Weight change: -10.6 oz (-0.3 kg)  Intake/Output Summary (Last 24 hours) at 04/09/13 1610 Last data filed at 04/08/13 1630  Gross per 24 hour  Intake    480 ml  Output    800 ml  Net   -320 ml    General: Alert, awake, oriented x3, in no acute distress Neck:  JVP is normal Heart: Regular rate and rhythm, without murmurs, rubs, gallops.  Lungs: Clear to auscultation.  No rales or wheezes. Exemities:  No edema.   Neuro: Grossly intact, nonfocal.  Tele:  Afib 100s. Lab Results: Results for orders placed during the hospital encounter of 03/31/13 (from the past 24 hour(s))  GLUCOSE, CAPILLARY     Status: Abnormal   Collection Time    04/08/13 11:07 AM      Result Value Range   Glucose-Capillary 125 (*) 70 - 99 mg/dL  GLUCOSE, CAPILLARY     Status: Abnormal   Collection Time    04/08/13  4:10 PM      Result Value Range   Glucose-Capillary 137 (*) 70 - 99 mg/dL   Comment 1 Notify RN     Comment 2 Documented in Chart    GLUCOSE, CAPILLARY     Status: Abnormal   Collection Time    04/08/13  8:52 PM      Result Value Range   Glucose-Capillary 109 (*) 70 - 99 mg/dL   Comment 1 Documented in Chart     Comment 2 Notify RN    PROTIME-INR     Status: Abnormal   Collection Time    04/09/13  4:55 AM      Result Value Range   Prothrombin Time 39.4 (*) 11.6 - 15.2 seconds   INR 4.42 (*) 0.00 - 1.49  BASIC METABOLIC PANEL     Status: Abnormal   Collection Time    04/09/13  4:55 AM      Result Value Range   Sodium 137  135 - 145 mEq/L   Potassium 3.4 (*) 3.5 - 5.1 mEq/L   Chloride 97  96 - 112 mEq/L   CO2 32  19 - 32 mEq/L   Glucose, Bld  107 (*) 70 - 99 mg/dL   BUN 14  6 - 23 mg/dL   Creatinine, Ser 9.60  0.50 - 1.10 mg/dL   Calcium 8.3 (*) 8.4 - 10.5 mg/dL   GFR calc non Af Amer 86 (*) >90 mL/min   GFR calc Af Amer >90  >90 mL/min  CBC     Status: Abnormal   Collection Time    04/09/13  4:55 AM      Result Value Range   WBC 7.1  4.0 - 10.5 K/uL   RBC 2.52 (*) 3.87 - 5.11 MIL/uL   Hemoglobin 7.7 (*) 12.0 - 15.0 g/dL   HCT 45.4 (*) 09.8 - 11.9 %   MCV 94.0  78.0 - 100.0 fL   MCH 30.6  26.0 - 34.0 pg   MCHC 32.5  30.0 - 36.0  g/dL   RDW 91.4  78.2 - 95.6 %   Platelets 250  150 - 400 K/uL  GLUCOSE, CAPILLARY     Status: Abnormal   Collection Time    04/09/13  6:56 AM      Result Value Range   Glucose-Capillary 104 (*) 70 - 99 mg/dL    Studies/Results: @RISRSLT24 @  Medications: Reviewed   @PROBHOSP @  1.  Atrial fibrillation  Rates are increased  I would recomm increasing amiodarone to 400 bid for now.  Hold Xarelto.  Follow INR before reinitiating  2.  S/P AVR/Aortic root replacement/CABG.  On PO lasix  Would check BNP  3.  Anemia  Follow  4.  HL  LIpids have not been check recently.  Patient not on a statin  I would initiate Lipitor 10 mg Will need to be followed as outpatient.    LOS: 9 days   Dietrich Pates 04/09/2013, 8:33 AM

## 2013-04-09 NOTE — Progress Notes (Addendum)
CARDIAC REHAB PHASE I   PRE:  Rate/Rhythm: 101 Afib  BP:  Supine:   Sitting: 93/53  Standing:    SaO2: 95 RA  MODE:  Ambulation: 350 ft   POST:  Rate/Rhythm: 115 Afib  BP:  Supine:   Sitting: 110/78  Standing:    SaO2: 93 RA 1055-1130 On arrival pt in recliner. Assisted X 2 used gait and walker to ambulate. Pt difficult to get to get out of recliner not using the walker to getup. We tried to had her out her hands on her knees, pt c/o that was painful to her sternum. Pt gets a fast pace walking and required [place] to slow her down. As she tires she leans forward and get to far from walker. We would have her take standing rest stops and that helped her have a better form with walking and it was a safer gait. Pt back to recliner after walk with call light in reach, P t's son and husband present in room for this discussion.She has shower chair and walker at home to use,  Melina Copa RN 04/09/2013 11:19 AM

## 2013-04-09 NOTE — Progress Notes (Signed)
PT Cancellation Note  ___Treatment cancelled today due to medical issues with patient which prohibited therapy  ___ Treatment cancelled today due to patient receiving procedure or test   ___ Treatment cancelled today due to patient's refusal to participate   _X_ Not seen by Physical Therapy as pt amb with nursing this morning and did well reported family  Felecia Shelling  PTA [facility]  Acute  Rehab Pager      [phone]

## 2013-04-09 NOTE — Progress Notes (Addendum)
9 Days Post-Op Procedure(s) (LRB): ASCENDING AORTIC ROOT REPLACEMENT (N/A) INTRAOPERATIVE TRANSESOPHAGEAL ECHOCARDIOGRAM (N/A) MAZE (N/A) Subjective:  Ms. Chambers is without complaints this morning. She states she had a better evening overnight.  She is ambulating with assistance.  Objective: Vital signs in last 24 hours: Temp:  [97.9 F (36.6 C)-98.8 F (37.1 C)] 98.8 F (37.1 C) (05/22 0650) Pulse Rate:  [98-105] 105 (05/22 0650) Cardiac Rhythm:  [-] Normal sinus rhythm;Atrial fibrillation (05/21 1914) Resp:  [19-20] 19 (05/22 0650) BP: (102-120)/(58-72) 113/72 mmHg (05/22 0650) SpO2:  [93 %-100 %] 93 % (05/22 0650) Weight:  [136 lb 11 oz (62 kg)] 136 lb 11 oz (62 kg) (05/22 0650)  Intake/Output from previous day: 05/21 0701 - 05/22 0700 In: 480 [P.O.:480] Out: 800 [Urine:800]  General appearance: alert, cooperative and no distress Heart: regular rate and rhythm Lungs: diminished breath sounds bibasilar Abdomen: soft, non-tender; bowel sounds normal; no masses,  no organomegaly Extremities: edema trace Wound: clean and dry  Lab Results:  Recent Labs  04/08/13 0440 04/09/13 0455  WBC 9.1 7.1  HGB 7.7* 7.7*  HCT 23.4* 23.7*  PLT 226 250   BMET:  Recent Labs  04/08/13 0440  NA 133*  K 3.9  CL 95*  CO2 30  GLUCOSE 115*  BUN 19  CREATININE 0.47*  CALCIUM 8.4    PT/INR:  Recent Labs  04/09/13 0455  LABPROT 39.4*  INR 4.42*   ABG    Component Value Date/Time   PHART 7.375 04/01/2013 0909   HCO3 22.6 04/01/2013 0909   TCO2 32 04/03/2013 0721   ACIDBASEDEF 3.0* 04/01/2013 0909   O2SAT 96.0 04/01/2013 0909   CBG (last 3)   Recent Labs  04/08/13 1610 04/08/13 2052 04/09/13 0656  GLUCAP 137* 109* 104*    Assessment/Plan: S/P Procedure(s) (LRB): ASCENDING AORTIC ROOT REPLACEMENT (N/A) INTRAOPERATIVE TRANSESOPHAGEAL ECHOCARDIOGRAM (N/A) MAZE (N/A)  1. CV- Previous Atrial Fibrillation, currently NSR- on Amiodarone, Lopressor 2. INR 4.42- no  further doses of Coumadin, may benefit from FFP, will resume home Xarelto 3. Volume Status- patient remains volume overloaded, will continue diuresis 4 Expected Post operative anemia- Hgb 7.7 5. Dysphagia-improved, continue current diet 6. Deconditioning- patient has been evaluated by PT, inpatient rehab- felt she is safe for home health 7. Dispo- patient is stable, INR continues to increase may benefit from FFP, plan for home in AM .    LOS: 9 days    BARRETT, Kayla 04/09/2013  Will wait to resume Xarelto until inr decreases, hold further coumadin and let drift down. Poss home tomorrow depending on INR.  I have seen and examined Kayla Chambers and agree with the above assessment  and plan.  Delight Ovens MD Beeper [phone] Office [phone] 04/09/2013 8:04 AM

## 2013-04-10 LAB — GLUCOSE, CAPILLARY
Glucose-Capillary: 110 mg/dL — ABNORMAL HIGH (ref 70–99)
Glucose-Capillary: 155 mg/dL — ABNORMAL HIGH (ref 70–99)

## 2013-04-10 MED ORDER — AMIODARONE HCL 200 MG PO TABS
400.0000 mg | ORAL_TABLET | Freq: Every day | ORAL | Status: DC
  Administered 2013-04-10: 400 mg via ORAL
  Filled 2013-04-10: qty 2

## 2013-04-10 MED ORDER — MAGIC MOUTHWASH: ORAL | Status: DC

## 2013-04-10 MED ORDER — AMIODARONE HCL 400 MG PO TABS: 400.0000 mg | ORAL_TABLET | Freq: Two times a day (BID) | ORAL | Status: DC

## 2013-04-10 MED ORDER — METOPROLOL TARTRATE 50 MG PO TABS: 50.0000 mg | ORAL_TABLET | Freq: Two times a day (BID) | ORAL | Status: DC

## 2013-04-10 MED ORDER — METOPROLOL TARTRATE 50 MG PO TABS
50.0000 mg | ORAL_TABLET | Freq: Two times a day (BID) | ORAL | Status: DC
  Administered 2013-04-10: 50 mg via ORAL
  Filled 2013-04-10 (×2): qty 1

## 2013-04-10 MED ORDER — AMIODARONE HCL 400 MG PO TABS: 400.0000 mg | ORAL_TABLET | Freq: Every day | ORAL | Status: DC

## 2013-04-10 MED ORDER — POTASSIUM CHLORIDE ER 20 MEQ PO TBCR: 10.0000 meq | EXTENDED_RELEASE_TABLET | Freq: Every day | ORAL | Status: DC

## 2013-04-10 MED ORDER — MAGIC MOUTHWASH
5.0000 mL | Freq: Three times a day (TID) | ORAL | Status: DC | PRN
  Administered 2013-04-10: 5 mL via ORAL
  Filled 2013-04-10: qty 5

## 2013-04-10 MED ORDER — ASPIRIN 81 MG PO CHEW: 81.0000 mg | CHEWABLE_TABLET | Freq: Every day | ORAL | Status: DC

## 2013-04-10 MED ORDER — HYDROGEN PEROXIDE 3 % EX SOLN
Freq: Two times a day (BID) | CUTANEOUS | Status: DC
  Filled 2013-04-10: qty 473

## 2013-04-10 MED ORDER — FUROSEMIDE 10 MG/ML IJ SOLN
40.0000 mg | Freq: Two times a day (BID) | INTRAMUSCULAR | Status: DC
  Administered 2013-04-10: 40 mg via INTRAVENOUS
  Filled 2013-04-10: qty 4

## 2013-04-10 MED ORDER — OXYCODONE HCL 5 MG PO TABS: 5.0000 mg | ORAL_TABLET | ORAL | Status: DC | PRN

## 2013-04-10 MED ORDER — FUROSEMIDE 40 MG PO TABS: 40.0000 mg | ORAL_TABLET | Freq: Every day | ORAL | Status: DC

## 2013-04-10 MED ORDER — METOPROLOL TARTRATE 25 MG PO TABS: 25.0000 mg | ORAL_TABLET | Freq: Two times a day (BID) | ORAL | Status: DC

## 2013-04-10 MED ORDER — TRAMADOL HCL 50 MG PO TABS: 50.0000 mg | ORAL_TABLET | Freq: Four times a day (QID) | ORAL | Status: DC | PRN

## 2013-04-10 MED ORDER — ATORVASTATIN CALCIUM 10 MG PO TABS: 10.0000 mg | ORAL_TABLET | Freq: Every day | ORAL | Status: DC

## 2013-04-10 NOTE — Progress Notes (Addendum)
10 Days Post-Op Procedure(s) (LRB): ASCENDING AORTIC ROOT REPLACEMENT (N/A) INTRAOPERATIVE TRANSESOPHAGEAL ECHOCARDIOGRAM (N/A) MAZE (N/A) Subjective:  [name] is without complaints this morning.  She is excited about possibly being discharged today.  She is ambulating with assistance +BM  Objective: Vital signs in last 24 hours: Temp:  [98.1 F (36.7 C)-99 F (37.2 C)] 98.1 F (36.7 C) (05/23 0617) Pulse Rate:  [99-112] 109 (05/23 0617) Cardiac Rhythm:  [-] Atrial fibrillation (05/22 2015) Resp:  [19-20] 19 (05/23 0617) BP: (110-119)/(69-78) 116/75 mmHg (05/23 0617) SpO2:  [95 %-96 %] 96 % (05/23 0617) Weight:  [134 lb 3.2 oz (60.873 kg)] 134 lb 3.2 oz (60.873 kg) (05/23 0617)  Intake/Output from previous day: 05/22 0701 - 05/23 0700 In: 720 [P.O.:720] Out: 725 [Urine:725]  General appearance: alert, cooperative and no distress Neurologic: intact Heart: irregularly irregular rhythm Lungs: diminished breath sounds bibasilar Abdomen: soft, non-tender; bowel sounds normal; no masses,  no organomegaly Extremities: edema trace Wound: clean and dry  Lab Results:  Recent Labs  04/08/13 0440 04/09/13 0455  WBC 9.1 7.1  HGB 7.7* 7.7*  HCT 23.4* 23.7*  PLT 226 250   BMET:  Recent Labs  04/08/13 0440 04/09/13 0455  NA 133* 137  K 3.9 3.4*  CL 95* 97  CO2 30 32  GLUCOSE 115* 107*  BUN 19 14  CREATININE 0.47* 0.52  CALCIUM 8.4 8.3*    PT/INR:  Recent Labs  04/10/13 0455  LABPROT 16.7*  INR 1.39   ABG    Component Value Date/Time   PHART 7.375 04/01/2013 0909   HCO3 22.6 04/01/2013 0909   TCO2 32 04/03/2013 0721   ACIDBASEDEF 3.0* 04/01/2013 0909   O2SAT 96.0 04/01/2013 0909   CBG (last 3)   Recent Labs  04/09/13 1635 04/09/13 2124 04/10/13 0620  GLUCAP 94 111* 110*    Assessment/Plan: S/P Procedure(s) (LRB): ASCENDING AORTIC ROOT REPLACEMENT (N/A) INTRAOPERATIVE TRANSESOPHAGEAL ECHOCARDIOGRAM (N/A) MAZE (N/A)  1. CV- Back in Atrial  Fibrillation this AM, rate in the 100s- continue Amiodarone, Lopressor, will resume Xarelto today 2. Pulm- no acute issues, continue IS 3. INR- 1.39 after administration of vitamin K, will d/c EPW 4. Renal- remains volume overloaded, BNP >7000 will give IV lasix this morning 5. Dysphagia- continue Dysphagia 3 diet, have arranged home speech and swallow eval 6. Deconditioning- home PT arranged, patient's daughter is OT 7. Dispo- patient is stable, INR is decreased will remove EPW this morning, will discuss diuretic regimen with staff, plan for d/c home this afternoon   LOS: 10 days    Raford Pitcher, ERIN 04/10/2013  Patient wants to go home, plan d/c today, home on Xarelto as was preop Added magic mouthwash for sore mouth/lips does not look like herpetic infection I have seen and examined Jake Michaelis and agree with the above assessment  and plan.  Delight Ovens MD Beeper [phone] Office [phone] 04/10/2013 8:59 AM

## 2013-04-10 NOTE — Progress Notes (Signed)
Speech Language Pathology Dysphagia Treatment Patient Details Name: SHAKIYLA KOOK MRN: [id number] DOB: August 16, 1927 Today's Date: 04/10/2013 Time: 4098-1191 SLP Time Calculation (min): 18 min  Assessment / Plan / Recommendation Clinical Impression  Pt seen prior to d/c home. Unfortunately pt still on honey thick liquids. Pts husband expressed concern and hope for her to drink thin liquids. SLP provided trials of thin liquids and drew attention to pts subtle signs of aspiration. Discussed that upgrading pt prior to d/c would be very risky and SLP advised them to be very careful when paking decision regarding drinking due to risk of silent aspiration. Pt and husband agreed. SLP offered education regarding thickening liquids (written and verbal). Pt will have HH SLP follow pt and SLP advised them to continue current diet until seen by [facility] SLP. Also requested pt and husband return for outpatient MBS after one week. Called Dr. Tyrone Sage and he agreed to this plan; he will set up appt at his first outpt appt with her.     Diet Recommendation  Continue with Current Diet: Dysphagia 3 (mechanical soft);Honey-thick liquid    SLP Plan Discharge SLP treatment due to (comment)   Pertinent Vitals/Pain NA   Swallowing Goals  SLP Swallowing Goals Patient will utilize recommended strategies during swallow to increase swallowing safety with: Minimal cueing Swallow Study Goal #2 - Progress: Progressing toward goal Goal #3: Pt will consume trials of thin liquids and purees with min verbal cues and assist without overt evidence of aspiration.  Swallow Study Goal #3 - Progress: Progressing toward goal  General Temperature Spikes Noted: No Respiratory Status: Room air Behavior/Cognition: Alert;Cooperative Oral Cavity - Dentition: Adequate natural dentition;Poor condition Patient Positioning: Upright in bed  Oral Cavity - Oral Hygiene Does patient have any of the following "at risk" factors?: Other -  dysphagia Brush patient's teeth BID with toothbrush (using toothpaste with fluoride): Yes Patient is AT RISK - Oral Care Protocol followed (see row info): Yes   Dysphagia Treatment Treatment focused on: Skilled observation of diet tolerance;Patient/family/caregiver education;Utilization of compensatory strategies;Facilitation of pharyngeal phase Family/Caregiver Educated: spouse Treatment Methods/Modalities: Skilled observation;Differential diagnosis;Effortful swallow;Shaker exercise Patient observed directly with PO's: Yes Type of PO's observed: Thin liquids Feeding: Needs assist Liquids provided via: Cup;No straw Oral Phase Signs & Symptoms: Prolonged bolus formation;Prolonged oral phase Pharyngeal Phase Signs & Symptoms: Suspected delayed swallow initiation;Wet vocal quality;Immediate throat clear Type of cueing: Tactile;Verbal Amount of cueing: Moderate   GO    Harlon Ditty, [place] CCC-SLP [phone]  Claudine Mouton 04/10/2013, 4:34 PM

## 2013-04-10 NOTE — Progress Notes (Signed)
Patient ID: Kayla Chambers, female   DOB: [birth year], 79 y.o.   MRN: [id number] Multiple complaints, none cardiac. Soreness of mouth and lips which are blistered. No obvious yeast. Rate still >100.   Will Rx Hydrogen peroxide MW and increase Lopressor to 50mg  bid. Change Amiodarone to 400 mg q day.

## 2013-04-10 NOTE — Progress Notes (Signed)
[id number] Education completed with pt and family. Discussed in detail sternal precautions and family demonstrated how to get pt up. Had pt rock and family assisted. Discussed CRP 2 and permission given to refer to [facility] Phase 2. Put on d/c video for pt and family to view. Luetta Nutting RNBSN

## 2013-04-14 ENCOUNTER — Encounter: Payer: Self-pay | Admitting: Cardiothoracic Surgery

## 2013-04-15 ENCOUNTER — Telehealth: Payer: Self-pay | Admitting: Cardiology

## 2013-04-15 NOTE — Telephone Encounter (Signed)
New Prob      pts husband calling in wanting to know if pt appt on 6/3 is completely necessary. Would like to speak to nurse.

## 2013-04-16 ENCOUNTER — Other Ambulatory Visit: Payer: Self-pay | Admitting: *Deleted

## 2013-04-16 DIAGNOSIS — B370 Candidal stomatitis: Secondary | ICD-10-CM

## 2013-04-16 MED ORDER — MAGIC MOUTHWASH: ORAL | Status: DC

## 2013-04-16 NOTE — Telephone Encounter (Signed)
Left message to call back  

## 2013-04-17 NOTE — Telephone Encounter (Signed)
Spoke with husband and he will be bringing her to ov next week

## 2013-04-21 ENCOUNTER — Ambulatory Visit (INDEPENDENT_AMBULATORY_CARE_PROVIDER_SITE_OTHER): Payer: Medicare Other | Admitting: Cardiology

## 2013-04-21 ENCOUNTER — Encounter: Payer: Self-pay | Admitting: Cardiology

## 2013-04-21 VITALS — BP 122/64 | HR 94 | Ht 65.0 in | Wt 114.8 lb

## 2013-04-21 DIAGNOSIS — I2510 Atherosclerotic heart disease of native coronary artery without angina pectoris: Secondary | ICD-10-CM

## 2013-04-21 DIAGNOSIS — D649 Anemia, unspecified: Secondary | ICD-10-CM

## 2013-04-21 DIAGNOSIS — I5033 Acute on chronic diastolic (congestive) heart failure: Secondary | ICD-10-CM

## 2013-04-21 DIAGNOSIS — I480 Paroxysmal atrial fibrillation: Secondary | ICD-10-CM

## 2013-04-21 DIAGNOSIS — I4891 Unspecified atrial fibrillation: Secondary | ICD-10-CM

## 2013-04-21 NOTE — Assessment & Plan Note (Signed)
The patient is in atrial fibrillation today clinically.  We did not do an EKG.  She is tolerating her arrhythmia.  She continues to be on Xarelto and is on amiodarone 400 mg daily.  In the past she has come out of atrial fibrillation on her own but if she fails to do so we will consider outpatient elective cardioversion after her next office visit

## 2013-04-21 NOTE — Assessment & Plan Note (Signed)
The patient has not been experiencing any chest pain or angina. 

## 2013-04-21 NOTE — Assessment & Plan Note (Signed)
Her symptoms of CHF have resolved post surgery.  She no longer has any peripheral edema.

## 2013-04-21 NOTE — Patient Instructions (Addendum)
Will obtain labs today and call you with the results (cbc/bmet/hfp)  Your physician recommends that you continue on your current medications as directed. Please refer to the Current Medication list given to you today.  Your physician recommends that you schedule a follow-up appointment in: 1 month ov/ekg

## 2013-04-21 NOTE — Progress Notes (Signed)
Kayla Chambers Date of Birth:  02-10-1927 [facility] [address] [place], [place]  [address] [phone]         Fax   [phone]  History of Present Illness: This pleasant 79 year old woman is seen for a post office visit.  She underwent a Bentall procedure by Dr. Tyrone Sage on 03/31/13.  This involved replacing her a standing aortic, insertion of a bioprosthetic pericardial tissue valve for her aortic valve, as well as single-vessel CABG and a Maze procedure.  Preoperatively she had been in and out of atrial fibrillation.  When she initially went home from the hospital she was in sinus rhythm a short while and then has been back in atrial fibrillation.  She has been anticoagulated with Xarelto the entire time.  Overall she remains weak but is gaining strength.  Her daughter from [place] has come here for a month to help care for her.  There has been a question of some possible sleep apnea.  She has also had some difficulty with congestion in her throat and she saw Dr. Suszanne Conners ENT physician who has placed her on a Medrol Dosepak.  Current Outpatient Prescriptions  Medication Sig Dispense Refill  . ALPRAZolam (XANAX) 0.5 MG tablet Take 0.5 mg by mouth at bedtime as needed for sleep or anxiety.      . Alum & Mag Hydroxide-Simeth (MAGIC MOUTHWASH) SOLN 5 ml swish and swallow three times daily  120 mL  0  . amiodarone (PACERONE) 400 MG tablet Take 1 tablet (400 mg total) by mouth daily.  30 tablet  1  . Ascorbic Acid (VITAMIN C PO) Take 1 tablet by mouth daily.      Marland Kitchen aspirin 81 MG chewable tablet Chew 1 tablet (81 mg total) by mouth daily.      Marland Kitchen atorvastatin (LIPITOR) 10 MG tablet Take 1 tablet (10 mg total) by mouth daily at 6 PM.  30 tablet  3  . b complex vitamins tablet Take 1 tablet by mouth daily.      . Calcium Carbonate-Vitamin D (CALCIUM + D PO) Take 1 tablet by mouth 2 (two) times daily.       Marland Kitchen CRANBERRY PO Take 1 tablet by mouth 2 (two) times daily.       .  fish oil-omega-3 fatty acids 1000 MG capsule Take 1 g by mouth 2 (two) times daily.       . furosemide (LASIX) 40 MG tablet Take 1 tablet (40 mg total) by mouth daily.  30 tablet  3  . lactase (LACTAID) 3000 UNITS tablet Take 1 tablet by mouth daily as needed (only when eating dairy).      Marland Kitchen levothyroxine (SYNTHROID, LEVOTHROID) 75 MCG tablet Take 75 mcg by mouth daily.        Marland Kitchen lisinopril (PRINIVIL,ZESTRIL) 2.5 MG tablet Take 1 tablet (2.5 mg total) by mouth daily.  30 tablet  6  . Loperamide HCl (LOPERAMIDE A-D PO) Take 1 tablet by mouth as needed. For diarrhea      . metoprolol tartrate (LOPRESSOR) 50 MG tablet Take 1 tablet (50 mg total) by mouth 2 (two) times daily.  60 tablet  1  . multivitamin (THERAGRAN) per tablet Take 1 tablet by mouth daily.        Marland Kitchen oxyCODONE (OXY IR/ROXICODONE) 5 MG immediate release tablet Take 1-2 tablets (5-10 mg total) by mouth every 4 (four) hours as needed.  30 tablet  0  . potassium chloride 20 MEQ TBCR Take  10 mEq by mouth daily.  30 tablet  3  . predniSONE (STERAPRED UNI-PAK) 10 MG tablet Take 20 mg by mouth daily. Take for six days      . Probiotic Product (PROBIOTIC DAILY PO) Take 1 capsule by mouth daily.       . Rivaroxaban (XARELTO) 20 MG TABS Take 1 tablet (20 mg total) by mouth daily.  90 tablet  1  . traMADol (ULTRAM) 50 MG tablet Take 1 tablet (50 mg total) by mouth every 6 (six) hours as needed.  30 tablet  0   No current facility-administered medications for this visit.    Allergies  Allergen Reactions  . Alendronate Sodium Nausea And Vomiting  . Ambien (Zolpidem Tartrate)     hallucinations  . Lyrica (Pregabalin) Nausea And Vomiting  . Milk-Related Compounds Diarrhea  . Neurontin (Gabapentin) Nausea And Vomiting  . Procaine Hcl Other (See Comments)    Reaction unknown  . Toprol Xl (Metoprolol Succinate) Other (See Comments)    Does not remember. Tolerates Lopressor 04/10/13, [name]    Patient Active Problem List   Diagnosis Date Noted    . Peripheral neuropathy 03/07/2011    Priority: High  . Aortic insufficiency 03/06/2011    Priority: Medium  . 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 PAF (paroxysmal atrial fibrillation) 04/08/2013  . HTN (hypertension) 04/08/2013  . Chronic diastolic CHF (congestive heart failure) 04/08/2013  . CAD (coronary artery disease) 04/08/2013  . Atrial fibrillation 01/31/2013  . Acute on chronic diastolic heart failure 01/31/2013  . Disorder of liver 10/07/2012  . Solitary pulmonary nodule 10/07/2012  . 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 Elevated blood pressure 09/12/2011  . Dizziness 08/09/2011  . Dilated aortic root 03/06/2011  . Hypothyroidism 03/06/2011  . Polymyalgia rheumatica 03/06/2011    History  Smoking status  . Never Smoker   Smokeless tobacco  . Never Used    History  Alcohol Use No    Family History  Problem Relation Age of Onset  . Heart disease Mother   . Stroke Mother   . Arthritis Father   . Cancer Father   . Hypertension Sister   . Anesthesia problems Brother     Review of Systems: Constitutional: no fever chills diaphoresis or fatigue or change in weight.  Head and neck: no hearing loss, no epistaxis, no photophobia or visual disturbance. Respiratory: No cough, shortness of breath or wheezing. Cardiovascular: No chest pain peripheral edema, palpitations. Gastrointestinal: No abdominal distention, no abdominal pain, no change in bowel habits hematochezia or melena. Genitourinary: No dysuria, no frequency, no urgency, no nocturia. Musculoskeletal:No arthralgias, no back pain, no gait disturbance or myalgias. Neurological: No dizziness, no headaches, no numbness, no seizures, no syncope, no weakness, no tremors. Hematologic: No lymphadenopathy, no easy bruising. Psychiatric: No confusion, no hallucinations, no sleep disturbance.    Physical Exam: Filed Vitals:   04/21/13 1547  BP: 122/64  Pulse: 94   the general  appearance reveals the pleasant elderly woman in no distress.  She is sitting in a wheelchair.The head and neck exam reveals pupils equal and reactive.  Extraocular movements are full.  There is no scleral icterus.  The mouth and pharynx are normal.  The neck is supple.  The carotids reveal no bruits.  The jugular venous pressure is normal.  The  thyroid is not enlarged.  There is no lymphadenopathy.  The chest is clear to percussion and auscultation.  There are no rales  or rhonchi.  Expansion of the chest is symmetrical.  The precordium is quiet.  The pulse is irregularly irregular  The first heart sound is normal.  The second heart sound is physiologically split.  There is no murmur gallop rub or click.  The aortic closure sound is crisp and there is no aortic insufficiency There is no abnormal lift or heave.  The abdomen is soft and nontender.  The bowel sounds are normal.  The liver and spleen are not enlarged.  There are no abdominal masses.  There are no abdominal bruits.  Extremities reveal good pedal pulses.  There is no phlebitis or edema.  There is no cyanosis or clubbing.  Strength is normal and symmetrical in all extremities.  There is no lateralizing weakness.  There are no sensory deficits.  The skin is warm and dry.  There is no rash.  The leg incisions appear to be healing well.    Assessment / Plan:  Continue same medication.  Recheck in one month for office visit and EKG and TSH and free T4 levels. Consider possible cardioversion after next visit if necessary.

## 2013-04-22 LAB — BASIC METABOLIC PANEL
Calcium: 9.1 mg/dL (ref 8.4–10.5)
Chloride: 100 mEq/L (ref 96–112)
Creatinine, Ser: 0.6 mg/dL (ref 0.4–1.2)
GFR: 101.34 mL/min (ref >60.00)

## 2013-04-22 LAB — CBC WITH DIFFERENTIAL/PLATELET
Basophils Absolute: 0 10*3/uL (ref 0.0–0.1)
Eosinophils Absolute: 0 10*3/uL (ref 0.0–0.7)
Hemoglobin: 10.4 g/dL — ABNORMAL LOW (ref 12.0–15.0)
Lymphocytes Relative: 4.4 % — ABNORMAL LOW (ref 12.0–46.0)
MCHC: 32.5 g/dL (ref 30.0–36.0)
Monocytes Absolute: 0 10*3/uL — ABNORMAL LOW (ref 0.1–1.0)
Neutro Abs: 5.8 10*3/uL (ref 1.4–7.7)
Neutrophils Relative %: 94.9 % — ABNORMAL HIGH (ref 43.0–77.0)
RDW: 18.3 % — ABNORMAL HIGH (ref 11.5–14.6)

## 2013-04-22 LAB — HEPATIC FUNCTION PANEL
ALT: 14 U/L (ref 0–35)
Bilirubin, Direct: 0.1 mg/dL (ref 0.0–0.3)
Total Bilirubin: 0.4 mg/dL (ref 0.3–1.2)

## 2013-04-22 NOTE — Progress Notes (Signed)
Quick Note:  Please report to patient. The recent labs are stable. Continue same medication and careful diet. Hemoglobin is improving-- from 7.7 now up to 10.4. WBC is normal. Potassium good. LFTs normal. ______

## 2013-04-23 ENCOUNTER — Telehealth: Payer: Self-pay | Admitting: *Deleted

## 2013-04-23 DIAGNOSIS — Z79899 Other long term (current) drug therapy: Secondary | ICD-10-CM

## 2013-04-23 NOTE — Telephone Encounter (Signed)
Message copied by Burnell Blanks on Thu Apr 23, 2013  3:31 PM ------      Message from: Cassell Clement      Created: Wed Apr 22, 2013  8:43 PM       Please report to patient.  The recent labs are stable. Continue same medication and careful diet. Hemoglobin is improving-- from 7.7 now up to 10.4.  WBC is normal.      Potassium good.  LFTs normal. ------

## 2013-04-23 NOTE — Telephone Encounter (Signed)
Advised husband of labs 

## 2013-04-26 ENCOUNTER — Encounter (HOSPITAL_COMMUNITY): Payer: Self-pay | Admitting: Emergency Medicine

## 2013-04-26 ENCOUNTER — Emergency Department (HOSPITAL_COMMUNITY)
Admission: EM | Admit: 2013-04-26 | Discharge: 2013-04-26 | Disposition: A | Discharge status: Home / Self Care | Payer: Medicare Other | Attending: Emergency Medicine | Admitting: Emergency Medicine

## 2013-04-26 ENCOUNTER — Emergency Department (HOSPITAL_COMMUNITY): Payer: Medicare Other

## 2013-04-26 DIAGNOSIS — IMO0001 Reserved for inherently not codable concepts without codable children: Secondary | ICD-10-CM | POA: Insufficient documentation

## 2013-04-26 DIAGNOSIS — Z8669 Personal history of other diseases of the nervous system and sense organs: Secondary | ICD-10-CM | POA: Insufficient documentation

## 2013-04-26 DIAGNOSIS — Z8589 Personal history of malignant neoplasm of other organs and systems: Secondary | ICD-10-CM | POA: Insufficient documentation

## 2013-04-26 DIAGNOSIS — Z7982 Long term (current) use of aspirin: Secondary | ICD-10-CM | POA: Insufficient documentation

## 2013-04-26 DIAGNOSIS — E860 Dehydration: Secondary | ICD-10-CM | POA: Insufficient documentation

## 2013-04-26 DIAGNOSIS — F411 Generalized anxiety disorder: Secondary | ICD-10-CM | POA: Insufficient documentation

## 2013-04-26 DIAGNOSIS — E039 Hypothyroidism, unspecified: Secondary | ICD-10-CM | POA: Insufficient documentation

## 2013-04-26 DIAGNOSIS — M810 Age-related osteoporosis without current pathological fracture: Secondary | ICD-10-CM | POA: Insufficient documentation

## 2013-04-26 DIAGNOSIS — I509 Heart failure, unspecified: Secondary | ICD-10-CM | POA: Insufficient documentation

## 2013-04-26 DIAGNOSIS — Z79899 Other long term (current) drug therapy: Secondary | ICD-10-CM | POA: Insufficient documentation

## 2013-04-26 DIAGNOSIS — I4891 Unspecified atrial fibrillation: Secondary | ICD-10-CM | POA: Insufficient documentation

## 2013-04-26 DIAGNOSIS — I2510 Atherosclerotic heart disease of native coronary artery without angina pectoris: Secondary | ICD-10-CM | POA: Insufficient documentation

## 2013-04-26 DIAGNOSIS — Z8739 Personal history of other diseases of the musculoskeletal system and connective tissue: Secondary | ICD-10-CM | POA: Insufficient documentation

## 2013-04-26 DIAGNOSIS — Z8679 Personal history of other diseases of the circulatory system: Secondary | ICD-10-CM | POA: Insufficient documentation

## 2013-04-26 DIAGNOSIS — I5032 Chronic diastolic (congestive) heart failure: Secondary | ICD-10-CM | POA: Insufficient documentation

## 2013-04-26 LAB — POCT I-STAT TROPONIN I
Troponin i, poc: 0.01 ng/mL (ref 0.00–0.08)
Troponin i, poc: 0.02 ng/mL (ref 0.00–0.08)

## 2013-04-26 LAB — CBC
HCT: 37.4 % (ref 36.0–46.0)
Hemoglobin: 12 g/dL (ref 12.0–15.0)
MCH: 30.8 pg (ref 26.0–34.0)
MCHC: 32.1 g/dL (ref 30.0–36.0)
MCV: 95.9 fL (ref 78.0–100.0)
RBC: 3.9 MIL/uL (ref 3.87–5.11)

## 2013-04-26 LAB — BASIC METABOLIC PANEL
BUN: 27 mg/dL — ABNORMAL HIGH (ref 6–23)
CO2: 30 mEq/L (ref 19–32)
Chloride: 97 mEq/L (ref 96–112)
Glucose, Bld: 97 mg/dL (ref 70–99)
Potassium: 4.5 mEq/L (ref 3.5–5.1)

## 2013-04-26 MED ORDER — FUROSEMIDE 40 MG PO TABS: 20.0000 mg | ORAL_TABLET | Freq: Every day | ORAL | Status: DC

## 2013-04-26 NOTE — ED Provider Notes (Signed)
History     CSN: [id number]  Arrival date & time 04/26/13  1723   First MD Initiated Contact with Patient 04/26/13 1729      Chief Complaint  Patient presents with  . Shortness of Breath    HPI Patient had open heart surgery for an aortic valve repair performed on May 13 of last month. Since that time the patient has had some issues with weight loss. She has also had issues with some nasal congestion and difficulty breathing especially at night. The symptoms were felt to be related to vocal cord inflammation. Patient has seen her thoracic surgeon and also saw the ENT specialist this past week.  Patient was told she did have some vocal cord inflammation still and was started on steroids.  They felt it was possible that she could have a component of sleep apnea.  The patient's daughters have been staying with her at night. They have noticed that she has had episodes of loud snoring as well as periods of apnea. Yesterday the patient was starting to feel more weak than usual. She also felt a little bit she more short of breath with walking than usual. He decided to come to the emergency be evaluated.  He has not had any fevers. She has not noticed any increased swelling. She denies any chest pain. She has not noticed any bleeding or other complaints.  The family has noticed that her ankle swelling has decreased significantly. The patient also has been losing weight presumably from fluid loss. Past Medical History  Diagnosis Date  . Hypothyroidism   . 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 Dilated aortic root     a. 03/2013 s/p Bentall procedure with Ao Root replacement (28mm Valsalva graft) w/ reimplantationof the R and L coronary ostium.  Marland Kitchen Aortic insufficiency     a. 03/2013 s/p  AVR w/ biologic 25mm Magna Ease peric tissue valve, model 300TFX, ser# [id number].   Marland Kitchen CAD (coronary artery disease)     a. 01/2013 mod calcification w/o sev dzs;  b. 03/2013 CABG x1  VG->RCA @ time of AVR  . Paroxysmal a-fib     a. Dx 01/2013 - placed on xarelto;  b. 03/2013 s/p left sided MAZE and LA clip @ time of AVR/CABG.  . Peripheral neuropathy   . Intracranial aneurysm     in late 1970's  . Osteoporosis   . Rheumatic heart disease   . Headache(784.0)     hx migraines  . Anxiety   . Fibromyalgia   . 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 Neoadjuvant RT by Dr. Abelardo Diesel. Surveillance plan: MRI 12/08/11 shows changes in presumed post-op seroma/hematoma with slight increase in size - follow up MRI 11/18/12 showed same size. Will continue with q3 month MRI. CT C/A/P annually alternating with CXR at 6 months.   . Chronic diastolic CHF (congestive heart failure)     a. 01/2013 echo: EF 50-55%.  . Vertigo   . PMR (polymyalgia rheumatica)     Inactive    Past Surgical History  Procedure Laterality Date  . Sarcoma resection  2011  . Abdominal hysterectomy    . Bladder suspension    . Colonoscopy  10/25/2011    Procedure: COLONOSCOPY;  Surgeon: Charolett Bumpers, MD;  Location: [facility];  Service: Endoscopy;  Laterality: N/A;  . Eye surgery  Bilateral     cataracts  . Ascending aortic root replacement N/A 03/31/2013    Procedure: ASCENDING AORTIC ROOT REPLACEMENT;  Surgeon: Delight Ovens, MD;  Location: [facility] OR;  Service: Open Heart Surgery;  Laterality: N/A;  . Intraoperative transesophageal echocardiogram N/A 03/31/2013    Procedure: INTRAOPERATIVE TRANSESOPHAGEAL ECHOCARDIOGRAM;  Surgeon: Delight Ovens, MD;  Location: [facility] OR;  Service: Open Heart Surgery;  Laterality: N/A;  . Maze N/A 03/31/2013    Procedure: MAZE;  Surgeon: Delight Ovens, MD;  Location: [facility] OR;  Service: Open Heart Surgery;  Laterality: N/A;    Family History  Problem Relation Age of Onset  . Heart disease Mother   . Stroke Mother   . Arthritis Father   . Cancer Father   . Hypertension Sister    . Anesthesia problems Brother     History  Substance Use Topics  . Smoking status: Never Smoker   . Smokeless tobacco: Never Used  . Alcohol Use: No    OB History   Grav Para Term Preterm Abortions TAB SAB Ect Mult Living                  Review of Systems  All other systems reviewed and are negative.    Allergies  Alendronate sodium; Ambien; Lyrica; Milk-related compounds; Neurontin; Procaine hcl; and Toprol xl  Home Medications   Current Outpatient Rx  Name  Route  Sig  Dispense  Refill  . ALPRAZolam (XANAX) 0.5 MG tablet   Oral   Take 0.5 mg by mouth at bedtime as needed for sleep or anxiety.         . Alum & Mag Hydroxide-Simeth (MAGIC MOUTHWASH) SOLN   Oral   Take 5 mLs by mouth daily as needed (thrush).         Marland Kitchen amiodarone (PACERONE) 400 MG tablet   Oral   Take 400 mg by mouth daily.         . Ascorbic Acid (VITAMIN C PO)   Oral   Take 1 tablet by mouth daily.         Marland Kitchen aspirin 81 MG chewable tablet   Oral   Chew 1 tablet (81 mg total) by mouth daily.         Marland Kitchen atorvastatin (LIPITOR) 10 MG tablet   Oral   Take 1 tablet (10 mg total) by mouth daily at 6 PM.   30 tablet   3   . b complex vitamins tablet   Oral   Take 1 tablet by mouth daily.         . Calcium Carbonate-Vitamin D (CALCIUM + D PO)   Oral   Take 1 tablet by mouth 2 (two) times daily.          Marland Kitchen CRANBERRY PO   Oral   Take 1 tablet by mouth 2 (two) times daily.          . fish oil-omega-3 fatty acids 1000 MG capsule   Oral   Take 1 g by mouth 2 (two) times daily.          . furosemide (LASIX) 40 MG tablet   Oral   Take 1 tablet (40 mg total) by mouth daily.   30 tablet   3   . lactase (LACTAID) 3000 UNITS tablet   Oral   Take 1 tablet by mouth daily as needed (only when eating dairy).         Marland Kitchen levothyroxine (SYNTHROID, LEVOTHROID) 75  MCG tablet   Oral   Take 75 mcg by mouth daily.           Marland Kitchen lisinopril (PRINIVIL,ZESTRIL) 2.5 MG tablet    Oral   Take 1 tablet (2.5 mg total) by mouth daily.   30 tablet   6   . Loperamide HCl (LOPERAMIDE A-D PO)   Oral   Take 1 tablet by mouth 4 (four) times daily as needed (diarrhea).          . metoprolol tartrate (LOPRESSOR) 50 MG tablet   Oral   Take 1 tablet (50 mg total) by mouth 2 (two) times daily.   60 tablet   1   . Multiple Vitamin (MULTIVITAMIN WITH MINERALS) TABS   Oral   Take 1 tablet by mouth daily.         Marland Kitchen oxyCODONE (OXY IR/ROXICODONE) 5 MG immediate release tablet   Oral   Take 5-10 mg by mouth every 4 (four) hours as needed for pain.         . potassium chloride 20 MEQ TBCR   Oral   Take 10 mEq by mouth daily.   30 tablet   3   . Probiotic Product (PROBIOTIC DAILY PO)   Oral   Take 1 capsule by mouth daily.          . Rivaroxaban (XARELTO) 20 MG TABS   Oral   Take 20 mg by mouth daily.         . traMADol (ULTRAM) 50 MG tablet   Oral   Take 50 mg by mouth every 6 (six) hours as needed for pain.           BP 125/78  Pulse 62  Temp(Src) 98.2 F (36.8 C) (Oral)  Resp 20  SpO2 99%  Physical Exam  Nursing note and vitals reviewed. Constitutional: She appears well-developed and well-nourished. No distress.  HENT:  Head: Normocephalic and atraumatic.  Right Ear: External ear normal.  Left Ear: External ear normal.  Mouth/Throat: No oropharyngeal exudate.  Mild nasal congestion, no stridor  Eyes: Conjunctivae are normal. Right eye exhibits no discharge. Left eye exhibits no discharge. No scleral icterus.  Neck: Neck supple. No tracheal deviation present.  Cardiovascular: Normal rate, regular rhythm and intact distal pulses.   Pulmonary/Chest: Effort normal and breath sounds normal. No stridor. No respiratory distress. She has no wheezes. She has no rales. Tenderness: mild appropriate tenderness to the chest wall.  Healing surgical incision anterior chest wall  Abdominal: Soft. Bowel sounds are normal. She exhibits no distension.  There is no tenderness. There is no rebound and no guarding.  Musculoskeletal: She exhibits no edema and no tenderness.  Healing vein grafts right lower extremity, no erythema no drainage, no swelling  Neurological: She is alert. She has normal strength. No sensory deficit. Cranial nerve deficit:  no gross defecits noted. She exhibits normal muscle tone. She displays no seizure activity. Coordination normal.  Skin: Skin is warm and dry. No rash noted.  Psychiatric: She has a normal mood and affect.    ED Course  Procedures (including critical care time) EKG Normal sinus rhythm rate 66 Left anterior fascicular block Left atrium abnormality Repolarization abnormality Repolarization abnormality is increased compared to prior EKG dated 4T May 2014 Labs Reviewed  BASIC METABOLIC PANEL - Abnormal; Notable for the following:    Sodium 134 (*)    BUN 27 (*)    GFR calc non Af Amer 65 (*)    GFR  calc Af Amer 76 (*)    All other components within normal limits  CBC - Abnormal; Notable for the following:    RDW 16.8 (*)    All other components within normal limits  PRO B NATRIURETIC PEPTIDE - Abnormal; Notable for the following:    Pro B Natriuretic peptide (BNP) 2934.0 (*)    All other components within normal limits  POCT I-STAT TROPONIN I  POCT I-STAT TROPONIN I   Dg Chest 2 View  04/26/2013   *RADIOLOGY REPORT*  Clinical Data: Shortness of breath.  Status post thoracic surgery on 03/31/2013 with replacement of aortic root and valve with Maze procedure, left ventricle pain and CABG.  CHEST - 2 VIEW  Comparison: 04/08/2013  Findings: Pleural effusions have resolved since the prior study. No pulmonary edema, focal consolidation or pneumothorax is identified.  Heart size and mediastinal contours are stable postoperatively.  IMPRESSION: No acute findings postoperatively.  No pneumothorax.  Pleural effusions have resolved since the prior study.   Original Report Authenticated By: Irish Lack,  M.D.     MDM  Patient's symptoms have been ongoing for a period of time since her surgery. A lot of her airway symptoms seem to be related to nasal congestion and possibly is from vocal cord edema associated with her prior intubation. She is seeing ENT doctor. There doesn't appear to be any acute emergency associated with that today.  Patient has had some general weakness. Her BUN is increased compared to prior results.  Pt has had significant fluid loss and her edema is decreasing.  i will have her drop her lasix dose in half and follow up with her doctor this week.  I have discussed the findings and plan with the patient and her family.        Celene Kras, MD 04/26/13 2053

## 2013-04-26 NOTE — ED Notes (Signed)
Pt to ED via EMS with c/o SOB since heart surgery for valve repair on 03/31/13 that worsen today. Pt reports weight loss since surgery, about 1 pound a day. Per EMS BP-113/74, O2-98% on room air, RR-16, pulse-68, CBG-114.

## 2013-04-28 ENCOUNTER — Telehealth: Payer: Self-pay | Admitting: Cardiology

## 2013-04-28 DIAGNOSIS — F329 Major depressive disorder, single episode, unspecified: Secondary | ICD-10-CM

## 2013-04-28 MED ORDER — SERTRALINE HCL 25 MG PO TABS: 25.0000 mg | ORAL_TABLET | Freq: Every day | ORAL | Status: DC

## 2013-04-28 NOTE — Telephone Encounter (Signed)
Advised patient of Rx.

## 2013-04-28 NOTE — Telephone Encounter (Signed)
Will forward to  Dr. Brackbill for review 

## 2013-04-28 NOTE — Telephone Encounter (Signed)
Citalopram 10 mg one daily

## 2013-04-28 NOTE — Telephone Encounter (Signed)
New problem   pts husband says pt is depressed and he wants to know what they should do

## 2013-04-28 NOTE — Telephone Encounter (Signed)
Drug interaction with Citalopram and Amiodarone discussed with  Dr. Patty Sermons, will change to Zoloft 25 mg daily. Rx sent to pharmacy

## 2013-04-29 ENCOUNTER — Telehealth: Payer: Self-pay | Admitting: Cardiology

## 2013-04-29 DIAGNOSIS — R198 Other specified symptoms and signs involving the digestive system and abdomen: Secondary | ICD-10-CM

## 2013-04-29 DIAGNOSIS — R1110 Vomiting, unspecified: Secondary | ICD-10-CM

## 2013-04-29 MED ORDER — FUROSEMIDE 20 MG PO TABS: 20.0000 mg | ORAL_TABLET | ORAL | Status: DC

## 2013-04-29 NOTE — Telephone Encounter (Signed)
New problem   Wanting to know if an order can be modified by someone since dr Patty Sermons isn't in today

## 2013-04-29 NOTE — Telephone Encounter (Signed)
Have her decrease her lasix to 20 mg QOD

## 2013-04-29 NOTE — Telephone Encounter (Signed)
Okay to order a modified barium swallow at [facility].

## 2013-04-29 NOTE — Telephone Encounter (Signed)
Olegario Messier, Speech Language Therapist, with [facility], voices concern that patient may be "coming down with pneumonia" after trials of unthickened water (after becoming dehydrated) based on "patient having a gag/dry heave behavior with water trial". Patient has and continues to be on "honey thick liquids" (other than water trials) since discharged from aortic root replacement 'in-patient' status on May 19th. Olegario Messier denies any further symptomology (no fever, no coughing, no respiratory issues). Olegario Messier requests consideration for patient to have an order for a modified barium swallow at [facility] to address issue and to determine need to continue on the honey thick liquid diet. Dr. Patty Sermons is currently out of the office. Request routed to MD/RN.

## 2013-04-29 NOTE — Telephone Encounter (Signed)
Spoke with [facility] nurse, reviewed ED note, pt was decreased on her lasix from 40 mg to 20 mg two days ago, nurse was wondering if she should have it stopped due to continued wt loss from 115 lb to 109 lb this is over several weeks.  ED labs// BUN elevated but creat normal, lasix cut due to dehydration.  Per nurse, no edema, sob with exertion but has had Aortic root procedure done with Dr Tyrone Sage.  Dx afib/chf/ dysphagia; Nurse was told to continue to monitor since decrease has only been two days. Plus pt still recovering from  Aortic root replacement.  Told nurse I would inform Dr/Nurse and for her to call with further changes, she agreed to plan.

## 2013-04-29 NOTE — Telephone Encounter (Signed)
New problem   [facility] nurse calling.      Patient On lasix 20  Mg every day. Was on  40 mg  Went to hospital for sob. Was told by MD to cut lasix in half . Patient is still  Losing weight. Please advise.

## 2013-04-29 NOTE — Telephone Encounter (Signed)
Alona Bene RN was contacted and given update, she will contact the pt. Med list adjusted.

## 2013-04-30 ENCOUNTER — Telehealth: Payer: Self-pay | Admitting: Cardiology

## 2013-04-30 NOTE — Telephone Encounter (Signed)
Will call tomorrow after 8:00, office closed when called earlier [phone]

## 2013-04-30 NOTE — Telephone Encounter (Signed)
New Problem  Erin @ [facility] is concerned about pt BP, pt's BP has been 72/48 today and sitting. Pulse rate is 60 and regular.

## 2013-04-30 NOTE — Telephone Encounter (Signed)
Discussed with  Dr. Patty Sermons and will have patient decrease Amiodarone to 200 mg daily and hold Furosemide altogether for now. Spoke with husband and patient is feeling better. Her systolic blood pressure up to 96 now. Husband denies any swelling. Will call back if any further problems  Advise Erin with [facility] as well

## 2013-04-30 NOTE — Telephone Encounter (Signed)
Follow Up ° ° °Returning call from earlier. Please call back. °

## 2013-04-30 NOTE — Addendum Note (Signed)
Addended by: Regis Bill B on: 04/30/2013 05:01 PM   Modules accepted: Orders

## 2013-04-30 NOTE — Telephone Encounter (Signed)
Left message to call back  

## 2013-04-30 NOTE — Telephone Encounter (Signed)
Husband verbalized understanding.

## 2013-05-01 ENCOUNTER — Other Ambulatory Visit: Payer: Self-pay | Admitting: *Deleted

## 2013-05-01 ENCOUNTER — Encounter: Payer: Self-pay | Admitting: *Deleted

## 2013-05-01 ENCOUNTER — Telehealth: Payer: Self-pay | Admitting: Cardiology

## 2013-05-01 ENCOUNTER — Other Ambulatory Visit (HOSPITAL_COMMUNITY): Payer: Self-pay | Admitting: Cardiology

## 2013-05-01 DIAGNOSIS — R1310 Dysphagia, unspecified: Secondary | ICD-10-CM

## 2013-05-01 NOTE — Telephone Encounter (Signed)
Spoke with [facility] and they will contact patient and schedule modified barium swallow

## 2013-05-01 NOTE — Telephone Encounter (Signed)
Daughter needs letter for work that it was necessary for her to be here for a month. Will do letter and have  Dr. Patty Sermons sign

## 2013-05-01 NOTE — Telephone Encounter (Signed)
Advised daughter, verbalized understanding  

## 2013-05-01 NOTE — Telephone Encounter (Signed)
New problem   Reporting pts b/p: 90/60 sitting -111/64 lying down - 60/50 standing

## 2013-05-01 NOTE — Telephone Encounter (Signed)
New Problem  Pt states he needs to speak with you. He said he was returning your call, did not say what it was about.

## 2013-05-01 NOTE — Telephone Encounter (Signed)
Decrease metoprolol to 25 mg BID.  Hold lisinopril if systolic BP is less than 100.

## 2013-05-01 NOTE — Telephone Encounter (Signed)
Spoke with daughter and patient is feeling very tired. Did do exercising on the bed and that wore her out and had to take a nap. Does seem to be more tired today than yesterday. Last night blood pressure did get up to 125/75. Will forward to  Dr. Patty Sermons for review

## 2013-05-01 NOTE — Addendum Note (Signed)
Addended by: Regis Bill B on: 05/01/2013 02:18 PM   Modules accepted: Orders

## 2013-05-02 ENCOUNTER — Telehealth: Payer: Self-pay | Admitting: Nurse Practitioner

## 2013-05-02 NOTE — Telephone Encounter (Signed)
Pts husband called this AM to report that her BP was between 116 and 128 after awakening and thus she was given metoprolol 25 and lisinopril 10.  After breakfast however, pressure dropped into the low to mid 80's.  She is asymptomatic.  I rec that they hold lisinopril going forward and continue to monitor BP to ensure that she doesn't become hypertensive.  If so, we may want to resume lisinopril at a lower dose.  He verbalized understanding and was grateful for the call back.

## 2013-05-04 ENCOUNTER — Telehealth: Payer: Self-pay | Admitting: *Deleted

## 2013-05-04 ENCOUNTER — Other Ambulatory Visit: Payer: Self-pay | Admitting: *Deleted

## 2013-05-04 DIAGNOSIS — I359 Nonrheumatic aortic valve disorder, unspecified: Secondary | ICD-10-CM

## 2013-05-04 DIAGNOSIS — I77810 Thoracic aortic ectasia: Secondary | ICD-10-CM

## 2013-05-04 NOTE — Telephone Encounter (Signed)
LETTER MAILED FOR DAUGHTERS EXCUSE TO BRING HER TO HER APP.

## 2013-05-06 ENCOUNTER — Ambulatory Visit (HOSPITAL_COMMUNITY)
Admission: RE | Admit: 2013-05-06 | Discharge: 2013-05-06 | Disposition: A | Discharge status: Home / Self Care | Payer: Medicare Other | Source: Ambulatory Visit | Attending: Cardiology | Admitting: Cardiology

## 2013-05-06 DIAGNOSIS — R198 Other specified symptoms and signs involving the digestive system and abdomen: Secondary | ICD-10-CM

## 2013-05-06 DIAGNOSIS — R1310 Dysphagia, unspecified: Secondary | ICD-10-CM | POA: Insufficient documentation

## 2013-05-06 DIAGNOSIS — R1110 Vomiting, unspecified: Secondary | ICD-10-CM

## 2013-05-06 NOTE — Procedures (Signed)
Objective Swallowing Evaluation: Modified Barium Swallowing Study  Patient Details  Name: Kayla Chambers MRN: [id number] Date of Birth: 20-Apr-1927  Today's Date: 05/06/2013 Time: 4098-1191 SLP Time Calculation (min): 20 min  Past Medical History:  Past Medical History  Diagnosis Date  . Hypothyroidism   . 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 Dilated aortic root     a. 03/2013 s/p Bentall procedure with Ao Root replacement (28mm Valsalva graft) w/ reimplantationof the R and L coronary ostium.  Marland Kitchen Aortic insufficiency     a. 03/2013 s/p  AVR w/ biologic 25mm Magna Ease peric tissue valve, model 300TFX, ser# [id number].   Marland Kitchen CAD (coronary artery disease)     a. 01/2013 mod calcification w/o sev dzs;  b. 03/2013 CABG x1 VG->RCA @ time of AVR  . Paroxysmal a-fib     a. Dx 01/2013 - placed on xarelto;  b. 03/2013 s/p left sided MAZE and LA clip @ time of AVR/CABG.  . Peripheral neuropathy   . Intracranial aneurysm     in late 1970's  . Osteoporosis   . Rheumatic heart disease   . Headache(784.0)     hx migraines  . Anxiety   . Fibromyalgia   . 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 Neoadjuvant RT by Dr. Abelardo Diesel. Surveillance plan: MRI 12/08/11 shows changes in presumed post-op seroma/hematoma with slight increase in size - follow up MRI 11/18/12 showed same size. Will continue with q3 month MRI. CT C/A/P annually alternating with CXR at 6 months.   . Chronic diastolic CHF (congestive heart failure)     a. 01/2013 echo: EF 50-55%.  . Vertigo   . PMR (polymyalgia rheumatica)     Inactive   Past Surgical History:  Past Surgical History  Procedure Laterality Date  . Sarcoma resection  2011  . Abdominal hysterectomy    . Bladder suspension    . Colonoscopy  10/25/2011    Procedure: COLONOSCOPY;  Surgeon: Charolett Bumpers, MD;  Location: [facility];  Service:  Endoscopy;  Laterality: N/A;  . Eye surgery Bilateral     cataracts  . Ascending aortic root replacement N/A 03/31/2013    Procedure: ASCENDING AORTIC ROOT REPLACEMENT;  Surgeon: Delight Ovens, MD;  Location: [facility] OR;  Service: Open Heart Surgery;  Laterality: N/A;  . Intraoperative transesophageal echocardiogram N/A 03/31/2013    Procedure: INTRAOPERATIVE TRANSESOPHAGEAL ECHOCARDIOGRAM;  Surgeon: Delight Ovens, MD;  Location: [facility] OR;  Service: Open Heart Surgery;  Laterality: N/A;  . Maze N/A 03/31/2013    Procedure: MAZE;  Surgeon: Delight Ovens, MD;  Location: [facility] OR;  Service: Open Heart Surgery;  Laterality: N/A;   HPI:  79 year old female followed by Dr. Ronny Flurry. 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 She presented to the office with increasing nocturnal dyspnea orthopnea and rapid heart rate. She was found to be in new onset of atrial fibrillation and was hospitalized. Followup including echocardiogram and cardiac catheterization confirms significant dilatation of the aortic root( 6.2 cm by echo) and moderate to moderately severe aortic insufficiency. Pt underwent an aortic root descending aorta and aortic valve replacement on 04/02/13.   Confusion and dysphagia s/p surgery.  MBS 04/06/13: moderate dysphagia with silent aspiration and recs for dysphagia 3, honey-thick at D/C.  Pt has been participating in [facility] for swallowing therapy.  Her  husband believes swallow function has improved. Pt returns today for OPMBS.       Assessment / Plan / Recommendation Clinical Impression  Dysphagia Diagnosis: Within Functional Limits Clinical impression: Pt demonstrates marked improvements in swallow function with improved bolus propusion, improved hyolaryngeal mobility, and reliable airway closure,  There was no penetration nor aspiration, despite taxing of mechanism.  Recommend resumption of regular consistency diet, thin liquids, continue to take meds with water.  Of note,  esophageal scan revealed retention of solid barium  throughout esophageal column with slowed emptying.      Treatment Recommendation  No treatment recommended at this time    Diet Recommendation Regular;Thin liquid   Liquid Administration via: Cup;Straw Medication Administration: Whole meds with liquid Supervision: Patient able to self feed    Other  Recommendations     Follow Up Recommendations  None    SLP Swallow Goals     General Previous Swallow Assessment: Dysphagia Diagnosis: Moderate pharyngeal phase dysphagia Clinical impression: Pt presents with a moderate pharyngeal dysphagia characterized by a baseline structural component and exacerbated by generalized weakness and suspected pharyngeal secretions. The pt has a curled epiglottis as well as the appearance of curvature of cervical spine which impedes epiglottic deflection and laryngeal closure. This is a baseline difference, but now pt also demonstrates a sensory delay in swallow initiation, weakness of hyolaryngeal musculature and base of tongue weakness. These, combined with pts anatomy, result in a decreased airway protection and moderate pharyngeal residuals that are silently penetrated/aspirated post swallow. A chin tuck worsened aspiration in this case. Severity of penetration is the least with honey thick liqudis with multiple effortful swallows. For now, recommend a Dys 3 (mechanical soft) diet with honey thick liquids with full supervision for strategies and precautions. Minimal aspiration risk with strategies and modified diet. Expect upgrade as pts strength improves. Will also work on pharyngeal strengthening exercises.  Diet Prior to this Study: Dysphagia 3 (soft);Honey-thick liquids Temperature Spikes Noted: No Respiratory Status: Room air History of Recent Intubation: No Behavior/Cognition: Alert;Cooperative;Pleasant mood Oral Cavity - Dentition: Adequate natural dentition Oral Motor / Sensory Function: Within  functional limits Self-Feeding Abilities: Able to feed self Patient Positioning: Upright in chair Baseline Vocal Quality: Clear Volitional Cough: Strong Volitional Swallow: Able to elicit Anatomy: Other (Comment) (ant curvature of cerv spine) Pharyngeal Secretions: Not observed secondary MBS    Reason for Referral Objectively evaluate swallowing function   Oral Phase Oral Preparation/Oral Phase Oral Phase: WFL   Pharyngeal Phase Pharyngeal Phase Pharyngeal Phase: Within functional limits  Cervical Esophageal Phase    GO    Cervical Esophageal Phase Cervical Esophageal Phase: [facility]    Functional Assessment Tool Used: clinical judgement Functional Limitations: Swallowing Swallow Current Status (A5409): 0 percent impaired, limited or restricted Swallow Goal Status (W1191): 0 percent impaired, limited or restricted Swallow Discharge Status (Y7829): 0 percent impaired, limited or restricted    Blenda Mounts Laurice 05/06/2013, 11:08 AM

## 2013-05-07 ENCOUNTER — Ambulatory Visit
Admission: RE | Admit: 2013-05-07 | Discharge: 2013-05-07 | Disposition: A | Discharge status: Home / Self Care | Payer: Medicare Other | Source: Ambulatory Visit | Attending: Cardiothoracic Surgery | Admitting: Cardiothoracic Surgery

## 2013-05-07 ENCOUNTER — Other Ambulatory Visit: Payer: Self-pay | Admitting: *Deleted

## 2013-05-07 ENCOUNTER — Encounter: Payer: Self-pay | Admitting: Cardiothoracic Surgery

## 2013-05-07 ENCOUNTER — Ambulatory Visit (INDEPENDENT_AMBULATORY_CARE_PROVIDER_SITE_OTHER): Payer: Self-pay | Admitting: Cardiothoracic Surgery

## 2013-05-07 VITALS — BP 110/67 | HR 60 | Resp 16 | Ht 65.0 in | Wt 106.0 lb

## 2013-05-07 DIAGNOSIS — I77810 Thoracic aortic ectasia: Secondary | ICD-10-CM

## 2013-05-07 DIAGNOSIS — I351 Nonrheumatic aortic (valve) insufficiency: Secondary | ICD-10-CM

## 2013-05-07 DIAGNOSIS — B370 Candidal stomatitis: Secondary | ICD-10-CM

## 2013-05-07 DIAGNOSIS — Z09 Encounter for follow-up examination after completed treatment for conditions other than malignant neoplasm: Secondary | ICD-10-CM

## 2013-05-07 DIAGNOSIS — I359 Nonrheumatic aortic valve disorder, unspecified: Secondary | ICD-10-CM

## 2013-05-07 DIAGNOSIS — I4891 Unspecified atrial fibrillation: Secondary | ICD-10-CM

## 2013-05-07 MED ORDER — MAGIC MOUTHWASH: 5.0000 mL | Freq: Every day | ORAL | Status: DC | PRN

## 2013-05-07 NOTE — Progress Notes (Signed)
[address]       [place] [address]             [phone]                  Kayla Chambers [facility] Medical Record #[id number] Date of Birth: 12-Oct-1927  Kayla Clement, MD Kayla Rubenstein, MD  Chief Complaint:   PostOp Follow Up Visit 03/31/2013  PREOPERATIVE DIAGNOSIS: Dilated aortic root to 6.2 cm and moderate-to-  severe aortic insufficiency.  POSTOPERATIVE DIAGNOSIS: Dilated aortic root to 6.2 cm and moderate-to-  severe aortic insufficiency.  PROCEDURE PERFORMED: Bentall procedure with replacement of aortic root  with a 28-mm Valsalva graft with a biologic 25-mm Magna Ease valve,  pericardial tissue valve, model 300TFX, serial number [id number] with  reimplantation of the right and left coronary ostium. Left sided MAZE  procedure and placement of left atrial clip and coronary artery bypass  grafting x1 with reverse saphenous vein graft   History of Present Illness:      Patient is now approximately one month postop he is in reasonable progress at home, she has had some trouble with her blood pressure being low and her ACE inhibitor has been modified. She has no overt symptoms of congestive heart failure. She does have a poor appetite but this is improving. Her swallowing difficulties early postop are also improved. She passed her repeat swallowing evaluation recently.     History  Smoking status  . Never Smoker   Smokeless tobacco  . Never Used       Allergies  Allergen Reactions  . Alendronate Sodium Nausea And Vomiting  . Ambien (Zolpidem Tartrate)     hallucinations  . Lyrica (Pregabalin) Nausea And Vomiting  . Milk-Related Compounds Diarrhea  . Neurontin (Gabapentin) Nausea And Vomiting  . Procaine Hcl Other (See Comments)    Reaction unknown  . Toprol Xl (Metoprolol Succinate) Other (See Comments)    Does not remember. Tolerates Lopressor 04/10/13, [name]    Current Outpatient Prescriptions  Medication Sig Dispense  Refill  . ALPRAZolam (XANAX) 0.5 MG tablet Take 0.5 mg by mouth at bedtime as needed for sleep or anxiety.      . Alum & Mag Hydroxide-Simeth (MAGIC MOUTHWASH) SOLN Take 5 mLs by mouth daily as needed (thrush).      Marland Kitchen amiodarone (PACERONE) 400 MG tablet Take 400 mg by mouth as directed. 1/2 tablet daily      . Ascorbic Acid (VITAMIN C PO) Take 1 tablet by mouth daily.      Marland Kitchen aspirin 81 MG chewable tablet Chew 1 tablet (81 mg total) by mouth daily.      Marland Kitchen atorvastatin (LIPITOR) 10 MG tablet Take 1 tablet (10 mg total) by mouth daily at 6 PM.  30 tablet  3  . b complex vitamins tablet Take 1 tablet by mouth daily.      . Calcium Carbonate-Vitamin D (CALCIUM + D PO) Take 1 tablet by mouth 2 (two) times daily.       Marland Kitchen CRANBERRY PO Take 1 tablet by mouth 2 (two) times daily.       . fish oil-omega-3 fatty acids 1000 MG capsule Take 1 g by mouth 2 (two) times daily.       Marland Kitchen lactase (LACTAID) 3000 UNITS tablet Take 1 tablet by mouth daily as needed (only when eating dairy).      Marland Kitchen levothyroxine (SYNTHROID, LEVOTHROID) 75 MCG tablet Take 75 mcg by  mouth daily.        . Loperamide HCl (LOPERAMIDE A-D PO) Take 1 tablet by mouth 4 (four) times daily as needed (diarrhea).       . metoprolol (LOPRESSOR) 50 MG tablet Take 50 mg by mouth as directed. 1/2 tablet twice a day      . Multiple Vitamin (MULTIVITAMIN WITH MINERALS) TABS Take 1 tablet by mouth daily.      . Probiotic Product (PROBIOTIC DAILY PO) Take 1 capsule by mouth daily.       . Rivaroxaban (XARELTO) 20 MG TABS Take 20 mg by mouth daily.      . sertraline (ZOLOFT) 25 MG tablet Take 1 tablet (25 mg total) by mouth daily.  30 tablet  5  . traMADol (ULTRAM) 50 MG tablet Take 50 mg by mouth every 6 (six) hours as needed for pain.       No current facility-administered medications for this visit.       Physical Exam: BP 110/67  Pulse 60  Resp 16  Ht 5\' 5"  (1.651 m)  Wt 106 lb (48.081 kg)  BMI 17.64 kg/m2  SpO2 97%  General appearance:  alert, cooperative, appears stated age and no distress Neurologic: intact Heart: regular rate and rhythm, S1, S2 normal, no murmur, click, rub or gallop Lungs: clear to auscultation bilaterally Abdomen: soft, non-tender; bowel sounds normal; no masses,  no organomegaly Extremities: extremities normal, atraumatic, no cyanosis or edema, Homans sign is negative, no sign of DVT, no edema, redness or tenderness in the calves or thighs and Vein harvest site at the ankle and right thigh are healing well Wound: Sternum is stable  Diagnostic Studies & Laboratory data:         Recent Radiology Findings: Dg Chest 2 View  05/07/2013   *RADIOLOGY REPORT*  Clinical Data: Recent aortic valve surgery.  CHEST - 2 VIEW  Comparison: 04/26/2013  Findings: Postoperative changes from prior CABG and valve replacement. There is hyperinflation of the lungs compatible with COPD.  Scarring in the apices, stable.  Heart is borderline in size.  No effusions.  No acute bony abnormality.  No pneumothorax. The  IMPRESSION: Postoperative changes.  Borderline heart size.  COPD/chronic changes.   Original Report Authenticated By: Charlett Nose, M.D.   Dg Swallowing Func-speech Pathology  05/06/2013   Carolan Shiver, CCC-SLP     05/06/2013 11:09 AM Objective Swallowing Evaluation: Modified Barium Swallowing Study   Patient Details  Name: Kayla Chambers MRN: [id number] Date of Birth: November 13, 1927  Today's Date: 05/06/2013 Time: 7829-5621 SLP Time Calculation (min): 20 min  Past Medical History:  Past Medical History  Diagnosis Date  . Hypothyroidism   . 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 Dilated aortic root     a. 03/2013 s/p Bentall procedure with Ao Root replacement (28mm  Valsalva graft) w/ reimplantationof the R and L coronary ostium.  Marland Kitchen Aortic insufficiency     a. 03/2013 s/p  AVR w/ biologic 25mm Magna Ease peric tissue  valve, model 300TFX, ser# [id number].   Marland Kitchen CAD (coronary artery disease)      a. 01/2013 mod calcification w/o sev dzs;  b. 03/2013 CABG x1  VG->RCA @ time of AVR  . Paroxysmal a-fib     a. Dx 01/2013 - placed on xarelto;  b. 03/2013 s/p left sided  MAZE and LA clip @ time of AVR/CABG.  . Peripheral neuropathy   . Intracranial  aneurysm     in late 1970's  . Osteoporosis   . Rheumatic heart disease   . Headache(784.0)     hx migraines  . Anxiety   . Fibromyalgia   . 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 Neoadjuvant RT by Dr. Abelardo Diesel. Surveillance plan: MRI  12/08/11 shows changes in presumed post-op seroma/hematoma with  slight increase in size - follow up MRI 11/18/12 showed same  size. Will continue with q3 month MRI. CT C/A/P annually  alternating with CXR at 6 months.   . Chronic diastolic CHF (congestive heart failure)     a. 01/2013 echo: EF 50-55%.  . Vertigo   . PMR (polymyalgia rheumatica)     Inactive   Past Surgical History:  Past Surgical History  Procedure Laterality Date  . Sarcoma resection  2011  . Abdominal hysterectomy    . Bladder suspension    . Colonoscopy  10/25/2011    Procedure: COLONOSCOPY;  Surgeon: Charolett Bumpers, MD;   Location: [facility];  Service: Endoscopy;  Laterality: N/A;  . Eye surgery Bilateral     cataracts  . Ascending aortic root replacement N/A 03/31/2013    Procedure: ASCENDING AORTIC ROOT REPLACEMENT;  Surgeon: Delight Ovens, MD;  Location: [facility] OR;  Service: Open Heart Surgery;   Laterality: N/A;  . Intraoperative transesophageal echocardiogram N/A 03/31/2013    Procedure: INTRAOPERATIVE TRANSESOPHAGEAL ECHOCARDIOGRAM;   Surgeon: Delight Ovens, MD;  Location: [facility] OR;  Service: Open  Heart Surgery;  Laterality: N/A;  . Maze N/A 03/31/2013    Procedure: MAZE;  Surgeon: Delight Ovens, MD;  Location: [facility]  OR;  Service: Open Heart Surgery;  Laterality: N/A;   HPI:  79 year old female followed by Dr. Ronny Flurry. She has had  known dilatation of her  descending aorta. She presented to the  office with increasing nocturnal dyspnea orthopnea and rapid  heart rate. She was found to be in new onset of atrial  fibrillation and was hospitalized. Followup including  echocardiogram and cardiac catheterization confirms significant  dilatation of the aortic root( 6.2 cm by echo) and moderate to  moderately severe aortic insufficiency. Pt underwent an aortic  root descending aorta and aortic valve replacement on 04/02/13.    Confusion and dysphagia s/p surgery.  MBS 04/06/13: moderate  dysphagia with silent aspiration and recs for dysphagia 3,  honey-thick at D/C.  Pt has been participating in [facility] for  swallowing therapy.  Her husband believes swallow function has  improved. Pt returns today for OPMBS.       Assessment / Plan / Recommendation Clinical Impression  Dysphagia Diagnosis: Within Functional Limits Clinical impression: Pt demonstrates marked improvements in  swallow function with improved bolus propusion, improved  hyolaryngeal mobility, and reliable airway closure,  There was no  penetration nor aspiration, despite taxing of mechanism.   Recommend resumption of regular consistency diet, thin liquids,  continue to take meds with water.  Of note, esophageal scan  revealed retention of solid barium  throughout esophageal column  with slowed emptying.      Treatment Recommendation  No treatment recommended at this time    Diet Recommendation Regular;Thin liquid   Liquid Administration via: Cup;Straw Medication Administration: Whole meds with liquid Supervision: Patient able to self feed    Other  Recommendations     Follow Up Recommendations  None    SLP Swallow Goals  General Previous Swallow Assessment: Dysphagia Diagnosis:  Moderate pharyngeal phase dysphagia Clinical impression: Pt  presents with a moderate pharyngeal dysphagia characterized by a  baseline structural component and exacerbated by generalized  weakness and suspected pharyngeal secretions. The  pt has a curled  epiglottis as well as the appearance of curvature of cervical  spine which impedes epiglottic deflection and laryngeal closure.  This is a baseline difference, but now pt also demonstrates a  sensory delay in swallow initiation, weakness of hyolaryngeal  musculature and base of tongue weakness. These, combined with pts  anatomy, result in a decreased airway protection and moderate  pharyngeal residuals that are silently penetrated/aspirated post  swallow. A chin tuck worsened aspiration in this case. Severity  of penetration is the least with honey thick liqudis with  multiple effortful swallows. For now, recommend a Dys 3  (mechanical soft) diet with honey thick liquids with full  supervision for strategies and precautions. Minimal aspiration  risk with strategies and modified diet. Expect upgrade as pts  strength improves. Will also work on pharyngeal strengthening  exercises.  Diet Prior to this Study: Dysphagia 3 (soft);Honey-thick liquids Temperature Spikes Noted: No Respiratory Status: Room air History of Recent Intubation: No Behavior/Cognition: Alert;Cooperative;Pleasant mood Oral Cavity - Dentition: Adequate natural dentition Oral Motor / Sensory Function: Within functional limits Self-Feeding Abilities: Able to feed self Patient Positioning: Upright in chair Baseline Vocal Quality: Clear Volitional Cough: Strong Volitional Swallow: Able to elicit Anatomy: Other (Comment) (ant curvature of cerv spine) Pharyngeal Secretions: Not observed secondary MBS    Reason for Referral Objectively evaluate swallowing function   Oral Phase Oral Preparation/Oral Phase Oral Phase: WFL   Pharyngeal Phase Pharyngeal Phase Pharyngeal Phase: Within functional limits  Cervical Esophageal Phase    GO    Cervical Esophageal Phase Cervical Esophageal Phase: [facility]    Functional Assessment Tool Used: clinical judgement Functional Limitations: Swallowing Swallow Current Status (O8416): 0 percent impaired, limited or   restricted Swallow Goal Status (S0630): 0 percent impaired, limited or  restricted Swallow Discharge Status [phone]): 0 percent impaired, limited or  restricted    Blenda Mounts Laurice 05/06/2013, 11:08 AM       Recent Labs: Lab Results  Component Value Date   WBC 8.0 04/26/2013   HGB 12.0 04/26/2013   HCT 37.4 04/26/2013   PLT 215 04/26/2013   GLUCOSE 97 04/26/2013   ALT 14 04/21/2013   AST 19 04/21/2013   NA 134* 04/26/2013   K 4.5 04/26/2013   CL 97 04/26/2013   CREATININE 0.81 04/26/2013   BUN 27* 04/26/2013   CO2 30 04/26/2013   TSH 0.908 04/03/2013   INR 1.39 04/10/2013   HGBA1C 5.1 03/26/2013      Assessment / Plan:   Patient is making slow but steady improvement appropriate for the magnitude of her operation at her age. She appears to be maintaining sinus rhythm at this point Has no overt symptoms of congestive heart failure Is still having home physical therapy Recently past her swallowing evaluation and is advancing her dysphagia diet   Plan to see her back in 6 weeks       Aniketh Huberty B 05/07/2013 1:55 PM

## 2013-05-26 ENCOUNTER — Encounter: Payer: Self-pay | Admitting: Cardiology

## 2013-05-26 ENCOUNTER — Ambulatory Visit (INDEPENDENT_AMBULATORY_CARE_PROVIDER_SITE_OTHER): Payer: Medicare Other | Admitting: Cardiology

## 2013-05-26 VITALS — BP 120/66 | HR 60 | Ht 65.0 in | Wt 114.1 lb

## 2013-05-26 DIAGNOSIS — E039 Hypothyroidism, unspecified: Secondary | ICD-10-CM

## 2013-05-26 DIAGNOSIS — R5383 Other fatigue: Secondary | ICD-10-CM | POA: Insufficient documentation

## 2013-05-26 DIAGNOSIS — I4891 Unspecified atrial fibrillation: Secondary | ICD-10-CM

## 2013-05-26 DIAGNOSIS — E7800 Pure hypercholesterolemia, unspecified: Secondary | ICD-10-CM

## 2013-05-26 DIAGNOSIS — I10 Essential (primary) hypertension: Secondary | ICD-10-CM

## 2013-05-26 DIAGNOSIS — R5381 Other malaise: Secondary | ICD-10-CM

## 2013-05-26 DIAGNOSIS — Z79899 Other long term (current) drug therapy: Secondary | ICD-10-CM

## 2013-05-26 DIAGNOSIS — I5033 Acute on chronic diastolic (congestive) heart failure: Secondary | ICD-10-CM

## 2013-05-26 NOTE — Assessment & Plan Note (Signed)
Blood pressure was remaining stable on current medication.  She is not having any symptoms of dizziness or orthostatic hypotension now.  Last time she arrived in a wheelchair and today she walked in to the office under her own power.

## 2013-05-26 NOTE — Patient Instructions (Signed)
Your physician recommends that you continue on your current medications as directed. Please refer to the Current Medication list given to you today.  Your physician recommends that you schedule a follow-up appointment in: 2 month ov/ekg/cbc/tsh/free t4/lp/bmet/hfp

## 2013-05-26 NOTE — Assessment & Plan Note (Signed)
The patient has a past history of paroxysmal atrial fibrillation.  Today she is in normal sinus rhythm with a pulse of 60.  We will plan to continue her on her current dose of amiodarone and metoprolol

## 2013-05-26 NOTE — Assessment & Plan Note (Signed)
The patient is not having symptoms of CHF.  No significant edema now

## 2013-05-26 NOTE — Progress Notes (Signed)
Kayla Chambers Date of Birth:  01-02-1927 [facility] [address] [place], [place]  [address] [phone]         Fax   [phone]  History of Present Illness: This pleasant 79 year old woman is seen for a post office visit. She underwent a Bentall procedure by Dr. Tyrone Sage on 03/31/13. This involved replacing her  ascending aorta, insertion of a bioprosthetic pericardial tissue valve for her aortic valve, as well as single-vessel CABG and a Maze procedure. Preoperatively she had been in and out of atrial fibrillation. When she initially went home from the hospital she was in sinus rhythm a short while and then has been back in atrial fibrillation. She has been anticoagulated with Xarelto the entire time. Overall she remains weak but is gaining strength.  She has improved with a trial of sertraline for mild postoperative depression. Current Outpatient Prescriptions  Medication Sig Dispense Refill  . ALPRAZolam (XANAX) 0.5 MG tablet Take 0.5 mg by mouth at bedtime as needed for sleep or anxiety.      Marland Kitchen amiodarone (PACERONE) 200 MG tablet Take 200 mg by mouth daily.      . Ascorbic Acid (VITAMIN C PO) Take 1 tablet by mouth daily.      Marland Kitchen aspirin 81 MG chewable tablet Chew 1 tablet (81 mg total) by mouth daily.      Marland Kitchen atorvastatin (LIPITOR) 10 MG tablet Take 1 tablet (10 mg total) by mouth daily at 6 PM.  30 tablet  3  . b complex vitamins tablet Take 1 tablet by mouth daily.      . Calcium Carbonate-Vitamin D (CALCIUM + D PO) Take 1 tablet by mouth 2 (two) times daily.       Marland Kitchen CRANBERRY PO Take 1 tablet by mouth 2 (two) times daily.       . fish oil-omega-3 fatty acids 1000 MG capsule Take 1 g by mouth 2 (two) times daily.       Marland Kitchen lactase (LACTAID) 3000 UNITS tablet Take 1 tablet by mouth daily as needed (only when eating dairy).      Marland Kitchen levothyroxine (SYNTHROID, LEVOTHROID) 75 MCG tablet Take 75 mcg by mouth daily.        . Loperamide HCl (LOPERAMIDE A-D PO) Take 1  tablet by mouth 4 (four) times daily as needed (diarrhea).       . metoprolol (LOPRESSOR) 50 MG tablet Take 50 mg by mouth as directed. 1/2 tablet twice a day      . Multiple Vitamin (MULTIVITAMIN WITH MINERALS) TABS Take 1 tablet by mouth daily.      . Probiotic Product (PROBIOTIC DAILY PO) Take 1 capsule by mouth daily.       . Rivaroxaban (XARELTO) 20 MG TABS Take 20 mg by mouth daily.      . sertraline (ZOLOFT) 25 MG tablet Take 1 tablet (25 mg total) by mouth daily.  30 tablet  5  . traMADol (ULTRAM) 50 MG tablet Take 50 mg by mouth every 6 (six) hours as needed for pain.       No current facility-administered medications for this visit.    Allergies  Allergen Reactions  . Alendronate Sodium Nausea And Vomiting  . Ambien (Zolpidem Tartrate)     hallucinations  . Lyrica (Pregabalin) Nausea And Vomiting  . Milk-Related Compounds Diarrhea  . Neurontin (Gabapentin) Nausea And Vomiting  . Procaine Hcl Other (See Comments)    Reaction unknown  . Toprol Xl (Metoprolol Succinate) Other (  See Comments)    Does not remember. Tolerates Lopressor 04/10/13, [name]    Patient Active Problem List   Diagnosis Date Noted  . Peripheral neuropathy 03/07/2011    Priority: High  . Aortic insufficiency 03/06/2011    Priority: Medium  . 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 PAF (paroxysmal atrial fibrillation) 04/08/2013  . HTN (hypertension) 04/08/2013  . Chronic diastolic CHF (congestive heart failure) 04/08/2013  . CAD (coronary artery disease) 04/08/2013  . Atrial fibrillation 01/31/2013  . Acute on chronic diastolic heart failure 01/31/2013  . Disorder of liver 10/07/2012  . Solitary pulmonary nodule 10/07/2012  . 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 Elevated blood pressure 09/12/2011  . Dizziness 08/09/2011  . Dilated aortic root 03/06/2011  . Hypothyroidism 03/06/2011  . Polymyalgia rheumatica 03/06/2011    History  Smoking status  . Never Smoker     Smokeless tobacco  . Never Used    History  Alcohol Use No    Family History  Problem Relation Age of Onset  . Heart disease Mother   . Stroke Mother   . Arthritis Father   . Cancer Father   . Hypertension Sister   . Anesthesia problems Brother     Review of Systems: Constitutional: no fever chills diaphoresis or fatigue or change in weight.  Head and neck: no hearing loss, no epistaxis, no photophobia or visual disturbance. Respiratory: No cough, shortness of breath or wheezing. Cardiovascular: No chest pain peripheral edema, palpitations. Gastrointestinal: No abdominal distention, no abdominal pain, no change in bowel habits hematochezia or melena. Genitourinary: No dysuria, no frequency, no urgency, no nocturia. Musculoskeletal:No arthralgias, no back pain, no gait disturbance or myalgias. Neurological: No dizziness, no headaches, no numbness, no seizures, no syncope, no weakness, no tremors. Hematologic: No lymphadenopathy, no easy bruising. Psychiatric: No confusion, no hallucinations, no sleep disturbance.    Physical Exam: Filed Vitals:   05/26/13 1435  BP: 120/66  Pulse: 60   the general appearance reveals a well-developed thin woman in no distress.The head and neck exam reveals pupils equal and reactive.  Extraocular movements are full.  There is no scleral icterus.  The mouth and pharynx are normal.  The neck is supple.  The carotids reveal no bruits.  The jugular venous pressure is normal.  The  thyroid is not enlarged.  There is no lymphadenopathy.  The chest is clear to percussion and auscultation.  There are no rales or rhonchi.  Expansion of the chest is symmetrical.  The precordium is quiet.  The first heart sound is normal.  The second heart sound is physiologically split.  There is no diastolic murmur.  The aortic closure sound is crisp.  There is no abnormal lift or heave.  The abdomen is soft and nontender.  The bowel sounds are normal.  The liver and spleen  are not enlarged.  There are no abdominal masses.  There are no abdominal bruits.  Extremities reveal good pedal pulses.  There is no phlebitis or edema.  There is no cyanosis or clubbing.  Strength is normal and symmetrical in all extremities.  There is no lateralizing weakness.  There are no sensory deficits.  The skin is warm and dry.  There is no rash. EKG shows normal sinus rhythm with nonspecific lateral ST-T wave changes and she has voltage for LVH and there is left axis deviation.    Assessment / Plan: The patient is to continue same medication and be rechecked  in 2 months for followup office visit and EKG.  We will also plan to get CBC, TSH, free T4, lipid panel, hepatic function panel, and basal metabolic panel.  Consider reducing amiodarone further at next visit.

## 2013-05-26 NOTE — Assessment & Plan Note (Signed)
The patient's strength and sense of well-being is improved.  Last week they were on vacation at [facility] and she went swimming and enjoyed it.  Her appetite has improved over her weight is unchanged at 114.  She appears to be more upbeat today

## 2013-06-10 ENCOUNTER — Telehealth: Payer: Self-pay | Admitting: Cardiology

## 2013-06-10 NOTE — Telephone Encounter (Signed)
This morning 147/68 (not 130/68) without medications per husband. After meds this morning117/65 HR 59. Yesterday her blood pressure was high at [place] and home so her husband gave her a Lisinopril 2.5 mg (recently that was dc). Husband is concerned since it is going up. Will forward to  Dr. Patty Sermons for review

## 2013-06-10 NOTE — Telephone Encounter (Signed)
Spoke with patient and she felt blood pressure was up yesterday secondary to increased stress yesterday. Patient would prefer to monitor blood pressure and call back Friday with update. Did advise ok to take a Lisinopril if blood pressure is up. Patient verbalized understanding.

## 2013-06-10 NOTE — Telephone Encounter (Signed)
New prob  Pt husband states her BP has been increasing over the past few days.  They are as follows: Friday 118/61 Sat 121/64 Sun 134/70 Mon 130/68 Tues 144/76 This morning 130/68.  He would like to speak with you when you get a chance.

## 2013-06-10 NOTE — Telephone Encounter (Signed)
Since blood pressure is trending back up restart lisinopril 2.5 mg  daily

## 2013-06-12 ENCOUNTER — Ambulatory Visit: Payer: Medicare Other | Admitting: Cardiology

## 2013-06-12 NOTE — Telephone Encounter (Signed)
Follow up   Per husband her BP has stabilized and he believes everything is ok

## 2013-06-12 NOTE — Telephone Encounter (Signed)
Will continue current regimen, left message

## 2013-06-17 ENCOUNTER — Emergency Department (HOSPITAL_COMMUNITY)
Admission: EM | Admit: 2013-06-17 | Discharge: 2013-06-17 | Disposition: A | Discharge status: Home / Self Care | Payer: Medicare Other | Source: Home / Self Care | Attending: Family Medicine | Admitting: Family Medicine

## 2013-06-17 ENCOUNTER — Ambulatory Visit (HOSPITAL_COMMUNITY): Payer: Medicare Other

## 2013-06-17 ENCOUNTER — Telehealth: Payer: Self-pay | Admitting: Cardiology

## 2013-06-17 ENCOUNTER — Ambulatory Visit (HOSPITAL_COMMUNITY)
Admit: 2013-06-17 | Discharge: 2013-06-17 | Disposition: A | Discharge status: Home / Self Care | Payer: Medicare Other | Attending: Family Medicine | Admitting: Family Medicine

## 2013-06-17 ENCOUNTER — Encounter (HOSPITAL_COMMUNITY): Payer: Self-pay | Admitting: *Deleted

## 2013-06-17 DIAGNOSIS — M79609 Pain in unspecified limb: Secondary | ICD-10-CM

## 2013-06-17 DIAGNOSIS — I2510 Atherosclerotic heart disease of native coronary artery without angina pectoris: Secondary | ICD-10-CM | POA: Insufficient documentation

## 2013-06-17 DIAGNOSIS — M7989 Other specified soft tissue disorders: Secondary | ICD-10-CM | POA: Insufficient documentation

## 2013-06-17 DIAGNOSIS — R609 Edema, unspecified: Secondary | ICD-10-CM

## 2013-06-17 DIAGNOSIS — S8011XA Contusion of right lower leg, initial encounter: Secondary | ICD-10-CM

## 2013-06-17 DIAGNOSIS — Z951 Presence of aortocoronary bypass graft: Secondary | ICD-10-CM | POA: Insufficient documentation

## 2013-06-17 DIAGNOSIS — S8010XA Contusion of unspecified lower leg, initial encounter: Secondary | ICD-10-CM

## 2013-06-17 DIAGNOSIS — R600 Localized edema: Secondary | ICD-10-CM

## 2013-06-17 MED ORDER — HYDROCODONE-ACETAMINOPHEN 5-325 MG PO TABS: 1.0000 | ORAL_TABLET | Freq: Three times a day (TID) | ORAL | Status: DC | PRN

## 2013-06-17 NOTE — ED Notes (Signed)
Pt taken to X ray per w/c and shuttle

## 2013-06-17 NOTE — Telephone Encounter (Signed)
New prob  Pt husband would like to speak with you regarding his wife. He would not say what it was about.

## 2013-06-17 NOTE — Telephone Encounter (Signed)
Spoke with patients husband and patient had an MRI about a week ago. While getting into the machine she bumped her foot and has been  painful and swollen. Discussed with  Dr. Patty Sermons, recommending going to urgent care for evaluation. Advised husband, verbalized understanding.

## 2013-06-17 NOTE — ED Provider Notes (Signed)
CSN: [id number]     Arrival date & time 06/17/13  1437 History     First MD Initiated Contact with Patient 06/17/13 1504     Chief Complaint  Patient presents with  . Leg Pain   (Consider location/radiation/quality/duration/timing/severity/associated sxs/prior Treatment) HPI Comments: 79 year old female with complex medical history including hypothyroidism, soft tissue sarcoma, CAD s/p CABG and affib, aortic valve replacement on chronic anticoagulation with xarelto and asa, among other co morbidities. Here with husband c/o 1 week of tenderness in her right lower leg ankle and foot after she hit her leg above the ankle against an MRI table during a follow up MRI of the left leg sarcoma surgery site on 06/09/13. Has been able to ambulate but reports pain with walking foot and ankle progressively swollen. As per husband patient has been less ambulatory due to right foot/ankle pain and swelling. Otherwise feeling well denies SOB or chest pain.    Past Medical History  Diagnosis Date  . Hypothyroidism   . 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 Dilated aortic root     a. 03/2013 s/p Bentall procedure with Ao Root replacement (28mm Valsalva graft) w/ reimplantationof the R and L coronary ostium.  Marland Kitchen Aortic insufficiency     a. 03/2013 s/p  AVR w/ biologic 25mm Magna Ease peric tissue valve, model 300TFX, ser# [id number].   Marland Kitchen CAD (coronary artery disease)     a. 01/2013 mod calcification w/o sev dzs;  b. 03/2013 CABG x1 VG->RCA @ time of AVR  . Paroxysmal a-fib     a. Dx 01/2013 - placed on xarelto;  b. 03/2013 s/p left sided MAZE and LA clip @ time of AVR/CABG.  . Peripheral neuropathy   . Intracranial aneurysm     in late 1970's  . Osteoporosis   . Rheumatic heart disease   . Headache(784.0)     hx migraines  . Anxiety   . Fibromyalgia   . 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 Neoadjuvant RT by Dr. Abelardo Diesel. Surveillance plan: MRI 12/08/11 shows changes in presumed post-op seroma/hematoma with slight increase in size - follow up MRI 11/18/12 showed same size. Will continue with q3 month MRI. CT C/A/P annually alternating with CXR at 6 months.   . Chronic diastolic CHF (congestive heart failure)     a. 01/2013 echo: EF 50-55%.  . Vertigo   . PMR (polymyalgia rheumatica)     Inactive   Past Surgical History  Procedure Laterality Date  . Sarcoma resection  2011  . Abdominal hysterectomy    . Bladder suspension    . Colonoscopy  10/25/2011    Procedure: COLONOSCOPY;  Surgeon: Charolett Bumpers, MD;  Location: [facility];  Service: Endoscopy;  Laterality: N/A;  . Eye surgery Bilateral     cataracts  . Ascending aortic root replacement N/A 03/31/2013    Procedure: ASCENDING AORTIC ROOT REPLACEMENT;  Surgeon: Delight Ovens, MD;  Location: [facility] OR;  Service: Open Heart Surgery;  Laterality: N/A;  . Intraoperative transesophageal echocardiogram N/A 03/31/2013    Procedure: INTRAOPERATIVE TRANSESOPHAGEAL ECHOCARDIOGRAM;  Surgeon: Delight Ovens, MD;  Location: [facility] OR;  Service: Open Heart Surgery;  Laterality: N/A;  . Maze N/A 03/31/2013    Procedure: MAZE;  Surgeon: Delight Ovens, MD;  Location: [facility] OR;  Service: Open Heart Surgery;  Laterality: N/A;  Family History  Problem Relation Age of Onset  . Heart disease Mother   . Stroke Mother   . Arthritis Father   . Cancer Father   . Hypertension Sister   . Anesthesia problems Brother    History  Substance Use Topics  . Smoking status: Never Smoker   . Smokeless tobacco: Never Used  . Alcohol Use: No   OB History   Grav Para Term Preterm Abortions TAB SAB Ect Mult Living                 Review of Systems  Constitutional: Negative for fever, chills, diaphoresis and appetite change.  Respiratory: Negative for cough and shortness of breath.   Cardiovascular: Negative for chest pain.   Gastrointestinal: Negative for nausea, vomiting and abdominal pain.  Musculoskeletal: Positive for joint swelling.       Right lower leg pain bruising and swelling as per HPI.   Skin: Negative for wound.  Neurological: Negative for weakness and numbness.  All other systems reviewed and are negative.    Allergies  Alendronate sodium; Ambien; Lyrica; Milk-related compounds; Neurontin; Procaine hcl; and Toprol xl  Home Medications   Current Outpatient Rx  Name  Route  Sig  Dispense  Refill  . ALPRAZolam (XANAX) 0.5 MG tablet   Oral   Take 0.5 mg by mouth at bedtime as needed for sleep or anxiety.         Marland Kitchen amiodarone (PACERONE) 200 MG tablet   Oral   Take 200 mg by mouth daily.         . Ascorbic Acid (VITAMIN C PO)   Oral   Take 1 tablet by mouth daily.         Marland Kitchen atorvastatin (LIPITOR) 10 MG tablet   Oral   Take 1 tablet (10 mg total) by mouth daily at 6 PM.   30 tablet   3   . b complex vitamins tablet   Oral   Take 1 tablet by mouth daily.         . Calcium Carbonate-Vitamin D (CALCIUM + D PO)   Oral   Take 1 tablet by mouth 2 (two) times daily.          Marland Kitchen CRANBERRY PO   Oral   Take 1 tablet by mouth 2 (two) times daily.          Marland Kitchen lactase (LACTAID) 3000 UNITS tablet   Oral   Take 1 tablet by mouth daily as needed (only when eating dairy).         Marland Kitchen levothyroxine (SYNTHROID, LEVOTHROID) 75 MCG tablet   Oral   Take 75 mcg by mouth daily.           . metoprolol (LOPRESSOR) 50 MG tablet   Oral   Take 50 mg by mouth as directed. 1/2 tablet twice a day         . Multiple Vitamin (MULTIVITAMIN WITH MINERALS) TABS   Oral   Take 1 tablet by mouth daily.         . Rivaroxaban (XARELTO) 20 MG TABS   Oral   Take 20 mg by mouth daily.         . sertraline (ZOLOFT) 25 MG tablet   Oral   Take 1 tablet (25 mg total) by mouth daily.   30 tablet   5   . aspirin 81 MG chewable tablet   Oral   Chew 1 tablet (81 mg total) by mouth daily.          Marland Kitchen  fish oil-omega-3 fatty acids 1000 MG capsule   Oral   Take 1 g by mouth 2 (two) times daily.          Marland Kitchen HYDROcodone-acetaminophen (NORCO/VICODIN) 5-325 MG per tablet   Oral   Take 1 tablet by mouth every 8 (eight) hours as needed for pain.   10 tablet   0   . Loperamide HCl (LOPERAMIDE A-D PO)   Oral   Take 1 tablet by mouth 4 (four) times daily as needed (diarrhea).          . Probiotic Product (PROBIOTIC DAILY PO)   Oral   Take 1 capsule by mouth daily.          . traMADol (ULTRAM) 50 MG tablet   Oral   Take 50 mg by mouth every 6 (six) hours as needed for pain.          BP 124/62  Pulse 52  Temp(Src) 97.5 F (36.4 C) (Oral)  Resp 16  SpO2 99% Physical Exam  Nursing note and vitals reviewed. Constitutional: She is oriented to person, place, and time. She appears well-developed and well-nourished. No distress.  Neck: No JVD present.  Cardiovascular: Normal rate and regular rhythm.  Exam reveals no gallop and no friction rub.   SEM and crisp second sound. Rate is regular   Pulmonary/Chest: Effort normal and breath sounds normal. No respiratory distress. She has no wheezes. She has no rales.  Musculoskeletal:  Right leg: there is an area of induration and bruising in distal and lateral about 5 cm above lateral malleoli. There is associated mild to moderate swelling extending down to ankle and dorsolateral foot. There is a line of fading migrating bruising from described bruise above the ankle fading down to dorsum of foot and extending to lateral and medial area of 3rd and forth toes. No cyanosis or devitalized skin at the tip of toes. Temperature in both feet feels similar. Dorsal pedal and tibial posterior pulses feel faint due to edema but present. No striking erythema or focal increased temperature. Incidental: Patient has a hematoma in healing stages at her right mid thigh from recent cardiovascular surgery site in 03/2013.   Neurological: She is alert  and oriented to person, place, and time.  Skin: She is not diaphoretic.  No wounds, increased temp or striking erythema.   Psychiatric: She has a normal mood and affect. Her behavior is normal. Judgment and thought content normal.    ED Course   Procedures (including critical care time)  Labs Reviewed - No data to display Dg Tibia/fibula Right  06/17/2013   *RADIOLOGY REPORT*  Clinical Data: Pain post blunt trauma.  RIGHT TIBIA AND FIBULA - 2 VIEW  Comparison: None.  Findings: Vascular clips medial to the distal femur.  Mild osteopenia.  Negative for fracture, dislocation, or other acute bony abnormality.  IMPRESSION:  No acute abnormality.   Original Report Authenticated By: D. Andria Rhein, MD   Dg Ankle Complete Right  06/17/2013   *RADIOLOGY REPORT*  Clinical Data: Pain post blunt trauma  RIGHT ANKLE - COMPLETE 3+ VIEW  Comparison: None.  Findings: Ankle mortise intact. Negative for fracture, dislocation, or other acute abnormality.  Normal alignment and mineralization. No significant degenerative change.  Regional soft tissues unremarkable.  IMPRESSION:  Negative   Original Report Authenticated By: D. Andria Rhein, MD   1. Contusion of lower leg, right, initial encounter   2. Edema of right lower extremity     MDM  No  evidence of DVT on venous doppler US performed today. My impression is impending edema from immobilization due to pain caused by contusion, there is also a hematoma over the contused area likely contributed by xarelto/asa.  As per pt. request prescribed hydrocodone/acetaminophen after discussing the risks for falling while taking narcotics specially at her age. #10 tablets prescribed; patient and wife agree to use plain tylenol during day time and take norco for moderate to severe discomfort or at night time if needed. An ACE wrap was applied today we discussed low extremity venous pump exercises and leg elevation. Instructions for wrap self placement and removal discussed  with patient and husband and provided in writing.  Reccommended follow up with Dr. Wynelle Link in the next 3-5 days to monitor symptoms. Supportive care and red flags that should prompt return to medical attention discussed with patient and her husband and provided in writing.    Sharin Grave, MD 06/18/13 [phone]

## 2013-06-17 NOTE — Progress Notes (Signed)
VASCULAR LAB PRELIMINARY  PRELIMINARY  PRELIMINARY  PRELIMINARY  Right lower extremity venous duplex completed.    Preliminary report:  Right leg is negative for deep and superficial vein thrombosis. Incidental finding of echoic structure in the medial distal thigh along the incision from prior vein harvest. Measures approximately 3cm X 3cm.   Report called to Dr. Alfonse Ras.  Patient sent to Radiology.  Kayla Chambers, RVT 06/17/2013, 5:31 PM

## 2013-06-17 NOTE — ED Notes (Signed)
Pt reports she had an MRI 06/09/2013 and she hit her right leg on the table.  She has had pain of her lower leg, ankle and foot.  She is able to ambulate, but it is painful

## 2013-06-18 ENCOUNTER — Ambulatory Visit: Payer: Self-pay | Admitting: Cardiothoracic Surgery

## 2013-06-25 ENCOUNTER — Encounter: Payer: Self-pay | Admitting: Cardiothoracic Surgery

## 2013-06-25 ENCOUNTER — Ambulatory Visit (INDEPENDENT_AMBULATORY_CARE_PROVIDER_SITE_OTHER): Payer: Self-pay | Admitting: Cardiothoracic Surgery

## 2013-06-25 VITALS — BP 107/64 | HR 60 | Resp 20 | Ht 65.0 in | Wt 114.0 lb

## 2013-06-25 DIAGNOSIS — Z951 Presence of aortocoronary bypass graft: Secondary | ICD-10-CM

## 2013-06-25 DIAGNOSIS — Z09 Encounter for follow-up examination after completed treatment for conditions other than malignant neoplasm: Secondary | ICD-10-CM

## 2013-06-25 DIAGNOSIS — I359 Nonrheumatic aortic valve disorder, unspecified: Secondary | ICD-10-CM

## 2013-06-25 DIAGNOSIS — I77810 Thoracic aortic ectasia: Secondary | ICD-10-CM

## 2013-06-25 DIAGNOSIS — Z8679 Personal history of other diseases of the circulatory system: Secondary | ICD-10-CM

## 2013-06-25 DIAGNOSIS — Z9889 Other specified postprocedural states: Secondary | ICD-10-CM

## 2013-06-25 DIAGNOSIS — I351 Nonrheumatic aortic (valve) insufficiency: Secondary | ICD-10-CM

## 2013-06-25 DIAGNOSIS — I2510 Atherosclerotic heart disease of native coronary artery without angina pectoris: Secondary | ICD-10-CM

## 2013-06-25 NOTE — Progress Notes (Signed)
[address]       [place] [address]             [phone]                  Kayla Chambers [facility] Medical Record #[id number] Date of Birth: [birth year]  Kayla Rubenstein, MD Kayla Rubenstein, MD  Chief Complaint:   PostOp Follow Up Visit 03/31/2013  PREOPERATIVE DIAGNOSIS: Dilated aortic root to 6.2 cm and moderate-to-  severe aortic insufficiency.  POSTOPERATIVE DIAGNOSIS: Dilated aortic root to 6.2 cm and moderate-to-  severe aortic insufficiency.  PROCEDURE PERFORMED: Bentall procedure with replacement of aortic root  with a 28-mm Valsalva graft with a biologic 25-mm Magna Ease valve,  pericardial tissue valve, model 300TFX, serial number [id number] with  reimplantation of the right and left coronary ostium. Left sided MAZE  procedure and placement of left atrial clip and coronary artery bypass  grafting x1 with reverse saphenous vein graft   History of Present Illness:      Patient is now approximately 3 month postop he is in reasonable progress at home,  She has no overt symptoms of congestive heart failure. Her biggest complaint today is that while getting an MRI scan of her extremity she hit her right ankle on the machine while on blood thinner and now has a swollen tender right foot. A plain film of the right ankle showed no fracture. She has been trying to keep the foot elevated. Otherwise she is making good progress in increasing her activity appropriately.  History  Smoking status  . Never Smoker   Smokeless tobacco  . Never Used       Allergies  Allergen Reactions  . Alendronate Sodium Nausea And Vomiting  . Ambien (Zolpidem Tartrate)     hallucinations  . Lyrica (Pregabalin) Nausea And Vomiting  . Milk-Related Compounds Diarrhea  . Neurontin (Gabapentin) Nausea And Vomiting  . Procaine Hcl Other (See Comments)    Reaction unknown  . Toprol Xl (Metoprolol Succinate) Other (See Comments)    Does not remember. Tolerates Lopressor  04/10/13, Kayla Chambers    Current Outpatient Prescriptions  Medication Sig Dispense Refill  . ALPRAZolam (XANAX) 0.5 MG tablet Take 0.5 mg by mouth at bedtime as needed for sleep or anxiety.      Marland Kitchen amiodarone (PACERONE) 200 MG tablet Take 200 mg by mouth daily.      . Ascorbic Acid (VITAMIN C PO) Take 1 tablet by mouth daily.      Marland Kitchen aspirin 81 MG chewable tablet Chew 1 tablet (81 mg total) by mouth daily.      Marland Kitchen atorvastatin (LIPITOR) 10 MG tablet Take 1 tablet (10 mg total) by mouth daily at 6 PM.  30 tablet  3  . b complex vitamins tablet Take 1 tablet by mouth daily.      . Calcium Carbonate-Vitamin D (CALCIUM + D PO) Take 1 tablet by mouth 2 (two) times daily.       Marland Kitchen CRANBERRY PO Take 1 tablet by mouth 2 (two) times daily.       . fish oil-omega-3 fatty acids 1000 MG capsule Take 1 g by mouth 2 (two) times daily.       Marland Kitchen HYDROcodone-acetaminophen (NORCO/VICODIN) 5-325 MG per tablet Take 1 tablet by mouth every 8 (eight) hours as needed for pain.  10 tablet  0  . lactase (LACTAID) 3000 UNITS tablet Take 1 tablet by mouth daily as needed (  only when eating dairy).      Marland Kitchen levothyroxine (SYNTHROID, LEVOTHROID) 75 MCG tablet Take 75 mcg by mouth daily.        . Loperamide HCl (LOPERAMIDE A-D PO) Take 1 tablet by mouth 4 (four) times daily as needed (diarrhea).       . metoprolol (LOPRESSOR) 50 MG tablet Take 50 mg by mouth as directed. 1/2 tablet twice a day      . Multiple Vitamin (MULTIVITAMIN WITH MINERALS) TABS Take 1 tablet by mouth daily.      . Probiotic Product (PROBIOTIC DAILY PO) Take 1 capsule by mouth daily.       . Rivaroxaban (XARELTO) 20 MG TABS Take 20 mg by mouth daily.      . sertraline (ZOLOFT) 25 MG tablet Take 1 tablet (25 mg total) by mouth daily.  30 tablet  5  . traMADol (ULTRAM) 50 MG tablet Take 50 mg by mouth every 6 (six) hours as needed for pain.       No current facility-administered medications for this visit.       Physical Exam: Ht 5\' 5"  (1.651 m)  Wt 114 lb  (51.71 kg)  BMI 18.97 kg/m2  SpO2 %  General appearance: alert, cooperative, appears stated age and no distress Neurologic: intact Heart: regular rate and rhythm, S1, S2 normal, no murmur, click, rub or gallop Lungs: clear to auscultation bilaterally Abdomen: soft, non-tender; bowel sounds normal; no masses,  no organomegaly Extremities: extremities normal, atraumatic, no cyanosis or edema, Homans sign is negative, no sign of DVT, no edema, redness or tenderness in the calves or thighs and Vein harvest site at the ankle and right thigh are healing well, the right ankle and dorsum of the foot are swollen and bruised and along the right lateral aspect of the lower leg is bruised. Wound: Sternum is stable  Diagnostic Studies & Laboratory data:         Recent Radiology Findings:  Dg Chest 2 View  05/07/2013   *RADIOLOGY REPORT*  Clinical Data: Recent aortic valve surgery.  CHEST - 2 VIEW  Comparison: 04/26/2013  Findings: Postoperative changes from prior CABG and valve replacement. There is hyperinflation of the lungs compatible with COPD.  Scarring in the apices, stable.  Heart is borderline in size.  No effusions.  No acute bony abnormality.  No pneumothorax. The  IMPRESSION: Postoperative changes.  Borderline heart size.  COPD/chronic changes.   Original Report Authenticated By: Charlett Nose, M.D.   Dg Swallowing Func-speech Pathology  05/06/2013   Kayla Chambers, CCC-SLP     05/06/2013 11:09 AM Objective Swallowing Evaluation: Modified Barium Swallowing Study   Patient Details  Name: Kayla Chambers MRN: [id number] Date of Birth: October 29, 1927  Today's Date: 05/06/2013 Time: 7829-5621 SLP Time Calculation (min): 20 min  Past Medical History:  Past Medical History  Diagnosis Date  . Hypothyroidism   . 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 Dilated aortic root     a. 03/2013 s/p Bentall procedure with Ao Root replacement (28mm  Valsalva graft) w/ reimplantationof  the R and L coronary ostium.  Marland Kitchen Aortic insufficiency     a. 03/2013 s/p  AVR w/ biologic 25mm Magna Ease peric tissue  valve, model 300TFX, ser# [id number].   Marland Kitchen CAD (coronary artery disease)     a. 01/2013 mod calcification w/o sev dzs;  b. 03/2013 CABG x1  VG->RCA @ time of AVR  .  Paroxysmal a-fib     a. Dx 01/2013 - placed on xarelto;  b. 03/2013 s/p left sided  MAZE and LA clip @ time of AVR/CABG.  . Peripheral neuropathy   . Intracranial aneurysm     in late 1970's  . Osteoporosis   . Rheumatic heart disease   . Headache(784.0)     hx migraines  . Anxiety   . Fibromyalgia   . 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 Neoadjuvant RT by Dr. Abelardo Diesel. Surveillance plan: MRI  12/08/11 shows changes in presumed post-op seroma/hematoma with  slight increase in size - follow up MRI 11/18/12 showed same  size. Will continue with q3 month MRI. CT C/A/P annually  alternating with CXR at 6 months.   . Chronic diastolic CHF (congestive heart failure)     a. 01/2013 echo: EF 50-55%.  . Vertigo   . PMR (polymyalgia rheumatica)     Inactive   Past Surgical History:  Past Surgical History  Procedure Laterality Date  . Sarcoma resection  2011  . Abdominal hysterectomy    . Bladder suspension    . Colonoscopy  10/25/2011    Procedure: COLONOSCOPY;  Surgeon: Charolett Bumpers, MD;   Location: [facility];  Service: Endoscopy;  Laterality: N/A;  . Eye surgery Bilateral     cataracts  . Ascending aortic root replacement N/A 03/31/2013    Procedure: ASCENDING AORTIC ROOT REPLACEMENT;  Surgeon: Delight Ovens, MD;  Location: [facility] OR;  Service: Open Heart Surgery;   Laterality: N/A;  . Intraoperative transesophageal echocardiogram N/A 03/31/2013    Procedure: INTRAOPERATIVE TRANSESOPHAGEAL ECHOCARDIOGRAM;   Surgeon: Delight Ovens, MD;  Location: [facility] OR;  Service: Open  Heart Surgery;  Laterality: N/A;  . Maze N/A 03/31/2013    Procedure: MAZE;  Surgeon:  Delight Ovens, MD;  Location: [facility]  OR;  Service: Open Heart Surgery;  Laterality: N/A;   HPI:  79 year old female followed by Dr. Ronny Flurry. She has had  known dilatation of her descending aorta. She presented to the  office with increasing nocturnal dyspnea orthopnea and rapid  heart rate. She was found to be in new onset of atrial  fibrillation and was hospitalized. Followup including  echocardiogram and cardiac catheterization confirms significant  dilatation of the aortic root( 6.2 cm by echo) and moderate to  moderately severe aortic insufficiency. Pt underwent an aortic  root descending aorta and aortic valve replacement on 04/02/13.    Confusion and dysphagia s/p surgery.  MBS 04/06/13: moderate  dysphagia with silent aspiration and recs for dysphagia 3,  honey-thick at D/C.  Pt has been participating in [facility] for  swallowing therapy.  Her husband believes swallow function has  improved. Pt returns today for OPMBS.       Assessment / Plan / Recommendation Clinical Impression  Dysphagia Diagnosis: Within Functional Limits Clinical impression: Pt demonstrates marked improvements in  swallow function with improved bolus propusion, improved  hyolaryngeal mobility, and reliable airway closure,  There was no  penetration nor aspiration, despite taxing of mechanism.   Recommend resumption of regular consistency diet, thin liquids,  continue to take meds with water.  Of note, esophageal scan  revealed retention of solid barium  throughout esophageal column  with slowed emptying.      Treatment Recommendation  No treatment recommended at this time    Diet Recommendation Regular;Thin liquid   Liquid  Administration via: Cup;Straw Medication Administration: Whole meds with liquid Supervision: Patient able to self feed    Other  Recommendations     Follow Up Recommendations  None    SLP Swallow Goals     General Previous Swallow Assessment: Dysphagia Diagnosis:  Moderate pharyngeal phase dysphagia Clinical  impression: Pt  presents with a moderate pharyngeal dysphagia characterized by a  baseline structural component and exacerbated by generalized  weakness and suspected pharyngeal secretions. The pt has a curled  epiglottis as well as the appearance of curvature of cervical  spine which impedes epiglottic deflection and laryngeal closure.  This is a baseline difference, but now pt also demonstrates a  sensory delay in swallow initiation, weakness of hyolaryngeal  musculature and base of tongue weakness. These, combined with pts  anatomy, result in a decreased airway protection and moderate  pharyngeal residuals that are silently penetrated/aspirated post  swallow. A chin tuck worsened aspiration in this case. Severity  of penetration is the least with honey thick liqudis with  multiple effortful swallows. For now, recommend a Dys 3  (mechanical soft) diet with honey thick liquids with full  supervision for strategies and precautions. Minimal aspiration  risk with strategies and modified diet. Expect upgrade as pts  strength improves. Will also work on pharyngeal strengthening  exercises.  Diet Prior to this Study: Dysphagia 3 (soft);Honey-thick liquids Temperature Spikes Noted: No Respiratory Status: Room air History of Recent Intubation: No Behavior/Cognition: Alert;Cooperative;Pleasant mood Oral Cavity - Dentition: Adequate natural dentition Oral Motor / Sensory Function: Within functional limits Self-Feeding Abilities: Able to feed self Patient Positioning: Upright in chair Baseline Vocal Quality: Clear Volitional Cough: Strong Volitional Swallow: Able to elicit Anatomy: Other (Comment) (ant curvature of cerv spine) Pharyngeal Secretions: Not observed secondary MBS    Reason for Referral Objectively evaluate swallowing function   Oral Phase Oral Preparation/Oral Phase Oral Phase: WFL   Pharyngeal Phase Pharyngeal Phase Pharyngeal Phase: Within functional limits  Cervical Esophageal Phase    GO    Cervical  Esophageal Phase Cervical Esophageal Phase: [facility]    Functional Assessment Tool Used: clinical judgement Functional Limitations: Swallowing Swallow Current Status (W0981): 0 percent impaired, limited or  restricted Swallow Goal Status (X9147): 0 percent impaired, limited or  restricted Swallow Discharge Status [phone]): 0 percent impaired, limited or  restricted    Blenda Mounts Laurice 05/06/2013, 11:08 AM     . Recent Labs: Lab Results  Component Value Date   WBC 8.0 04/26/2013   HGB 12.0 04/26/2013   HCT 37.4 04/26/2013   PLT 215 04/26/2013   GLUCOSE 97 04/26/2013   ALT 14 04/21/2013   AST 19 04/21/2013   NA 134* 04/26/2013   K 4.5 04/26/2013   CL 97 04/26/2013   CREATININE 0.81 04/26/2013   BUN 27* 04/26/2013   CO2 30 04/26/2013   TSH 0.908 04/03/2013   INR 1.39 04/10/2013   HGBA1C 5.1 03/26/2013      Assessment / Plan:   Patient is making slow but steady improvement appropriate for the magnitude of her operation at her age. She appears to be maintaining sinus rhythm at this point I have not made a return appointment to see me but would be glad to see her at cardiology requested anytime.        Yazmin Locher B 06/25/2013 2:24 PM

## 2013-07-14 ENCOUNTER — Other Ambulatory Visit: Payer: Self-pay | Admitting: *Deleted

## 2013-07-14 ENCOUNTER — Other Ambulatory Visit: Payer: Self-pay

## 2013-07-14 MED ORDER — METOPROLOL TARTRATE 50 MG PO TABS: 50.0000 mg | ORAL_TABLET | ORAL | Status: DC

## 2013-07-14 MED ORDER — AMIODARONE HCL 200 MG PO TABS: 200.0000 mg | ORAL_TABLET | Freq: Every day | ORAL | Status: DC

## 2013-07-29 ENCOUNTER — Other Ambulatory Visit: Payer: Medicare Other

## 2013-07-29 ENCOUNTER — Ambulatory Visit (INDEPENDENT_AMBULATORY_CARE_PROVIDER_SITE_OTHER): Payer: Medicare Other | Admitting: Cardiology

## 2013-07-29 VITALS — BP 160/80 | HR 52 | Ht 65.0 in | Wt 123.0 lb

## 2013-07-29 DIAGNOSIS — I480 Paroxysmal atrial fibrillation: Secondary | ICD-10-CM

## 2013-07-29 DIAGNOSIS — R42 Dizziness and giddiness: Secondary | ICD-10-CM

## 2013-07-29 DIAGNOSIS — I2510 Atherosclerotic heart disease of native coronary artery without angina pectoris: Secondary | ICD-10-CM

## 2013-07-29 DIAGNOSIS — I5033 Acute on chronic diastolic (congestive) heart failure: Secondary | ICD-10-CM

## 2013-07-29 DIAGNOSIS — E039 Hypothyroidism, unspecified: Secondary | ICD-10-CM

## 2013-07-29 DIAGNOSIS — E7800 Pure hypercholesterolemia, unspecified: Secondary | ICD-10-CM

## 2013-07-29 DIAGNOSIS — I4891 Unspecified atrial fibrillation: Secondary | ICD-10-CM

## 2013-07-29 DIAGNOSIS — Z9889 Other specified postprocedural states: Secondary | ICD-10-CM

## 2013-07-29 DIAGNOSIS — Z79899 Other long term (current) drug therapy: Secondary | ICD-10-CM

## 2013-07-29 LAB — CBC WITH DIFFERENTIAL/PLATELET
Basophils Absolute: 0 10*3/uL (ref 0.0–0.1)
Hemoglobin: 12.1 g/dL (ref 12.0–15.0)
Lymphocytes Relative: 15.6 % (ref 12.0–46.0)
Monocytes Relative: 9.9 % (ref 3.0–12.0)
Neutrophils Relative %: 71.6 % (ref 43.0–77.0)
Platelets: 173 10*3/uL (ref 150.0–400.0)
RDW: 16.1 % — ABNORMAL HIGH (ref 11.5–14.6)

## 2013-07-29 LAB — TSH: TSH: 1.4 u[IU]/mL (ref 0.35–5.50)

## 2013-07-29 LAB — LIPID PANEL
HDL: 52.5 mg/dL (ref >39.00)
Triglycerides: 89 mg/dL (ref 0.0–149.0)

## 2013-07-29 LAB — HEPATIC FUNCTION PANEL
ALT: 23 U/L (ref 0–35)
AST: 27 U/L (ref 0–37)
Total Bilirubin: 0.6 mg/dL (ref 0.3–1.2)

## 2013-07-29 LAB — BASIC METABOLIC PANEL
Calcium: 8.8 mg/dL (ref 8.4–10.5)
Creatinine, Ser: 0.7 mg/dL (ref 0.4–1.2)
GFR: 89.16 mL/min (ref >60.00)
Glucose, Bld: 79 mg/dL (ref 70–99)
Sodium: 136 mEq/L (ref 135–145)

## 2013-07-29 LAB — T4, FREE: Free T4: 1.38 ng/dL (ref 0.60–1.60)

## 2013-07-29 NOTE — Assessment & Plan Note (Signed)
The patient has not been experiencing any chest pain or angina pectoris. 

## 2013-07-29 NOTE — Progress Notes (Signed)
Quick Note:  Please report to patient. The recent labs are stable. Continue same medication and careful diet. ______ 

## 2013-07-29 NOTE — Patient Instructions (Addendum)
Will obtain labs today and call you with the results (LP/BMET/HFP/CBC/TSH/FREE T4)  DECREASE AMIODARONE TO 1/2 TABLET DAILY  DECREASE ZOLOFT TO 1/2 TABLET DAILY FOR 1 WEEK, THEN 1/2 TABLET EVERY OTHER DAY FOR 1 WEEK, AND THEN DISCONTINUE   Your physician recommends that you schedule a follow-up appointment in: 2 MONTH OV/EKG/TSH/CBC

## 2013-07-29 NOTE — Progress Notes (Signed)
Kayla Chambers Date of Birth:  1927-05-05 [facility] [address] [place], [place]  [address] [phone]         Fax   [phone]  History of Present Illness: This pleasant 79 year old woman is seen for a post office visit. She underwent a Bentall procedure by Dr. Tyrone Sage on 03/31/13. 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 Preoperatively she had been in and out of atrial fibrillation.  More recently she has been maintaining normal sinus rhythm.  She has had some dizziness which may be secondary to her amiodarone.  She has decided not to do the cardiac rehabilitation program but plans to exercise at the [facility].  Her postoperative depression has improved and her husband thinks that she can be tapered off the Zoloft now.  She agrees.   Current Outpatient Prescriptions  Medication Sig Dispense Refill  . ALPRAZolam (XANAX) 0.5 MG tablet Take 0.5 mg by mouth at bedtime as needed for sleep or anxiety.      Marland Kitchen amiodarone (PACERONE) 200 MG tablet Take 200 mg by mouth as directed. 1/2 TABLET DAILY      . Ascorbic Acid (VITAMIN C PO) Take 1 tablet by mouth daily.      Marland Kitchen aspirin 81 MG chewable tablet Chew 1 tablet (81 mg total) by mouth daily.      Marland Kitchen atorvastatin (LIPITOR) 10 MG tablet Take 1 tablet (10 mg total) by mouth daily at 6 PM.  30 tablet  3  . b complex vitamins tablet Take 1 tablet by mouth daily.      . Calcium Carbonate-Vitamin D (CALCIUM + D PO) Take 1 tablet by mouth 2 (two) times daily.       Marland Kitchen CRANBERRY PO Take 1 tablet by mouth 2 (two) times daily.       . fish oil-omega-3 fatty acids 1000 MG capsule Take 1 g by mouth 2 (two) times daily.       Marland Kitchen HYDROcodone-acetaminophen (NORCO/VICODIN) 5-325 MG per tablet Take 1 tablet by mouth every 8 (eight) hours as needed for pain.  10 tablet  0  . lactase (LACTAID) 3000 UNITS  tablet Take 1 tablet by mouth daily as needed (only when eating dairy).      Marland Kitchen levothyroxine (SYNTHROID, LEVOTHROID) 75 MCG tablet Take 75 mcg by mouth daily.        . Loperamide HCl (LOPERAMIDE A-D PO) Take 1 tablet by mouth 4 (four) times daily as needed (diarrhea).       . metoprolol (LOPRESSOR) 50 MG tablet Take 1 tablet (50 mg total) by mouth as directed. 1/2 tablet twice a day  90 tablet  3  . Multiple Vitamin (MULTIVITAMIN WITH MINERALS) TABS Take 1 tablet by mouth daily.      . Probiotic Product (PROBIOTIC DAILY PO) Take 1 capsule by mouth daily.       . Rivaroxaban (XARELTO) 20 MG TABS Take 20 mg by mouth daily.      . ropinirole (REQUIP) 5 MG tablet Take 5 mg by mouth at bedtime.      . sertraline (ZOLOFT) 25 MG tablet Take 25 mg by mouth as directed. 1/2 TABLET DAILY FOR 1 WEEK, DECREASE TO 1/2 TABLET EVERY OTHER DAY FOR 1 WEEK, AND THEN STOP      . traMADol (ULTRAM) 50 MG tablet Take 50 mg by mouth every 6 (six) hours  as needed for pain.       No current facility-administered medications for this visit.    Allergies  Allergen Reactions  . Alendronate Sodium Nausea And Vomiting  . Ambien [Zolpidem Tartrate]     hallucinations  . Lyrica [Pregabalin] Nausea And Vomiting  . Milk-Related Compounds Diarrhea  . Neurontin [Gabapentin] Nausea And Vomiting  . Procaine Hcl Other (See Comments)    Reaction unknown  . Toprol Xl [Metoprolol Succinate] Other (See Comments)    Does not remember. Tolerates Lopressor 04/10/13, [name]    Patient Active Problem List   Diagnosis Date Noted  . Peripheral neuropathy 03/07/2011    Priority: High  . Aortic insufficiency 03/06/2011    Priority: Medium  . 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 Malaise and fatigue 05/26/2013  . PAF (paroxysmal atrial fibrillation) 04/08/2013  . HTN (hypertension) 04/08/2013  . Chronic diastolic CHF (congestive heart failure) 04/08/2013  . CAD (coronary artery disease) 04/08/2013  . Atrial fibrillation  01/31/2013  . Acute on chronic diastolic heart failure 01/31/2013  . Disorder of liver 10/07/2012  . Solitary pulmonary nodule 10/07/2012  . 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 Elevated blood pressure 09/12/2011  . Dizziness 08/09/2011  . Dilated aortic root 03/06/2011  . Hypothyroidism 03/06/2011  . Polymyalgia rheumatica 03/06/2011    History  Smoking status  . Never Smoker   Smokeless tobacco  . Never Used    History  Alcohol Use No    Family History  Problem Relation Age of Onset  . Heart disease Mother   . Stroke Mother   . Arthritis Father   . Cancer Father   . Hypertension Sister   . Anesthesia problems Brother     Review of Systems: Constitutional: no fever chills diaphoresis or fatigue or change in weight.  Head and neck: no hearing loss, no epistaxis, no photophobia or visual disturbance. Respiratory: No cough, shortness of breath or wheezing. Cardiovascular: No chest pain peripheral edema, palpitations. Gastrointestinal: No abdominal distention, no abdominal pain, no change in bowel habits hematochezia or melena. Genitourinary: No dysuria, no frequency, no urgency, no nocturia. Musculoskeletal:No arthralgias, no back pain, no gait disturbance or myalgias. Neurological: No dizziness, no headaches, no numbness, no seizures, no syncope, no weakness, no tremors. Hematologic: No lymphadenopathy, no easy bruising. Psychiatric: No confusion, no hallucinations, no sleep disturbance.    Physical Exam: Filed Vitals:   07/29/13 1015  BP: 160/80  Pulse: 52   the general appearance reveals a well-developed well-nourished woman in no distress.  Her color is good.The head and neck exam reveals pupils equal and reactive.  Extraocular movements are full.  There is no scleral icterus.  The mouth and pharynx are normal.  The neck is supple.  The carotids reveal no bruits.  The jugular venous pressure is normal.  The  thyroid is not enlarged.   There is no lymphadenopathy.  The chest is clear to percussion and auscultation.  There are no rales or rhonchi.  Expansion of the chest is symmetrical.  The precordium is quiet.  The first heart sound is normal.  The second heart sound is physiologically split.  The aortic closure sound is crisp and there is no aortic insufficiency.  There is no abnormal lift or heave.  The abdomen is soft and nontender.  The bowel sounds are normal.  The liver and spleen are not enlarged.  There are no abdominal masses.  There are no abdominal bruits.  Extremities reveal  good pedal pulses.  There is no phlebitis or edema.  There is no cyanosis or clubbing.  Strength is normal and symmetrical in all extremities.  There is no lateralizing weakness.  There are no sensory deficits.  The skin is warm and dry.  There is no rash.     Assessment / Plan: We are checking lab work today.  The patient is to continue same medication except she will taper her Zoloft over the next 2 weeks and she will decrease her amiodarone to 100 mg daily. Recheck in 2 months for office visit EKG TSH and CBC

## 2013-07-29 NOTE — Assessment & Plan Note (Signed)
The patient does complain of some sensation of dizziness and we will reduce her amiodarone to just 100 mg daily and observe response.

## 2013-07-29 NOTE — Assessment & Plan Note (Signed)
The patient is on long-term Xarelto for her paroxysmal atrial fibrillation.  She has had no awareness of any recurrent atrial fibrillation.

## 2013-07-31 ENCOUNTER — Telehealth: Payer: Self-pay | Admitting: *Deleted

## 2013-07-31 NOTE — Telephone Encounter (Signed)
Advised patient of lab results  

## 2013-07-31 NOTE — Telephone Encounter (Signed)
Message copied by Burnell Blanks on Fri Jul 31, 2013 10:41 AM ------      Message from: Cassell Clement      Created: Wed Jul 29, 2013  9:59 PM       Please report to patient.  The recent labs are stable. Continue same medication and careful diet. ------

## 2013-08-03 ENCOUNTER — Other Ambulatory Visit: Payer: Self-pay | Admitting: *Deleted

## 2013-08-03 MED ORDER — ATORVASTATIN CALCIUM 10 MG PO TABS: 10.0000 mg | ORAL_TABLET | Freq: Every day | ORAL | Status: DC

## 2013-08-06 ENCOUNTER — Other Ambulatory Visit: Payer: Self-pay

## 2013-08-06 MED ORDER — ATORVASTATIN CALCIUM 10 MG PO TABS: 10.0000 mg | ORAL_TABLET | Freq: Every day | ORAL | Status: DC

## 2013-08-11 ENCOUNTER — Telehealth: Payer: Self-pay | Admitting: Cardiology

## 2013-08-11 NOTE — Telephone Encounter (Signed)
Called due to onset of nosebleed. Occurred after blowing her nose. Holding pressure with compress and it may be slowing. Not swallowing clots or significant drainage down throat. Recommended at least 15 minutes of continous pressure. If fails to stop at that time, recommended ER visit. Hold AM xarelto dose and restart the next dose. FYI to Dr. Patty Sermons.

## 2013-08-20 ENCOUNTER — Telehealth: Payer: Self-pay | Admitting: Cardiology

## 2013-08-20 MED ORDER — RIVAROXABAN 15 MG PO TABS: 15.0000 mg | ORAL_TABLET | Freq: Every day | ORAL | Status: DC

## 2013-08-20 NOTE — Telephone Encounter (Signed)
Yes, because of repeated epistaxis let us decrease her Xarelto down to 15 mg daily.

## 2013-08-20 NOTE — Telephone Encounter (Signed)
08-20-13 pt's husband rtn call , pls call [phone]

## 2013-08-20 NOTE — Telephone Encounter (Signed)
I spoke with patient's husband who states the patient was able to be treated by the ENT this morning and has follow-up scheduled.  Patient is feeling better and is pleased with the results.  I advised patient's husband that Dr. Patty Sermons has decreased the patient's dosage of Xarelto to 15 mg QD and that I have sent the new Rx to patient's pharmacy.  Husband verbalized understanding.

## 2013-08-20 NOTE — Telephone Encounter (Signed)
Pt husband calling re pt having nose bleed X 30 minutes, using pressure, ice, not helping, pls advise

## 2013-08-20 NOTE — Telephone Encounter (Signed)
Spoke with patient's husband who states patient has had a nosebleed for 30-45 minutes with blood clots.  Husband states this occurred 2 weeks ago and they are following the advice of the doctor who was on call at that time which is holding pressure and applying ice.  Patient took her Xarelto this morning.  I advised patient's husband that I spoke with Dr. Patty Sermons prior to calling him and Dr. Patty Sermons advised patient hold Xarelto today but patient has already taken it and he advised that patient call their ENT for an appointment.  Husband verbalized understanding and agreement.  I asked husband to call me back to let me know if they were able to get an appointment with the ENT for today or tomorrow.  He verbalized understanding and agreement. Patient's husband asked if Dr. Patty Sermons would consider lower the patient's dose of Xarelto.  I advised that I would send message to Dr. Patty Sermons for advice.

## 2013-10-02 ENCOUNTER — Ambulatory Visit (INDEPENDENT_AMBULATORY_CARE_PROVIDER_SITE_OTHER): Payer: Medicare Other | Admitting: Cardiology

## 2013-10-02 ENCOUNTER — Other Ambulatory Visit: Payer: Medicare Other

## 2013-10-02 ENCOUNTER — Encounter: Payer: Self-pay | Admitting: Cardiology

## 2013-10-02 VITALS — BP 114/60 | HR 50 | Ht 65.0 in | Wt 125.0 lb

## 2013-10-02 DIAGNOSIS — Z953 Presence of xenogenic heart valve: Secondary | ICD-10-CM

## 2013-10-02 DIAGNOSIS — I4891 Unspecified atrial fibrillation: Secondary | ICD-10-CM

## 2013-10-02 DIAGNOSIS — I480 Paroxysmal atrial fibrillation: Secondary | ICD-10-CM

## 2013-10-02 DIAGNOSIS — I2510 Atherosclerotic heart disease of native coronary artery without angina pectoris: Secondary | ICD-10-CM

## 2013-10-02 DIAGNOSIS — I10 Essential (primary) hypertension: Secondary | ICD-10-CM

## 2013-10-02 DIAGNOSIS — Z9889 Other specified postprocedural states: Secondary | ICD-10-CM

## 2013-10-02 DIAGNOSIS — Z952 Presence of prosthetic heart valve: Secondary | ICD-10-CM

## 2013-10-02 LAB — CBC WITH DIFFERENTIAL/PLATELET
Basophils Relative: 0.5 % (ref 0.0–3.0)
Eosinophils Absolute: 0.1 10*3/uL (ref 0.0–0.7)
Lymphocytes Relative: 24 % (ref 12.0–46.0)
Lymphs Abs: 1.2 10*3/uL (ref 0.7–4.0)
MCHC: 32.6 g/dL (ref 30.0–36.0)
Monocytes Absolute: 0.5 10*3/uL (ref 0.1–1.0)
Neutro Abs: 3.2 10*3/uL (ref 1.4–7.7)
WBC: 5.1 10*3/uL (ref 4.5–10.5)

## 2013-10-02 LAB — TSH: TSH: 2.8 u[IU]/mL (ref 0.35–5.50)

## 2013-10-02 NOTE — Assessment & Plan Note (Signed)
She has not had any recurrent atrial fibrillation.  Her dizziness may be from her low dose amiodarone.  We are going to stop the amiodarone altogether today.  Hopefully her dizziness and poor balance will improve off amiodarone.  I told her it might be a month or 2 before she notices much difference because of the long half-life of amiodarone

## 2013-10-02 NOTE — Assessment & Plan Note (Signed)
Blood pressure has been remaining normal on current therapy.

## 2013-10-02 NOTE — Patient Instructions (Signed)
Your physician recommends that you have lab work today: TSH and CBC  Your physician has recommended you make the following change in your medication: STOP Amiodarone  Your physician recommends that you schedule a follow-up appointment in: 2 MONTHS with Dr Patty Sermons (EKG)

## 2013-10-02 NOTE — Assessment & Plan Note (Signed)
The patient has not been having any recurrent chest pain or angina.  She is not having symptoms of CHF.  He has been swimming for exercise.  She checks her oxygen saturations at home and they are in the mid 90s

## 2013-10-02 NOTE — Progress Notes (Signed)
Kayla Chambers Date of Birth:  06-06-1927 [address] [place], [place]  [address] [phone]         Fax   [phone]  History of Present Illness: This pleasant 79 year old woman is seen for a followup office visit. She underwent a Bentall procedure by Dr. Tyrone Sage on 03/31/13. 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 Preoperatively she had been in and out of atrial fibrillation.  More recently she has been maintaining normal sinus rhythm.  She has had some dizziness which may be secondary to her amiodarone.  She has decided not to do the cardiac rehabilitation program but plans to exercise at the [facility].  Her postoperative depression has improved and her husband thinks that she can be tapered off the Zoloft now.  She agrees.   Current Outpatient Prescriptions  Medication Sig Dispense Refill  . ALPRAZolam (XANAX) 0.5 MG tablet Take 0.5 mg by mouth at bedtime as needed for sleep or anxiety.      . Ascorbic Acid (VITAMIN C PO) Take 1 tablet by mouth daily.      Marland Kitchen atorvastatin (LIPITOR) 10 MG tablet Take 1 tablet (10 mg total) by mouth daily at 6 PM.  30 tablet  3  . b complex vitamins tablet Take 1 tablet by mouth daily.      . Calcium Carbonate-Vitamin D (CALCIUM + D PO) Take 1 tablet by mouth 2 (two) times daily.       Marland Kitchen CRANBERRY PO Take 1 tablet by mouth 2 (two) times daily.       . fish oil-omega-3 fatty acids 1000 MG capsule Take 1 g by mouth 2 (two) times daily.       Marland Kitchen HYDROcodone-acetaminophen (NORCO/VICODIN) 5-325 MG per tablet Take 1 tablet by mouth every 8 (eight) hours as needed for pain.  10 tablet  0  . lactase (LACTAID) 3000 UNITS tablet Take 1 tablet by mouth daily as needed (only when eating dairy).      Marland Kitchen levothyroxine (SYNTHROID, LEVOTHROID) 75 MCG tablet Take 75 mcg by mouth daily.        . Loperamide HCl  (LOPERAMIDE A-D PO) Take 1 tablet by mouth 4 (four) times daily as needed (diarrhea).       . metoprolol (LOPRESSOR) 50 MG tablet Take 1 tablet (50 mg total) by mouth as directed. 1/2 tablet twice a day  90 tablet  3  . Multiple Vitamin (MULTIVITAMIN WITH MINERALS) TABS Take 1 tablet by mouth daily.      . Rivaroxaban (XARELTO) 15 MG TABS tablet Take 1 tablet (15 mg total) by mouth daily.  90 tablet  3  . ropinirole (REQUIP) 5 MG tablet Take 5 mg by mouth at bedtime.      . traMADol (ULTRAM) 50 MG tablet Take 50 mg by mouth every 6 (six) hours as needed for pain.       No current facility-administered medications for this visit.    Allergies  Allergen Reactions  . Alendronate Sodium Nausea And Vomiting  . Ambien [Zolpidem Tartrate]     hallucinations  . Lac [place] Nausea Only    Drinks soy milk  . Lyrica [Pregabalin] Nausea And Vomiting  . Milk-Related Compounds Diarrhea  . Neurontin [Gabapentin] Nausea And Vomiting  . Procaine Hcl Other (See Comments)    Reaction unknown  . Toprol Xl [Metoprolol Succinate] Other (See  Comments)    Does not remember. Tolerates Lopressor 04/10/13, [name]    Patient Active Problem List   Diagnosis Date Noted  . Peripheral neuropathy 03/07/2011    Priority: High  . Aortic insufficiency 03/06/2011    Priority: Medium  . 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 Malaise and fatigue 05/26/2013  . PAF (paroxysmal atrial fibrillation) 04/08/2013  . HTN (hypertension) 04/08/2013  . Chronic diastolic CHF (congestive heart failure) 04/08/2013  . CAD (coronary artery disease) 04/08/2013  . Atrial fibrillation 01/31/2013  . Acute on chronic diastolic heart failure 01/31/2013  . Disorder of liver 10/07/2012  . Solitary pulmonary nodule 10/07/2012  . 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 Elevated blood pressure 09/12/2011  . Dizziness 08/09/2011  . Dilated aortic root 03/06/2011  . Hypothyroidism 03/06/2011  . Polymyalgia  rheumatica 03/06/2011    History  Smoking status  . Never Smoker   Smokeless tobacco  . Never Used    History  Alcohol Use No    Family History  Problem Relation Age of Onset  . Heart disease Mother   . Stroke Mother   . Arthritis Father   . Cancer Father   . Hypertension Sister   . Anesthesia problems Brother     Review of Systems: Constitutional: no fever chills diaphoresis or fatigue or change in weight.  Head and neck: no hearing loss, no epistaxis, no photophobia or visual disturbance. Respiratory: No cough, shortness of breath or wheezing. Cardiovascular: No chest pain peripheral edema, palpitations. Gastrointestinal: No abdominal distention, no abdominal pain, no change in bowel habits hematochezia or melena. Genitourinary: No dysuria, no frequency, no urgency, no nocturia. Musculoskeletal:No arthralgias, no back pain, no gait disturbance or myalgias. Neurological: No dizziness, no headaches, no numbness, no seizures, no syncope, no weakness, no tremors. Hematologic: No lymphadenopathy, no easy bruising. Psychiatric: No confusion, no hallucinations, no sleep disturbance.    Physical Exam: Filed Vitals:   10/02/13 1424  BP: 114/60  Pulse: 50   the general appearance reveals a well-developed well-nourished woman in no distress.  Her color is good.The head and neck exam reveals pupils equal and reactive.  Extraocular movements are full.  There is no scleral icterus.  The mouth and pharynx are normal.  The neck is supple.  The carotids reveal no bruits.  The jugular venous pressure is normal.  The  thyroid is not enlarged.  There is no lymphadenopathy.  The chest is clear to percussion and auscultation.  There are no rales or rhonchi.  Expansion of the chest is symmetrical.  The precordium is quiet.  The first heart sound is normal.  The second heart sound is physiologically split.  The aortic closure sound is crisp and there is no aortic insufficiency.  There is no  abnormal lift or heave.  The abdomen is soft and nontender.  The bowel sounds are normal.  The liver and spleen are not enlarged.  There are no abdominal masses.  There are no abdominal bruits.  Extremities reveal good pedal pulses.  There is no phlebitis or edema.  There is no cyanosis or clubbing.  Strength is normal and symmetrical in all extremities.  There is no lateralizing weakness.  There are no sensory deficits.  The skin is warm and dry.  There is no rash.  EKG today shows sinus bradycardia.  Since last tracing of 05/26/13 heart rate is slower and the lateral ST depression has improved.   Assessment / Plan: Continue same  medication except stop amiodarone altogether at this point. Recheck in 2 months for office visit and EKG.

## 2013-10-03 NOTE — Progress Notes (Signed)
Quick Note:  Please report to patient. The recent labs are stable. Continue same medication and careful diet. ______ 

## 2013-11-30 ENCOUNTER — Other Ambulatory Visit: Payer: Self-pay | Admitting: Cardiology

## 2013-12-02 ENCOUNTER — Ambulatory Visit (INDEPENDENT_AMBULATORY_CARE_PROVIDER_SITE_OTHER): Payer: Medicare Other | Admitting: Cardiology

## 2013-12-02 ENCOUNTER — Encounter (INDEPENDENT_AMBULATORY_CARE_PROVIDER_SITE_OTHER): Payer: Self-pay

## 2013-12-02 ENCOUNTER — Encounter: Payer: Self-pay | Admitting: Cardiology

## 2013-12-02 VITALS — BP 120/68 | HR 56 | Ht 65.0 in | Wt 126.0 lb

## 2013-12-02 DIAGNOSIS — I480 Paroxysmal atrial fibrillation: Secondary | ICD-10-CM

## 2013-12-02 DIAGNOSIS — E039 Hypothyroidism, unspecified: Secondary | ICD-10-CM

## 2013-12-02 DIAGNOSIS — I2510 Atherosclerotic heart disease of native coronary artery without angina pectoris: Secondary | ICD-10-CM

## 2013-12-02 DIAGNOSIS — Z953 Presence of xenogenic heart valve: Secondary | ICD-10-CM

## 2013-12-02 DIAGNOSIS — Z952 Presence of prosthetic heart valve: Secondary | ICD-10-CM

## 2013-12-02 DIAGNOSIS — I4891 Unspecified atrial fibrillation: Secondary | ICD-10-CM

## 2013-12-02 DIAGNOSIS — J40 Bronchitis, not specified as acute or chronic: Secondary | ICD-10-CM | POA: Insufficient documentation

## 2013-12-02 DIAGNOSIS — I119 Hypertensive heart disease without heart failure: Secondary | ICD-10-CM

## 2013-12-02 MED ORDER — AZITHROMYCIN 250 MG PO TABS: ORAL_TABLET | ORAL | Status: DC

## 2013-12-02 NOTE — Assessment & Plan Note (Signed)
The patient is status post CABG.  She has not been experiencing any angina pectoris.  She is on statins.  We will plan to recheck fasting labs at her next visit.

## 2013-12-02 NOTE — Progress Notes (Signed)
Kayla Chambers Date of Birth:  November 07, 1927 [address] [place], [place]  [address] [phone]         Fax   [phone]  History of Present Illness: This pleasant 80 year old woman is seen for a followup office visit. She underwent a Bentall procedure by Dr. Tyrone Sage on 03/31/13. 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 Preoperatively she had been in and out of atrial fibrillation.  More recently she has been maintaining normal sinus rhythm.  At her last visit she had some dizziness which may be secondary to her amiodarone.  Her dizziness has improved since we stopped her amiodarone  She has decided not to do the cardiac rehabilitation program but plans to exercise at the [facility].   She swims at the [facility] for exercise.  Overall she's been doing well.  Her appetite has improved.  She has gained 1 pound.  For the last several weeks she has had a cough consistent with bronchitis.   Current Outpatient Prescriptions  Medication Sig Dispense Refill  . ALPRAZolam (XANAX) 0.5 MG tablet Take 0.5 mg by mouth at bedtime as needed for sleep or anxiety.      . Ascorbic Acid (VITAMIN C PO) Take 1 tablet by mouth daily.      Marland Kitchen atorvastatin (LIPITOR) 10 MG tablet take 1 tablet by mouth once daily AT 6PM  30 tablet  3  . b complex vitamins tablet Take 1 tablet by mouth daily.      . Calcium Carbonate-Vitamin D (CALCIUM + D PO) Take 1 tablet by mouth 2 (two) times daily.       Marland Kitchen CRANBERRY PO Take 1 tablet by mouth 2 (two) times daily.       . fish oil-omega-3 fatty acids 1000 MG capsule Take 1 g by mouth 2 (two) times daily.       Marland Kitchen lactase (LACTAID) 3000 UNITS tablet Take 1 tablet by mouth daily as needed (only when eating dairy).      Marland Kitchen levothyroxine (SYNTHROID, LEVOTHROID) 75 MCG tablet Take 75 mcg by mouth daily.        . Loperamide  HCl (LOPERAMIDE A-D PO) Take 1 tablet by mouth 4 (four) times daily as needed (diarrhea).       . metoprolol (LOPRESSOR) 50 MG tablet Take 1 tablet (50 mg total) by mouth as directed. 1/2 tablet twice a day  90 tablet  3  . Multiple Vitamin (MULTIVITAMIN WITH MINERALS) TABS Take 1 tablet by mouth daily.      . Rivaroxaban (XARELTO) 15 MG TABS tablet Take 1 tablet (15 mg total) by mouth daily.  90 tablet  3  . ropinirole (REQUIP) 5 MG tablet Take 5 mg by mouth at bedtime. RESTLESS LEG      . azithromycin (ZITHROMAX) 250 MG tablet 2 tablets today and then 1 daily until finished  6 each  0  . HYDROcodone-acetaminophen (NORCO/VICODIN) 5-325 MG per tablet Take 1 tablet by mouth every 8 (eight) hours as needed for pain.  10 tablet  0  . traMADol (ULTRAM) 50 MG tablet Take 50 mg by mouth every 6 (six) hours as needed for pain.       No current facility-administered medications for this visit.    Allergies  Allergen Reactions  . Alendronate Sodium Nausea And Vomiting  . Ambien [Zolpidem Tartrate]  hallucinations  . Lac Bovis Nausea Only    Drinks soy milk  . Lyrica [Pregabalin] Nausea And Vomiting  . Milk-Related Compounds Diarrhea  . Neurontin [Gabapentin] Nausea And Vomiting  . Procaine Hcl Other (See Comments)    Reaction unknown  . Toprol Xl [Metoprolol Succinate] Other (See Comments)    Does not remember. Tolerates Lopressor 04/10/13, [name]    Patient Active Problem List   Diagnosis Date Noted  . Peripheral neuropathy 03/07/2011    Priority: High  . Aortic insufficiency 03/06/2011    Priority: Medium  . 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 Bronchitis 12/02/2013  . Malaise and fatigue 05/26/2013  . PAF (paroxysmal atrial fibrillation) 04/08/2013  . HTN (hypertension) 04/08/2013  . Chronic diastolic CHF (congestive heart failure) 04/08/2013  . CAD (coronary artery disease) 04/08/2013  . Atrial fibrillation 01/31/2013  . Acute on chronic diastolic heart failure 10/93/2355    . Disorder of liver 10/07/2012  . Solitary pulmonary nodule 10/07/2012  . 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 Elevated blood pressure 09/12/2011  . Dizziness 08/09/2011  . Dilated aortic root 03/06/2011  . Hypothyroidism 03/06/2011  . Polymyalgia rheumatica 03/06/2011    History  Smoking status  . Never Smoker   Smokeless tobacco  . Never Used    History  Alcohol Use No    Family History  Problem Relation Age of Onset  . Heart disease Mother   . Stroke Mother   . Arthritis Father   . Cancer Father   . Hypertension Sister   . Anesthesia problems Brother     Review of Systems: Constitutional: no fever chills diaphoresis or fatigue or change in weight.  Head and neck: no hearing loss, no epistaxis, no photophobia or visual disturbance. Respiratory: No cough, shortness of breath or wheezing. Cardiovascular: No chest pain peripheral edema, palpitations. Gastrointestinal: No abdominal distention, no abdominal pain, no change in bowel habits hematochezia or melena. Genitourinary: No dysuria, no frequency, no urgency, no nocturia. Musculoskeletal:No arthralgias, no back pain, no gait disturbance or myalgias. Neurological: No dizziness, no headaches, no numbness, no seizures, no syncope, no weakness, no tremors. Hematologic: No lymphadenopathy, no easy bruising. Psychiatric: No confusion, no hallucinations, no sleep disturbance.    Physical Exam: Filed Vitals:   12/02/13 1000  BP: 120/68  Pulse: 56   the general appearance reveals a well-developed well-nourished woman in no distress.  Her color is good.The head and neck exam reveals pupils equal and reactive.  Extraocular movements are full.  There is no scleral icterus.  The mouth and pharynx are normal.  The neck is supple.  The carotids reveal no bruits.  The jugular venous pressure is normal.  The  thyroid is not enlarged.  There is no lymphadenopathy.  The chest is clear to percussion and  auscultation.  There are no rales or rhonchi.  Expansion of the chest is symmetrical.  The precordium is quiet.  The first heart sound is normal.  The second heart sound is physiologically split.  The aortic closure sound is crisp and there is no aortic insufficiency.  There is no abnormal lift or heave.  The abdomen is soft and nontender.  The bowel sounds are normal.  The liver and spleen are not enlarged.  There are no abdominal masses.  There are no abdominal bruits.  Extremities reveal good pedal pulses.  There is no phlebitis or edema.  There is no cyanosis or clubbing.  Strength is normal and symmetrical  in all extremities.  There is no lateralizing weakness.  There are no sensory deficits.  The skin is warm and dry.  There is no rash.  EKG today shows sinus bradycardia.  There are nonspecific T-wave changes present   Assessment / Plan: Continue same cardiac meds.  Start Z-Pak and Mucinex for bronchitis. Recheck in 3 months for followup office visit EKG CBC TSH fasting lipid panel hepatic function panel and basal metabolic panel.

## 2013-12-02 NOTE — Assessment & Plan Note (Signed)
She is coughing up moderate sputum.  Not much colon to the sputum.  Her cough has persisted for several weeks according to her husband.  She coughs a lot at night as well.  We will treat this with a Z-Pak and with the Mucinex as bronchitis.

## 2013-12-02 NOTE — Patient Instructions (Addendum)
RX FOR ZPAK SENT TO PHARMACY, CALL BACK IF NO BETTER  Your physician wants you to follow-up in: 3 months with fasting labs (LP/BMET/HFP/CBC/TSH)AND EKG  You will receive a reminder letter in the mail two months in advance. If you don't receive a letter, please call our office to schedule the follow-up appointment.

## 2013-12-02 NOTE — Assessment & Plan Note (Signed)
Since stopping the amiodarone the patient is maintaining normal sinus rhythm.  She is on long-term Xarelto.

## 2013-12-14 ENCOUNTER — Ambulatory Visit
Admission: RE | Admit: 2013-12-14 | Discharge: 2013-12-14 | Disposition: A | Discharge status: Home / Self Care | Payer: Medicare Other | Source: Ambulatory Visit | Attending: Family Medicine | Admitting: Family Medicine

## 2013-12-14 ENCOUNTER — Other Ambulatory Visit: Payer: Self-pay | Admitting: Family Medicine

## 2013-12-14 DIAGNOSIS — R059 Cough, unspecified: Secondary | ICD-10-CM

## 2013-12-14 DIAGNOSIS — R05 Cough: Secondary | ICD-10-CM

## 2014-02-09 ENCOUNTER — Encounter: Payer: Self-pay | Admitting: Cardiology

## 2014-02-18 ENCOUNTER — Other Ambulatory Visit: Payer: Self-pay | Admitting: Cardiology

## 2014-03-17 ENCOUNTER — Other Ambulatory Visit: Payer: Self-pay | Admitting: *Deleted

## 2014-03-17 MED ORDER — RIVAROXABAN 15 MG PO TABS: 15.0000 mg | ORAL_TABLET | Freq: Every day | ORAL | Status: DC

## 2014-03-24 ENCOUNTER — Other Ambulatory Visit: Payer: Self-pay | Admitting: Cardiology

## 2014-03-25 ENCOUNTER — Other Ambulatory Visit: Payer: Medicare Other

## 2014-03-25 ENCOUNTER — Ambulatory Visit: Payer: Medicare Other | Admitting: Cardiology

## 2014-04-23 ENCOUNTER — Other Ambulatory Visit: Payer: Medicare Other

## 2014-04-23 ENCOUNTER — Ambulatory Visit (INDEPENDENT_AMBULATORY_CARE_PROVIDER_SITE_OTHER): Payer: Medicare Other | Admitting: Cardiology

## 2014-04-23 ENCOUNTER — Encounter: Payer: Self-pay | Admitting: Cardiology

## 2014-04-23 VITALS — BP 128/78 | HR 55 | Ht 65.0 in | Wt 128.8 lb

## 2014-04-23 DIAGNOSIS — G609 Hereditary and idiopathic neuropathy, unspecified: Secondary | ICD-10-CM

## 2014-04-23 DIAGNOSIS — R42 Dizziness and giddiness: Secondary | ICD-10-CM

## 2014-04-23 DIAGNOSIS — I4891 Unspecified atrial fibrillation: Secondary | ICD-10-CM

## 2014-04-23 DIAGNOSIS — Z953 Presence of xenogenic heart valve: Secondary | ICD-10-CM

## 2014-04-23 DIAGNOSIS — E039 Hypothyroidism, unspecified: Secondary | ICD-10-CM

## 2014-04-23 DIAGNOSIS — I5033 Acute on chronic diastolic (congestive) heart failure: Secondary | ICD-10-CM

## 2014-04-23 DIAGNOSIS — I2510 Atherosclerotic heart disease of native coronary artery without angina pectoris: Secondary | ICD-10-CM

## 2014-04-23 DIAGNOSIS — I119 Hypertensive heart disease without heart failure: Secondary | ICD-10-CM

## 2014-04-23 DIAGNOSIS — Z952 Presence of prosthetic heart valve: Secondary | ICD-10-CM

## 2014-04-23 DIAGNOSIS — I480 Paroxysmal atrial fibrillation: Secondary | ICD-10-CM

## 2014-04-23 DIAGNOSIS — G629 Polyneuropathy, unspecified: Secondary | ICD-10-CM

## 2014-04-23 LAB — CBC WITH DIFFERENTIAL/PLATELET
BASOS ABS: 0 10*3/uL (ref 0.0–0.1)
Basophils Relative: 0.5 % (ref 0.0–3.0)
EOS ABS: 0.1 10*3/uL (ref 0.0–0.7)
Eosinophils Relative: 3 % (ref 0.0–5.0)
HCT: 42.6 % (ref 36.0–46.0)
HEMOGLOBIN: 14.1 g/dL (ref 12.0–15.0)
LYMPHS PCT: 25 % (ref 12.0–46.0)
Lymphs Abs: 1.1 10*3/uL (ref 0.7–4.0)
MCHC: 33 g/dL (ref 30.0–36.0)
MCV: 94.2 fl (ref 78.0–100.0)
MONOS PCT: 9.6 % (ref 3.0–12.0)
Monocytes Absolute: 0.4 10*3/uL (ref 0.1–1.0)
NEUTROS ABS: 2.6 10*3/uL (ref 1.4–7.7)
NEUTROS PCT: 61.9 % (ref 43.0–77.0)
Platelets: 140 10*3/uL — ABNORMAL LOW (ref 150.0–400.0)
RBC: 4.52 Mil/uL (ref 3.87–5.11)
RDW: 15 % (ref 11.5–15.5)
WBC: 4.2 10*3/uL (ref 4.0–10.5)

## 2014-04-23 LAB — HEPATIC FUNCTION PANEL
ALBUMIN: 3.9 g/dL (ref 3.5–5.2)
ALK PHOS: 79 U/L (ref 39–117)
ALT: 15 U/L (ref 0–35)
AST: 22 U/L (ref 0–37)
Bilirubin, Direct: 0.2 mg/dL (ref 0.0–0.3)
TOTAL PROTEIN: 6 g/dL (ref 6.0–8.3)
Total Bilirubin: 0.6 mg/dL (ref 0.2–1.2)

## 2014-04-23 LAB — LIPID PANEL
CHOLESTEROL: 148 mg/dL (ref 0–200)
HDL: 64.3 mg/dL (ref >39.00)
LDL CALC: 74 mg/dL (ref 0–99)
NonHDL: 83.7
TRIGLYCERIDES: 49 mg/dL (ref 0.0–149.0)
Total CHOL/HDL Ratio: 2
VLDL: 9.8 mg/dL (ref 0.0–40.0)

## 2014-04-23 LAB — BASIC METABOLIC PANEL
BUN: 15 mg/dL (ref 6–23)
CALCIUM: 9.4 mg/dL (ref 8.4–10.5)
CO2: 31 meq/L (ref 19–32)
Chloride: 105 mEq/L (ref 96–112)
Creatinine, Ser: 0.7 mg/dL (ref 0.4–1.2)
GFR: 90.56 mL/min (ref >60.00)
GLUCOSE: 82 mg/dL (ref 70–99)
POTASSIUM: 5.4 meq/L — AB (ref 3.5–5.1)
Sodium: 140 mEq/L (ref 135–145)

## 2014-04-23 LAB — TSH: TSH: 2.24 u[IU]/mL (ref 0.35–4.50)

## 2014-04-23 NOTE — Assessment & Plan Note (Signed)
Her peripheral neuropathy is stable.

## 2014-04-23 NOTE — Assessment & Plan Note (Signed)
The patient has had no recurrence of her atrial fibrillation.  She is on low-dose Xarelto.  She did not tolerate the regular dose because of excessive bruising.

## 2014-04-23 NOTE — Assessment & Plan Note (Signed)
The patient has not been experiencing any symptoms of heart failure.  No orthopnea or peripheral edema.

## 2014-04-23 NOTE — Patient Instructions (Signed)
Will obtain labs today and call you with the results (lp/bmet/hfp/cbc/tsh)  Your physician recommends that you continue on your current medications as directed. Please refer to the Current Medication list given to you today.  Your physician wants you to follow-up in: 4 month ov You will receive a reminder letter in the mail two months in advance. If you don't receive a letter, please call our office to schedule the follow-up appointment.   Your physician has requested that you have an echocardiogram. Echocardiography is a painless test that uses sound waves to create images of your heart. It provides your doctor with information about the size and shape of your heart and how well your heart's chambers and valves are working. This procedure takes approximately one hour. There are no restrictions for this procedure.

## 2014-04-23 NOTE — Assessment & Plan Note (Signed)
She has not had any recurrent chest pain or angina

## 2014-04-23 NOTE — Progress Notes (Signed)
Kayla Chambers Date of Birth:  1927/07/23 [place] [address] [place], [place]  [address] [phone]        Fax   [phone]   History of Present Illness: This pleasant 80 year old woman is seen for a followup office visit. She underwent a Bentall procedure by Dr. Servando Snare on 03/31/13. 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 Preoperatively she had been in and out of atrial fibrillation. More recently she has been maintaining normal sinus rhythm.  She has a past history of a sarcoma of the left thigh muscle.  This is followed at [facility] and is stable postoperatively.  She has a history of peripheral neuropathy.  She has a past history of chronic dizziness.  She has a history of hypertension.  She has treated hypothyroidism.  Current Outpatient Prescriptions  Medication Sig Dispense Refill  . ALPRAZolam (XANAX) 0.5 MG tablet take 1/2 to 1 tablet by mouth at bedtime if needed  30 tablet  3  . Ascorbic Acid (VITAMIN C PO) Take 1 tablet by mouth daily.      Marland Kitchen atorvastatin (LIPITOR) 10 MG tablet take 1 tablet by mouth once daily AT 6PM  30 tablet  0  . b complex vitamins tablet Take 1 tablet by mouth daily.      . Calcium Carbonate-Vitamin D (CALCIUM + D PO) Take 1 tablet by mouth 2 (two) times daily.       Marland Kitchen CRANBERRY PO Take 1 tablet by mouth 2 (two) times daily.       . fish oil-omega-3 fatty acids 1000 MG capsule Take 1 g by mouth 2 (two) times daily.       Marland Kitchen HYDROcodone-acetaminophen (NORCO/VICODIN) 5-325 MG per tablet Take 1 tablet by mouth every 8 (eight) hours as needed for pain.  10 tablet  0  . lactase (LACTAID) 3000 UNITS tablet Take 1 tablet by mouth daily as needed (only when eating dairy).      Marland Kitchen levothyroxine (SYNTHROID, LEVOTHROID) 75 MCG tablet Take 75 mcg by mouth daily.        . Loperamide HCl (LOPERAMIDE  A-D PO) Take 1 tablet by mouth 4 (four) times daily as needed (diarrhea).       . metoprolol (LOPRESSOR) 50 MG tablet Take 1 tablet (50 mg total) by mouth as directed. 1/2 tablet twice a day  90 tablet  3  . Multiple Vitamin (MULTIVITAMIN WITH MINERALS) TABS Take 1 tablet by mouth daily.      . Rivaroxaban (XARELTO) 15 MG TABS tablet Take 1 tablet (15 mg total) by mouth daily.  90 tablet  3  . ropinirole (REQUIP) 5 MG tablet Take 5 mg by mouth at bedtime. RESTLESS LEG      . traMADol (ULTRAM) 50 MG tablet Take 50 mg by mouth every 6 (six) hours as needed for pain.       No current facility-administered medications for this visit.    Allergies  Allergen Reactions  . Alendronate Sodium Nausea And Vomiting  . Ambien [Zolpidem Tartrate]     hallucinations  . Lac Bovis Nausea Only    Drinks soy milk  . Lyrica [Pregabalin] Nausea And Vomiting  . Milk-Related Compounds Diarrhea  . Neurontin [Gabapentin] Nausea And Vomiting  . Procaine Hcl Other (See Comments)    Reaction unknown  . Toprol Xl [Metoprolol Succinate] Other (See  Comments)    Does not remember. Tolerates Lopressor 04/10/13, [name]    Patient Active Problem List   Diagnosis Date Noted  . Peripheral neuropathy 03/07/2011    Priority: High  . Aortic insufficiency 03/06/2011    Priority: Medium  . 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 Bronchitis 12/02/2013  . Malaise and fatigue 05/26/2013  . PAF (paroxysmal atrial fibrillation) 04/08/2013  . HTN (hypertension) 04/08/2013  . Chronic diastolic CHF (congestive heart failure) 04/08/2013  . CAD (coronary artery disease) 04/08/2013  . Atrial fibrillation 01/31/2013  . Acute on chronic diastolic heart failure 11/91/4782  . Disorder of liver 10/07/2012  . Solitary pulmonary nodule 10/07/2012  . 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 Elevated blood pressure 09/12/2011  . Dizziness 08/09/2011  . Dilated aortic root 03/06/2011  . Hypothyroidism  03/06/2011  . Polymyalgia rheumatica 03/06/2011    History  Smoking status  . Never Smoker   Smokeless tobacco  . Never Used    History  Alcohol Use No    Family History  Problem Relation Age of Onset  . Heart disease Mother   . Stroke Mother   . Arthritis Father   . Cancer Father   . Hypertension Sister   . Anesthesia problems Brother     Review of Systems: Constitutional: no fever chills diaphoresis or fatigue or change in weight.  Head and neck: no hearing loss, no epistaxis, no photophobia or visual disturbance. Respiratory: No cough, shortness of breath or wheezing. Cardiovascular: No chest pain peripheral edema, palpitations. Gastrointestinal: No abdominal distention, no abdominal pain, no change in bowel habits hematochezia or melena. Genitourinary: No dysuria, no frequency, no urgency, no nocturia. Musculoskeletal:No arthralgias, no back pain, no gait disturbance or myalgias. Neurological: No dizziness, no headaches, no numbness, no seizures, no syncope, no weakness, no tremors. Hematologic: No lymphadenopathy, no easy bruising. Psychiatric: No confusion, no hallucinations, no sleep disturbance.    Physical Exam: Filed Vitals:   04/23/14 0913  BP: 128/78  Pulse:    the general appearance reveals a well-developed well-nourished woman in no distress.The head and neck exam reveals pupils equal and reactive.  Extraocular movements are full.  There is no scleral icterus.  The mouth and pharynx are normal.  The neck is supple.  The carotids reveal no bruits.  The jugular venous pressure is normal.  The  thyroid is not enlarged.  There is no lymphadenopathy.  The chest is clear to percussion and auscultation.  There are no rales or rhonchi.  Expansion of the chest is symmetrical.  The precordium is quiet.  The first heart sound is normal.  The second heart sound is physiologically split.  There is no murmur gallop rub or click.  The aortic closure sound is crisp and there  is no aortic insufficiency heard. There is no abnormal lift or heave.  The abdomen is soft and nontender.  The bowel sounds are normal.  The liver and spleen are not enlarged.  There are no abdominal masses.  There are no abdominal bruits.  Extremities reveal good pedal pulses.  There is no phlebitis or edema.  There is no cyanosis or clubbing.  Strength is normal and symmetrical in all extremities.  There is no lateralizing weakness.  There are no sensory deficits.  The skin is warm and dry.  There is no rash.   EKG shows sinus bradycardia and nonspecific T-wave changes and is unchanged since 12/02/13  Assessment / Plan: 1.  Status  post Bentall procedure on 03/31/13 with replacement of her ascending aorta, aortic valve replacement with a bioprosthetic pericardial tissue valve, single-vessel CABG and Maze procedure. 2. Hypercholesterolemia 3. Hypothyroidism 4. essential hypertension without heart failure 5.  Coronary artery disease 6. history of paroxysmal atrial fibrillation, maintaining normal sinus rhythm, on Xarelto.  She will return soon for a followup echocardiogram to reassess her postoperative aortic valve prosthesis.  He has been a year since her surgery. Continue same medication.  Blood work today pending.  Recheck in 4 months for office visit.  Since I last saw them they celebrated their wedding anniversary by going on a cruise to [place] with friends from their church.

## 2014-04-25 NOTE — Progress Notes (Signed)
Quick Note:  Please report to patient. The recent labs are stable. Continue same medication and careful diet. ______ 

## 2014-04-30 ENCOUNTER — Telehealth: Payer: Self-pay | Admitting: Cardiology

## 2014-04-30 NOTE — Telephone Encounter (Signed)
Advised husband of labs

## 2014-04-30 NOTE — Telephone Encounter (Signed)
New message  ° ° °Returning call back to nurse.  °

## 2014-04-30 NOTE — Telephone Encounter (Signed)
Message copied by Earvin Hansen on Fri Apr 30, 2014  5:59 PM ------      Message from: Darlin Coco      Created: Sun Apr 25, 2014  7:42 PM       Please report to patient.  The recent labs are stable. Continue same medication and careful diet. ------

## 2014-05-07 ENCOUNTER — Other Ambulatory Visit: Payer: Self-pay | Admitting: Cardiology

## 2014-05-13 ENCOUNTER — Ambulatory Visit (HOSPITAL_COMMUNITY): Payer: Medicare Other | Attending: Internal Medicine | Admitting: Radiology

## 2014-05-13 DIAGNOSIS — Z953 Presence of xenogenic heart valve: Secondary | ICD-10-CM

## 2014-05-13 DIAGNOSIS — I359 Nonrheumatic aortic valve disorder, unspecified: Secondary | ICD-10-CM | POA: Insufficient documentation

## 2014-05-13 DIAGNOSIS — I480 Paroxysmal atrial fibrillation: Secondary | ICD-10-CM

## 2014-05-13 NOTE — Progress Notes (Signed)
Echocardiogram performed.  

## 2014-05-18 ENCOUNTER — Telehealth: Payer: Self-pay | Admitting: *Deleted

## 2014-05-18 MED ORDER — FUROSEMIDE 20 MG PO TABS: 20.0000 mg | ORAL_TABLET | Freq: Every day | ORAL | Status: DC | PRN

## 2014-05-18 NOTE — Telephone Encounter (Signed)
Message copied by Earvin Hansen on Tue May 18, 2014  9:46 AM ------      Message from: Darlin Coco      Created: Thu May 13, 2014 10:50 PM       Echo was very good.  Aortic valve functioning normally.  Normal LV systolic function. Both atria are enlarged. Continue same meds. ------

## 2014-05-18 NOTE — Telephone Encounter (Signed)
Husband states patient is having bilateral edema in legs, left worse than right. Denies any redness, warm to touch, or increased shortness of breath. If she stays off her feet during the day swelling does go down at night but hard to keep her off feet all day. She has taken Lasix in the past post surgery) and husband requesting her go back on it as needed since this helped before. Will forward to  Dr. Mare Ferrari for review.   Advised husband of echo results

## 2014-05-18 NOTE — Telephone Encounter (Signed)
Advised patient, if no improvement to call back

## 2014-05-18 NOTE — Telephone Encounter (Signed)
Okay to add lasix 20 mg daily PRN

## 2014-06-23 ENCOUNTER — Telehealth: Payer: Self-pay | Admitting: Cardiology

## 2014-06-23 NOTE — Telephone Encounter (Signed)
Pt's husband called regarding two bills that were denied by [facility]: Room and board when pt was in the hospital in may 2014 from the 14 th to the 23 rd, and [facility] 2014, and  Physical therapy 2015. Pt is aware that we will find out where he needs to call for information. Per [employer] at [employer]; pt to call  The phone number at the bottom of the bill. If does not work call [facility] ph [phone] which is the agency that handle all the bills. If he needs to call hear call the same number and follow the prompt to billing. Spoke with pt. She asked to call back to speak with her husband.

## 2014-06-23 NOTE — Telephone Encounter (Signed)
Pt's husband is aware  of Charmaine Hall in Billing's recommendations. Pt verbalized understanding.

## 2014-06-23 NOTE — Telephone Encounter (Signed)
New problem:   Mr newhouse would like a call back to speak to Kennon Rounds about an Ins issue.

## 2014-06-23 NOTE — Telephone Encounter (Signed)
New problem:   Pt's husband called and needs a call back about and Insurance issues the pt is have for a procedure.Marland Kitchen   He is aware Kennon Rounds is not here today.

## 2014-07-06 IMAGING — CR DG CHEST 1V PORT
1 series · 1 of 1 positions shown · non-contrast
Comparison: 03/26/2013

CLINICAL DATA: Post-op

PORTABLE CHEST - 1 VIEW

[AP]
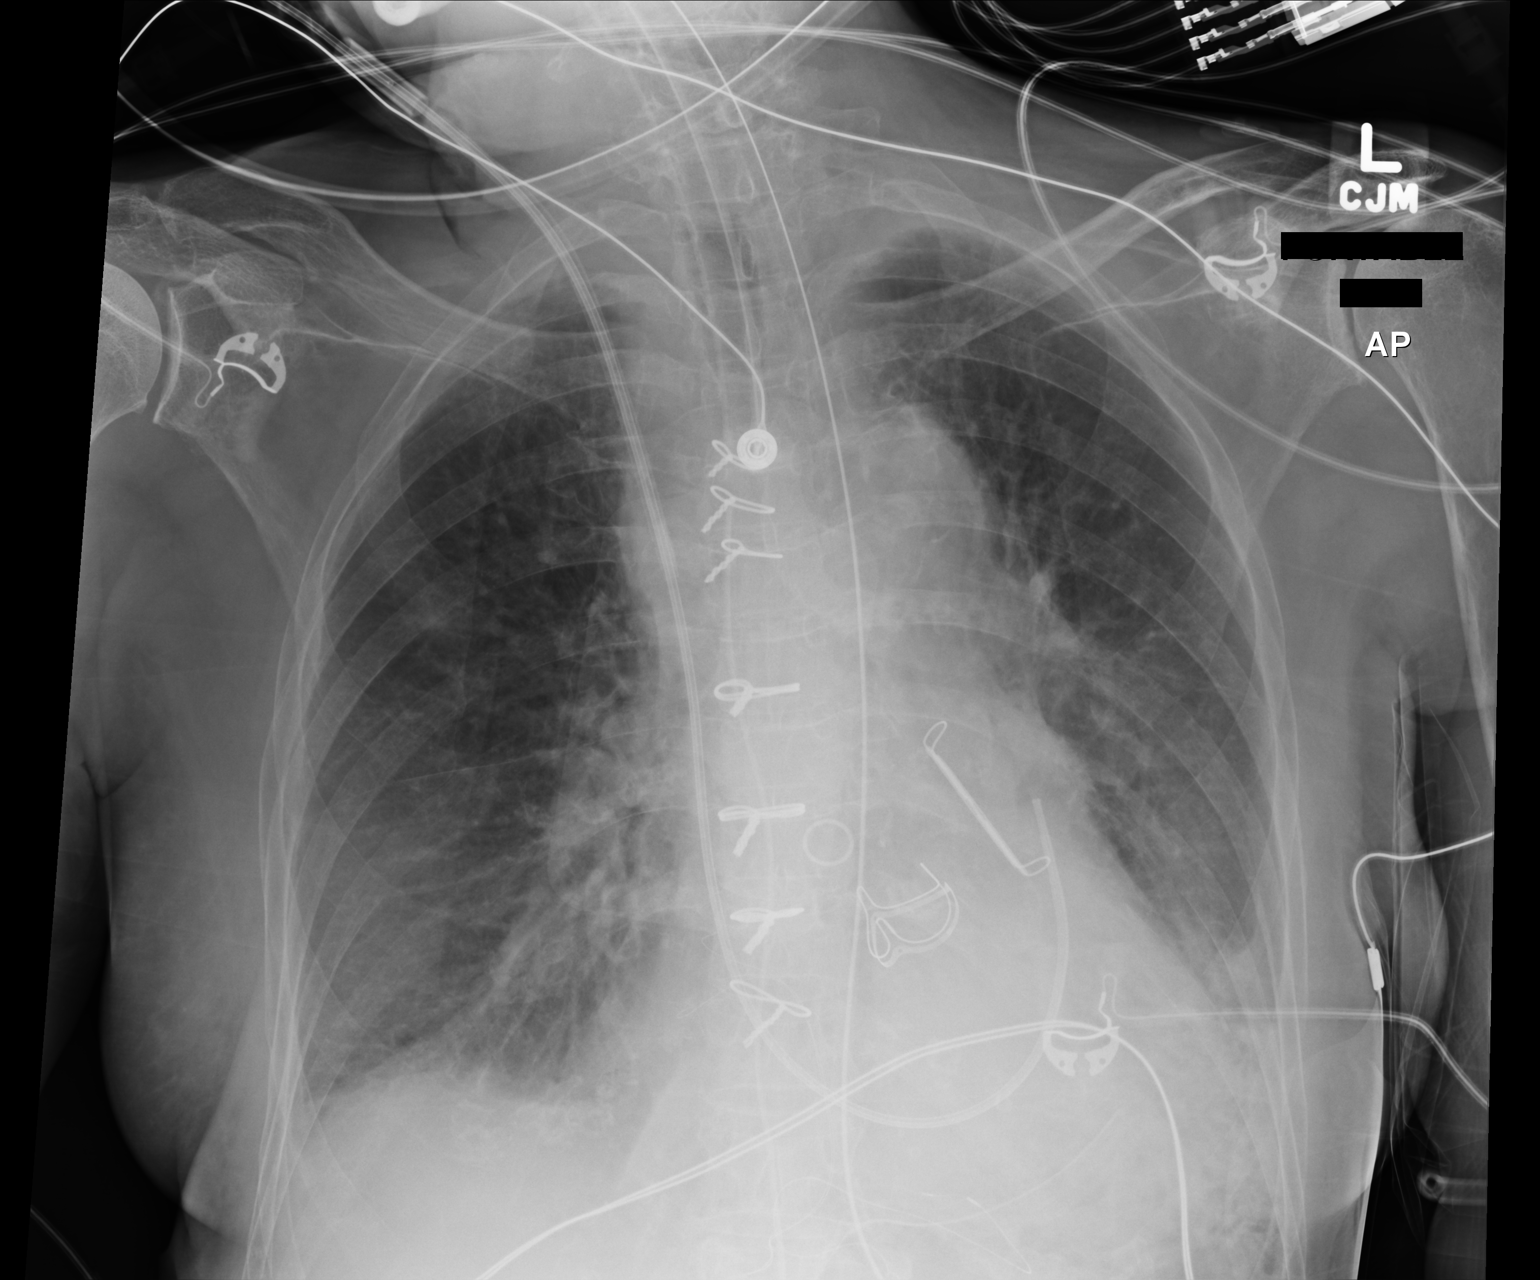

[1 of 1 positions shown; findings below may reference images not displayed]

FINDINGS: Endotracheal tube is in satisfactory position the tip
approximately 4.6 cm above the carina.  Nasogastric tube can be
followed to the distal esophagus and continues below the edge of
the image.  Right IJ sheath transmits a Swan-Ganz catheter with
distal tip in the pulmonary outflow tract. A drain/tube projects
over the left heart border.  Mediastinal drain projects just to the
right of midline and terminates cephalad to the level of the
carina.

There is stable cardiomegaly.  The patient is status post median
sternotomy for aortic valve replacement and Maze procedure.

There is mild pulmonary vascular congestion.  Patchy bibasilar
airspace disease, left greater than right suggest mild pulmonary
edema.  Small left pleural effusion is suspected.  Negative for
pneumothorax.
IMPRESSION: Head status post median sternotomy. Satisfactory position of
support devices. Pulmonary vascular congestion with probable mild
pulmonary edema.  Suspect small left pleural effusion.

## 2014-07-07 ENCOUNTER — Other Ambulatory Visit: Payer: Self-pay | Admitting: Cardiology

## 2014-07-08 IMAGING — CR DG CHEST 1V PORT
1 series · 1 of 1 positions shown · non-contrast
Comparison: 04/01/2013.

CLINICAL DATA: Postop, chest tube.

PORTABLE CHEST - 1 VIEW

[AP]
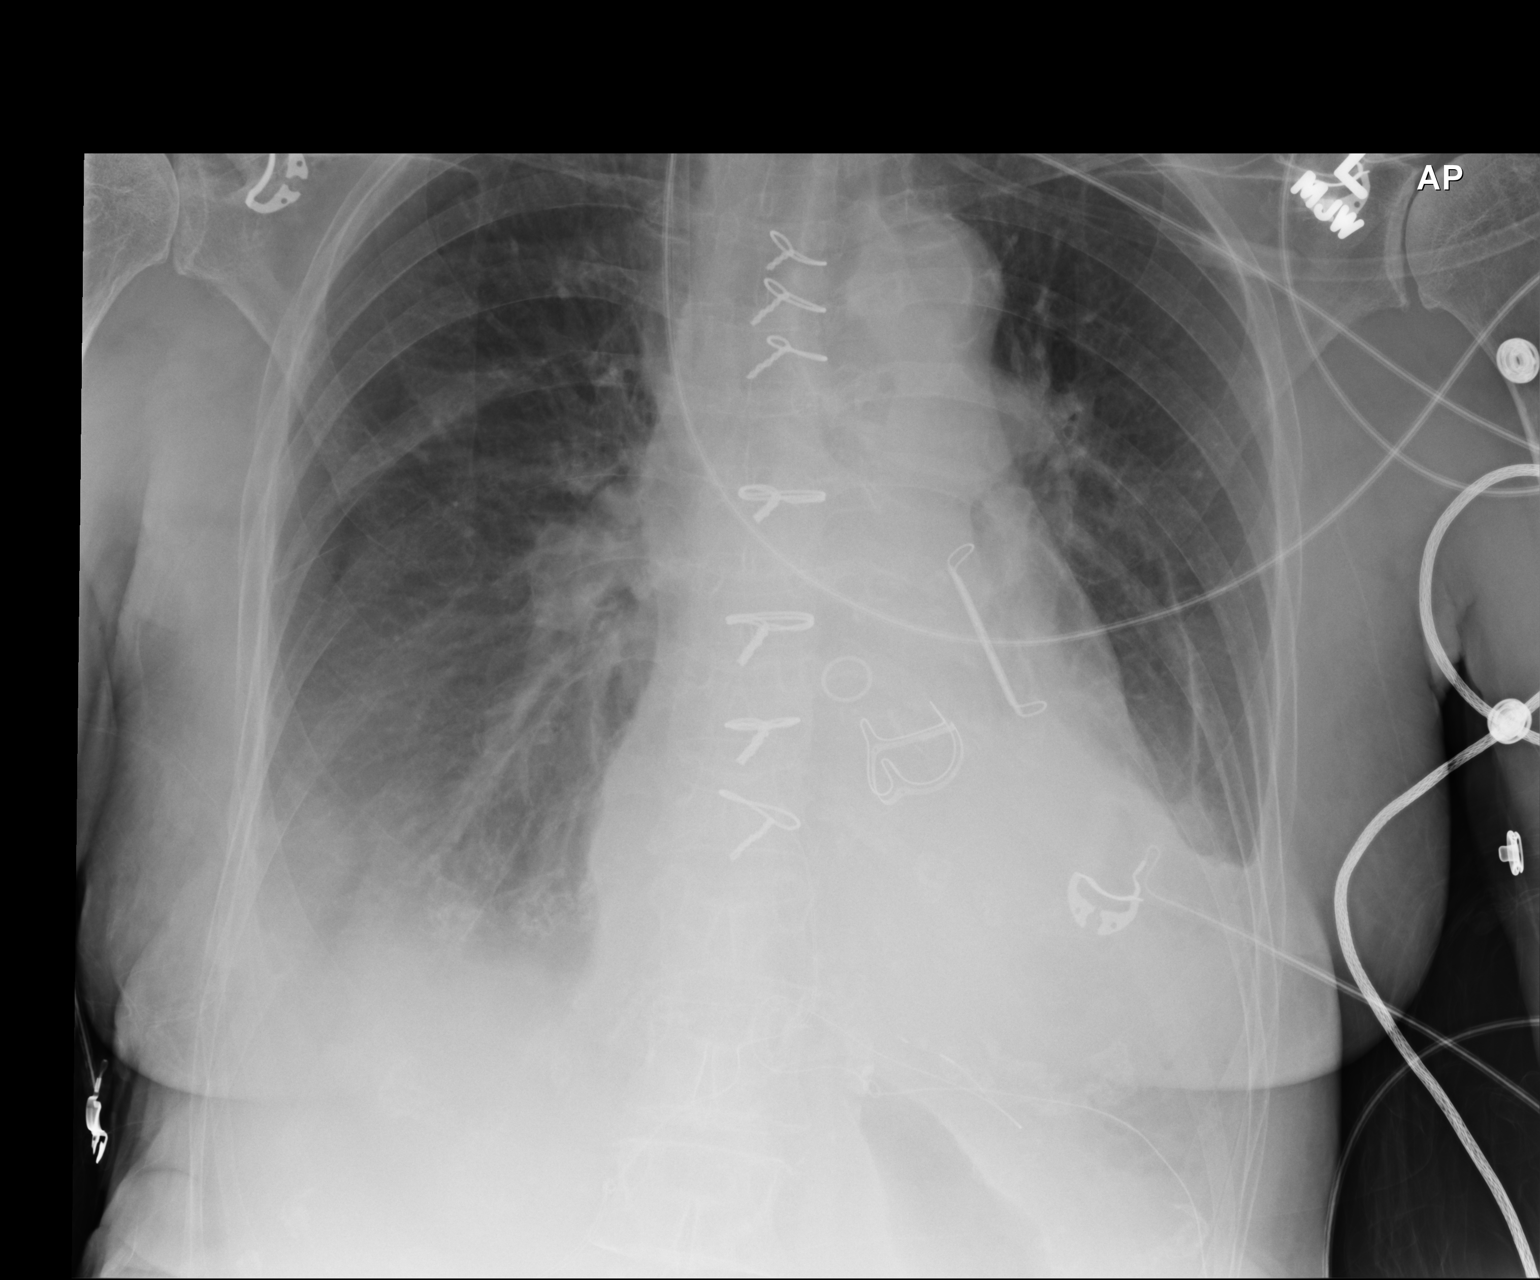

[1 of 1 positions shown; findings below may reference images not displayed]

FINDINGS: Interval extubation. Nasogastric tube, right IJ Swan-Ganz
catheter, mediastinal drain and left chest tube have been removed
in the interval.  Right IJ catheter sheath remains in place.
Sternotomy wires are unchanged in position.

Cardiomediastinal silhouette is unchanged in size and contour.
Biapical pleural thickening.  Small bilateral pleural effusions
with bibasilar air space disease, left greater than right.  No
pneumothorax.
IMPRESSION: Small bilateral pleural effusions and bibasilar air space disease,
left greater than right.

## 2014-07-11 IMAGING — CR DG CHEST 1V PORT
1 series · 1 of 1 positions shown · non-contrast
Comparison: Yesterday

CLINICAL DATA: Aortic stenosis

PORTABLE CHEST - 1 VIEW

[AP]
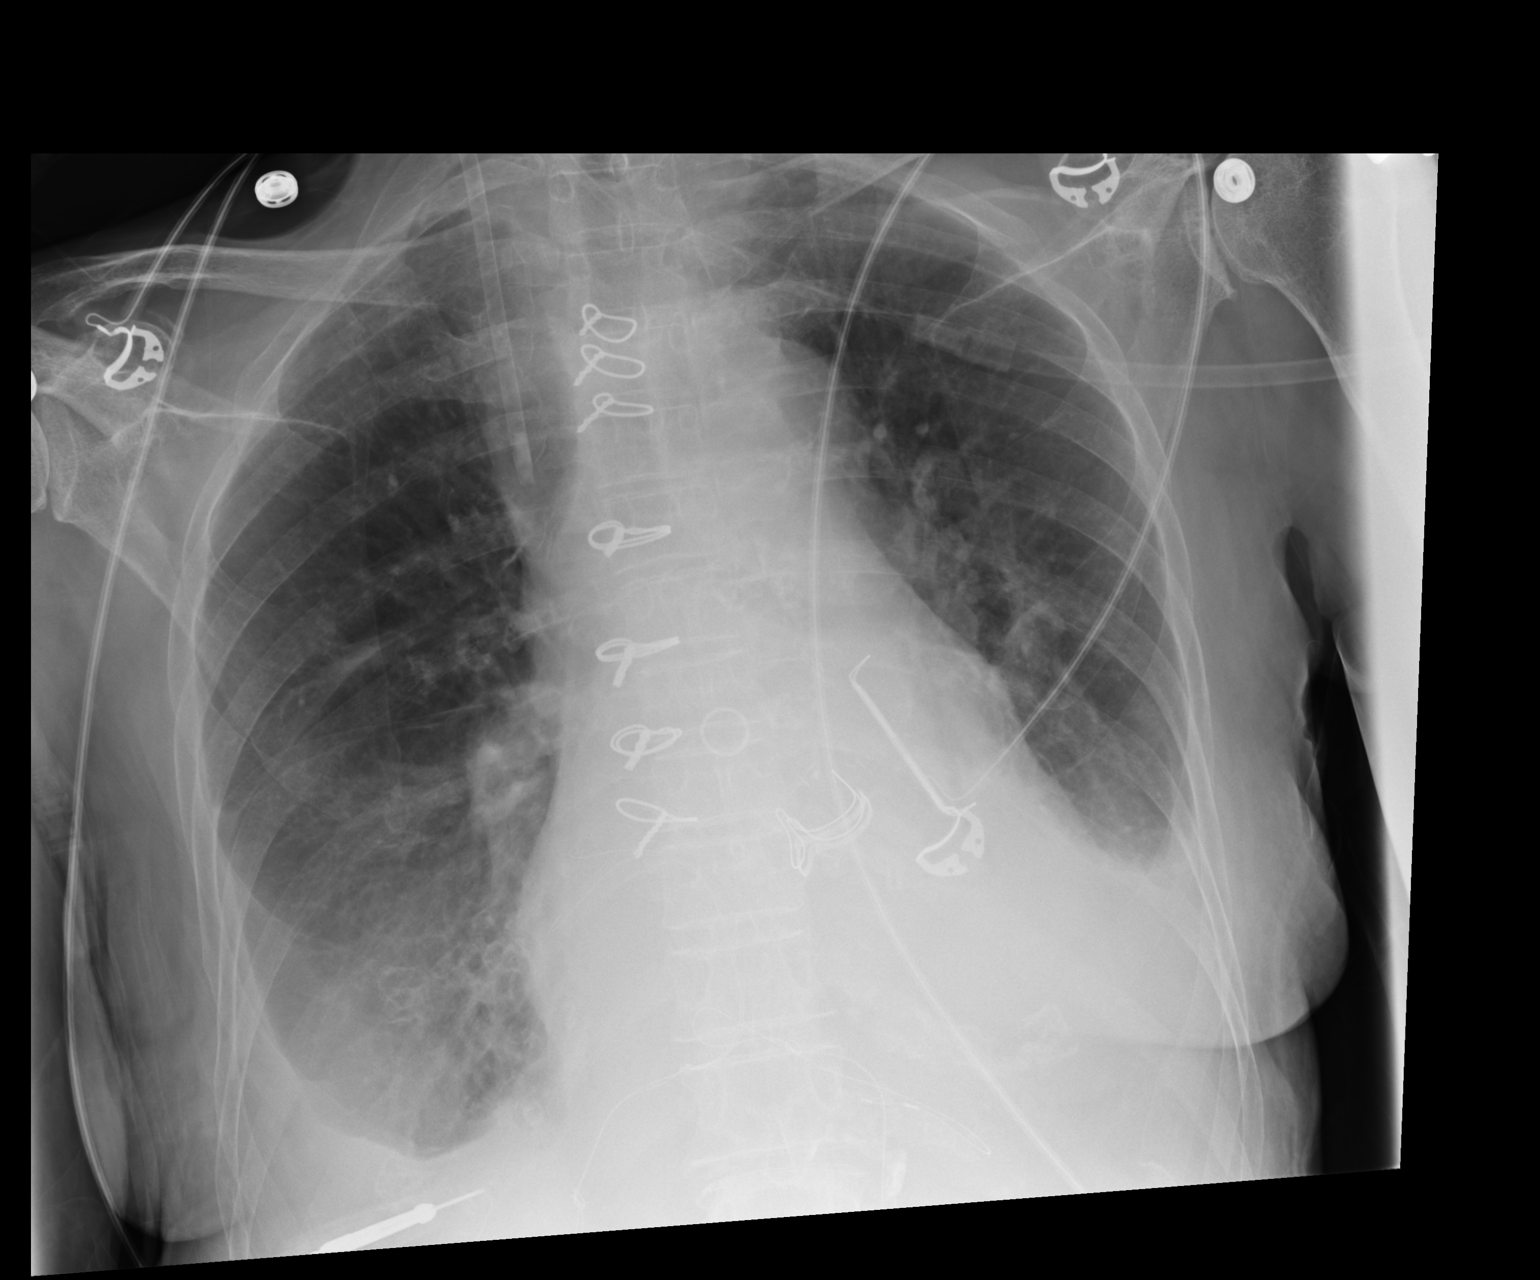

[1 of 1 positions shown; findings below may reference images not displayed]

FINDINGS: Mild cardiomegaly.  Bilateral pleural effusions left
greater than right not significantly changed.  Hazy central and
basilar atelectasis verses airspace disease.  Normal vascularity.
Percutaneous pacemaker wires.  Right internal jugular introducer
sheath.
IMPRESSION: Stable pleural effusions.

## 2014-09-09 ENCOUNTER — Other Ambulatory Visit: Payer: Self-pay | Admitting: Cardiology

## 2014-09-13 ENCOUNTER — Ambulatory Visit (INDEPENDENT_AMBULATORY_CARE_PROVIDER_SITE_OTHER): Payer: Medicare Other | Admitting: Cardiology

## 2014-09-13 VITALS — BP 130/72 | HR 60 | Ht 65.0 in | Wt 130.0 lb

## 2014-09-13 DIAGNOSIS — I5033 Acute on chronic diastolic (congestive) heart failure: Secondary | ICD-10-CM

## 2014-09-13 DIAGNOSIS — M353 Polymyalgia rheumatica: Secondary | ICD-10-CM

## 2014-09-13 DIAGNOSIS — I119 Hypertensive heart disease without heart failure: Secondary | ICD-10-CM

## 2014-09-13 DIAGNOSIS — Z954 Presence of other heart-valve replacement: Secondary | ICD-10-CM

## 2014-09-13 DIAGNOSIS — I480 Paroxysmal atrial fibrillation: Secondary | ICD-10-CM

## 2014-09-13 DIAGNOSIS — Z953 Presence of xenogenic heart valve: Secondary | ICD-10-CM

## 2014-09-13 NOTE — Assessment & Plan Note (Signed)
The patient is on long-term anticoagulation.  She is maintaining normal sinus rhythm.

## 2014-09-13 NOTE — Assessment & Plan Note (Signed)
She is not having any symptoms of congestive heart failure.

## 2014-09-13 NOTE — Progress Notes (Signed)
Kayla Chambers Date of Birth:  1927-08-18 [place] [address] [place], [place]  [address] [phone]        Fax   [phone]   History of Present Illness: This pleasant 80 year old woman is seen for a followup office visit. She underwent a Bentall procedure by Dr. Servando Chambers on 03/31/13. 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 Preoperatively she had been in and out of atrial fibrillation. More recently she has been maintaining normal sinus rhythm.  She has a past history of a sarcoma of the left thigh muscle.  This is followed at [facility] and is stable postoperatively.  She has a history of peripheral neuropathy.  She has a past history of chronic dizziness.  She has a history of hypertension.  She has treated hypothyroidism.  Since last visit she has been complaining of muscle soreness across the shoulder girdle area.  She has had a remote history of PMR.  Current Outpatient Prescriptions  Medication Sig Dispense Refill  . Ascorbic Acid (VITAMIN C PO) Take 1 tablet by mouth daily.      Marland Kitchen atorvastatin (LIPITOR) 10 MG tablet take 1 tablet by mouth once daily AT 6 PM  30 tablet  6  . b complex vitamins tablet Take 1 tablet by mouth daily.      . Calcium Carbonate-Vitamin D (CALCIUM + D PO) Take 1 tablet by mouth 2 (two) times daily.       Marland Kitchen CRANBERRY PO Take 1 tablet by mouth 2 (two) times daily.       . fish oil-omega-3 fatty acids 1000 MG capsule Take 1 g by mouth 2 (two) times daily.       Marland Kitchen HYDROcodone-acetaminophen (NORCO/VICODIN) 5-325 MG per tablet Take 1 tablet by mouth every 8 (eight) hours as needed for pain.  10 tablet  0  . lactase (LACTAID) 3000 UNITS tablet Take 1 tablet by mouth daily as needed (only when eating dairy).      Marland Kitchen levothyroxine (SYNTHROID, LEVOTHROID) 75 MCG tablet Take 75 mcg by mouth daily.         . Loperamide HCl (LOPERAMIDE A-D PO) Take 1 tablet by mouth 4 (four) times daily as needed (diarrhea).       . metoprolol (LOPRESSOR) 50 MG tablet take 1/2 tablet by mouth twice a day  90 tablet  0  . Multiple Vitamin (MULTIVITAMIN WITH MINERALS) TABS Take 1 tablet by mouth daily.      . Rivaroxaban (XARELTO) 15 MG TABS tablet Take 1 tablet (15 mg total) by mouth daily.  90 tablet  3  . ropinirole (REQUIP) 5 MG tablet Take 5 mg by mouth 3 times/day as needed-between meals & bedtime. RESTLESS LEG      . traMADol (ULTRAM) 50 MG tablet Take 50 mg by mouth every 6 (six) hours as needed for pain.      Marland Kitchen ALPRAZolam (XANAX) 0.5 MG tablet take 1/2 to 1 tablet by mouth at bedtime if needed  30 tablet  3  . furosemide (LASIX) 20 MG tablet Take 1 tablet (20 mg total) by mouth daily as needed.  30 tablet  3   No current facility-administered medications for this visit.    Allergies  Allergen Reactions  . Alendronate Sodium Nausea And Vomiting  . Ambien [Zolpidem Tartrate]     hallucinations  . Lac Bovis  Nausea Only    Drinks soy milk  . Lyrica [Pregabalin] Nausea And Vomiting  . Milk-Related Compounds Diarrhea  . Neurontin [Gabapentin] Nausea And Vomiting  . Procaine Hcl Other (See Comments)    Reaction unknown  . Toprol Xl [Metoprolol Succinate] Other (See Comments)    Does not remember. Tolerates Lopressor 04/10/13, Kayla Chambers    Patient Active Problem List   Diagnosis Date Noted  . Peripheral neuropathy 03/07/2011    Priority: High  . Aortic insufficiency 03/06/2011    Priority: Medium  . 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 Bronchitis 12/02/2013  . Malaise and fatigue 05/26/2013  . PAF (paroxysmal atrial fibrillation) 04/08/2013  . HTN (hypertension) 04/08/2013  . Chronic diastolic CHF (congestive heart failure) 04/08/2013  . CAD (coronary artery disease) 04/08/2013  . Atrial fibrillation 01/31/2013  . Acute on chronic diastolic heart failure 43/32/9518  . Disorder of liver  10/07/2012  . Solitary pulmonary nodule 10/07/2012  . 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 Elevated blood pressure 09/12/2011  . Dizziness 08/09/2011  . Dilated aortic root 03/06/2011  . Hypothyroidism 03/06/2011  . Polymyalgia rheumatica 03/06/2011    History  Smoking status  . Never Smoker   Smokeless tobacco  . Never Used    History  Alcohol Use No    Family History  Problem Relation Age of Onset  . Heart disease Mother   . Stroke Mother   . Arthritis Father   . Cancer Father   . Hypertension Sister   . Anesthesia problems Brother     Review of Systems: Constitutional: no fever chills diaphoresis or fatigue or change in weight.  Head and neck: no hearing loss, no epistaxis, no photophobia or visual disturbance. Respiratory: No cough, shortness of breath or wheezing. Cardiovascular: No chest pain peripheral edema, palpitations. Gastrointestinal: No abdominal distention, no abdominal pain, no change in bowel habits hematochezia or melena. Genitourinary: No dysuria, no frequency, no urgency, no nocturia. Musculoskeletal:No arthralgias, no back pain, no gait disturbance or myalgias. Neurological: No dizziness, no headaches, no numbness, no seizures, no syncope, no weakness, no tremors. Hematologic: No lymphadenopathy, no easy bruising. Psychiatric: No confusion, no hallucinations, no sleep disturbance.    Physical Exam: Filed Vitals:   09/13/14 1527  BP: 130/72  Pulse: 60   the general appearance reveals a well-developed well-nourished woman in no distress.The head and neck exam reveals pupils equal and reactive.  Extraocular movements are full.  There is no scleral icterus.  The mouth and pharynx are normal.  The neck is supple.  The carotids reveal no bruits.  The jugular venous pressure is normal.  The  thyroid is not enlarged.  There is no lymphadenopathy.  The chest is clear to percussion and auscultation.  There are no rales or rhonchi.   Expansion of the chest is symmetrical.  The precordium is quiet.  The first heart sound is normal.  The second heart sound is physiologically split.  There is no murmur gallop rub or click.  The aortic closure sound is crisp and there is no aortic insufficiency heard. There is no abnormal lift or heave.  The abdomen is soft and nontender.  The bowel sounds are normal.  The liver and spleen are not enlarged.  There are no abdominal masses.  There are no abdominal bruits.  Extremities reveal good pedal pulses.  There is no phlebitis or edema.  There is no cyanosis or clubbing.  Strength is normal and symmetrical in all  extremities.  There is no lateralizing weakness.  There are no sensory deficits.  The skin is warm and dry.  There is no rash.     Assessment / Plan: 1.  Status post Bentall procedure on 03/31/13 with replacement of her ascending aorta, aortic valve replacement with a bioprosthetic pericardial tissue valve, single-vessel CABG and Maze procedure. 2. Hypercholesterolemia 3. Hypothyroidism 4. essential hypertension without heart failure 5.  Coronary artery disease 6. history of paroxysmal atrial fibrillation, maintaining normal sinus rhythm, on Xarelto. 7. discomfort of shoulder girdle, rule out PMR versus adverse effect of statin therapy  Plan: Check sedimentation rate today. Continue current medication for now. We gave her a handicap parking sticker Recheck in 4 months for office visit EKG CBC and basal metabolic panel  She will return soon for a followup echocardiogram to reassess her postoperative aortic valve prosthesis.  He has been a year since her surgery. Continue same medication.  Blood work today pending.  Recheck in 4 months for office visit.  Since I last saw them they celebrated their wedding anniversary by going on a cruise to [place] with friends from their church.

## 2014-09-13 NOTE — Assessment & Plan Note (Signed)
She may be developing symptoms of recurrent polymyalgia rheumatica.  We will check a sedimentation rate.. if the sedimentation rate is normal, we will consider stopping her statin for a month or 2 and see if that makes a difference

## 2014-09-13 NOTE — Patient Instructions (Signed)
Will obtain labs today and call you with the results (SED RATE)  Your physician recommends that you continue on your current medications as directed. Please refer to the Current Medication list given to you today.  Your physician recommends that you schedule a follow-up appointment in: 4 MONTH OV/EKG/BMET/CBC

## 2014-09-14 LAB — SEDIMENTATION RATE: SED RATE: 16 mm/h (ref 0–22)

## 2014-09-15 ENCOUNTER — Telehealth: Payer: Self-pay | Admitting: Cardiology

## 2014-09-15 NOTE — Telephone Encounter (Signed)
Pt's family member called, he is concern because he refilled the Xarelto prescription today  and in the instructions given  reads "Do not take Xarelto if you have a mechanical valve replacement"  Family member  is aware that this message will be send to MD and his nurse for reviewing/recommendations.

## 2014-09-15 NOTE — Telephone Encounter (Signed)
New message       Pt is on xeralto.  She has had valve replacement. He was reading something that said patients should not take xeralto if they have had a mechanical valve replacement. Also, want lab results.

## 2014-09-16 NOTE — Telephone Encounter (Signed)
Patient has tissue valve not mechanical valve Per  Dr. Mare Ferrari continue same dose of medications  Left message to call back

## 2014-09-16 NOTE — Telephone Encounter (Signed)
Advised husband  

## 2014-09-16 NOTE — Telephone Encounter (Signed)
Agree.  We are using the Xarelto for her atrial fibrillation.  She does not need anticoagulation for her bioprosthetic aortic valve which is a tissue valve, not a mechanical valve.

## 2014-10-14 ENCOUNTER — Other Ambulatory Visit: Payer: Self-pay | Admitting: Cardiology

## 2014-10-28 ENCOUNTER — Encounter (HOSPITAL_COMMUNITY): Payer: Self-pay | Admitting: Cardiology

## 2015-01-07 ENCOUNTER — Ambulatory Visit (INDEPENDENT_AMBULATORY_CARE_PROVIDER_SITE_OTHER): Payer: Medicare Other | Admitting: Cardiology

## 2015-01-07 ENCOUNTER — Encounter: Payer: Self-pay | Admitting: Cardiology

## 2015-01-07 ENCOUNTER — Other Ambulatory Visit: Payer: Medicare Other

## 2015-01-07 VITALS — BP 118/70 | HR 59 | Ht 65.0 in | Wt 132.8 lb

## 2015-01-07 DIAGNOSIS — I10 Essential (primary) hypertension: Secondary | ICD-10-CM

## 2015-01-07 DIAGNOSIS — I480 Paroxysmal atrial fibrillation: Secondary | ICD-10-CM

## 2015-01-07 DIAGNOSIS — R42 Dizziness and giddiness: Secondary | ICD-10-CM | POA: Insufficient documentation

## 2015-01-07 DIAGNOSIS — Z954 Presence of other heart-valve replacement: Secondary | ICD-10-CM

## 2015-01-07 DIAGNOSIS — Z953 Presence of xenogenic heart valve: Secondary | ICD-10-CM

## 2015-01-07 MED ORDER — MECLIZINE HCL 25 MG PO TABS: 25.0000 mg | ORAL_TABLET | Freq: Four times a day (QID) | ORAL | Status: DC | PRN

## 2015-01-07 NOTE — Patient Instructions (Signed)
START MECLIZINE 25 MG ONE EVERY 6 HOURS AS NEEDED   STOP XARELTO   START ASPIRIN 81 MG DAILY  Your physician recommends that you schedule a follow-up appointment in: [place]

## 2015-01-07 NOTE — Progress Notes (Signed)
Cardiology Office Note   Date:  01/07/2015   ID:  Kayla Chambers 06-18-1927, MRN [id number]  PCP:  Lynne Logan, MD  Cardiologist:   Darlin Coco, MD   No chief complaint on file.     History of Present Illness: Kayla Chambers is a 81 y.o. female who presents for scheduled four-month follow-up office visit  This pleasant 81 year old woman is seen for a followup office visit. She underwent a Bentall procedure by Dr. Servando Snare on 03/31/13. 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 Preoperatively she had been in and out of atrial fibrillation. More recently she has been maintaining normal sinus rhythm.  She is no longer on amiodarone. She has a past history of a sarcoma of the left thigh muscle. This is followed at [facility] and is stable postoperatively. She has a history of peripheral neuropathy. She has a past history of chronic dizziness.  She also has benign positional vertigo and tinnitus. She has a history of hypertension. She has treated hypothyroidism. Since last visit she has been complaining of muscle soreness across the shoulder girdle area. She has had a remote history of PMR.  Dr. Charlestine Night is treating her with prednisone for PMR. She has been on long-term anticoagulation with Xarelto.  However she has been experiencing problems with vaginal bleeding, nosebleeds, and easy bruising.  She would like to stop the Xarelto now.  Since she is maintaining normal sinus rhythm off amiodarone, we will stop Xarelto and switch to a baby aspirin.  Past Medical History  Diagnosis Date  . Hypothyroidism   . 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 Dilated aortic root     a. 03/2013 s/p Bentall procedure with Ao Root replacement (54mm Valsalva graft) w/ reimplantationof the R and L coronary ostium.  Marland Kitchen Aortic  insufficiency     a. 03/2013 s/p  AVR w/ biologic 48mm Magna Ease peric tissue valve, model 300TFX, ser# [id number].   Marland Kitchen CAD (coronary artery disease)     a. 01/2013 mod calcification w/o sev dzs;  b. 03/2013 CABG x1 VG->RCA @ time of AVR  . Paroxysmal a-fib     a. Dx 01/2013 - placed on xarelto;  b. 03/2013 s/p left sided MAZE and LA clip @ time of AVR/CABG.  . Peripheral neuropathy   . Intracranial aneurysm     in late 1970's  . Osteoporosis   . Rheumatic heart disease   . Headache(784.0)     hx migraines  . Anxiety   . Fibromyalgia   . 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 Neoadjuvant RT by Dr. Yisroel Ramming. Surveillance plan: MRI 12/08/11 shows changes in presumed post-op seroma/hematoma with slight increase in size - follow up MRI 11/18/12 showed same size. Will continue with q3 month MRI. CT C/A/P annually alternating with CXR at 6 months.   . Chronic diastolic CHF (congestive heart failure)     a. 01/2013 echo: EF 50-55%.  . Vertigo   . PMR (polymyalgia rheumatica)     Inactive    Past Surgical History  Procedure Laterality Date  . Sarcoma resection  2011  . Abdominal hysterectomy    . Bladder suspension    . Colonoscopy  10/25/2011    Procedure: COLONOSCOPY;  Surgeon: Garlan Fair, MD;  Location: [facility];  Service: Endoscopy;  Laterality: N/A;  . Eye surgery Bilateral     cataracts  . Ascending aortic root replacement N/A 03/31/2013    Procedure: ASCENDING AORTIC ROOT REPLACEMENT;  Surgeon: Grace Isaac, MD;  Location: [place];  Service: Open Heart Surgery;  Laterality: N/A;  . Intraoperative transesophageal echocardiogram N/A 03/31/2013    Procedure: INTRAOPERATIVE TRANSESOPHAGEAL ECHOCARDIOGRAM;  Surgeon: Grace Isaac, MD;  Location: [place];  Service: Open Heart Surgery;  Laterality: N/A;  . Maze N/A 03/31/2013    Procedure: MAZE;  Surgeon: Grace Isaac, MD;  Location: [facility];  Service: Open Heart Surgery;  Laterality: N/A;  . Left and right heart catheterization with coronary angiogram Right 02/02/2013    Procedure: LEFT AND RIGHT HEART CATHETERIZATION WITH CORONARY ANGIOGRAM;  Surgeon: Hillary Bow, MD;  Location: [facility] CATH LAB;  Service: Cardiovascular;  Laterality: Right;     Current Outpatient Prescriptions  Medication Sig Dispense Refill  . ALPRAZolam (XANAX) 0.5 MG tablet take 1/2 to 1 tablet by mouth at bedtime if needed 30 tablet 3  . Ascorbic Acid (VITAMIN C PO) Take 1 tablet by mouth daily.    Marland Kitchen aspirin 81 MG tablet Take 81 mg by mouth daily.    Marland Kitchen b complex vitamins tablet Take 1 tablet by mouth daily.    . Calcium Carbonate-Vitamin D (CALCIUM + D PO) Take 1 tablet by mouth 3 (three) times daily.     Marland Kitchen CRANBERRY PO Take 1 tablet by mouth 2 (two) times daily.     Marland Kitchen lactase (LACTAID) 3000 UNITS tablet Take 1 tablet by mouth daily as needed (only when eating dairy).    Marland Kitchen levothyroxine (SYNTHROID, LEVOTHROID) 75 MCG tablet Take 75 mcg by mouth daily.      . Loperamide HCl (LOPERAMIDE A-D PO) Take 1 tablet by mouth 4 (four) times daily as needed (diarrhea).     . metoprolol (LOPRESSOR) 50 MG tablet take 1/2 tablet by mouth twice a day 90 tablet 0  . Multiple Vitamin (MULTIVITAMIN WITH MINERALS) TABS Take 1 tablet by mouth daily.    . predniSONE (DELTASONE) 10 MG tablet Take 10 mg by mouth daily with breakfast.    . ropinirole (REQUIP) 5 MG tablet Take 5 mg by mouth 3 times/day as needed-between meals & bedtime. RESTLESS LEG    . meclizine (ANTIVERT) 25 MG tablet Take 1 tablet (25 mg total) by mouth every 6 (six) hours as needed for dizziness. 30 tablet 1   No current facility-administered medications for this visit.    Allergies:   Alendronate sodium; Ambien; Lac bovis; Lyrica; Milk-related compounds; Neurontin; Procaine hcl; and Toprol xl    Social History:  The patient  reports that she has never smoked. She has never used smokeless tobacco. She  reports that she does not drink alcohol or use illicit drugs.   Family History:  The patient's family history includes Anesthesia problems in her brother; Arthritis in her father; Cancer in her father; Heart disease in her mother; Hypertension in her sister; Stroke in her mother.    ROS:  Please see the history of present illness.   Otherwise, review of systems are positive for none.   All other systems are reviewed and negative.    PHYSICAL EXAM: VS:  BP 118/70 mmHg  Pulse 59  Ht 5\' 5"  (1.651 m)  Wt 132 lb 12.8 oz (60.238 kg)  BMI 22.10 kg/m2 , BMI Body mass  index is 22.1 kg/(m^2). GEN: Well nourished, well developed, in no acute distress HEENT: normal Neck: no JVD, carotid bruits, or masses Cardiac: RRR; soft systolic ejection murmur across the prosthetic aortic valve.  No diastolic murmur.  No, rubs, or gallops,no edema  Respiratory:  clear to auscultation bilaterally, normal work of breathing GI: soft, nontender, nondistended, + BS MS: no deformity or atrophy Skin: warm and dry, no rash Neuro:  Strength and sensation are intact Psych: euthymic mood, full affect   EKG:  EKG is ordered today. The ekg ordered today demonstrates sinus bradycardia with left axis deviation and nonspecific ST-T wave changes.   Recent Labs: 04/23/2014: ALT 15; BUN 15; Creatinine 0.7; Hemoglobin 14.1; Platelets 140.0*; Potassium 5.4*; Sodium 140; TSH 2.24    Lipid Panel    Component Value Date/Time   CHOL 148 04/23/2014 0932   TRIG 49.0 04/23/2014 0932   HDL 64.30 04/23/2014 0932   CHOLHDL 2 04/23/2014 0932   VLDL 9.8 04/23/2014 0932   LDLCALC 74 04/23/2014 0932      Wt Readings from Last 3 Encounters:  01/07/15 132 lb 12.8 oz (60.238 kg)  09/13/14 130 lb (58.968 kg)  04/23/14 128 lb 12.8 oz (58.423 kg)         ASSESSMENT AND PLAN:  1. Status post Bentall procedure on 03/31/13 with replacement of her ascending aorta, aortic valve replacement with a bioprosthetic pericardial tissue  valve, single-vessel CABG and Maze procedure. 2. Hypercholesterolemia 3. Hypothyroidism 4. essential hypertension without heart failure 5. Coronary artery disease 6. history of paroxysmal atrial fibrillation, maintaining normal sinus rhythm, on Xarelto. 7. discomfort of shoulder girdle, rule out PMR versus adverse effect of statin therapy   Current medicines are reviewed at length with the patient today.  The patient has concerns regarding medicines.  Because of problems with hemorrhage we are stopping Xarelto.  The following changes have been made:  Stop Xarelto and start aspirin 81 mg daily.  For her vertigo, she will have meclizine 25 mg every 6 hours when necessary on hand  Labs/ tests ordered today include:   Orders Placed This Encounter  Procedures  . EKG 12-Lead     Disposition:   FU with Dr. Mare Ferrari in 4 months for office visit and EKG.  She will be getting her and physical exam with Dr. Nancy Fetter soon and she will be getting blood work at that time to check on cholesterol and electrolytes etc. as well as thyroid function.   Signed, Darlin Coco, MD  01/07/2015 2:17 PM    [facility] [address] [place], [place]  [address] Phone: [phone]; Fax: [phone]

## 2015-01-13 ENCOUNTER — Other Ambulatory Visit: Payer: Self-pay | Admitting: Cardiology

## 2015-01-24 ENCOUNTER — Other Ambulatory Visit (HOSPITAL_COMMUNITY): Payer: Self-pay | Admitting: Rheumatology

## 2015-01-24 ENCOUNTER — Encounter (HOSPITAL_COMMUNITY): Payer: Self-pay

## 2015-01-24 ENCOUNTER — Ambulatory Visit (HOSPITAL_COMMUNITY)
Admission: RE | Admit: 2015-01-24 | Discharge: 2015-01-24 | Disposition: A | Discharge status: Home / Self Care | Payer: Medicare Other | Source: Ambulatory Visit | Attending: Rheumatology | Admitting: Rheumatology

## 2015-01-24 DIAGNOSIS — M810 Age-related osteoporosis without current pathological fracture: Secondary | ICD-10-CM | POA: Diagnosis not present

## 2015-01-24 DIAGNOSIS — M818 Other osteoporosis without current pathological fracture: Secondary | ICD-10-CM | POA: Diagnosis present

## 2015-01-24 MED ORDER — SODIUM CHLORIDE 0.9 % IV SOLN
Freq: Once | INTRAVENOUS | Status: AC
Start: 2015-01-24 — End: 2015-01-24
  Administered 2015-01-24: 13:00:00 via INTRAVENOUS

## 2015-01-24 MED ORDER — ZOLEDRONIC ACID 5 MG/100ML IV SOLN
5.0000 mg | Freq: Once | INTRAVENOUS | Status: AC
  Administered 2015-01-24: 5 mg via INTRAVENOUS
  Filled 2015-01-24: qty 100

## 2015-01-24 NOTE — Progress Notes (Signed)
reclast given.  D/c papers on reclast given to pt.  Pt is currently taking vit.D and calcium at home as daily supplements.

## 2015-01-24 NOTE — Discharge Instructions (Signed)

## 2015-03-09 ENCOUNTER — Telehealth: Payer: Self-pay | Admitting: Cardiology

## 2015-03-09 NOTE — Telephone Encounter (Signed)
New message     1. What dental office are you calling from? Dr Mauro Kaufmann  2. What is your office phone and fax number? Fax [phone]   What type of procedure is the patient having performed?  Pt coming in with a broken tooth ----not sure what will be done 3. What date is procedure scheduled? 03-10-15 at 9:45  4. What is your question (ex. Antibiotics prior to procedure, holding medication-we need to know how long dentist wants pt to hold med)?  Need pre med protocol to have on file for this pt----she will be a new pt tomorrow

## 2015-03-09 NOTE — Telephone Encounter (Signed)
Will forward to  Dr. Brackbill for review 

## 2015-03-10 NOTE — Telephone Encounter (Signed)
Faxed information and confirmed with Kennyth Lose received

## 2015-03-10 NOTE — Telephone Encounter (Signed)
Okay to proceed with dental procedure.  She needs SBE prophylaxis with amoxicillin 500 mg tablets and take 4 tablets one hour prior to procedure. She is on daily aspirin 81 mg which she should continue.

## 2015-04-25 ENCOUNTER — Encounter: Payer: Self-pay | Admitting: Cardiology

## 2015-04-25 ENCOUNTER — Ambulatory Visit (INDEPENDENT_AMBULATORY_CARE_PROVIDER_SITE_OTHER): Payer: Medicare Other | Admitting: Cardiology

## 2015-04-25 VITALS — BP 128/72 | HR 61 | Ht 64.0 in | Wt 134.0 lb

## 2015-04-25 DIAGNOSIS — Z954 Presence of other heart-valve replacement: Secondary | ICD-10-CM | POA: Diagnosis not present

## 2015-04-25 DIAGNOSIS — M353 Polymyalgia rheumatica: Secondary | ICD-10-CM | POA: Diagnosis not present

## 2015-04-25 DIAGNOSIS — Z953 Presence of xenogenic heart valve: Secondary | ICD-10-CM

## 2015-04-25 DIAGNOSIS — I480 Paroxysmal atrial fibrillation: Secondary | ICD-10-CM

## 2015-04-25 DIAGNOSIS — I10 Essential (primary) hypertension: Secondary | ICD-10-CM | POA: Diagnosis not present

## 2015-04-25 NOTE — Patient Instructions (Signed)
Medication Instructions:  Your physician recommends that you continue on your current medications as directed. Please refer to the Current Medication list given to you today.  Labwork: NONE  Testing/Procedures: NONE  Follow-Up: Your physician wants you to follow-up in: 4 months with fasting labs (lp/bmet/hfp/CBC/TSH)  You will receive a reminder letter in the mail two months in advance. If you don't receive a letter, please call our office to schedule the follow-up appointment.

## 2015-04-25 NOTE — Progress Notes (Signed)
Cardiology Office Note   Date:  04/25/2015   ID:  Shavy, Beachem [birth year], MRN [id number]  PCP:  Lynne Logan, MD  Cardiologist: Darlin Coco MD  No chief complaint on file.     History of Present Illness: Kayla Chambers is a 81 y.o. female who presents for a four-month follow-up office visit.  This pleasant 81 year old woman is seen for a followup office visit. She underwent a Bentall procedure by Dr. Servando Snare on 03/31/13. 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 Preoperatively she had been in and out of atrial fibrillation. More recently she has been maintaining normal sinus rhythm. She is no longer on amiodarone.  At her last visit her Xarelto was stopped because of excessive bruising and she remains on aspirin alone. She has a past history of a sarcoma of the left thigh muscle. This is followed at [facility] and is stable postoperatively. She has a history of peripheral neuropathy.  She is seeing a chiropractor about alternative therapies for her peripheral neuropathy. She has a past history of chronic dizziness. She also has benign positional vertigo and tinnitus. She has a history of hypertension. She has treated hypothyroidism. The patient has PMR and is on low-dose prednisone.  Dr. Charlestine Night is her rheumatologist  Past Medical History  Diagnosis Date  . Hypothyroidism   . 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 Dilated aortic root     a. 03/2013 s/p Bentall procedure with Ao Root replacement (58mm Valsalva graft) w/ reimplantationof the R and L coronary ostium.  Marland Kitchen Aortic insufficiency     a. 03/2013 s/p  AVR w/ biologic 73mm Magna Ease peric tissue valve, model 300TFX, ser# [id number].   Marland Kitchen CAD (coronary artery disease)     a. 01/2013 mod calcification w/o sev dzs;  b. 03/2013 CABG x1 VG->RCA @  time of AVR  . Paroxysmal a-fib     a. Dx 01/2013 - placed on xarelto;  b. 03/2013 s/p left sided MAZE and LA clip @ time of AVR/CABG.  . Peripheral neuropathy   . Intracranial aneurysm     in late 1970's  . Osteoporosis   . Rheumatic heart disease   . Headache(784.0)     hx migraines  . Anxiety   . Fibromyalgia   . 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 Neoadjuvant RT by Dr. Yisroel Ramming. Surveillance plan: MRI 12/08/11 shows changes in presumed post-op seroma/hematoma with slight increase in size - follow up MRI 11/18/12 showed same size. Will continue with q3 month MRI. CT C/A/P annually alternating with CXR at 6 months.   . Chronic diastolic CHF (congestive heart failure)     a. 01/2013 echo: EF 50-55%.  . Vertigo   . PMR (polymyalgia rheumatica)     Inactive    Past Surgical History  Procedure Laterality Date  . Sarcoma resection  2011  . Abdominal hysterectomy    . Bladder suspension    . Colonoscopy  10/25/2011    Procedure: COLONOSCOPY;  Surgeon: Garlan Fair, MD;  Location: [facility];  Service: Endoscopy;  Laterality: N/A;  . Eye surgery Bilateral     cataracts  . Ascending aortic root replacement N/A 03/31/2013    Procedure: ASCENDING AORTIC ROOT REPLACEMENT;  Surgeon: Grace Isaac, MD;  Location: [place];  Service: Open Heart Surgery;  Laterality: N/A;  . Intraoperative transesophageal echocardiogram N/A 03/31/2013    Procedure: INTRAOPERATIVE TRANSESOPHAGEAL ECHOCARDIOGRAM;  Surgeon: Grace Isaac, MD;  Location: [place];  Service: Open Heart Surgery;  Laterality: N/A;  . Maze N/A 03/31/2013    Procedure: MAZE;  Surgeon: Grace Isaac, MD;  Location: [place];  Service: Open Heart Surgery;  Laterality: N/A;  . Left and right heart catheterization with coronary angiogram Right 02/02/2013    Procedure: LEFT AND RIGHT HEART CATHETERIZATION WITH CORONARY ANGIOGRAM;  Surgeon:  Hillary Bow, MD;  Location: [facility] CATH LAB;  Service: Cardiovascular;  Laterality: Right;     Current Outpatient Prescriptions  Medication Sig Dispense Refill  . ALPRAZolam (XANAX) 0.5 MG tablet Take 0.5 mg by mouth at bedtime as needed for anxiety (for sleep).    . Ascorbic Acid (VITAMIN C PO) Take 1 tablet by mouth daily.    Marland Kitchen aspirin 81 MG tablet Take 81 mg by mouth daily.    Marland Kitchen b complex vitamins tablet Take 1 tablet by mouth daily.    . Calcium Carbonate-Vitamin D (CALCIUM + D PO) Take 1 tablet by mouth 3 (three) times daily.     Marland Kitchen CRANBERRY PO Take 1 tablet by mouth 2 (two) times daily.     Marland Kitchen lactase (LACTAID) 3000 UNITS tablet Take 1 tablet by mouth daily as needed (only when eating dairy).    Marland Kitchen levothyroxine (SYNTHROID, LEVOTHROID) 75 MCG tablet Take 75 mcg by mouth daily.      . Loperamide HCl (LOPERAMIDE A-D PO) Take 1 tablet by mouth 4 (four) times daily as needed (diarrhea).     . meclizine (ANTIVERT) 25 MG tablet Take 1 tablet (25 mg total) by mouth every 6 (six) hours as needed for dizziness. 30 tablet 1  . metoprolol (LOPRESSOR) 50 MG tablet take 1/2 tablet by mouth twice a day 90 tablet 3  . Multiple Vitamin (MULTIVITAMIN WITH MINERALS) TABS Take 1 tablet by mouth daily.    . predniSONE (DELTASONE) 5 MG tablet Take 5 mg by mouth daily.  0  . ropinirole (REQUIP) 5 MG tablet Take 5 mg by mouth 3 times/day as needed-between meals & bedtime. RESTLESS LEG     No current facility-administered medications for this visit.    Allergies:   Alendronate sodium; Ambien; Lac bovis; Lyrica; Milk-related compounds; Neurontin; Procaine hcl; and Toprol xl    Social History:  The patient  reports that she has never smoked. She has never used smokeless tobacco. She reports that she does not drink alcohol or use illicit drugs.   Family History:  The patient's family history includes Anesthesia problems in her brother; Arthritis in her father; Cancer in her father; Heart disease in her  mother; Hypertension in her sister; Stroke in her mother.    ROS:  Please see the history of present illness.   Otherwise, review of systems are positive for none.   All other systems are reviewed and negative.    PHYSICAL EXAM: VS:  BP 128/72 mmHg  Pulse 61  Ht 5\' 4"  (1.626 m)  Wt 134 lb (60.782 kg)  BMI 22.99 kg/m2 , BMI Body mass index is 22.99 kg/(m^2). GEN: Well nourished, well developed, in no acute distress HEENT: normal Neck: no JVD, carotid bruits, or masses Cardiac: RRR; no murmurs, rubs, or gallops,no edema  Respiratory:  clear to auscultation bilaterally, normal work of breathing GI: soft, nontender, nondistended, +  BS MS: no deformity or atrophy Skin: warm and dry, no rash Neuro:  Strength and sensation are intact Psych: euthymic mood, full affect   EKG:  EKG is ordered today. The ekg ordered today demonstrates normal sinus rhythm.  Nonspecific ST-T wave abnormalities.  Since previous tracing of 01/07/15, no significant change.   Recent Labs: No results found for requested labs within last 365 days.    Lipid Panel    Component Value Date/Time   CHOL 148 04/23/2014 0932   TRIG 49.0 04/23/2014 0932   HDL 64.30 04/23/2014 0932   CHOLHDL 2 04/23/2014 0932   VLDL 9.8 04/23/2014 0932   LDLCALC 74 04/23/2014 0932      Wt Readings from Last 3 Encounters:  04/25/15 134 lb (60.782 kg)  01/07/15 132 lb 12.8 oz (60.238 kg)  09/13/14 130 lb (58.968 kg)         ASSESSMENT AND PLAN:  1. Status post Bentall procedure on 03/31/13 with replacement of her ascending aorta, aortic valve replacement with a bioprosthetic pericardial tissue valve, single-vessel CABG and Maze procedure. 2. Hypercholesterolemia 3. Hypothyroidism 4. essential hypertension without heart failure 5. Coronary artery disease 6. history of paroxysmal atrial fibrillation, maintaining normal sinus rhythm, on Xarelto. 7.  Polymyalgia rheumatica, on low-dose prednisone 8.  Idiopathic peripheral  neuropathy 9.  Past history of sarcoma of left thigh      Current medicines are reviewed at length with the patient today.  The patient does not have concerns regarding medicines.  The following changes have been made:  no change  Labs/ tests ordered today include:   Orders Placed This Encounter  Procedures  . Lipid panel  . Hepatic function panel  . Basic metabolic panel  . CBC with Differential/Platelet  . TSH  . EKG 12-Lead   Disposition: Into Current medication.  Recheck in 4 months for office visit CBC lipid panel hepatic function panel basal metabolic panel and TSH   Signed, Darlin Coco MD 04/25/2015 12:44 PM    [facility] [address], [place], [place]  [address] Phone: [phone]; Fax: [phone]

## 2015-09-02 ENCOUNTER — Ambulatory Visit (INDEPENDENT_AMBULATORY_CARE_PROVIDER_SITE_OTHER): Payer: Medicare Other | Admitting: Cardiology

## 2015-09-02 ENCOUNTER — Encounter: Payer: Self-pay | Admitting: Cardiology

## 2015-09-02 VITALS — BP 112/60 | HR 61 | Ht 65.0 in | Wt 142.4 lb

## 2015-09-02 DIAGNOSIS — Z954 Presence of other heart-valve replacement: Secondary | ICD-10-CM | POA: Diagnosis not present

## 2015-09-02 DIAGNOSIS — Z953 Presence of xenogenic heart valve: Secondary | ICD-10-CM

## 2015-09-02 DIAGNOSIS — I10 Essential (primary) hypertension: Secondary | ICD-10-CM | POA: Diagnosis not present

## 2015-09-02 NOTE — Progress Notes (Signed)
Cardiology Office Note   Date:  09/02/2015   ID:  Aune, Adami [birth year], MRN [id number]  PCP:  Lynne Logan, MD  Cardiologist: Darlin Coco MD  No chief complaint on file.     History of Present Illness: Kayla Chambers is a 81 y.o. female who presents for a four-month follow-up visit  This pleasant 81 year old woman is seen for a followup office visit. She underwent a Bentall procedure by Dr. Servando Snare on 03/31/13. 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 Preoperatively she had been in and out of atrial fibrillation. More recently she has been maintaining normal sinus rhythm. She is no longer on amiodarone. At her last visit her Xarelto was stopped because of excessive bruising and she remains on aspirin alone. She has a past history of a sarcoma of the left thigh muscle. This is [facility] at [facility] and is stable postoperatively. She has a history of peripheral neuropathy. She is seeing a chiropractor about alternative therapies for her peripheral neuropathy. She has a past history of chronic dizziness. She also has benign positional vertigo and tinnitus. She has a history of hypertension. She has treated hypothyroidism. The patient has PMR and is on low-dose prednisone. Dr. Charlestine Night is her rheumatologist.  She will discuss with him about whether she should continue to take her calcium carbonate tablets or not  Past Medical History  Diagnosis Date  . Hypothyroidism   . Sarcoma [facility])     radiation & left lower extremity s/p resection in Feb 2012  . Dilated aortic root ([place])     a. 03/2013 s/p Bentall procedure with Ao Root replacement (78mm Valsalva graft) w/ reimplantationof the R and L coronary ostium.  Marland Kitchen Aortic insufficiency     a. 03/2013 s/p  AVR w/ biologic 24mm Magna Ease peric tissue valve, model 300TFX, ser#  [id number].   Marland Kitchen CAD (coronary artery disease)     a. 01/2013 mod calcification w/o sev dzs;  b. 03/2013 CABG x1 VG->RCA @ time of AVR  . Paroxysmal a-fib ([place])     a. Dx 01/2013 - placed on xarelto;  b. 03/2013 s/p left sided MAZE and LA clip @ time of AVR/CABG.  . Peripheral neuropathy ([place])   . Intracranial aneurysm     in late 1970's  . Osteoporosis   . Rheumatic heart disease   . Headache(784.0)     hx migraines  . Anxiety   . Fibromyalgia   . 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 Neoadjuvant RT by Dr. Yisroel Ramming. Surveillance plan: MRI 12/08/11 shows changes in presumed post-op seroma/hematoma with slight increase in size - follow up MRI 11/18/12 showed same size. Will continue with q3 month MRI. CT C/A/P annually alternating with CXR at 6 months.   . Chronic diastolic CHF (congestive heart failure) ([place])     a. 01/2013 echo: EF 50-55%.  . Vertigo   . PMR (polymyalgia rheumatica) ([facility])     Inactive    Past Surgical History  Procedure Laterality Date  . Sarcoma resection  2011  . Abdominal hysterectomy    . Bladder suspension    . Colonoscopy  10/25/2011    Procedure: COLONOSCOPY;  Surgeon: Garlan Fair, MD;  [facility]: [facility];  Service: Endoscopy;  Laterality: N/A;  . Eye surgery  Bilateral     cataracts  . Ascending aortic root replacement N/A 03/31/2013    Procedure: ASCENDING AORTIC ROOT REPLACEMENT;  Surgeon: Grace Isaac, MD;  Location: [place];  Service: Open Heart Surgery;  Laterality: N/A;  . Intraoperative transesophageal echocardiogram N/A 03/31/2013    Procedure: INTRAOPERATIVE TRANSESOPHAGEAL ECHOCARDIOGRAM;  Surgeon: Grace Isaac, MD;  Location: [place];  Service: Open Heart Surgery;  Laterality: N/A;  . Maze N/A 03/31/2013    Procedure: MAZE;  Surgeon: Grace Isaac, MD;  Location: [place];  Service: Open Heart Surgery;  Laterality: N/A;  . Left and right heart  catheterization with coronary angiogram Right 02/02/2013    Procedure: LEFT AND RIGHT HEART CATHETERIZATION WITH CORONARY ANGIOGRAM;  Surgeon: Hillary Bow, MD;  Location: [facility] CATH LAB;  Service: Cardiovascular;  Laterality: Right;     Current Outpatient Prescriptions  Medication Sig Dispense Refill  . ALPRAZolam (XANAX) 0.5 MG tablet Take 0.5 mg by mouth at bedtime as needed for anxiety (for sleep).    . Ascorbic Acid (VITAMIN C PO) Take 1 tablet by mouth daily.    Marland Kitchen aspirin 81 MG tablet Take 81 mg by mouth daily.    Marland Kitchen b complex vitamins tablet Take 1 tablet by mouth daily.    . Calcium Carbonate-Vitamin D (CALCIUM + D PO) Take 1 tablet by mouth 3 (three) times daily.     Marland Kitchen CRANBERRY PO Take 1 tablet by mouth 2 (two) times daily.     Marland Kitchen lactase (LACTAID) 3000 UNITS tablet Take 1 tablet by mouth daily as needed (only when eating dairy).    Marland Kitchen levothyroxine (SYNTHROID, LEVOTHROID) 75 MCG tablet Take 75 mcg by mouth daily.      . Loperamide HCl (LOPERAMIDE A-D PO) Take 1 tablet by mouth 4 (four) times daily as needed (diarrhea).     . meclizine (ANTIVERT) 25 MG tablet Take 1 tablet (25 mg total) by mouth every 6 (six) hours as needed for dizziness. 30 tablet 1  . metoprolol (LOPRESSOR) 50 MG tablet take 1/2 tablet by mouth twice a day 90 tablet 3  . Multiple Vitamin (MULTIVITAMIN WITH MINERALS) TABS Take 1 tablet by mouth daily.    . predniSONE (DELTASONE) 5 MG tablet Take 5 mg by mouth every other day.   0  . ropinirole (REQUIP) 5 MG tablet Take 5 mg by mouth 3 times/day as needed-between meals & bedtime. RESTLESS LEG     No current facility-administered medications for this visit.    Allergies:   Alendronate sodium; Ambien; Lac bovis; Lyrica; Milk-related compounds; Neurontin; Procaine hcl; and Toprol xl    Social History:  The patient  reports that she has never smoked. She has never used smokeless tobacco. She reports that she does not drink alcohol or use illicit drugs.   Family  History:  The patient's family history includes Anesthesia problems in her brother; Arthritis in her father; Cancer in her father; Heart disease in her mother; Hypertension in her sister; Stroke in her mother.    ROS:  Please see the history of present illness.   Otherwise, review of systems are positive for none.   All other systems are reviewed and negative.    PHYSICAL EXAM: VS:  BP 112/60 mmHg  Pulse 61  Ht 5\' 5"  (1.651 m)  Wt 142 lb 6.4 oz (64.592 kg)  BMI 23.70 kg/m2 , BMI Body mass index is 23.7 kg/(m^2). GEN: Well nourished, well developed, in no acute distress HEENT: normal Neck: no  JVD, carotid bruits, or masses Cardiac: RRR; no aortic insufficiency murmur.  No rubs, or gallops,no edema.  Good peripheral pulses.  Respiratory:  clear to auscultation bilaterally, normal work of breathing GI: soft, nontender, nondistended, + BS MS: no deformity or atrophy Skin: warm and dry, no rash Neuro:  Strength and sensation are intact Psych: euthymic mood, full affect   EKG:  EKG is not ordered today.    Recent Labs: No results found for requested labs within last 365 days.    Lipid Panel    Component Value Date/Time   CHOL 148 04/23/2014 0932   TRIG 49.0 04/23/2014 0932   HDL 64.30 04/23/2014 0932   CHOLHDL 2 04/23/2014 0932   VLDL 9.8 04/23/2014 0932   LDLCALC 74 04/23/2014 0932      Wt Readings from Last 3 Encounters:  09/02/15 142 lb 6.4 oz (64.592 kg)  04/25/15 134 lb (60.782 kg)  01/07/15 132 lb 12.8 oz (60.238 kg)         ASSESSMENT AND PLAN:  1. Status post Bentall procedure on 03/31/13 with replacement of her ascending aorta, aortic valve replacement with a bioprosthetic pericardial tissue valve, single-vessel CABG and Maze procedure. 2. Hypercholesterolemia 3. Hypothyroidism 4. essential hypertension without heart failure 5. Coronary artery disease 6. history of paroxysmal atrial fibrillation, maintaining normal sinus rhythm, on Xarelto. 7.  Polymyalgia rheumatica, on low-dose prednisone 8. Idiopathic peripheral neuropathy 9. Past history of sarcoma of left thigh   Current medicines are reviewed at length with the patient today.  The patient does not have concerns regarding medicines.  The following changes have been made:  no change  Labs/ tests ordered today include:  No orders of the defined types were placed in this encounter.     Disposition: The patient will continue on her current medication.  She will discuss whether to continue with her calcium tablets with her rheumatologist.  From the cardiac standpoint she does not need them.  She will return in 4 months for office visit and EKG and she will follow-up with Dr. Stanford Breed after my retirement.  Berna Spare MD 09/02/2015 9:00 AM    [facility] [address], [place], [place]  [address] Phone: [phone]; Fax: [phone]

## 2015-09-02 NOTE — Patient Instructions (Signed)
Medication Instructions:  Your physician recommends that you continue on your current medications as directed. Please refer to the Current Medication list given to you today.  Labwork: none  Testing/Procedures: none  Follow-Up: Your physician wants you to follow-up in: 4 month ov with Dr Trellis Moment will receive a reminder letter in the mail two months in advance. If you don't receive a letter, please call our office to schedule the follow-up appointment.

## 2015-10-04 DIAGNOSIS — M19042 Primary osteoarthritis, left hand: Secondary | ICD-10-CM | POA: Diagnosis not present

## 2015-10-04 DIAGNOSIS — M818 Other osteoporosis without current pathological fracture: Secondary | ICD-10-CM | POA: Diagnosis not present

## 2015-10-04 DIAGNOSIS — M353 Polymyalgia rheumatica: Secondary | ICD-10-CM | POA: Diagnosis not present

## 2015-10-04 DIAGNOSIS — M19041 Primary osteoarthritis, right hand: Secondary | ICD-10-CM | POA: Diagnosis not present

## 2015-10-14 DIAGNOSIS — N3000 Acute cystitis without hematuria: Secondary | ICD-10-CM | POA: Diagnosis not present

## 2015-10-14 DIAGNOSIS — R350 Frequency of micturition: Secondary | ICD-10-CM | POA: Diagnosis not present

## 2015-10-18 DIAGNOSIS — L570 Actinic keratosis: Secondary | ICD-10-CM | POA: Diagnosis not present

## 2015-10-18 DIAGNOSIS — L820 Inflamed seborrheic keratosis: Secondary | ICD-10-CM | POA: Diagnosis not present

## 2015-12-13 DIAGNOSIS — Z01419 Encounter for gynecological examination (general) (routine) without abnormal findings: Secondary | ICD-10-CM | POA: Diagnosis not present

## 2015-12-13 DIAGNOSIS — Z6823 Body mass index (BMI) 23.0-23.9, adult: Secondary | ICD-10-CM | POA: Diagnosis not present

## 2015-12-16 DIAGNOSIS — Z1231 Encounter for screening mammogram for malignant neoplasm of breast: Secondary | ICD-10-CM | POA: Diagnosis not present

## 2015-12-29 DIAGNOSIS — M353 Polymyalgia rheumatica: Secondary | ICD-10-CM | POA: Diagnosis not present

## 2015-12-29 DIAGNOSIS — M545 Low back pain: Secondary | ICD-10-CM | POA: Diagnosis not present

## 2015-12-29 DIAGNOSIS — M5416 Radiculopathy, lumbar region: Secondary | ICD-10-CM | POA: Diagnosis not present

## 2016-01-02 ENCOUNTER — Emergency Department (HOSPITAL_COMMUNITY): Payer: Medicare Other

## 2016-01-02 ENCOUNTER — Encounter (HOSPITAL_COMMUNITY): Payer: Self-pay | Admitting: *Deleted

## 2016-01-02 ENCOUNTER — Emergency Department (HOSPITAL_COMMUNITY)
Admission: EM | Admit: 2016-01-02 | Discharge: 2016-01-02 | Disposition: A | Discharge status: Home / Self Care | Payer: Medicare Other | Attending: Emergency Medicine | Admitting: Emergency Medicine

## 2016-01-02 DIAGNOSIS — M8448XA Pathological fracture, other site, initial encounter for fracture: Secondary | ICD-10-CM | POA: Diagnosis not present

## 2016-01-02 DIAGNOSIS — M545 Low back pain: Secondary | ICD-10-CM | POA: Diagnosis present

## 2016-01-02 DIAGNOSIS — Z8669 Personal history of other diseases of the nervous system and sense organs: Secondary | ICD-10-CM | POA: Insufficient documentation

## 2016-01-02 DIAGNOSIS — E039 Hypothyroidism, unspecified: Secondary | ICD-10-CM | POA: Diagnosis not present

## 2016-01-02 DIAGNOSIS — S32048A Other fracture of fourth lumbar vertebra, initial encounter for closed fracture: Secondary | ICD-10-CM | POA: Diagnosis not present

## 2016-01-02 DIAGNOSIS — Z79899 Other long term (current) drug therapy: Secondary | ICD-10-CM | POA: Insufficient documentation

## 2016-01-02 DIAGNOSIS — S32040A Wedge compression fracture of fourth lumbar vertebra, initial encounter for closed fracture: Secondary | ICD-10-CM

## 2016-01-02 DIAGNOSIS — M810 Age-related osteoporosis without current pathological fracture: Secondary | ICD-10-CM | POA: Diagnosis not present

## 2016-01-02 DIAGNOSIS — Z85830 Personal history of malignant neoplasm of bone: Secondary | ICD-10-CM | POA: Diagnosis not present

## 2016-01-02 DIAGNOSIS — I480 Paroxysmal atrial fibrillation: Secondary | ICD-10-CM | POA: Diagnosis not present

## 2016-01-02 DIAGNOSIS — F419 Anxiety disorder, unspecified: Secondary | ICD-10-CM | POA: Diagnosis not present

## 2016-01-02 DIAGNOSIS — I2510 Atherosclerotic heart disease of native coronary artery without angina pectoris: Secondary | ICD-10-CM | POA: Diagnosis not present

## 2016-01-02 DIAGNOSIS — Z7982 Long term (current) use of aspirin: Secondary | ICD-10-CM | POA: Diagnosis not present

## 2016-01-02 DIAGNOSIS — I5032 Chronic diastolic (congestive) heart failure: Secondary | ICD-10-CM | POA: Diagnosis not present

## 2016-01-02 DIAGNOSIS — Z85831 Personal history of malignant neoplasm of soft tissue: Secondary | ICD-10-CM | POA: Diagnosis not present

## 2016-01-02 DIAGNOSIS — Z9889 Other specified postprocedural states: Secondary | ICD-10-CM | POA: Insufficient documentation

## 2016-01-02 NOTE — ED Provider Notes (Addendum)
I saw and evaluated the patient, reviewed the resident's note and I agree with the findings and plan.  Pt has complaints of pain in her lower back.  Does radiate to her lower legs.  No incontinence.  Sensation intact.  Plan on CT scan imaging to evaluate further.    Dorie Rank, MD 01/02/16 1608  CT scan demonstrated a compression fracture.  Pt has history of osteoporosis.  TLSO brace fitted in the ED.  Follow up with neurosurgery as an outpatient.  Dorie Rank, MD 01/03/16 2009

## 2016-01-02 NOTE — Progress Notes (Signed)
Orthopedic Tech Progress Note Patient Details:  [name] [birth year] [id number] Called [employer] for brace order. Patient ID: Kayla Chambers, female   DOB: 1927-04-01, 82 y.o.   MRN: [id number]   Braulio Bosch 01/02/2016, 6:38 PM

## 2016-01-02 NOTE — ED Notes (Signed)
TLSO brace applied by manufacturer staff. Pt received education prior to d/c.

## 2016-01-02 NOTE — ED Provider Notes (Signed)
CSN: [id number]     Arrival date & time 01/02/16  1006 History   None    Chief Complaint  Patient presents with  . Back Pain   Patient is a 82 y.o. female presenting with back pain. The history is provided by the patient and the spouse.  Back Pain Location:  Lumbar spine Quality:  Shooting and stabbing Radiates to:  L posterior upper leg Pain severity:  Severe Pain is:  Unable to specify Onset quality:  Gradual Duration:  2 weeks Timing:  Constant Progression:  Worsening Chronicity:  New Context: not falling, not physical stress, not recent injury and not twisting   Relieved by:  Narcotics Worsened by:  Ambulation and bending Ineffective treatments:  Bed rest Associated symptoms: leg pain and weakness   Associated symptoms: no abdominal pain, no bladder incontinence, no bowel incontinence, no chest pain, no dysuria, no fever, no headaches, no numbness and no tingling   Risk factors: hx of cancer and steroid use     Past Medical History  Diagnosis Date  . Hypothyroidism   . [facility])     radiation & left lower extremity s/p resection in Feb 2012  . Dilated aortic root ([place])     a. 03/2013 s/p Bentall procedure with Ao Root replacement (59mm Valsalva graft) w/ reimplantationof the R and L coronary ostium.  Marland Kitchen Aortic insufficiency     a. 03/2013 s/p  AVR w/ biologic 59mm Magna Ease peric tissue valve, model 300TFX, ser# [id number].   Marland Kitchen CAD (coronary artery disease)     a. 01/2013 mod calcification w/o sev dzs;  b. 03/2013 CABG x1 VG->RCA @ time of AVR  . Paroxysmal a-fib ([place])     a. Dx 01/2013 - placed on xarelto;  b. 03/2013 s/p left sided MAZE and LA clip @ time of AVR/CABG.  . Peripheral neuropathy ([place])   . Intracranial aneurysm     in late 1970's  . Osteoporosis   . Rheumatic heart disease   . Headache(784.0)     hx migraines  . Anxiety   . Fibromyalgia   . 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 Neoadjuvant RT by Dr. Yisroel Ramming. Surveillance plan: MRI 12/08/11 shows changes in presumed post-op seroma/hematoma with slight increase in size - follow up MRI 11/18/12 showed same size. Will continue with q3 month MRI. CT C/A/P annually alternating with CXR at 6 months.   . Chronic diastolic CHF (congestive heart failure) ([place])     a. 01/2013 echo: EF 50-55%.  . Vertigo   . PMR (polymyalgia rheumatica) ([facility])     Inactive   Past Surgical History  Procedure Laterality Date  . Sarcoma resection  2011  . Abdominal hysterectomy    . Bladder suspension    . Colonoscopy  10/25/2011    Procedure: COLONOSCOPY;  Surgeon: Garlan Fair, MD;  [facility];  Service: Endoscopy;  Laterality: N/A;  . Eye surgery Bilateral     cataracts  . Ascending aortic root replacement N/A 03/31/2013    Procedure: ASCENDING AORTIC ROOT REPLACEMENT;  Surgeon: Grace Isaac, MD;  Location: [place];  Service: Open Heart Surgery;  Laterality: N/A;  . Intraoperative transesophageal echocardiogram N/A 03/31/2013    Procedure: INTRAOPERATIVE TRANSESOPHAGEAL ECHOCARDIOGRAM;  Surgeon: Grace Isaac, MD;  Location: [place];  Service: Open Heart Surgery;  Laterality: N/A;  . Maze N/A 03/31/2013    Procedure:  MAZE;  Surgeon: Grace Isaac, MD;  Location: [place];  Service: Open Heart Surgery;  Laterality: N/A;  . Left and right heart catheterization with coronary angiogram Right 02/02/2013    Procedure: LEFT AND RIGHT HEART CATHETERIZATION WITH CORONARY ANGIOGRAM;  Surgeon: Hillary Bow, MD;  Location: [facility] C[facility] LAB;  Service: Cardiovascular;  Laterality: Right;   Family History  Problem Relation Age of Onset  . Heart disease Mother   . Stroke Mother   . Arthritis Father   . Cancer Father   . Hypertension Sister   . Anesthesia problems Brother    Social History  Substance Use Topics  . Smoking status: Never Smoker   . Smokeless tobacco: Never Used  . Alcohol Use: No   OB  History    No data available     Review of Systems  Constitutional: Positive for activity change. Negative for fever and chills.  HENT: Negative for congestion and ear pain.   Eyes: Negative for visual disturbance.  Respiratory: Negative for cough, chest tightness and shortness of breath.   Cardiovascular: Negative for chest pain, palpitations and leg swelling.  Gastrointestinal: Negative for nausea, vomiting, abdominal pain, constipation, rectal pain and bowel incontinence.  Endocrine: Negative for polydipsia and polyuria.  Genitourinary: Negative for bladder incontinence, dysuria and difficulty urinating.  Musculoskeletal: Positive for back pain and gait problem. Negative for neck pain.  Neurological: Positive for weakness. Negative for tingling, syncope, numbness and headaches.  Psychiatric/Behavioral: Negative for behavioral problems, confusion and agitation.      Allergies  Alendronate sodium; Ambien; Lac bovis; Lyrica; Milk-related compounds; Neurontin; Procaine hcl; and Toprol xl  Home Medications   Prior to Admission medications   Medication Sig Start Date End Date Taking? Authorizing Provider  ALPRAZolam Duanne Moron) 0.5 MG tablet Take 0.5 mg by mouth at bedtime as needed for anxiety (for sleep).   Yes Historical Provider, MD  Ascorbic Acid (VITAMIN C PO) Take 1 tablet by mouth daily.   Yes Historical Provider, MD  aspirin 81 MG tablet Take 81 mg by mouth daily.   Yes Historical Provider, MD  b complex vitamins tablet Take 1 tablet by mouth daily.   Yes Historical Provider, MD  Calcium Carbonate-Vitamin D (CALCIUM + D PO) Take 1 tablet by mouth daily.    Yes Historical Provider, MD  CRANBERRY PO Take 1 tablet by mouth 2 (two) times daily.    Yes Historical Provider, MD  lactase (LACTAID) 3000 UNITS tablet Take 1 tablet by mouth daily as needed (only when eating dairy).   Yes Historical Provider, MD  levothyroxine (SYNTHROID, LEVOTHROID) 75 MCG tablet Take 75 mcg by mouth daily.      Yes Historical Provider, MD  Loperamide HCl (LOPERAMIDE A-D PO) Take 1 tablet by mouth 4 (four) times daily as needed (diarrhea).    Yes Historical Provider, MD  meclizine (ANTIVERT) 25 MG tablet Take 1 tablet (25 mg total) by mouth every 6 (six) hours as needed for dizziness. 01/07/15  Yes Darlin Coco, MD  metoprolol (LOPRESSOR) 50 MG tablet take 1/2 tablet by mouth twice a day Patient taking differently: Take 25 mg by mouth twice a day. 01/13/15  Yes Darlin Coco, MD  Multiple Vitamin (MULTIVITAMIN WITH MINERALS) TABS Take 1 tablet by mouth daily.   Yes Historical Provider, MD  predniSONE (DELTASONE) 20 MG tablet Take 30 mg by mouth twice a day for 3 days. Then take 20 mg by mouth twice a day. 12/29/15  Yes Historical Provider, MD  ropinirole (  REQUIP) 5 MG tablet Take 5 mg by mouth daily as needed (restless legs). RESTLESS LEG   Yes Historical Provider, MD   BP 138/81 mmHg  Pulse 55  Temp(Src) 97.8 F (36.6 C) (Oral)  Resp 24  SpO2 96% Physical Exam  Constitutional: She is oriented to person, place, and time. She appears well-developed and well-nourished. No distress.  HENT:  Head: Normocephalic and atraumatic.  Eyes: Pupils are equal, round, and reactive to light.  Neck: Normal range of motion. Neck supple.  Cardiovascular: Normal rate.   Pulmonary/Chest: Effort normal. No respiratory distress.  Abdominal: Bowel sounds are normal. She exhibits no distension. There is no tenderness.  Musculoskeletal:       Cervical back: She exhibits normal range of motion and no tenderness.       Thoracic back: She exhibits normal range of motion and no tenderness.       Lumbar back: She exhibits bony tenderness. She exhibits no deformity and no laceration.  Neurological: She is alert and oriented to person, place, and time. No cranial nerve deficit.  Skin: Skin is warm.  Psychiatric: She has a normal mood and affect. Her behavior is normal.  Nursing note and vitals reviewed.   ED Course   Procedures (including critical care time) Labs Review Labs Reviewed - No data to display  Imaging Review Ct Lumbar Spine Wo Contrast  01/02/2016  CLINICAL DATA:  History of sarcoma of the fever.  Back pain. EXAM: CT LUMBAR SPINE WITHOUT CONTRAST TECHNIQUE: Multidetector CT imaging of the lumbar spine was performed without intravenous contrast administration. Multiplanar CT image reconstructions were also generated. COMPARISON:  None. FINDINGS: There is at L4 vertebral body compression fracture with approximately 20% height loss and 5 mm of retropulsion of the inferior posterior margin of the L4 vertebral body. The remainder the vertebral body heights are maintained. There is 3 mm of retrolisthesis of L2 on L3. The paravertebral soft tissues are normal. The intraspinal soft tissues are not fully imaged on this examination due to poor soft tissue contrast, but there is no gross soft tissue abnormality. There is mild degenerative disc disease at L1-2. Small central disc protrusion at T12-L1. Mild broad-based disc bulge at L1-2. Moderate broad-based disc bulge at L3-4 with bilateral facet arthropathy. Mild broad-based disc bulge and mild bilateral facet arthropathy at L4-5. Severe left and moderate right facet arthropathy at L5-S1. There are degenerative changes of bilateral sacroiliac joints. IMPRESSION: 1. Acute L4 vertebral body compression fracture with approximately 20% height loss. 5 mm of retropulsion of the inferior posterior margin of the L4 vertebral body. Electronically Signed   By: Kathreen Devoid   On: 01/02/2016 16:30   I have personally reviewed and evaluated these images and lab results as part of my medical decision-making.   EKG Interpretation None      MDM   Final diagnoses:  Compression fracture of L4 lumbar vertebra, closed, initial encounter [facility])    Patient is an 82 year old female with history of left femur sarcoma resected in 2014 who presents today with 2 weeks of worsening  lower back pain radiating to her leg. She denies any falls, fever, bowel or bladder incontinence. She endorses subjective feeling of weakness in her lower extremities. She has been all weekend in bed. She took Vicodin this morning and at noon with improvement in her symptoms.  On physical exam vital signs are unremarkable. She appears well. She has no C or T-spine tenderness on exam. She has lumbar spinal tenderness. There  is no sensory or motor deficits on physical exam.  Differential diagnosis includes spinal metastases versus sciatica versus lumbosacral strain. No evidence of cauda equina syndrome on history or physical. Patient without burning on urination.  Patient with pain 5 out of 10. She declines pain medication at this time. CT lumbar spine ordered to evaluate bony processes.  Due to CT lumbar spine findings of acute L4 vertebral body compression fracture with possible retropulsion, I had a phone discussion with neurosurgeon on call Dr. Cyndy Freeze.  We discussed CT findings. He recommended TLSO brace, pain control, outpatient neurosurgery follow-up in 1 month.  Patient was fitted for a brace and was able to ambulate with little difficulty in the emergency department. Patient and husband report enough pain medicine at home. Patient to follow-up with either her orthopedic surgeon or Dr. Cyndy Freeze in 1 month for repeat films. Brace precautions given.  Pain control at time of discharge.  Discussed case with my attending Dr. Tomi Bamberger.  Vira Blanco, MD 01/03/16 [phone]

## 2016-01-02 NOTE — Progress Notes (Signed)
Orthopedic Tech Progress Note Patient Details:  Kayla Chambers 1927/06/18 [id number] Brace order completed by bio-tech vendor. Patient ID: Kayla Chambers, female   DOB: [birth year], 82 y.o.   MRN: [id number]   Braulio Bosch 01/02/2016, 7:47 PM

## 2016-01-02 NOTE — ED Notes (Signed)
Pt reports ongoing lower back pain. Pt states that she was seen by her orthopedist and was told to come here if pain continued. Pt reports receiving pain medication and prednisone.

## 2016-01-02 NOTE — ED Notes (Signed)
Ortho at bedside. Reports it will be around an hour and a half before brace arrives. Pt aware.

## 2016-01-02 NOTE — ED Notes (Signed)
Ortho Paged.  

## 2016-01-02 NOTE — Discharge Instructions (Signed)
Thoracolumbosacral Orthosis Brace A thoracolumbosacral orthosis (TLSO) brace can be worn for different purposes. A TLSO brace can be worn to support the back. Your back may need more support if you had a broken bone in your back (fractured vertebrae), back surgery, or you cannot move (paralysis) your torso muscles. A TLSO brace can also be worn by a child to help prevent curving back problems from getting worse as the child grows. TLSO braces are used to limit bending and twisting. The braces are made from a hard plastic. They are custom fit for each wearer. The TLSO brace covers both the front and back of the torso. On the back, the brace reaches from just above the tailbone to just below the shoulder blades. On the front, the brace covers the ribs and often reaches past the waist and over the hips. The brace can be worn under clothing.  HOME CARE INSTRUCTIONS  Ask your caregiver if you can remove your brace when showering or sleeping.  Follow your caregiver's instructions for putting on your brace.  Follow your caregiver's instructions about how much weight you can lift.  Always wear a T-shirt under the brace.  Make sure the brace is always well-aligned and fits you closely.  Check your skin daily for sores and redness caused by rubbing or pressure.  If you feel unsteady, use a cane or walker for support until you feel more steady.  Sit in high, firm chairs. It may be difficult to stand up from low, soft chairs.  Keep all follow-up appointments as directed by your caregiver. SEEK IMMEDIATE MEDICAL CARE IF:  You have a fever.  You have shortness of breath.  You have chest pain.  You have numbness, tingling, or pain. Developed in conjunction with the [employer] at [facility] of [facility]   This information is not intended to replace advice given to you by your health care provider. Make sure you discuss any questions you have with your health care  provider.   Document Released: 10/25/2011 Document Revised: 01/28/2012 Document Reviewed: 10/25/2011 Elsevier Interactive Patient Education 2016 Elsevier Inc.  Lumbar Fracture A lumbar fracture is a break in one of the bones of the lower back. Lumbar fractures range in severity. Severe fractures can damage the spinal cord. CAUSES This condition may be caused by:  A fall (common).  A car accident (common).  A gunshot wound.  A hard, direct hit to the back.  Osteoporosis. SYMPTOMS The main symptom of this condition is severe pain in the lower back. If a fracture is complex or severe, there may also be:  A misshapen or swollen area on the lower back.  A limited ability to move an area of the lower back.  An inability to empty the bladder or bowel.  A loss of strength or sensation in the legs, feet, and toes.  Paralysis. DIAGNOSIS This condition is diagnosed based on:  A physical exam.  Symptoms and what happened just before they developed.  The results of imaging tests, such as an X-ray, CT scan, or MRI. If your nerves have been damaged, you may also have other tests to find out how much damage there is. TREATMENT Treatment for this condition depends on the specifics of the injury. Most fractures can be treated with:  A back brace.  Bed rest and activity restrictions.  Pain medicine.  Physical therapy. Fractures that are complex, involve multiple bones, or make the spine unstable may require surgery to remove pressure from  the nerves or spinal cord and to stabilize the broken pieces of bone. During recovery, it is normal to have pain and stiffness in the back for weeks. HOME CARE INSTRUCTIONS Medicines  Take medicines only as directed by your health care provider.  Do not drive or operate heavy machinery while taking pain medicine. Activity  Stay in bed for as long as directed by your health care provider.  If you were shown how to do any exercises to  improve motion and strength in your back, do them as directed by your health care provider.  Return to your normal activities as directed by your health care provider. Ask your health care provider what activities are safe for you. General Instructions  If you were given a neck brace or back brace, wear it as directed by your health care provider.  Keep all follow-up visits as directed by your health care provider. This is important. Failure to follow-up as recommended could result in permanent injury, disability, and long-lasting (chronic) pain. SEEK MEDICAL CARE IF:  Your pain does not improve over time.  You have a persistent cough.  You cannot return to your normal activities as planned or expected. SEEK IMMEDIATE MEDICAL CARE IF:  You have severe pain or your pain suddenly gets worse.  You are unable to move.  You have numbness, tingling, weakness, or paralysis in any part of your body.  You cannot control your bladder or bowel.  You have difficulty breathing.  You have a fever.  You have pain in your chest or abdomen.  You vomit.   This information is not intended to replace advice given to you by your health care provider. Make sure you discuss any questions you have with your health care provider.   Document Released: 02/20/2007 Document Revised: 03/22/2015 Document Reviewed: 11/01/2014 Elsevier Interactive Patient Education [employer].

## 2016-01-02 NOTE — ED Notes (Signed)
Patient transported to X-ray 

## 2016-01-10 DIAGNOSIS — I10 Essential (primary) hypertension: Secondary | ICD-10-CM | POA: Diagnosis not present

## 2016-01-10 DIAGNOSIS — Z7901 Long term (current) use of anticoagulants: Secondary | ICD-10-CM | POA: Diagnosis not present

## 2016-01-10 DIAGNOSIS — R2242 Localized swelling, mass and lump, left lower limb: Secondary | ICD-10-CM | POA: Diagnosis not present

## 2016-01-10 DIAGNOSIS — E039 Hypothyroidism, unspecified: Secondary | ICD-10-CM | POA: Diagnosis not present

## 2016-01-10 DIAGNOSIS — T798XXS Other early complications of trauma, sequela: Secondary | ICD-10-CM | POA: Diagnosis not present

## 2016-01-10 DIAGNOSIS — Z79891 Long term (current) use of opiate analgesic: Secondary | ICD-10-CM | POA: Diagnosis not present

## 2016-01-10 DIAGNOSIS — C499 Malignant neoplasm of connective and soft tissue, unspecified: Secondary | ICD-10-CM | POA: Diagnosis not present

## 2016-01-10 DIAGNOSIS — Z483 Aftercare following surgery for neoplasm: Secondary | ICD-10-CM | POA: Diagnosis not present

## 2016-01-10 DIAGNOSIS — C4359 Malignant melanoma of other part of trunk: Secondary | ICD-10-CM | POA: Diagnosis not present

## 2016-01-10 DIAGNOSIS — I4891 Unspecified atrial fibrillation: Secondary | ICD-10-CM | POA: Diagnosis not present

## 2016-01-10 DIAGNOSIS — Z79899 Other long term (current) drug therapy: Secondary | ICD-10-CM | POA: Diagnosis not present

## 2016-01-10 DIAGNOSIS — Z7982 Long term (current) use of aspirin: Secondary | ICD-10-CM | POA: Diagnosis not present

## 2016-01-10 DIAGNOSIS — Z888 Allergy status to other drugs, medicaments and biological substances status: Secondary | ICD-10-CM | POA: Diagnosis not present

## 2016-01-19 ENCOUNTER — Other Ambulatory Visit: Payer: Self-pay | Admitting: Cardiology

## 2016-01-23 DIAGNOSIS — M810 Age-related osteoporosis without current pathological fracture: Secondary | ICD-10-CM | POA: Diagnosis not present

## 2016-01-23 DIAGNOSIS — Z0000 Encounter for general adult medical examination without abnormal findings: Secondary | ICD-10-CM | POA: Diagnosis not present

## 2016-01-23 DIAGNOSIS — S32000A Wedge compression fracture of unspecified lumbar vertebra, initial encounter for closed fracture: Secondary | ICD-10-CM | POA: Diagnosis not present

## 2016-01-23 DIAGNOSIS — E039 Hypothyroidism, unspecified: Secondary | ICD-10-CM | POA: Diagnosis not present

## 2016-01-23 DIAGNOSIS — E7800 Pure hypercholesterolemia, unspecified: Secondary | ICD-10-CM | POA: Diagnosis not present

## 2016-01-23 DIAGNOSIS — Z1389 Encounter for screening for other disorder: Secondary | ICD-10-CM | POA: Diagnosis not present

## 2016-01-23 DIAGNOSIS — I10 Essential (primary) hypertension: Secondary | ICD-10-CM | POA: Diagnosis not present

## 2016-01-23 DIAGNOSIS — I2510 Atherosclerotic heart disease of native coronary artery without angina pectoris: Secondary | ICD-10-CM | POA: Diagnosis not present

## 2016-01-23 DIAGNOSIS — T148 Other injury of unspecified body region: Secondary | ICD-10-CM | POA: Diagnosis not present

## 2016-01-23 DIAGNOSIS — C4920 Malignant neoplasm of connective and soft tissue of unspecified lower limb, including hip: Secondary | ICD-10-CM | POA: Diagnosis not present

## 2016-01-23 DIAGNOSIS — M353 Polymyalgia rheumatica: Secondary | ICD-10-CM | POA: Diagnosis not present

## 2016-01-24 DIAGNOSIS — M810 Age-related osteoporosis without current pathological fracture: Secondary | ICD-10-CM | POA: Diagnosis not present

## 2016-01-24 DIAGNOSIS — M545 Low back pain: Secondary | ICD-10-CM | POA: Diagnosis not present

## 2016-01-24 DIAGNOSIS — M353 Polymyalgia rheumatica: Secondary | ICD-10-CM | POA: Diagnosis not present

## 2016-01-25 NOTE — Progress Notes (Signed)
HPI: Follow-up coronary artery disease status post coronary artery bypass graft, Bentall/AVR and maze procedure. Previously followed by Dr Mare Ferrari.  She underwent a Bentall procedure by Dr. Servando Snare on 03/31/13. 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 Preoperatively she had been in and out of atrial fibrillation. More recently she has been maintaining normal sinus rhythm. She is no longer on amiodarone and stopped her xarelto due to bruising. Carotid Dopplers May 2014 showed 40-59% right and no significant stenosis on the left. Last echocardiogram June 2015 showed normal LV systolic function, biatrial enlargement, mean gradient across aortic valve of 7 mmHg. Since last seen, Patient denies dyspnea, chest pain, palpitations or syncope.  Current Outpatient Prescriptions  Medication Sig Dispense Refill  . ALPRAZolam (XANAX) 0.5 MG tablet Take 0.5 mg by mouth at bedtime as needed for anxiety (for sleep).    . Ascorbic Acid (VITAMIN C PO) Take 1 tablet by mouth daily.    Marland Kitchen aspirin 81 MG tablet Take 81 mg by mouth daily.    Marland Kitchen b complex vitamins tablet Take 1 tablet by mouth daily.    . Calcium Carbonate-Vitamin D (CALCIUM + D PO) Take 1 tablet by mouth daily.     Marland Kitchen CRANBERRY PO Take 1 tablet by mouth 2 (two) times daily.     Marland Kitchen lactase (LACTAID) 3000 UNITS tablet Take 1 tablet by mouth daily as needed (only when eating dairy).    Marland Kitchen levothyroxine (SYNTHROID, LEVOTHROID) 75 MCG tablet Take 75 mcg by mouth daily.      . Loperamide HCl (LOPERAMIDE A-D PO) Take 1 tablet by mouth 4 (four) times daily as needed (diarrhea).     . metoprolol (LOPRESSOR) 50 MG tablet take 1/2 tablet by mouth twice a day 90 tablet 0  . Multiple Vitamin (MULTIVITAMIN WITH MINERALS) TABS Take 1 tablet by mouth daily.    . predniSONE (DELTASONE) 5 MG tablet Take 5 mg by mouth daily with  breakfast.     No current facility-administered medications for this visit.     Past Medical History  Diagnosis Date  . Hypothyroidism   . [facility])     radiation & left lower extremity s/p resection in Feb 2012  . Dilated aortic root ([place])     a. 03/2013 s/p Bentall procedure with Ao Root replacement (62mm Valsalva graft) w/ reimplantationof the R and L coronary ostium.  Marland Kitchen Aortic insufficiency     a. 03/2013 s/p  AVR w/ biologic 54mm Magna Ease peric tissue valve, model 300TFX, ser# [id number].   Marland Kitchen CAD (coronary artery disease)     a. 01/2013 mod calcification w/o sev dzs;  b. 03/2013 CABG x1 VG->RCA @ time of AVR  . Paroxysmal a-fib ([place])     a. Dx 01/2013 - placed on xarelto;  b. 03/2013 s/p left sided MAZE and LA clip @ time of AVR/CABG.  . Peripheral neuropathy ([place])   . Intracranial aneurysm     in late 1970's  . Osteoporosis   . Rheumatic heart disease   . Headache(784.0)     hx migraines  . Anxiety   . Fibromyalgia   . 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 Neoadjuvant RT by Dr. Yisroel Ramming. Surveillance plan: MRI 12/08/11 shows changes in presumed post-op seroma/hematoma with slight increase in  size - follow up MRI 11/18/12 showed same size. Will continue with q3 month MRI. CT C/A/P annually alternating with CXR at 6 months.   . Chronic diastolic CHF (congestive heart failure) ([place])     a. 01/2013 echo: EF 50-55%.  . Vertigo   . PMR (polymyalgia rheumatica) ([facility])     Inactive    Past Surgical History  Procedure Laterality Date  . Sarcoma resection  2011  . Abdominal hysterectomy    . Bladder suspension    . Colonoscopy  10/25/2011    Procedure: COLONOSCOPY;  Surgeon: Garlan Fair, MD;  [facility];  Service: Endoscopy;  Laterality: N/A;  . Eye surgery Bilateral     cataracts  . Ascending aortic root replacement N/A 03/31/2013    Procedure: ASCENDING AORTIC ROOT  REPLACEMENT;  Surgeon: Grace Isaac, MD;  Location: [place];  Service: Open Heart Surgery;  Laterality: N/A;  . Intraoperative transesophageal echocardiogram N/A 03/31/2013    Procedure: INTRAOPERATIVE TRANSESOPHAGEAL ECHOCARDIOGRAM;  Surgeon: Grace Isaac, MD;  Location: [place];  Service: Open Heart Surgery;  Laterality: N/A;  . Maze N/A 03/31/2013    Procedure: MAZE;  Surgeon: Grace Isaac, MD;  Location: [place];  Service: Open Heart Surgery;  Laterality: N/A;  . Left and right heart catheterization with coronary angiogram Right 02/02/2013    Procedure: LEFT AND RIGHT HEART CATHETERIZATION WITH CORONARY ANGIOGRAM;  Surgeon: Hillary Bow, MD;  Location: [facility] CATH LAB;  Service: Cardiovascular;  Laterality: Right;    Social History   Social History  . Marital Status: Married    Spouse Name: N/A  . Number of Children: N/A  . Years of Education: N/A   Occupational History  . Not on file.   Social History Main Topics  . Smoking status: Never Smoker   . Smokeless tobacco: Never Used  . Alcohol Use: No  . Drug Use: No  . Sexual Activity: Yes   Other Topics Concern  . Not on file   Social History Narrative    Family History  Problem Relation Age of Onset  . Heart disease Mother   . Stroke Mother   . Arthritis Father   . Cancer Father   . Hypertension Sister   . Anesthesia problems Brother     ROS: no fevers or chills, productive cough, hemoptysis, dysphasia, odynophagia, melena, hematochezia, dysuria, hematuria, rash, seizure activity, orthopnea, PND, pedal edema, claudication. Remaining systems are negative.  Physical Exam: Well-developed well-nourished in no acute distress.  Skin is warm and dry.  HEENT is normal.  Neck is supple.  Chest is clear to auscultation with normal expansion.  Cardiovascular exam is regular rate and rhythm. 2/6 systolic murmur left sternal border. No diastolic murmur Abdominal exam nontender or distended. No masses  palpated. Extremities show no edema. neuro grossly intact  ECG Sinus rhythm at a rate of 64. Normal axis. Septal infarct, nonspecific ST changes.

## 2016-02-02 ENCOUNTER — Ambulatory Visit (HOSPITAL_COMMUNITY)
Admission: RE | Admit: 2016-02-02 | Discharge: 2016-02-02 | Disposition: A | Discharge status: Home / Self Care | Payer: Medicare Other | Source: Ambulatory Visit | Attending: Cardiology | Admitting: Cardiology

## 2016-02-02 ENCOUNTER — Ambulatory Visit (INDEPENDENT_AMBULATORY_CARE_PROVIDER_SITE_OTHER): Payer: Medicare Other | Admitting: Cardiology

## 2016-02-02 ENCOUNTER — Encounter: Payer: Self-pay | Admitting: Cardiology

## 2016-02-02 VITALS — BP 124/68 | HR 64 | Ht 65.0 in | Wt 132.0 lb

## 2016-02-02 DIAGNOSIS — G629 Polyneuropathy, unspecified: Secondary | ICD-10-CM | POA: Insufficient documentation

## 2016-02-02 DIAGNOSIS — R0989 Other specified symptoms and signs involving the circulatory and respiratory systems: Secondary | ICD-10-CM | POA: Diagnosis not present

## 2016-02-02 DIAGNOSIS — Z954 Presence of other heart-valve replacement: Secondary | ICD-10-CM | POA: Diagnosis not present

## 2016-02-02 DIAGNOSIS — I4891 Unspecified atrial fibrillation: Secondary | ICD-10-CM

## 2016-02-02 DIAGNOSIS — Z952 Presence of prosthetic heart valve: Secondary | ICD-10-CM

## 2016-02-02 DIAGNOSIS — I509 Heart failure, unspecified: Secondary | ICD-10-CM | POA: Diagnosis not present

## 2016-02-02 DIAGNOSIS — E785 Hyperlipidemia, unspecified: Secondary | ICD-10-CM | POA: Diagnosis not present

## 2016-02-02 DIAGNOSIS — I6523 Occlusion and stenosis of bilateral carotid arteries: Secondary | ICD-10-CM | POA: Insufficient documentation

## 2016-02-02 DIAGNOSIS — I2510 Atherosclerotic heart disease of native coronary artery without angina pectoris: Secondary | ICD-10-CM | POA: Insufficient documentation

## 2016-02-02 DIAGNOSIS — I679 Cerebrovascular disease, unspecified: Secondary | ICD-10-CM | POA: Insufficient documentation

## 2016-02-02 MED ORDER — PRAVASTATIN SODIUM 40 MG PO TABS: 40.0000 mg | ORAL_TABLET | Freq: Every evening | ORAL | Status: DC

## 2016-02-02 NOTE — Assessment & Plan Note (Addendum)
Patient is status post maze procedure. She remains in sinus rhythm. Anticoagulation was discontinued previously because of significant bruising. Continue beta blocker and aspirin. Note approximately 20 minutes spent reviewing records prior to patient arrival today.

## 2016-02-02 NOTE — Assessment & Plan Note (Addendum)
Patient with history of aortic valve replacement And aortic root replacement. Continue SBE prophylaxis. SBE prophylaxis Card provided today.

## 2016-02-02 NOTE — Assessment & Plan Note (Signed)
Blood pressure controlled. Continue present medications. 

## 2016-02-02 NOTE — Assessment & Plan Note (Signed)
Schedule follow-up carotid Dopplers. 

## 2016-02-02 NOTE — Assessment & Plan Note (Signed)
Continue aspirin. Add Pravachol 40 mg daily. Check lipids and liver in 4 weeks.

## 2016-02-02 NOTE — Patient Instructions (Signed)
Medication Instructions:   START PRAVASTATIN 40 MG ONCE DAILY  Labwork:  Your physician recommends that you return for lab work in: 4 WEEKS= DO NOT EAT PRIOR TO LAB WORK  Testing/Procedures:  Your physician has requested that you have a carotid duplex. This test is an ultrasound of the carotid arteries in your neck. It looks at blood flow through these arteries that supply the brain with blood. Allow one hour for this exam. There are no restrictions or special instructions.    Follow-Up:  Your physician wants you to follow-up in: [place] will receive a reminder letter in the mail two months in advance. If you don't receive a letter, please call our office to schedule the follow-up appointment.

## 2016-02-02 NOTE — Assessment & Plan Note (Signed)
Add Pravachol 40 mg daily. Check lipids and liver in 4 weeks. 

## 2016-02-09 DIAGNOSIS — S32000A Wedge compression fracture of unspecified lumbar vertebra, initial encounter for closed fracture: Secondary | ICD-10-CM | POA: Diagnosis not present

## 2016-02-27 DIAGNOSIS — M545 Low back pain: Secondary | ICD-10-CM | POA: Diagnosis not present

## 2016-02-27 DIAGNOSIS — M353 Polymyalgia rheumatica: Secondary | ICD-10-CM | POA: Diagnosis not present

## 2016-02-27 DIAGNOSIS — M810 Age-related osteoporosis without current pathological fracture: Secondary | ICD-10-CM | POA: Diagnosis not present

## 2016-03-14 DIAGNOSIS — M810 Age-related osteoporosis without current pathological fracture: Secondary | ICD-10-CM | POA: Diagnosis not present

## 2016-03-14 DIAGNOSIS — M25511 Pain in right shoulder: Secondary | ICD-10-CM | POA: Diagnosis not present

## 2016-03-14 DIAGNOSIS — M353 Polymyalgia rheumatica: Secondary | ICD-10-CM | POA: Diagnosis not present

## 2016-03-14 DIAGNOSIS — M25512 Pain in left shoulder: Secondary | ICD-10-CM | POA: Diagnosis not present

## 2016-03-14 DIAGNOSIS — M545 Low back pain: Secondary | ICD-10-CM | POA: Diagnosis not present

## 2016-03-20 ENCOUNTER — Ambulatory Visit (HOSPITAL_COMMUNITY)
Admission: RE | Admit: 2016-03-20 | Discharge: 2016-03-20 | Disposition: A | Discharge status: Home / Self Care | Payer: Medicare Other | Source: Ambulatory Visit | Attending: Rheumatology | Admitting: Rheumatology

## 2016-03-20 DIAGNOSIS — M810 Age-related osteoporosis without current pathological fracture: Secondary | ICD-10-CM | POA: Insufficient documentation

## 2016-03-20 MED ORDER — ZOLEDRONIC ACID 5 MG/100ML IV SOLN
5.0000 mg | Freq: Once | INTRAVENOUS | Status: AC
Start: 2016-03-20 — End: 2016-03-20
  Administered 2016-03-20: 5 mg via INTRAVENOUS
  Filled 2016-03-20: qty 100

## 2016-03-20 MED ORDER — SODIUM CHLORIDE 0.9 % IV SOLN
Freq: Once | INTRAVENOUS | Status: AC
  Administered 2016-03-20: 1 mL via INTRAVENOUS

## 2016-03-20 NOTE — Discharge Instructions (Signed)

## 2016-03-31 DIAGNOSIS — H1032 Unspecified acute conjunctivitis, left eye: Secondary | ICD-10-CM | POA: Diagnosis not present

## 2016-04-05 DIAGNOSIS — Z961 Presence of intraocular lens: Secondary | ICD-10-CM | POA: Diagnosis not present

## 2016-04-05 DIAGNOSIS — B309 Viral conjunctivitis, unspecified: Secondary | ICD-10-CM | POA: Diagnosis not present

## 2016-04-19 ENCOUNTER — Other Ambulatory Visit: Payer: Self-pay | Admitting: *Deleted

## 2016-04-19 MED ORDER — METOPROLOL TARTRATE 50 MG PO TABS: 25.0000 mg | ORAL_TABLET | Freq: Two times a day (BID) | ORAL | Status: DC

## 2016-04-26 ENCOUNTER — Encounter: Payer: Self-pay | Admitting: *Deleted

## 2016-05-04 DIAGNOSIS — E785 Hyperlipidemia, unspecified: Secondary | ICD-10-CM | POA: Diagnosis not present

## 2016-05-05 LAB — HEPATIC FUNCTION PANEL
ALBUMIN: 3.9 g/dL (ref 3.6–5.1)
ALT: 11 U/L (ref 6–29)
AST: 18 U/L (ref 10–35)
Alkaline Phosphatase: 50 U/L (ref 33–130)
BILIRUBIN TOTAL: 0.5 mg/dL (ref 0.2–1.2)
Bilirubin, Direct: 0.1 mg/dL (ref <=0.2)
Indirect Bilirubin: 0.4 mg/dL (ref 0.2–1.2)
Total Protein: 6.2 g/dL (ref 6.1–8.1)

## 2016-05-05 LAB — LIPID PANEL
CHOL/HDL RATIO: 2.3 ratio (ref <=5.0)
Cholesterol: 148 mg/dL (ref 125–200)
HDL: 65 mg/dL (ref >=46)
LDL CALC: 68 mg/dL (ref <130)
Triglycerides: 76 mg/dL (ref <150)
VLDL: 15 mg/dL (ref <30)

## 2016-05-07 ENCOUNTER — Encounter: Payer: Self-pay | Admitting: *Deleted

## 2016-05-08 DIAGNOSIS — M810 Age-related osteoporosis without current pathological fracture: Secondary | ICD-10-CM | POA: Diagnosis not present

## 2016-05-08 DIAGNOSIS — M353 Polymyalgia rheumatica: Secondary | ICD-10-CM | POA: Diagnosis not present

## 2016-05-08 DIAGNOSIS — M545 Low back pain: Secondary | ICD-10-CM | POA: Diagnosis not present

## 2016-07-03 DIAGNOSIS — I10 Essential (primary) hypertension: Secondary | ICD-10-CM | POA: Diagnosis not present

## 2016-07-03 DIAGNOSIS — E039 Hypothyroidism, unspecified: Secondary | ICD-10-CM | POA: Diagnosis not present

## 2016-07-03 DIAGNOSIS — Z884 Allergy status to anesthetic agent status: Secondary | ICD-10-CM | POA: Diagnosis not present

## 2016-07-03 DIAGNOSIS — Z7982 Long term (current) use of aspirin: Secondary | ICD-10-CM | POA: Diagnosis not present

## 2016-07-03 DIAGNOSIS — Z483 Aftercare following surgery for neoplasm: Secondary | ICD-10-CM | POA: Diagnosis not present

## 2016-07-03 DIAGNOSIS — Z9889 Other specified postprocedural states: Secondary | ICD-10-CM | POA: Diagnosis not present

## 2016-07-03 DIAGNOSIS — Z9181 History of falling: Secondary | ICD-10-CM | POA: Diagnosis not present

## 2016-07-03 DIAGNOSIS — C4922 Malignant neoplasm of connective and soft tissue of left lower limb, including hip: Secondary | ICD-10-CM | POA: Diagnosis not present

## 2016-07-03 DIAGNOSIS — C499 Malignant neoplasm of connective and soft tissue, unspecified: Secondary | ICD-10-CM | POA: Diagnosis not present

## 2016-07-03 DIAGNOSIS — Z888 Allergy status to other drugs, medicaments and biological substances status: Secondary | ICD-10-CM | POA: Diagnosis not present

## 2016-07-03 DIAGNOSIS — I4891 Unspecified atrial fibrillation: Secondary | ICD-10-CM | POA: Diagnosis not present

## 2016-07-03 DIAGNOSIS — Z79899 Other long term (current) drug therapy: Secondary | ICD-10-CM | POA: Diagnosis not present

## 2016-07-03 DIAGNOSIS — Z8679 Personal history of other diseases of the circulatory system: Secondary | ICD-10-CM | POA: Diagnosis not present

## 2016-07-10 DIAGNOSIS — M810 Age-related osteoporosis without current pathological fracture: Secondary | ICD-10-CM | POA: Diagnosis not present

## 2016-07-10 DIAGNOSIS — Z79899 Other long term (current) drug therapy: Secondary | ICD-10-CM | POA: Diagnosis not present

## 2016-07-10 DIAGNOSIS — M353 Polymyalgia rheumatica: Secondary | ICD-10-CM | POA: Diagnosis not present

## 2016-08-02 NOTE — Progress Notes (Signed)
HPI: Follow-up CAD status post coronary artery bypass graft, Bentall/AVR and maze procedure.  She underwent a Bentall procedure by Dr. Servando Snare on 03/31/13. 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 Preoperatively she had been in and out of atrial fibrillation. More recently she has been maintaining normal sinus rhythm. She is no longer on amiodarone and stopped her xarelto due to bruising. Last echocardiogram June 2015 showed normal LV systolic function, biatrial enlargement, mean gradient across aortic valve of 7 mmHg. Carotid Dopplers March 2017 showed 1-39% bilateral stenosis. Since last seen, she has some fatigue. Some dyspnea on exertion but no orthopnea, PND, chest pain or syncope. Minimal pedal edema.  Current Outpatient Prescriptions  Medication Sig Dispense Refill  . ALPRAZolam (XANAX) 0.5 MG tablet Take 0.5 mg by mouth at bedtime as needed for anxiety (for sleep).    . Ascorbic Acid (VITAMIN C PO) Take 1 tablet by mouth daily.    Marland Kitchen aspirin 81 MG tablet Take 81 mg by mouth daily.    Marland Kitchen b complex vitamins tablet Take 1 tablet by mouth daily.    . Calcium Carbonate-Vitamin D (CALCIUM + D PO) Take 1 tablet by mouth daily.     Marland Kitchen CRANBERRY PO Take 1 tablet by mouth 2 (two) times daily.     Marland Kitchen lactase (LACTAID) 3000 UNITS tablet Take 1 tablet by mouth daily as needed (only when eating dairy).    Marland Kitchen levothyroxine (SYNTHROID, LEVOTHROID) 75 MCG tablet Take 75 mcg by mouth daily.      . Loperamide HCl (LOPERAMIDE A-D PO) Take 1 tablet by mouth 4 (four) times daily as needed (diarrhea).     . metoprolol (LOPRESSOR) 50 MG tablet Take 0.5 tablets (25 mg total) by mouth 2 (two) times daily. 90 tablet 3  . Multiple Vitamin (MULTIVITAMIN WITH MINERALS) TABS Take 1 tablet by mouth daily.    . pravastatin (PRAVACHOL) 40 MG tablet Take 1 tablet (40 mg total) by mouth every  evening. 90 tablet 3  . ROPINIROLE HCL PO Take 1 tablet by mouth daily as needed.     No current facility-administered medications for this visit.      Past Medical History:  Diagnosis Date  . Anxiety   . Aortic insufficiency    a. 03/2013 s/p  AVR w/ biologic 60mm Magna Ease peric tissue valve, model 300TFX, ser# [id number].   Marland Kitchen CAD (coronary artery disease)    a. 01/2013 mod calcification w/o sev dzs;  b. 03/2013 CABG x1 VG->RCA @ time of AVR  . Chronic diastolic CHF (congestive heart failure) ([place])    a. 01/2013 echo: EF 50-55%.  . Dilated aortic root ([place])    a. 03/2013 s/p Bentall procedure with Ao Root replacement (41mm Valsalva graft) w/ reimplantationof the R and L coronary ostium.  . Fibromyalgia   . Headache(784.0)    hx migraines  . Hypothyroidism   . Intracranial aneurysm    in late 1970's  . 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 Neoadjuvant RT by Dr. Yisroel Ramming. Surveillance plan: MRI 12/08/11 shows changes in presumed post-op seroma/hematoma with slight increase in size - follow up MRI 11/18/12 showed same size. Will continue with q3 month MRI. CT C/A/P annually alternating with CXR at 6 months.   . Osteoporosis   . Paroxysmal  a-fib ([place])    a. Dx 01/2013 - placed on xarelto;  b. 03/2013 s/p left sided MAZE and LA clip @ time of AVR/CABG.  . Peripheral neuropathy ([place])   . PMR (polymyalgia rheumatica) ([facility])    Inactive  . Rheumatic heart disease   . 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 Vertigo     Past Surgical History:  Procedure Laterality Date  . ABDOMINAL HYSTERECTOMY    . ASCENDING AORTIC ROOT REPLACEMENT N/A 03/31/2013   Procedure: ASCENDING AORTIC ROOT REPLACEMENT;  Surgeon: Grace Isaac, MD;  Location: [place];  Service: Open Heart Surgery;  Laterality: N/A;  . BLADDER SUSPENSION    . COLONOSCOPY  10/25/2011   Procedure:  COLONOSCOPY;  Surgeon: Garlan Fair, MD;  Location: [facility];  Service: Endoscopy;  Laterality: N/A;  . EYE SURGERY Bilateral    cataracts  . INTRAOPERATIVE TRANSESOPHAGEAL ECHOCARDIOGRAM N/A 03/31/2013   Procedure: INTRAOPERATIVE TRANSESOPHAGEAL ECHOCARDIOGRAM;  Surgeon: Grace Isaac, MD;  Location: [place];  Service: Open Heart Surgery;  Laterality: N/A;  . LEFT AND RIGHT HEART CATHETERIZATION WITH CORONARY ANGIOGRAM Right 02/02/2013   Procedure: LEFT AND RIGHT HEART CATHETERIZATION WITH CORONARY ANGIOGRAM;  Surgeon: Hillary Bow, MD;  Location: [facility] C[facility];  Service: Cardiovascular;  Laterality: Right;  Marland Kitchen MAZE N/A 03/31/2013   Procedure: MAZE;  Surgeon: Grace Isaac, MD;  Location: [place];  Service: Open Heart Surgery;  Laterality: N/A;  . Sarcoma resection  2011    Social History   Social History  . Marital status: Married    Spouse name: N/A  . Number of children: N/A  . Years of education: N/A   Occupational History  . Not on file.   Social History Main Topics  . Smoking status: Never Smoker  . Smokeless tobacco: Never Used  . Alcohol use No  . Drug use: No  . Sexual activity: Yes   Other Topics Concern  . Not on file   Social History Narrative  . No narrative on file    Family History  Problem Relation Age of Onset  . Heart disease Mother   . Stroke Mother   . Arthritis Father   . Cancer Father   . Anesthesia problems Brother   . Hypertension Sister     ROS: no fevers or chills, productive cough, hemoptysis, dysphasia, odynophagia, melena, hematochezia, dysuria, hematuria, rash, seizure activity, orthopnea, PND, pedal edema, claudication. Remaining systems are negative.  Physical Exam: Well-developed well-nourished in no acute distress.  Skin is warm and dry.  HEENT is normal.  Neck is supple.  Chest is clear to auscultation with normal expansion.  Cardiovascular exam is regular rate and rhythm.  Abdominal exam nontender or distended. No  masses palpated. Extremities show trace 2/6 systolic murmur left sternal border. No diastolic murmur noted. edema. neuro grossly intact  ECG-Sinus bradycardia at a rate of 53. Cannot rule out prior septal infarct. Lateral T-wave inversion.  A/P  1 Paroxysmal atrial fibrillation-patient is status post maze procedure. She remains in sinus rhythm on examination. Anticoagulation was discontinued previously because of bruising by report. Continue beta blocker and aspirin.  2 coronary artery disease-continue aspirin and statin.  3 hypertension-blood pressure controlled. Continue present medications.  4 carotid artery stenosis-mild on most recent Dopplers.  5 status post aortic valve replacement-continue SBE prophylaxis.  6 hyperlipidemia-continue statin.  [name], MD

## 2016-08-03 ENCOUNTER — Encounter: Payer: Self-pay | Admitting: Cardiology

## 2016-08-06 ENCOUNTER — Ambulatory Visit (INDEPENDENT_AMBULATORY_CARE_PROVIDER_SITE_OTHER): Payer: Medicare Other | Admitting: Cardiology

## 2016-08-06 ENCOUNTER — Encounter: Payer: Self-pay | Admitting: Cardiology

## 2016-08-06 VITALS — BP 128/76 | HR 53 | Ht 65.0 in | Wt 134.0 lb

## 2016-08-06 DIAGNOSIS — I10 Essential (primary) hypertension: Secondary | ICD-10-CM | POA: Diagnosis not present

## 2016-08-06 DIAGNOSIS — I480 Paroxysmal atrial fibrillation: Secondary | ICD-10-CM | POA: Diagnosis not present

## 2016-08-06 DIAGNOSIS — I25810 Atherosclerosis of coronary artery bypass graft(s) without angina pectoris: Secondary | ICD-10-CM

## 2016-08-06 DIAGNOSIS — I2510 Atherosclerotic heart disease of native coronary artery without angina pectoris: Secondary | ICD-10-CM

## 2016-08-06 DIAGNOSIS — E785 Hyperlipidemia, unspecified: Secondary | ICD-10-CM

## 2016-08-06 NOTE — Patient Instructions (Signed)
Your physician wants you to follow-up in: 6 MONTHS WITH DR CRENSHAW You will receive a reminder letter in the mail two months in advance. If you don't receive a letter, please call our office to schedule the follow-up appointment.   If you need a refill on your cardiac medications before your next appointment, please call your pharmacy.  

## 2016-08-07 ENCOUNTER — Telehealth: Payer: Self-pay | Admitting: *Deleted

## 2016-08-07 DIAGNOSIS — R601 Generalized edema: Secondary | ICD-10-CM

## 2016-08-07 NOTE — Telephone Encounter (Signed)
-----   Message from Lelon Perla, MD sent at 08/07/2016  5:01 AM EDT ----- Regarding: RE: questions Lasix 20 mg po qod prn, bmet 2 weeks [name]  ----- Message ----- From: Cristopher Estimable, RN Sent: 08/06/2016   4:44 PM To: Lelon Perla, MD Subject: RE: questions                                  Do we need to do anything about her edema?? ----- Message ----- From: Corinna Lines Sent: 08/06/2016   2:02 PM To: Cristopher Estimable, RN Subject: questions                                        Kayla Chambers  The patients husband came back and asked if there was anything he could have for his wifes ankles, like Lasix.  I told him that you could give him a call.  [facility]

## 2016-08-07 NOTE — Telephone Encounter (Signed)
Left message for pt to call.

## 2016-08-10 MED ORDER — FUROSEMIDE 20 MG PO TABS: ORAL_TABLET | ORAL | 6 refills | Status: DC

## 2016-08-10 NOTE — Telephone Encounter (Signed)
Spoke with pt husband, Aware of dr Jacalyn Lefevre recommendations. New script sent to the pharmacy Lab orders mailed to the pt

## 2016-08-10 NOTE — Telephone Encounter (Signed)
F/u ° ° ° ° ° °Pt returning nurse call.  °

## 2016-09-19 DIAGNOSIS — R601 Generalized edema: Secondary | ICD-10-CM | POA: Diagnosis not present

## 2016-09-20 LAB — BASIC METABOLIC PANEL
BUN: 16 mg/dL (ref 7–25)
CHLORIDE: 102 mmol/L (ref 98–110)
CO2: 30 mmol/L (ref 20–31)
CREATININE: 0.7 mg/dL (ref 0.60–0.88)
Calcium: 9.5 mg/dL (ref 8.6–10.4)
GLUCOSE: 80 mg/dL (ref 65–99)
Potassium: 4.9 mmol/L (ref 3.5–5.3)
Sodium: 138 mmol/L (ref 135–146)

## 2016-12-25 DIAGNOSIS — Z1231 Encounter for screening mammogram for malignant neoplasm of breast: Secondary | ICD-10-CM | POA: Diagnosis not present

## 2016-12-25 DIAGNOSIS — Z01419 Encounter for gynecological examination (general) (routine) without abnormal findings: Secondary | ICD-10-CM | POA: Diagnosis not present

## 2016-12-25 DIAGNOSIS — Z6823 Body mass index (BMI) 23.0-23.9, adult: Secondary | ICD-10-CM | POA: Diagnosis not present

## 2016-12-28 ENCOUNTER — Encounter: Payer: Self-pay | Admitting: Physician Assistant

## 2016-12-28 ENCOUNTER — Ambulatory Visit (INDEPENDENT_AMBULATORY_CARE_PROVIDER_SITE_OTHER): Payer: Medicare Other | Admitting: Physician Assistant

## 2016-12-28 VITALS — BP 119/78 | HR 90 | Ht 65.0 in | Wt 138.0 lb

## 2016-12-28 DIAGNOSIS — Z79899 Other long term (current) drug therapy: Secondary | ICD-10-CM | POA: Diagnosis not present

## 2016-12-28 DIAGNOSIS — Z9889 Other specified postprocedural states: Secondary | ICD-10-CM

## 2016-12-28 DIAGNOSIS — Z952 Presence of prosthetic heart valve: Secondary | ICD-10-CM | POA: Diagnosis not present

## 2016-12-28 DIAGNOSIS — E785 Hyperlipidemia, unspecified: Secondary | ICD-10-CM

## 2016-12-28 DIAGNOSIS — I480 Paroxysmal atrial fibrillation: Secondary | ICD-10-CM | POA: Diagnosis not present

## 2016-12-28 DIAGNOSIS — I25810 Atherosclerosis of coronary artery bypass graft(s) without angina pectoris: Secondary | ICD-10-CM | POA: Diagnosis not present

## 2016-12-28 DIAGNOSIS — I10 Essential (primary) hypertension: Secondary | ICD-10-CM

## 2016-12-28 LAB — CBC
HEMATOCRIT: 38.7 % (ref 35.0–45.0)
HEMOGLOBIN: 12.5 g/dL (ref 11.7–15.5)
MCH: 30 pg (ref 27.0–33.0)
MCHC: 32.3 g/dL (ref 32.0–36.0)
MCV: 93 fL (ref 80.0–100.0)
MPV: 9.8 fL (ref 7.5–12.5)
Platelets: 165 10*3/uL (ref 140–400)
RBC: 4.16 MIL/uL (ref 3.80–5.10)
RDW: 14.7 % (ref 11.0–15.0)
WBC: 5.8 10*3/uL (ref 3.8–10.8)

## 2016-12-28 MED ORDER — APIXABAN 5 MG PO TABS: 5.0000 mg | ORAL_TABLET | Freq: Two times a day (BID) | ORAL | 1 refills | Status: DC

## 2016-12-28 MED ORDER — APIXABAN 5 MG PO TABS
5.0000 mg | ORAL_TABLET | Freq: Two times a day (BID) | ORAL | 1 refills | Status: DC
Start: 2016-12-28 — End: 2017-03-13

## 2016-12-28 NOTE — Patient Instructions (Signed)
Medication Instructions:  Stop aspirin Start eliquis 5mg  twice daily   If you need a refill on your cardiac medications before your next appointment, please call your pharmacy.  Labwork: CBC TODAY AT [facility] ON THE 1ST FLOOR  Follow-Up: KEEP FOLLOW UP APPOINTMENT WITH DR Stanford Breed 01-25-2017 @ 1030AM   Thank you for choosing [facility] at [place]!!    HAO MENG, Revonda Humphrey, LPN

## 2016-12-28 NOTE — Progress Notes (Signed)
Cardiology Office Note    Date:  12/28/2016   ID:  [place], Polanco [birth year], MRN [id number]  PCP:  Lynne Logan, MD  Cardiologist:  Dr. Stanford Breed   Chief Complaint  Patient presents with  . Follow-up    seen for Dr. Stanford Breed, palpitation    History of Present Illness:  Kayla Chambers is a 83 y.o. female with PMH of CAD s/p CABG, dilated aortic root s/p Bentall/bioprosthetic AVR and maze procedure. She also have history of hypothyroidism, paroxysmal atrial fibrillation, polymyalgia rheumatica, and an history of sarcoma. She underwent Bentall procedure by Dr. Servando Snare on 03/31/2013 which involved replacing her ascending aorta, and insertion of a bioprosthetic pericardial tissue valve for her aortic valve, as well as single-vessel CABG and maze procedure. Preoperatively, she has been in and out of atrial fibrillation. More recently, she has been able to maintain sinus rhythm. She is no longer on amiodarone and stopped her Xarelto due to bruising. Last echocardiogram in June 2015 showed normal LV function, biatrial enlargement, mean gradient across aortic valve 7 mmHg. Carotid Doppler in March 2017 showed mild 1-39% bilateral stenosis. She was last seen in September 2017, she was having some fatigue and dyspnea on exertion and no orthopnea or PND. She denied any chest pain at the time. She did have some minimal pedal edema which she was placed on 20 mg qod PRN Lasix.  In the past few weeks, she has been having intermittent palpitation at rest. She denies any obvious chest pain. She denies any significant exertional type of symptom. Otherwise she has no obvious cardiac awareness. Today the office EKG does shows that she has went back to atrial fibrillation. Despite the minimal ST depression in the lateral leads, she does not have any obvious angina. There is some motion artifact making interpretation difficult. Heart rate is mildly elevated in the 90s while in atrial fibrillation. Given the ST  depression in the lateral leads, I have reviewed the previous cardiac catheterization report (see below). It does not appears she underwent bypass surgery due to significant blockage, I think it was done as part of the Bentall procedure. Given the lack of obvious chest discomfort or angina symptom, I would hold off on ischemic workup. As far as atrial fibrillation, it seems she only has some cardiac awareness, the palpitations she described was to be very transient and only occurs at rest, in fact she has no cardiac awareness at this time while sitting in the office in atrial fibrillation. Therefore the total duration of recurrent atrial fibrillation is unknown. I have discussed with the patient, even though she has significant bruising with Xarelto before, I think with likely persistent atrial fibrillation she would have higher likelihood of stroke. She denies any obvious imbalance issue or history of significant bleeding. I think it would be beneficial for her to try a trial of eliquis. I will obtain a CBC today. I did discuss with Dr. Stanford Breed at the request of the patient and her husband who was also okay with a trial of eliquis. Patient is aware that eliquis compared to aspirin will be increased chance of bleeding, however risk of stroke is high whereas risk of bleeding is relatively on the lower side.   Past Medical History:  Diagnosis Date  . Anxiety   . Aortic insufficiency    a. 03/2013 s/p  AVR w/ biologic 49mm Magna Ease peric tissue valve, model 300TFX, ser# [id number].   Marland Kitchen CAD (coronary artery disease)    a.  01/2013 mod calcification w/o sev dzs;  b. 03/2013 CABG x1 VG->RCA @ time of AVR  . Chronic diastolic CHF (congestive heart failure) ([place])    a. 01/2013 echo: EF 50-55%.  . Dilated aortic root ([place])    a. 03/2013 s/p Bentall procedure with Ao Root replacement (62mm Valsalva graft) w/ reimplantationof the R and L coronary ostium.  . Fibromyalgia   . Headache(784.0)    hx migraines  .  Hypothyroidism   . Intracranial aneurysm    in late 1970's  . 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 Neoadjuvant RT by Dr. Yisroel Ramming. Surveillance plan: MRI 12/08/11 shows changes in presumed post-op seroma/hematoma with slight increase in size - follow up MRI 11/18/12 showed same size. Will continue with q3 month MRI. CT C/A/P annually alternating with CXR at 6 months.   . Osteoporosis   . Paroxysmal a-fib ([place])    a. Dx 01/2013 - placed on xarelto;  b. 03/2013 s/p left sided MAZE and LA clip @ time of AVR/CABG.  . Peripheral neuropathy ([place])   . PMR (polymyalgia rheumatica) (H[facility])    Inactive  . Rheumatic heart disease   . 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 Vertigo     Past Surgical History:  Procedure Laterality Date  . ABDOMINAL HYSTERECTOMY    . ASCENDING AORTIC ROOT REPLACEMENT N/A 03/31/2013   Procedure: ASCENDING AORTIC ROOT REPLACEMENT;  Surgeon: Grace Isaac, MD;  Location: [place];  Service: Open Heart Surgery;  Laterality: N/A;  . BLADDER SUSPENSION    . COLONOSCOPY  10/25/2011   Procedure: COLONOSCOPY;  Surgeon: Garlan Fair, MD;  Location: [facility];  Service: Endoscopy;  Laterality: N/A;  . EYE SURGERY Bilateral    cataracts  . INTRAOPERATIVE TRANSESOPHAGEAL ECHOCARDIOGRAM N/A 03/31/2013   Procedure: INTRAOPERATIVE TRANSESOPHAGEAL ECHOCARDIOGRAM;  Surgeon: Grace Isaac, MD;  Location: [place];  Service: Open Heart Surgery;  Laterality: N/A;  . LEFT AND RIGHT HEART CATHETERIZATION WITH CORONARY ANGIOGRAM Right 02/02/2013   Procedure: LEFT AND RIGHT HEART CATHETERIZATION WITH CORONARY ANGIOGRAM;  Surgeon: Hillary Bow, MD;  Location: [facility] C[facility] LAB;  Service: Cardiovascular;  Laterality: Right;  Marland Kitchen MAZE N/A 03/31/2013   Procedure: MAZE;  Surgeon: Grace Isaac, MD;  Location: [place];  Service: Open Heart Surgery;  Laterality:  N/A;  . Sarcoma resection  2011    Current Medications: Outpatient Medications Prior to Visit  Medication Sig Dispense Refill  . ALPRAZolam (XANAX) 0.5 MG tablet Take 0.5 mg by mouth at bedtime as needed for anxiety (for sleep).    . Ascorbic Acid (VITAMIN C PO) Take 1 tablet by mouth daily.    Marland Kitchen b complex vitamins tablet Take 1 tablet by mouth daily.    . Calcium Carbonate-Vitamin D (CALCIUM + D PO) Take 1 tablet by mouth daily.     Marland Kitchen CRANBERRY PO Take 1 tablet by mouth 2 (two) times daily.     . furosemide (LASIX) 20 MG tablet One tablet every other day as needed for swelling. 15 tablet 6  . lactase (LACTAID) 3000 UNITS tablet Take 1 tablet by mouth daily as needed (only when eating dairy).    Marland Kitchen levothyroxine (SYNTHROID, LEVOTHROID) 75 MCG tablet Take 75 mcg by mouth daily.      . Loperamide HCl (LOPERAMIDE A-D PO) Take 1 tablet by mouth 4 (four) times  daily as needed (diarrhea).     . metoprolol (LOPRESSOR) 50 MG tablet Take 0.5 tablets (25 mg total) by mouth 2 (two) times daily. 90 tablet 3  . Multiple Vitamin (MULTIVITAMIN WITH MINERALS) TABS Take 1 tablet by mouth daily.    . pravastatin (PRAVACHOL) 40 MG tablet Take 1 tablet (40 mg total) by mouth every evening. 90 tablet 3  . ROPINIROLE HCL PO Take 1 tablet by mouth daily as needed.    Marland Kitchen aspirin 81 MG tablet Take 81 mg by mouth daily.     No facility-administered medications prior to visit.      Allergies:   Alendronate sodium; Ambien [zolpidem tartrate]; Lac bovis; Lyrica [pregabalin]; Milk-related compounds; Neurontin [gabapentin]; Procaine hcl; and Toprol xl [metoprolol succinate]   Social History   Social History  . Marital status: Married    Spouse name: N/A  . Number of children: N/A  . Years of education: N/A   Social History Main Topics  . Smoking status: Never Smoker  . Smokeless tobacco: Never Used  . Alcohol use No  . Drug use: No  . Sexual activity: Yes   Other Topics Concern  . None   Social History  Narrative  . None     Family History:  The patient's family history includes Anesthesia problems in her brother; Arthritis in her father; Cancer in her father; Heart disease in her mother; Hypertension in her sister; Stroke in her mother.   ROS:   Please see the history of present illness.    ROS All other systems reviewed and are negative.   PHYSICAL EXAM:   VS:  BP 119/78   Pulse 90   Ht 5\' 5"  (1.651 m)   Wt 138 lb (62.6 kg)   BMI 22.96 kg/m    GEN: Well nourished, well developed, in no acute distress  HEENT: normal  Neck: no JVD, carotid bruits, or masses Cardiac: irregularly irregular; no murmurs, rubs, or gallops,no edema  Respiratory:  clear to auscultation bilaterally, normal work of breathing GI: soft, nontender, nondistended, + BS MS: no deformity or atrophy  Skin: warm and dry, no rash Neuro:  Alert and Oriented x 3, Strength and sensation are intact Psych: euthymic mood, full affect  Wt Readings from Last 3 Encounters:  12/28/16 138 lb (62.6 kg)  08/06/16 134 lb (60.8 kg)  02/02/16 132 lb (59.9 kg)      Studies/Labs Reviewed:   EKG:  EKG is ordered today.  The ekg ordered today demonstrates Atrial fibrillation with heart rate 91, mild ST depression in lead V5 and V6 along with motion artifact  Recent Labs: 05/04/2016: ALT 11 09/19/2016: BUN 16; Creat 0.70; Potassium 4.9; Sodium 138 12/28/2016: Hemoglobin 12.5; Platelets 165   Lipid Panel    Component Value Date/Time   CHOL 148 05/04/2016 0820   TRIG 76 05/04/2016 0820   HDL 65 05/04/2016 0820   CHOLHDL 2.3 05/04/2016 0820   VLDL 15 05/04/2016 0820   LDLCALC 68 05/04/2016 0820    Additional studies/ records that were reviewed today include:   Cath 02/02/2013 Coronary angiography: Coronary dominance: right  Left mainstem: Short and without obstruction  Left anterior descending (LAD): Moderate calcification with about 30% luminal irregularity proximally. No critical obstruction.  The vessel  terminates as an apical LAD and large distal diagonal.    Left circumflex (LCx): Consists mainly of a large OM branch.  There is about 40% segmental plaquing just beyond the takeoff of the small AV circumflex.  Right coronary artery (RCA): Large caliber vessel with 30% proximal narrowing that is eccentric.  There is a large caliber PDA and smaller PDA, both free of major disease.   Left ventriculography: There was ectopy with LV angiography, so assessment of LV function is not reliable.  EF estimate would be 50%, but unreliable.   Aortography reveals a very large aortic root down involving the sinus of valsalva.  There is mild calcification of the valve.  There is moderately severe aortic regurgitation  (3 plus) noted.     Final Conclusions:   1.  Aortic regurgitation, moderately severe 2.  Dilated LV with likely reduction in LV function 3.  Recent onset of atrial fibrillation 4.  Moderate coronary calcification, without critical obstruction.  Recommendations:  1.  Defer surgical decisions to patient with Dr Mare Ferrari. 2.  Rate control. 3.  Given AI and wide pulse pressure, and very small size will defer reanticoagulation to am.  Would consider low dose heparin for 1-2 days before consideration a NOAC given AI and risk of bleeding.     ASSESSMENT:    1. Paroxysmal atrial fibrillation (HCC)   2. Coronary artery disease involving coronary bypass graft of native heart without angina pectoris   3. Medication management   4. H/O aortic root repair   5. Hx of replacement of aortic valve   6. Essential hypertension   7. Hyperlipidemia, unspecified hyperlipidemia type      PLAN:  In order of problems listed above:  1. Proximal atrial fibrillation: Unknown duration, it is possible her atrial fibrillation is persistent by this point. Fortunately she is rate controlled at baseline. She is currently on aspirin, to be on Xarelto however eventually came off of Xarelto due to too much  bruising. She denies any obvious bleeding issues or imbalance issue that would make her prone to fall. I did discuss with her that on aspirin, she would still have higher likelihood of stroke, and I eventually discussed with Dr. Stanford Breed at the request of the patient, she was agreeable to try eliquis as a trial. Her weight is 62 kg, age 8, and Cr normal. If her weight were less than 60 kg, she would qualify for 2.5 mg twice a day of eliquis, at this time, she will need 5 mg twice a day of eliquis. I will defer to Dr. Stanford Breed regarding whether or not to lower the eliquis to 2.5 mg twice a day as she is right near the borderline, and likely without clothing, her weight would be less than 60 kg.  2. Aortic root dilatation s/p Bental and bioprosthetic AVR: No acute issue. She did have both bypass surgery and maze procedure during the same surgery.  3. CAD s/p CABG: She had her bypass surgery at the same time as the Bental procedure, her cardiac cath report has been reviewed, prior to surgery, she did not have significant coronary artery disease including 30% proximal LAD lesion, 40% mid to distal left circumflex lesion. For some reason her EKG today does have minimal ST depression in the lateral leads while in atrial fibrillation with heart rate of 90s. However she does not have any angina symptom nor does she have any chest pain. There is currently no clear indication for ischemic workup with lack of symptoms.  4. Hypertension: Blood pressure well-controlled. May consider increase beta blocker to 50 mg twice a day if she does have problem with rate control later  5. Hyperlipidemia: Continue pravastatin  6. Hypothyroidism: On Synthroid.  Medication Adjustments/Labs and Tests Ordered: Current medicines are reviewed at length with the patient today.  Concerns regarding medicines are outlined above.  Medication changes, Labs and Tests ordered today are listed in the Patient Instructions below. Patient  Instructions  Medication Instructions:  Stop aspirin Start eliquis 5mg  twice daily   If you need a refill on your cardiac medications before your next appointment, please call your pharmacy.  Labwork: CBC TODAY AT [facility] ON THE 1ST FLOOR  Follow-Up: KEEP FOLLOW UP APPOINTMENT WITH DR Stanford Breed 01-25-2017 @ 1030AM   Thank you for choosing [facility] at [facility]!!    Amma Crear, PA-C [place], LPN     Hilbert Corrigan, [place]  12/28/2016 11:46 PM    [facility] [place], [place], [place]  [address] Phone: [phone]; Fax: [phone]

## 2017-01-04 ENCOUNTER — Telehealth: Payer: Self-pay | Admitting: Cardiology

## 2017-01-04 NOTE — Telephone Encounter (Signed)
Spoke with pt husband, they would like a sooner appointment. Follow up scheduled

## 2017-01-04 NOTE — Telephone Encounter (Signed)
Pt's husband called   Her husband would like pt to be seen sooner ...   High plus 85-90 range  bp 119/78

## 2017-01-09 ENCOUNTER — Ambulatory Visit (INDEPENDENT_AMBULATORY_CARE_PROVIDER_SITE_OTHER): Payer: Medicare Other | Admitting: Cardiology

## 2017-01-09 ENCOUNTER — Encounter: Payer: Self-pay | Admitting: Cardiology

## 2017-01-09 VITALS — BP 110/68 | HR 82 | Ht 65.0 in | Wt 138.4 lb

## 2017-01-09 DIAGNOSIS — I2510 Atherosclerotic heart disease of native coronary artery without angina pectoris: Secondary | ICD-10-CM

## 2017-01-09 DIAGNOSIS — I25810 Atherosclerosis of coronary artery bypass graft(s) without angina pectoris: Secondary | ICD-10-CM

## 2017-01-09 DIAGNOSIS — I4891 Unspecified atrial fibrillation: Secondary | ICD-10-CM | POA: Diagnosis not present

## 2017-01-09 NOTE — Progress Notes (Signed)
    HPI: Follow-up CAD status post coronary artery bypass graft, Bentall/AVR and maze procedure. She underwent a Bentall procedure by Dr. Gerhardt on 03/31/13. 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 Preoperatively she had been in and out of atrial fibrillation. More recently she has been maintaining normal sinus rhythm.Last echocardiogram June 2015 showed normal LV systolic function, biatrial enlargement, mean gradient across aortic valve of 7 mmHg. Carotid Dopplers March 2017 showed 1-39% bilateral stenosis. Seen recently and noted to be in recurrent atrial fibrillation; placed on apixaban (xarelto DCed previously due to bruising). Since last seen, she has had a recent flulike symptoms and upper respiratory infection. Since that time she notes increased dyspnea, fatigue, mild pedal edema and some palpitations. No chest pain.  Current Outpatient Prescriptions  Medication Sig Dispense Refill  . ALPRAZolam (XANAX) 0.5 MG tablet Take 0.5 mg by mouth at bedtime as needed for anxiety (for sleep).    . apixaban (ELIQUIS) 5 MG TABS tablet Take 1 tablet (5 mg total) by mouth 2 (two) times daily. 60 tablet 1  . Ascorbic Acid (VITAMIN C PO) Take 1 tablet by mouth daily.    . b complex vitamins tablet Take 1 tablet by mouth daily.    . Calcium Carbonate-Vitamin D (CALCIUM + D PO) Take 1 tablet by mouth daily.     . CRANBERRY PO Take 1 tablet by mouth 2 (two) times daily.     . furosemide (LASIX) 20 MG tablet One tablet every other day as needed for swelling. 15 tablet 6  . lactase (LACTAID) 3000 UNITS tablet Take 1 tablet by mouth daily as needed (only when eating dairy).    . levothyroxine (SYNTHROID, LEVOTHROID) 75 MCG tablet Take 75 mcg by mouth daily.      . Loperamide HCl (LOPERAMIDE A-D PO) Take 1 tablet by mouth 4 (four) times daily as needed (diarrhea).     .  metoprolol (LOPRESSOR) 50 MG tablet Take 0.5 tablets (25 mg total) by mouth 2 (two) times daily. 90 tablet 3  . Multiple Vitamin (MULTIVITAMIN WITH MINERALS) TABS Take 1 tablet by mouth daily.    . pravastatin (PRAVACHOL) 40 MG tablet Take 1 tablet (40 mg total) by mouth every evening. 90 tablet 3  . ROPINIROLE HCL PO Take 1 tablet by mouth daily as needed.     No current facility-administered medications for this visit.      Past Medical History:  Diagnosis Date  . Anxiety   . Aortic insufficiency    a. 03/2013 s/p  AVR w/ biologic 25mm Magna Ease peric tissue valve, model 300TFX, ser# [id number].   . CAD (coronary artery disease)    a. 01/2013 mod calcification w/o sev dzs;  b. 03/2013 CABG x1 VG->RCA @ time of AVR  . Chronic diastolic CHF (congestive heart failure) (HCC)    a. 01/2013 echo: EF 50-55%.  . Dilated aortic root (H[facility])    a. 03/2013 s/p Bentall procedure with Ao Root replacement (28mm Valsalva graft) w/ reimplantationof the R and L coronary ostium.  . Fibromyalgia   . Headache(784.0)    hx migraines  . Hypothyroidism   . Intracranial aneurysm    in late 1970's  . 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 Neoadjuvant RT by Dr. McMullen. Surveillance plan: MRI 12/08/11 shows   changes in presumed post-op seroma/hematoma with slight increase in size - follow up MRI 11/18/12 showed same size. Will continue with q3 month MRI. CT C/A/P annually alternating with CXR at 6 months.   . Osteoporosis   . Paroxysmal a-fib (HCC)    a. Dx 01/2013 - placed on xarelto;  b. 03/2013 s/p left sided MAZE and LA clip @ time of AVR/CABG.  . Peripheral neuropathy ([facility])   . PMR (polymyalgia rheumatica) ([facility])    Inactive  . Rheumatic heart disease   . 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 Vertigo     Past Surgical History:  Procedure Laterality Date  . ABDOMINAL  HYSTERECTOMY    . ASCENDING AORTIC ROOT REPLACEMENT N/A 03/31/2013   Procedure: ASCENDING AORTIC ROOT REPLACEMENT;  Surgeon: Edward B Gerhardt, MD;  Location: [facility];  Service: Open Heart Surgery;  Laterality: N/A;  . BLADDER SUSPENSION    . COLONOSCOPY  10/25/2011   Procedure: COLONOSCOPY;  Surgeon: Martin K Johnson, MD;  [facility]: [facility];  Service: Endoscopy;  Laterality: N/A;  . EYE SURGERY Bilateral    cataracts  . INTRAOPERATIVE TRANSESOPHAGEAL ECHOCARDIOGRAM N/A 03/31/2013   Procedure: INTRAOPERATIVE TRANSESOPHAGEAL ECHOCARDIOGRAM;  Surgeon: Edward B Gerhardt, MD;  Location: [facility];  Service: Open Heart Surgery;  Laterality: N/A;  . LEFT AND RIGHT HEART CATHETERIZATION WITH CORONARY ANGIOGRAM Right 02/02/2013   Procedure: LEFT AND RIGHT HEART CATHETERIZATION WITH CORONARY ANGIOGRAM;  Surgeon: Thomas D Stuckey, MD;  Location: [facility] CATH LAB;  Service: Cardiovascular;  Laterality: Right;  . MAZE N/A 03/31/2013   Procedure: MAZE;  Surgeon: Edward B Gerhardt, MD;  Location: [facility];  Service: Open Heart Surgery;  Laterality: N/A;  . Sarcoma resection  2011    Social History   Social History  . Marital status: Married    Spouse name: N/A  . Number of children: N/A  . Years of education: N/A   Occupational History  . Not on file.   Social History Main Topics  . Smoking status: Never Smoker  . Smokeless tobacco: Never Used  . Alcohol use No  . Drug use: No  . Sexual activity: Yes   Other Topics Concern  . Not on file   Social History Narrative  . No narrative on file    Family History  Problem Relation Age of Onset  . Heart disease Mother   . Stroke Mother   . Arthritis Father   . Cancer Father   . Anesthesia problems Brother   . Hypertension Sister     ROS: no fevers or chills, productive cough, hemoptysis, dysphasia, odynophagia, melena, hematochezia, dysuria, hematuria, rash, seizure activity, orthopnea, PND, pedal edema, claudication. Remaining systems are  negative.  Physical Exam: Well-developed well-nourished in no acute distress.  Skin is warm and dry.  HEENT is normal.  Neck is supple.  Chest is clear to auscultation with normal expansion.  Cardiovascular exam is irregular Abdominal exam nontender or distended. No masses palpated. Extremities show trace to 1+ edema. neuro grossly intact   A/P  1 Paroxysmal atrial fibrillation-patient recently noted to have recurrent atrial fibrillation and remains today. Continue apixaban. Check Hgb and renal function. Continue metoprolol for rate control. Long discussion today concerning options of rhythm control versus rate control. She is having mild increased dyspnea and occasional palpitations. She notes increased fatigue. However she feels this may be related to recent upper respiratory infection. We have decided to see her back in   4 weeks. If her symptoms persist likely pursue cardioversion. If not then rate control may be best option. Echocardiogram and TSH.  2 coronary artery disease-continue statin. No aspirin given need for anticoagulation.  3 hypertension-blood pressure controlled. Continue present medications.  4 status post aortic valve replacement with tissue valve-continue SBE prophylaxis.  5 hyperlipidemia-continue statin.  6 carotid artery stenosis-mild on most recent Dopplers.  Brian Crenshaw, MD  

## 2017-01-09 NOTE — Patient Instructions (Signed)
Medication Instructions:   NO CHANGE  Labwork:  Your physician recommends that you return for lab work in:2 WEEKS  Testing/Procedures:  Your physician has requested that you have an echocardiogram. Echocardiography is a painless test that uses sound waves to create images of your heart. It provides your doctor with information about the size and shape of your heart and how well your heart's chambers and valves are working. This procedure takes approximately one hour. There are no restrictions for this procedure.    Follow-Up:  Your physician recommends that you schedule a follow-up appointment in: [place]

## 2017-01-22 DIAGNOSIS — I4891 Unspecified atrial fibrillation: Secondary | ICD-10-CM | POA: Diagnosis not present

## 2017-01-22 LAB — CBC
HEMATOCRIT: 41.5 % (ref 35.0–45.0)
HEMOGLOBIN: 13.3 g/dL (ref 11.7–15.5)
MCH: 30 pg (ref 27.0–33.0)
MCHC: 32 g/dL (ref 32.0–36.0)
MCV: 93.7 fL (ref 80.0–100.0)
MPV: 9.6 fL (ref 7.5–12.5)
Platelets: 146 10*3/uL (ref 140–400)
RBC: 4.43 MIL/uL (ref 3.80–5.10)
RDW: 15.3 % — ABNORMAL HIGH (ref 11.0–15.0)
WBC: 5.8 10*3/uL (ref 3.8–10.8)

## 2017-01-23 ENCOUNTER — Other Ambulatory Visit: Payer: Self-pay

## 2017-01-23 ENCOUNTER — Ambulatory Visit (HOSPITAL_COMMUNITY): Payer: Medicare Other | Attending: Cardiology

## 2017-01-23 DIAGNOSIS — I071 Rheumatic tricuspid insufficiency: Secondary | ICD-10-CM | POA: Insufficient documentation

## 2017-01-23 DIAGNOSIS — Z923 Personal history of irradiation: Secondary | ICD-10-CM | POA: Diagnosis not present

## 2017-01-23 DIAGNOSIS — I509 Heart failure, unspecified: Secondary | ICD-10-CM | POA: Diagnosis not present

## 2017-01-23 DIAGNOSIS — Z8249 Family history of ischemic heart disease and other diseases of the circulatory system: Secondary | ICD-10-CM | POA: Diagnosis not present

## 2017-01-23 DIAGNOSIS — I2510 Atherosclerotic heart disease of native coronary artery without angina pectoris: Secondary | ICD-10-CM | POA: Diagnosis not present

## 2017-01-23 DIAGNOSIS — Z859 Personal history of malignant neoplasm, unspecified: Secondary | ICD-10-CM | POA: Insufficient documentation

## 2017-01-23 DIAGNOSIS — I371 Nonrheumatic pulmonary valve insufficiency: Secondary | ICD-10-CM | POA: Insufficient documentation

## 2017-01-23 DIAGNOSIS — I4891 Unspecified atrial fibrillation: Secondary | ICD-10-CM | POA: Diagnosis not present

## 2017-01-23 DIAGNOSIS — I340 Nonrheumatic mitral (valve) insufficiency: Secondary | ICD-10-CM | POA: Diagnosis not present

## 2017-01-23 LAB — BASIC METABOLIC PANEL
BUN: 14 mg/dL (ref 7–25)
CHLORIDE: 105 mmol/L (ref 98–110)
CO2: 28 mmol/L (ref 20–31)
Calcium: 8.9 mg/dL (ref 8.6–10.4)
Creat: 0.68 mg/dL (ref 0.60–0.88)
GLUCOSE: 92 mg/dL (ref 65–99)
POTASSIUM: 4.7 mmol/L (ref 3.5–5.3)
Sodium: 143 mmol/L (ref 135–146)

## 2017-01-23 LAB — TSH: TSH: 4.32 m[IU]/L

## 2017-01-25 ENCOUNTER — Telehealth: Payer: Self-pay | Admitting: *Deleted

## 2017-01-25 ENCOUNTER — Ambulatory Visit: Payer: Medicare Other | Admitting: Cardiology

## 2017-01-25 NOTE — Telephone Encounter (Addendum)
-----   Message from Lelon Perla, MD sent at 01/23/2017  8:29 PM EST ----- Schedule fuov, new right side enlargement and severe tr [name]  Left message for pt to call, ov 02-11-17.

## 2017-01-28 ENCOUNTER — Other Ambulatory Visit: Payer: Self-pay | Admitting: *Deleted

## 2017-01-28 DIAGNOSIS — I4891 Unspecified atrial fibrillation: Secondary | ICD-10-CM

## 2017-01-28 NOTE — Progress Notes (Signed)
HPI: Follow-up CADstatus post coronary artery bypass graft, Bentall/AVR and maze procedure. She underwent a Bentall procedure by Dr. Servando Snare on 03/31/13. 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 Preoperatively she had been in and out of atrial fibrillation. Carotid Dopplers March 2017 showed 1-39% bilateral stenosis. Seen recently with recurrent atrial fibrillation following URI. Echo 3/18 showed normal LV function; s/p AVR with normal gradients and no AI; mild MR; biatrial enlargement; moderate RVE; severe TR. Seen recently and noted to be in recurrent atrial fibrillation; placed on apixaban (xarelto DCed previously due to bruising). Had cardioversion on 01/29/2017. Since last seen, she denies dyspnea, chest pain, palpitations, syncope or bleeding. Chronic edema left lower extremity.  Current Outpatient Prescriptions  Medication Sig Dispense Refill  . ALPRAZolam (XANAX) 0.5 MG tablet Take 0.5 mg by mouth at bedtime as needed for anxiety (for sleep).    Marland Kitchen apixaban (ELIQUIS) 5 MG TABS tablet Take 1 tablet (5 mg total) by mouth 2 (two) times daily. 60 tablet 1  . Ascorbic Acid (VITAMIN C PO) Take 1 tablet by mouth daily.    Marland Kitchen b complex vitamins tablet Take 1 tablet by mouth daily.    . Calcium Carbonate-Vitamin D (CALCIUM + D PO) Take 1 tablet by mouth daily.     Marland Kitchen CRANBERRY PO Take 1 tablet by mouth 2 (two) times daily.     . furosemide (LASIX) 20 MG tablet One tablet every other day as needed for swelling. 15 tablet 6  . lactase (LACTAID) 3000 UNITS tablet Take 1 tablet by mouth daily as needed (only when eating dairy).    Marland Kitchen levothyroxine (SYNTHROID, LEVOTHROID) 75 MCG tablet Take 75 mcg by mouth daily.      . Loperamide HCl (LOPERAMIDE A-D PO) Take 1 tablet by mouth 4 (four) times daily as needed (diarrhea).     . metoprolol (LOPRESSOR) 50 MG tablet Take 0.5  tablets (25 mg total) by mouth 2 (two) times daily. 90 tablet 3  . Multiple Vitamin (MULTIVITAMIN WITH MINERALS) TABS Take 1 tablet by mouth daily.    . pravastatin (PRAVACHOL) 40 MG tablet Take 1 tablet (40 mg total) by mouth every evening. 90 tablet 3  . ROPINIROLE HCL PO Take 1 tablet by mouth daily as needed.     No current facility-administered medications for this visit.      Past Medical History:  Diagnosis Date  . Anxiety   . Aortic insufficiency    a. 03/2013 s/p  AVR w/ biologic 2mm Magna Ease peric tissue valve, model 300TFX, ser# [id number].   Marland Kitchen CAD (coronary artery disease)    a. 01/2013 mod calcification w/o sev dzs;  b. 03/2013 CABG x1 VG->RCA @ time of AVR  . Chronic diastolic CHF (congestive heart failure) ([place])    a. 01/2013 echo: EF 50-55%.  . Dilated aortic root ([place])    a. 03/2013 s/p Bentall procedure with Ao Root replacement (11mm Valsalva graft) w/ reimplantationof the R and L coronary ostium.  . Fibromyalgia   . Headache(784.0)    hx migraines  . Hypothyroidism   . Intracranial aneurysm    in late 1970's  . 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 Neoadjuvant RT by Dr. Yisroel Ramming. Surveillance plan: MRI 12/08/11 shows changes in presumed post-op seroma/hematoma with slight increase  in size - follow up MRI 11/18/12 showed same size. Will continue with q3 month MRI. CT C/A/P annually alternating with CXR at 6 months.   . Osteoporosis   . Paroxysmal a-fib ([place])    a. Dx 01/2013 - placed on xarelto;  b. 03/2013 s/p left sided MAZE and LA clip @ time of AVR/CABG.  . Peripheral neuropathy ([place])   . PMR (polymyalgia rheumatica) (HCC)    Inactive  . Rheumatic heart disease   . 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 Vertigo     Past Surgical History:  Procedure Laterality Date  . ABDOMINAL HYSTERECTOMY    . ASCENDING AORTIC ROOT  REPLACEMENT N/A 03/31/2013   Procedure: ASCENDING AORTIC ROOT REPLACEMENT;  Surgeon: Grace Isaac, MD;  Location: [place];  Service: Open Heart Surgery;  Laterality: N/A;  . BLADDER SUSPENSION    . CARDIOVERSION N/A 01/29/2017   Procedure: CARDIOVERSION;  Surgeon: Jerline Pain, MD;  Location: [place];  Service: Cardiovascular;  Laterality: N/A;  . COLONOSCOPY  10/25/2011   Procedure: COLONOSCOPY;  Surgeon: Garlan Fair, MD;  Location: [facility];  Service: Endoscopy;  Laterality: N/A;  . EYE SURGERY Bilateral    cataracts  . INTRAOPERATIVE TRANSESOPHAGEAL ECHOCARDIOGRAM N/A 03/31/2013   Procedure: INTRAOPERATIVE TRANSESOPHAGEAL ECHOCARDIOGRAM;  Surgeon: Grace Isaac, MD;  Location: [place];  Service: Open Heart Surgery;  Laterality: N/A;  . LEFT AND RIGHT HEART CATHETERIZATION WITH CORONARY ANGIOGRAM Right 02/02/2013   Procedure: LEFT AND RIGHT HEART CATHETERIZATION WITH CORONARY ANGIOGRAM;  Surgeon: Hillary Bow, MD;  [facility] C[facility] LAB;  Service: Cardiovascular;  Laterality: Right;  Marland Kitchen MAZE N/A 03/31/2013   Procedure: MAZE;  Surgeon: Grace Isaac, MD;  Location: [place];  Service: Open Heart Surgery;  Laterality: N/A;  . Sarcoma resection  2011    Social History   Social History  . Marital status: Married    Spouse name: N/A  . Number of children: N/A  . Years of education: N/A   Occupational History  . Not on file.   Social History Main Topics  . Smoking status: Never Smoker  . Smokeless tobacco: Never Used  . Alcohol use No  . Drug use: No  . Sexual activity: Yes   Other Topics Concern  . Not on file   Social History Narrative  . No narrative on file    Family History  Problem Relation Age of Onset  . Heart disease Mother   . Stroke Mother   . Arthritis Father   . Cancer Father   . Anesthesia problems Brother   . Hypertension Sister     ROS: no fevers or chills, productive cough, hemoptysis, dysphasia, odynophagia, melena, hematochezia,  dysuria, hematuria, rash, seizure activity, orthopnea, PND, claudication. Remaining systems are negative.  Physical Exam: Well-developed well-nourished in no acute distress.  Skin is warm and dry.  HEENT is normal.  Neck is supple.  Chest is clear to auscultation with normal expansion.  Cardiovascular exam is regular rate and rhythm. 2/6 systolic murmur left sternal border. Abdominal exam nontender or distended. No masses palpated. Extremities show 1+ edema on left. neuro grossly intact  ECG- Sinus rhythm at a rate of 61. Cannot rule out prior septal infarct. Nonspecific ST changes. Cannot rule out prior inferior infarct. personally reviewed  A/P  1 Paroxysmal atrial fibrillation-patient remains in sinus rhythm status post cardioversion. Continue metoprolol. Continue apixaban. If she has more frequent episodes  in the future we will consider an antiarrhythmic such as amiodarone.  2 coronary artery disease-continue statin. No aspirin given need for anticoagulation.  3 hypertension-blood pressure controlled. Continue present medications.  4 hyperlipidemia-continue statin.  5 status post aortic valve replacement-continue SBE prophylaxis.  6 carotid artery disease-mild on most recent Dopplers.  7 tricuspid regurgitation-plan conservative measures given age.  [name], MD

## 2017-01-28 NOTE — Telephone Encounter (Signed)
Follow up.

## 2017-01-28 NOTE — Telephone Encounter (Signed)
Spoke with pt husband, DCCV scheduled for 01-29-17 @ 1 pm with dr mark skains. The patient was instructed to arrive between 11:30 am and 12 noon. The patient was instructed to take all medications except to hold metoprolol tomorrow morning. Patient husband voiced understanding of how important it is to not miss any eliquis and to make sure to take it in the morning. Orders placed.

## 2017-01-28 NOTE — Telephone Encounter (Signed)
Ok to arrange DCCV now with any physician available (make sure patient taking apixaban 5 BID and has not missed any doses in last 3 weeks). Please place orders; check BMET and CBC [name], MD

## 2017-01-28 NOTE — Telephone Encounter (Signed)
Spoke with pt husband, aware of echo results. He reports the patient feels her heart beating hard and she feels it is racing. They have not checked her bp or pulse. They are questioning if the DCCV could be done sooner. She has a follow up appointment 02-11-17. Will forward for dr Stanford Breed review

## 2017-01-29 ENCOUNTER — Ambulatory Visit (HOSPITAL_COMMUNITY): Payer: Medicare Other | Admitting: Anesthesiology

## 2017-01-29 ENCOUNTER — Ambulatory Visit (HOSPITAL_COMMUNITY)
Admission: RE | Admit: 2017-01-29 | Discharge: 2017-01-29 | Disposition: A | Discharge status: Home / Self Care | Payer: Medicare Other | Source: Ambulatory Visit | Attending: Cardiology | Admitting: Cardiology

## 2017-01-29 ENCOUNTER — Encounter (HOSPITAL_COMMUNITY)
Admission: RE | Disposition: A | Discharge status: Home / Self Care | Payer: Self-pay | Source: Ambulatory Visit | Attending: Cardiology

## 2017-01-29 ENCOUNTER — Encounter (HOSPITAL_COMMUNITY): Payer: Self-pay

## 2017-01-29 DIAGNOSIS — I110 Hypertensive heart disease with heart failure: Secondary | ICD-10-CM | POA: Diagnosis not present

## 2017-01-29 DIAGNOSIS — E785 Hyperlipidemia, unspecified: Secondary | ICD-10-CM | POA: Insufficient documentation

## 2017-01-29 DIAGNOSIS — I6529 Occlusion and stenosis of unspecified carotid artery: Secondary | ICD-10-CM | POA: Insufficient documentation

## 2017-01-29 DIAGNOSIS — Z951 Presence of aortocoronary bypass graft: Secondary | ICD-10-CM | POA: Insufficient documentation

## 2017-01-29 DIAGNOSIS — I4891 Unspecified atrial fibrillation: Secondary | ICD-10-CM | POA: Diagnosis not present

## 2017-01-29 DIAGNOSIS — Z8249 Family history of ischemic heart disease and other diseases of the circulatory system: Secondary | ICD-10-CM | POA: Diagnosis not present

## 2017-01-29 DIAGNOSIS — I2510 Atherosclerotic heart disease of native coronary artery without angina pectoris: Secondary | ICD-10-CM | POA: Insufficient documentation

## 2017-01-29 DIAGNOSIS — I739 Peripheral vascular disease, unspecified: Secondary | ICD-10-CM | POA: Insufficient documentation

## 2017-01-29 DIAGNOSIS — F419 Anxiety disorder, unspecified: Secondary | ICD-10-CM | POA: Diagnosis not present

## 2017-01-29 DIAGNOSIS — E039 Hypothyroidism, unspecified: Secondary | ICD-10-CM | POA: Insufficient documentation

## 2017-01-29 DIAGNOSIS — I480 Paroxysmal atrial fibrillation: Secondary | ICD-10-CM | POA: Diagnosis not present

## 2017-01-29 DIAGNOSIS — Z952 Presence of prosthetic heart valve: Secondary | ICD-10-CM | POA: Insufficient documentation

## 2017-01-29 DIAGNOSIS — Z923 Personal history of irradiation: Secondary | ICD-10-CM | POA: Insufficient documentation

## 2017-01-29 DIAGNOSIS — J40 Bronchitis, not specified as acute or chronic: Secondary | ICD-10-CM | POA: Diagnosis not present

## 2017-01-29 DIAGNOSIS — I5032 Chronic diastolic (congestive) heart failure: Secondary | ICD-10-CM | POA: Diagnosis not present

## 2017-01-29 DIAGNOSIS — Z7901 Long term (current) use of anticoagulants: Secondary | ICD-10-CM | POA: Insufficient documentation

## 2017-01-29 DIAGNOSIS — Z79899 Other long term (current) drug therapy: Secondary | ICD-10-CM | POA: Insufficient documentation

## 2017-01-29 HISTORY — PX: CARDIOVERSION: SHX1299

## 2017-01-29 SURGERY — CARDIOVERSION
Anesthesia: General

## 2017-01-29 MED ORDER — LIDOCAINE HCL (CARDIAC) 20 MG/ML IV SOLN
INTRAVENOUS | Status: DC | PRN
  Administered 2017-01-29: 40 mg via INTRAVENOUS

## 2017-01-29 MED ORDER — SODIUM CHLORIDE 0.9 % IV SOLN: 250.0000 mL | INTRAVENOUS | Status: DC

## 2017-01-29 MED ORDER — PROPOFOL 10 MG/ML IV BOLUS
INTRAVENOUS | Status: DC | PRN
Start: 2017-01-29 — End: 2017-01-29
  Administered 2017-01-29: 60 mg via INTRAVENOUS

## 2017-01-29 MED ORDER — SODIUM CHLORIDE 0.9% FLUSH: 3.0000 mL | INTRAVENOUS | Status: DC | PRN

## 2017-01-29 MED ORDER — SODIUM CHLORIDE 0.9 % IV SOLN
250.0000 mL | INTRAVENOUS | Status: DC
  Administered 2017-01-29: 250 mL via INTRAVENOUS

## 2017-01-29 MED ORDER — SODIUM CHLORIDE 0.9% FLUSH: 3.0000 mL | Freq: Two times a day (BID) | INTRAVENOUS | Status: DC

## 2017-01-29 NOTE — Discharge Instructions (Signed)
Electrical Cardioversion, Care After °This sheet gives you information about how to care for yourself after your procedure. Your health care provider may also give you more specific instructions. If you have problems or questions, contact your health care provider. °What can I expect after the procedure? °After the procedure, it is common to have: °· Some redness on the skin where the shocks were given. °Follow these instructions at home: °· Do not drive for 24 hours if you were given a medicine to help you relax (sedative). °· Take over-the-counter and prescription medicines only as told by your health care provider. °· Ask your health care provider how to check your pulse. Check it often. °· Rest for 48 hours after the procedure or as told by your health care provider. °· Avoid or limit your caffeine use as told by your health care provider. °Contact a health care provider if: °· You feel like your heart is beating too quickly or your pulse is not regular. °· You have a serious muscle cramp that does not go away. °Get help right away if: °· You have discomfort in your chest. °· You are dizzy or you feel faint. °· You have trouble breathing or you are short of breath. °· Your speech is slurred. °· You have trouble moving an arm or leg on one side of your body. °· Your fingers or toes turn cold or blue. °This information is not intended to replace advice given to you by your health care provider. Make sure you discuss any questions you have with your health care provider. °Document Released: 08/26/2013 Document Revised: 06/08/2016 Document Reviewed: 05/11/2016 °Elsevier Interactive Patient Education © 2017 [employer] Inc[employer] ° °

## 2017-01-29 NOTE — CV Procedure (Signed)
    Electrical Cardioversion Procedure Note BRADEN DELOACH [id number] Oct 17, 1927  Procedure: Electrical Cardioversion Indications:  Atrial Fibrillation  Time Out: Verified patient identification, verified procedure,medications/allergies/relevent history reviewed, required imaging and test results available.  Performed  Procedure Details  The patient was NPO after midnight. Anesthesia was administered at the beside  by Dr.Green with propofol.  Cardioversion was performed with synchronized biphasic defibrillation via AP pads with 120 joules.  1 attempt(s) were performed.  The patient converted to normal sinus rhythm. The patient tolerated the procedure well   IMPRESSION:  Successful cardioversion of atrial fibrillation    Candee Furbish 01/29/2017, 1:19 PM

## 2017-01-29 NOTE — Anesthesia Postprocedure Evaluation (Signed)
Anesthesia Post Note  Patient: Kayla Chambers  Procedure(s) Performed: Procedure(s) (LRB): CARDIOVERSION (N/A)  Patient location during evaluation: PACU Anesthesia Type: General Level of consciousness: awake and alert Pain management: pain level controlled Vital Signs Assessment: post-procedure vital signs reviewed and stable Respiratory status: spontaneous breathing, nonlabored ventilation and respiratory function stable Cardiovascular status: blood pressure returned to baseline and stable Postop Assessment: no signs of nausea or vomiting Anesthetic complications: no       Last Vitals:  Vitals:   01/29/17 1340 01/29/17 1350  BP: (!) 146/82 (!) 151/86  Pulse: (!) 59 63  Resp: 20 (!) 21  Temp:      Last Pain:  Vitals:   01/29/17 1319  TempSrc: Oral                 Wade Asebedo,W. EDMOND

## 2017-01-29 NOTE — Transfer of Care (Signed)
Immediate Anesthesia Transfer of Care Note  Patient: Kayla Chambers  Procedure(s) Performed: Procedure(s): CARDIOVERSION (N/A)  Patient Location: Endoscopy Unit  Anesthesia Type:General  Level of Consciousness: awake, alert , oriented and patient cooperative  Airway & Oxygen Therapy: Patient Spontanous Breathing and Patient connected to nasal cannula oxygen  Post-op Assessment: Report given to RN and Post -op Vital signs reviewed and stable  Post vital signs: Reviewed and stable  Last Vitals:  Vitals:   01/29/17 1151  BP: (!) 142/99  Pulse: 87  Resp: 14  Temp: 36.4 C    Last Pain:  Vitals:   01/29/17 1151  TempSrc: Oral         Complications: No apparent anesthesia complications

## 2017-01-29 NOTE — Interval H&P Note (Signed)
History and Physical Interval Note:  01/29/2017 1:03 PM  Kayla Chambers  has presented today for surgery, with the diagnosis of AFIB  The various methods of treatment have been discussed with the patient and family. After consideration of risks, benefits and other options for treatment, the patient has consented to  Procedure(s): CARDIOVERSION (N/A) as a surgical intervention .  The patient's history has been reviewed, patient examined, no change in status, stable for surgery.  I have reviewed the patient's chart and labs.  Questions were answered to the patient's satisfaction.     [employer]

## 2017-01-29 NOTE — Anesthesia Preprocedure Evaluation (Addendum)
Anesthesia Evaluation  Patient identified by MRN, date of birth, ID band Patient awake    Reviewed: Allergy & Precautions, H&P , NPO status , Patient's Chart, lab work & pertinent test results, reviewed documented beta blocker date and time   Airway Mallampati: II  TM Distance: >3 FB Neck ROM: Full    Dental no notable dental hx. (+) Teeth Intact, Dental Advisory Given   Pulmonary neg pulmonary ROS,    Pulmonary exam normal breath sounds clear to auscultation       Cardiovascular hypertension, Pt. on medications and Pt. on home beta blockers + CAD, + Peripheral Vascular Disease and +CHF   Rhythm:Irregular Rate:Normal     Neuro/Psych  Headaches, Anxiety negative psych ROS   GI/Hepatic negative GI ROS, Neg liver ROS,   Endo/Other  Hypothyroidism   Renal/GU negative Renal ROS  negative genitourinary   Musculoskeletal  (+) Fibromyalgia -, narcotic dependent  Abdominal   Peds  Hematology negative hematology ROS (+)   Anesthesia Other Findings   Reproductive/Obstetrics negative OB ROS                            Anesthesia Physical Anesthesia Plan  ASA: III  Anesthesia Plan: General   Post-op Pain Management:    Induction: Intravenous  Airway Management Planned: Mask  Additional Equipment:   Intra-op Plan:   Post-operative Plan:   Informed Consent: I have reviewed the patients History and Physical, chart, labs and discussed the procedure including the risks, benefits and alternatives for the proposed anesthesia with the patient or authorized representative who has indicated his/her understanding and acceptance.   Dental advisory given  Plan Discussed with: CRNA  Anesthesia Plan Comments:         Anesthesia Quick Evaluation

## 2017-01-29 NOTE — H&P (View-Only) (Signed)
HPI: Follow-up CAD status post coronary artery bypass graft, Bentall/AVR and maze procedure. She underwent a Bentall procedure by Dr. Servando Snare on 03/31/13. 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 Preoperatively she had been in and out of atrial fibrillation. More recently she has been maintaining normal sinus rhythm.Last echocardiogram June 2015 showed normal LV systolic function, biatrial enlargement, mean gradient across aortic valve of 7 mmHg. Carotid Dopplers March 2017 showed 1-39% bilateral stenosis. Seen recently and noted to be in recurrent atrial fibrillation; placed on apixaban (xarelto DCed previously due to bruising). Since last seen, she has had a recent flulike symptoms and upper respiratory infection. Since that time she notes increased dyspnea, fatigue, mild pedal edema and some palpitations. No chest pain.  Current Outpatient Prescriptions  Medication Sig Dispense Refill  . ALPRAZolam (XANAX) 0.5 MG tablet Take 0.5 mg by mouth at bedtime as needed for anxiety (for sleep).    Marland Kitchen apixaban (ELIQUIS) 5 MG TABS tablet Take 1 tablet (5 mg total) by mouth 2 (two) times daily. 60 tablet 1  . Ascorbic Acid (VITAMIN C PO) Take 1 tablet by mouth daily.    Marland Kitchen b complex vitamins tablet Take 1 tablet by mouth daily.    . Calcium Carbonate-Vitamin D (CALCIUM + D PO) Take 1 tablet by mouth daily.     Marland Kitchen CRANBERRY PO Take 1 tablet by mouth 2 (two) times daily.     . furosemide (LASIX) 20 MG tablet One tablet every other day as needed for swelling. 15 tablet 6  . lactase (LACTAID) 3000 UNITS tablet Take 1 tablet by mouth daily as needed (only when eating dairy).    Marland Kitchen levothyroxine (SYNTHROID, LEVOTHROID) 75 MCG tablet Take 75 mcg by mouth daily.      . Loperamide HCl (LOPERAMIDE A-D PO) Take 1 tablet by mouth 4 (four) times daily as needed (diarrhea).     .  metoprolol (LOPRESSOR) 50 MG tablet Take 0.5 tablets (25 mg total) by mouth 2 (two) times daily. 90 tablet 3  . Multiple Vitamin (MULTIVITAMIN WITH MINERALS) TABS Take 1 tablet by mouth daily.    . pravastatin (PRAVACHOL) 40 MG tablet Take 1 tablet (40 mg total) by mouth every evening. 90 tablet 3  . ROPINIROLE HCL PO Take 1 tablet by mouth daily as needed.     No current facility-administered medications for this visit.      Past Medical History:  Diagnosis Date  . Anxiety   . Aortic insufficiency    a. 03/2013 s/p  AVR w/ biologic 80mm Magna Ease peric tissue valve, model 300TFX, ser# [id number].   Marland Kitchen CAD (coronary artery disease)    a. 01/2013 mod calcification w/o sev dzs;  b. 03/2013 CABG x1 VG->RCA @ time of AVR  . Chronic diastolic CHF (congestive heart failure) ([place])    a. 01/2013 echo: EF 50-55%.  . Dilated aortic root ([place])    a. 03/2013 s/p Bentall procedure with Ao Root replacement (19mm Valsalva graft) w/ reimplantationof the R and L coronary ostium.  . Fibromyalgia   . Headache(784.0)    hx migraines  . Hypothyroidism   . Intracranial aneurysm    in late 1970's  . 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 Neoadjuvant RT by Dr. Yisroel Ramming. Surveillance plan: MRI 12/08/11 shows  changes in presumed post-op seroma/hematoma with slight increase in size - follow up MRI 11/18/12 showed same size. Will continue with q3 month MRI. CT C/A/P annually alternating with CXR at 6 months.   . Osteoporosis   . Paroxysmal a-fib ([place])    a. Dx 01/2013 - placed on xarelto;  b. 03/2013 s/p left sided MAZE and LA clip @ time of AVR/CABG.  . Peripheral neuropathy ([place])   . PMR (polymyalgia rheumatica) (HCC)    Inactive  . Rheumatic heart disease   . 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 Vertigo     Past Surgical History:  Procedure Laterality Date  . ABDOMINAL  HYSTERECTOMY    . ASCENDING AORTIC ROOT REPLACEMENT N/A 03/31/2013   Procedure: ASCENDING AORTIC ROOT REPLACEMENT;  Surgeon: Grace Isaac, MD;  Location: [place];  Service: Open Heart Surgery;  Laterality: N/A;  . BLADDER SUSPENSION    . COLONOSCOPY  10/25/2011   Procedure: COLONOSCOPY;  Surgeon: Garlan Fair, MD;  [facility];  Service: Endoscopy;  Laterality: N/A;  . EYE SURGERY Bilateral    cataracts  . INTRAOPERATIVE TRANSESOPHAGEAL ECHOCARDIOGRAM N/A 03/31/2013   Procedure: INTRAOPERATIVE TRANSESOPHAGEAL ECHOCARDIOGRAM;  Surgeon: Grace Isaac, MD;  Location: [place];  Service: Open Heart Surgery;  Laterality: N/A;  . LEFT AND RIGHT HEART CATHETERIZATION WITH CORONARY ANGIOGRAM Right 02/02/2013   Procedure: LEFT AND RIGHT HEART CATHETERIZATION WITH CORONARY ANGIOGRAM;  Surgeon: Hillary Bow, MD;  Location: [facility] CATH LAB;  Service: Cardiovascular;  Laterality: Right;  Marland Kitchen MAZE N/A 03/31/2013   Procedure: MAZE;  Surgeon: Grace Isaac, MD;  Location: [place];  Service: Open Heart Surgery;  Laterality: N/A;  . Sarcoma resection  2011    Social History   Social History  . Marital status: Married    Spouse name: N/A  . Number of children: N/A  . Years of education: N/A   Occupational History  . Not on file.   Social History Main Topics  . Smoking status: Never Smoker  . Smokeless tobacco: Never Used  . Alcohol use No  . Drug use: No  . Sexual activity: Yes   Other Topics Concern  . Not on file   Social History Narrative  . No narrative on file    Family History  Problem Relation Age of Onset  . Heart disease Mother   . Stroke Mother   . Arthritis Father   . Cancer Father   . Anesthesia problems Brother   . Hypertension Sister     ROS: no fevers or chills, productive cough, hemoptysis, dysphasia, odynophagia, melena, hematochezia, dysuria, hematuria, rash, seizure activity, orthopnea, PND, pedal edema, claudication. Remaining systems are  negative.  Physical Exam: Well-developed well-nourished in no acute distress.  Skin is warm and dry.  HEENT is normal.  Neck is supple.  Chest is clear to auscultation with normal expansion.  Cardiovascular exam is irregular Abdominal exam nontender or distended. No masses palpated. Extremities show trace to 1+ edema. neuro grossly intact   A/P  1 Paroxysmal atrial fibrillation-patient recently noted to have recurrent atrial fibrillation and remains today. Continue apixaban. Check Hgb and renal function. Continue metoprolol for rate control. Long discussion today concerning options of rhythm control versus rate control. She is having mild increased dyspnea and occasional palpitations. She notes increased fatigue. However she feels this may be related to recent upper respiratory infection. We have decided to see her back in  4 weeks. If her symptoms persist likely pursue cardioversion. If not then rate control may be best option. Echocardiogram and TSH.  2 coronary artery disease-continue statin. No aspirin given need for anticoagulation.  3 hypertension-blood pressure controlled. Continue present medications.  4 status post aortic valve replacement with tissue valve-continue SBE prophylaxis.  5 hyperlipidemia-continue statin.  6 carotid artery stenosis-mild on most recent Dopplers.  [name], MD

## 2017-01-30 ENCOUNTER — Encounter (HOSPITAL_COMMUNITY): Payer: Self-pay | Admitting: Cardiology

## 2017-01-31 ENCOUNTER — Encounter: Payer: Self-pay | Admitting: Cardiology

## 2017-02-11 ENCOUNTER — Encounter: Payer: Self-pay | Admitting: Cardiology

## 2017-02-11 ENCOUNTER — Ambulatory Visit (INDEPENDENT_AMBULATORY_CARE_PROVIDER_SITE_OTHER): Payer: Medicare Other | Admitting: Cardiology

## 2017-02-11 VITALS — BP 124/80 | HR 61 | Ht 65.0 in | Wt 137.0 lb

## 2017-02-11 DIAGNOSIS — I4891 Unspecified atrial fibrillation: Secondary | ICD-10-CM

## 2017-02-11 DIAGNOSIS — E785 Hyperlipidemia, unspecified: Secondary | ICD-10-CM | POA: Diagnosis not present

## 2017-02-11 DIAGNOSIS — Z9889 Other specified postprocedural states: Secondary | ICD-10-CM

## 2017-02-11 DIAGNOSIS — I2510 Atherosclerotic heart disease of native coronary artery without angina pectoris: Secondary | ICD-10-CM

## 2017-02-11 DIAGNOSIS — I25810 Atherosclerosis of coronary artery bypass graft(s) without angina pectoris: Secondary | ICD-10-CM | POA: Diagnosis not present

## 2017-02-11 NOTE — Patient Instructions (Signed)
Your physician recommends that you schedule a follow-up appointment in: 3 MONTHS WITH DR CRENSHAW  If you need a refill on your cardiac medications before your next appointment, please call your pharmacy.   

## 2017-03-13 ENCOUNTER — Telehealth: Payer: Self-pay | Admitting: Cardiology

## 2017-03-13 MED ORDER — APIXABAN 5 MG PO TABS: 5.0000 mg | ORAL_TABLET | Freq: Two times a day (BID) | ORAL | 3 refills | Status: DC

## 2017-03-13 NOTE — Telephone Encounter (Signed)
°*  STAT* If patient is at the pharmacy, call can be transferred to refill team.   1. Which medications need to be refilled? (please list name of each medication and dose if known) Eliquis 2. Which pharmacy/location (including street and city if local pharmacy) is medication to be sent to?[facility]  3. Do they need a 30 day or 90 day supply? 180 and refills

## 2017-03-13 NOTE — Telephone Encounter (Signed)
Refill sent to the pharmacy electronically.  

## 2017-03-15 DIAGNOSIS — N390 Urinary tract infection, site not specified: Secondary | ICD-10-CM | POA: Diagnosis not present

## 2017-03-15 DIAGNOSIS — E441 Mild protein-calorie malnutrition: Secondary | ICD-10-CM | POA: Diagnosis not present

## 2017-03-15 DIAGNOSIS — M353 Polymyalgia rheumatica: Secondary | ICD-10-CM | POA: Diagnosis not present

## 2017-03-15 DIAGNOSIS — I10 Essential (primary) hypertension: Secondary | ICD-10-CM | POA: Diagnosis not present

## 2017-03-15 DIAGNOSIS — E039 Hypothyroidism, unspecified: Secondary | ICD-10-CM | POA: Diagnosis not present

## 2017-03-15 DIAGNOSIS — I480 Paroxysmal atrial fibrillation: Secondary | ICD-10-CM | POA: Diagnosis not present

## 2017-03-15 DIAGNOSIS — I5032 Chronic diastolic (congestive) heart failure: Secondary | ICD-10-CM | POA: Diagnosis not present

## 2017-04-09 ENCOUNTER — Other Ambulatory Visit: Payer: Self-pay | Admitting: Cardiology

## 2017-04-09 NOTE — Telephone Encounter (Signed)
Rx request sent to pharmacy.  

## 2017-04-16 ENCOUNTER — Other Ambulatory Visit: Payer: Self-pay | Admitting: Cardiology

## 2017-04-16 DIAGNOSIS — E785 Hyperlipidemia, unspecified: Secondary | ICD-10-CM

## 2017-05-13 ENCOUNTER — Telehealth: Payer: Self-pay | Admitting: Cardiology

## 2017-05-13 DIAGNOSIS — R319 Hematuria, unspecified: Secondary | ICD-10-CM | POA: Diagnosis not present

## 2017-05-13 DIAGNOSIS — N390 Urinary tract infection, site not specified: Secondary | ICD-10-CM | POA: Diagnosis not present

## 2017-05-13 NOTE — Telephone Encounter (Signed)
Spoke with the patient's husband per the DPR. He stated that for the last couple of weeks the patient's urine has been tinged with blood. She denies any other symptoms. They would like to know if they can stop the Eliquis. She has been advised to keep taking the Eliquis since she has a history of Afib. Per pharmd, they should go to their PCP and have a urinalysis done. He verbalized his understanding and was reminded of her appointment on Thursday with Dr. Stanford Breed.

## 2017-05-13 NOTE — Progress Notes (Signed)
HPI: Follow-up CADstatus post coronary artery bypass graft, Bentall/AVR and maze procedure. She underwent a Bentall procedure by Dr. Servando Snare on 03/31/13. 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 Preoperatively she had been in and out of atrial fibrillation. Carotid Dopplers March 2017 showed 1-39% bilateral stenosis. Echo 3/18 showed normal LV function; s/p AVR with normal gradients and no AI; mild MR; biatrial enlargement; moderate RVE; severe TR. Has had recurrent atrial fibrillation. Had cardioversion on 01/29/2017. Since last seen, she has occasional dyspnea on exertion but no orthopnea or PND. Occasional mild pedal edema. No chest pain or syncope. Chronic dizziness. She has had hematuria which is being evaluated by primary care.   Current Outpatient Prescriptions  Medication Sig Dispense Refill  . ALPRAZolam (XANAX) 0.5 MG tablet Take 0.5 mg by mouth at bedtime as needed for anxiety (for sleep).    Marland Kitchen apixaban (ELIQUIS) 5 MG TABS tablet Take 1 tablet (5 mg total) by mouth 2 (two) times daily. 180 tablet 3  . Ascorbic Acid (VITAMIN C PO) Take 1 tablet by mouth daily.    Marland Kitchen b complex vitamins tablet Take 1 tablet by mouth daily.    . Calcium Carbonate-Vitamin D (CALCIUM + D PO) Take 1 tablet by mouth daily.     Marland Kitchen CRANBERRY PO Take 1 tablet by mouth 2 (two) times daily.     . furosemide (LASIX) 20 MG tablet One tablet every other day as needed for swelling. 15 tablet 6  . lactase (LACTAID) 3000 UNITS tablet Take 1 tablet by mouth daily as needed (only when eating dairy).    Marland Kitchen levothyroxine (SYNTHROID, LEVOTHROID) 75 MCG tablet Take 75 mcg by mouth daily.      . Loperamide HCl (LOPERAMIDE A-D PO) Take 1 tablet by mouth 4 (four) times daily as needed (diarrhea).     . metoprolol tartrate (LOPRESSOR) 50 MG tablet take 1/2 tablet by mouth twice a day 90 tablet 3  .  Multiple Vitamin (MULTIVITAMIN WITH MINERALS) TABS Take 1 tablet by mouth daily.    . pravastatin (PRAVACHOL) 40 MG tablet take 1 tablet by mouth every evening 90 tablet 3  . ROPINIROLE HCL PO Take 1 tablet by mouth daily as needed.    . sulfamethoxazole-trimethoprim (BACTRIM DS,SEPTRA DS) 800-160 MG tablet Take 1 tablet by mouth 2 (two) times daily. For 5 days  0   No current facility-administered medications for this visit.      Past Medical History:  Diagnosis Date  . Anxiety   . Aortic insufficiency    a. 03/2013 s/p  AVR w/ biologic 28mm Magna Ease peric tissue valve, model 300TFX, ser# [id number].   Marland Kitchen CAD (coronary artery disease)    a. 01/2013 mod calcification w/o sev dzs;  b. 03/2013 CABG x1 VG->RCA @ time of AVR  . Chronic diastolic CHF (congestive heart failure) ([place])    a. 01/2013 echo: EF 50-55%.  . Dilated aortic root ([place])    a. 03/2013 s/p Bentall procedure with Ao Root replacement (73mm Valsalva graft) w/ reimplantationof the R and L coronary ostium.  . Fibromyalgia   . Headache(784.0)    hx migraines  . Hypothyroidism   . Intracranial aneurysm    in late 1970's  . 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 Neoadjuvant RT by  Dr. Yisroel Ramming. Surveillance plan: MRI 12/08/11 shows changes in presumed post-op seroma/hematoma with slight increase in size - follow up MRI 11/18/12 showed same size. Will continue with q3 month MRI. CT C/A/P annually alternating with CXR at 6 months.   . Osteoporosis   . Paroxysmal A-fib ([place])    a. Dx 01/2013 - placed on xarelto;  b. 03/2013 s/p left sided MAZE and LA clip @ time of AVR/CABG.  . Peripheral neuropathy   . PMR (polymyalgia rheumatica) (HCC)    Inactive  . Rheumatic heart disease   . 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 Vertigo     Past Surgical History:  Procedure Laterality Date  . ABDOMINAL  HYSTERECTOMY    . ASCENDING AORTIC ROOT REPLACEMENT N/A 03/31/2013   Procedure: ASCENDING AORTIC ROOT REPLACEMENT;  Surgeon: Grace Isaac, MD;  Location: [place];  Service: Open Heart Surgery;  Laterality: N/A;  . BLADDER SUSPENSION    . CARDIOVERSION N/A 01/29/2017   Procedure: CARDIOVERSION;  Surgeon: Jerline Pain, MD;  Location: [place];  Service: Cardiovascular;  Laterality: N/A;  . COLONOSCOPY  10/25/2011   Procedure: COLONOSCOPY;  Surgeon: Garlan Fair, MD;  [facility];  Service: Endoscopy;  Laterality: N/A;  . EYE SURGERY Bilateral    cataracts  . INTRAOPERATIVE TRANSESOPHAGEAL ECHOCARDIOGRAM N/A 03/31/2013   Procedure: INTRAOPERATIVE TRANSESOPHAGEAL ECHOCARDIOGRAM;  Surgeon: Grace Isaac, MD;  Location: [place];  Service: Open Heart Surgery;  Laterality: N/A;  . LEFT AND RIGHT HEART CATHETERIZATION WITH CORONARY ANGIOGRAM Right 02/02/2013   Procedure: LEFT AND RIGHT HEART CATHETERIZATION WITH CORONARY ANGIOGRAM;  Surgeon: Hillary Bow, MD;  Location: [facility] CATH LAB;  Service: Cardiovascular;  Laterality: Right;  Marland Kitchen MAZE N/A 03/31/2013   Procedure: MAZE;  Surgeon: Grace Isaac, MD;  Location: [place];  Service: Open Heart Surgery;  Laterality: N/A;  . Sarcoma resection  2011    Social History   Social History  . Marital status: Married    Spouse name: N/A  . Number of children: N/A  . Years of education: N/A   Occupational History  . Not on file.   Social History Main Topics  . Smoking status: Never Smoker  . Smokeless tobacco: Never Used  . Alcohol use No  . Drug use: No  . Sexual activity: Yes   Other Topics Concern  . Not on file   Social History Narrative  . No narrative on file    Family History  Problem Relation Age of Onset  . Heart disease Mother   . Stroke Mother   . Arthritis Father   . Cancer Father   . Anesthesia problems Brother   . Hypertension Sister     ROS: Recent hematuria but no fevers or chills, productive  cough, hemoptysis, dysphasia, odynophagia, melena, hematochezia, dysuria, rash, seizure activity, orthopnea, PND, pedal edema, claudication. Remaining systems are negative.  Physical Exam: Well-developed well-nourished in no acute distress.  Skin is warm and dry.  HEENT is normal.  Neck is supple.  Chest is clear to auscultation with normal expansion.  Cardiovascular exam is regular rate and rhythm. 2/6 systolic murmur LSB Abdominal exam nontender or distended. No masses palpated. Extremities show no edema. neuro grossly intact   A/P  1 paroxysmal atrial fibrillation-patient remains in sinus rhythm on examination. Continue metoprolol for rate control if atrial fibrillation recurs. Continue apixaban. Her dose is borderline. She is 83 years old and weighs  60.8 kg with normal creatinine. We will continue 5 mg twice a day for now. If her hematuria becomes an issue we will decrease to 2.5 mg twice a day. Check hemoglobin and renal function.   2 coronary artery disease-continue statin. Patient is not on aspirin given need for anticoagulation.  3 hypertension-blood pressure controlled. Continue present medications.  4 hyperlipidemia-continue statin. Check lipids and liver.  5 status post aortic valve replacement-continue SBE prophylaxis.  6 carotid artery disease-mild on most recent Dopplers.  7 tricuspid regurgitation-we plan conservative measures given her age.  [name], MD

## 2017-05-13 NOTE — Telephone Encounter (Signed)
New message    Pt husband is calling asking for a call back. They have some questions about pt medications.

## 2017-05-16 ENCOUNTER — Encounter: Payer: Self-pay | Admitting: Cardiology

## 2017-05-16 ENCOUNTER — Ambulatory Visit (INDEPENDENT_AMBULATORY_CARE_PROVIDER_SITE_OTHER): Payer: Medicare Other | Admitting: Cardiology

## 2017-05-16 VITALS — BP 110/70 | HR 56 | Ht 65.0 in | Wt 134.0 lb

## 2017-05-16 DIAGNOSIS — I25810 Atherosclerosis of coronary artery bypass graft(s) without angina pectoris: Secondary | ICD-10-CM | POA: Diagnosis not present

## 2017-05-16 DIAGNOSIS — Z952 Presence of prosthetic heart valve: Secondary | ICD-10-CM

## 2017-05-16 DIAGNOSIS — I480 Paroxysmal atrial fibrillation: Secondary | ICD-10-CM

## 2017-05-16 NOTE — Patient Instructions (Signed)
Medication Instructions:   NO CHANGE  Labwork:  Your physician recommends that you HAVE LAB WORK TODAY  Follow-Up:  Your physician recommends that you schedule a follow-up appointment in: 6 MONTHS WITH DR CRENSHAW   If you need a refill on your cardiac medications before your next appointment, please call your pharmacy.    

## 2017-05-17 LAB — CBC
HEMATOCRIT: 40.5 % (ref 34.0–46.6)
Hemoglobin: 13.4 g/dL (ref 11.1–15.9)
MCH: 29.8 pg (ref 26.6–33.0)
MCHC: 33.1 g/dL (ref 31.5–35.7)
MCV: 90 fL (ref 79–97)
PLATELETS: 179 10*3/uL (ref 150–379)
RBC: 4.49 x10E6/uL (ref 3.77–5.28)
RDW: 15.5 % — ABNORMAL HIGH (ref 12.3–15.4)
WBC: 5.7 10*3/uL (ref 3.4–10.8)

## 2017-05-17 LAB — BASIC METABOLIC PANEL
BUN/Creatinine Ratio: 15 (ref 12–28)
BUN: 12 mg/dL (ref 8–27)
CHLORIDE: 101 mmol/L (ref 96–106)
CO2: 23 mmol/L (ref 20–29)
Calcium: 9.1 mg/dL (ref 8.7–10.3)
Creatinine, Ser: 0.8 mg/dL (ref 0.57–1.00)
GFR calc Af Amer: 77 mL/min/{1.73_m2} (ref >59)
GFR calc non Af Amer: 67 mL/min/{1.73_m2} (ref >59)
Glucose: 79 mg/dL (ref 65–99)
Potassium: 5.1 mmol/L (ref 3.5–5.2)
Sodium: 139 mmol/L (ref 134–144)

## 2017-05-17 LAB — LIPID PANEL
Chol/HDL Ratio: 2.7 ratio (ref 0.0–4.4)
Cholesterol, Total: 154 mg/dL (ref 100–199)
HDL: 57 mg/dL (ref >39)
LDL Calculated: 78 mg/dL (ref 0–99)
Triglycerides: 97 mg/dL (ref 0–149)
VLDL CHOLESTEROL CAL: 19 mg/dL (ref 5–40)

## 2017-05-17 LAB — HEPATIC FUNCTION PANEL
ALK PHOS: 73 IU/L (ref 39–117)
ALT: 11 IU/L (ref 0–32)
AST: 20 IU/L (ref 0–40)
Albumin: 4.2 g/dL (ref 3.5–4.7)
Bilirubin Total: 0.3 mg/dL (ref 0.0–1.2)
Bilirubin, Direct: 0.1 mg/dL (ref 0.00–0.40)
TOTAL PROTEIN: 6.6 g/dL (ref 6.0–8.5)

## 2017-05-20 ENCOUNTER — Encounter: Payer: Self-pay | Admitting: *Deleted

## 2017-05-27 DIAGNOSIS — R319 Hematuria, unspecified: Secondary | ICD-10-CM | POA: Diagnosis not present

## 2017-06-12 ENCOUNTER — Telehealth: Payer: Self-pay | Admitting: Cardiology

## 2017-06-12 MED ORDER — APIXABAN 5 MG PO TABS: 5.0000 mg | ORAL_TABLET | Freq: Two times a day (BID) | ORAL | 3 refills | Status: DC

## 2017-06-12 NOTE — Telephone Encounter (Signed)
LMTCB  Notes recorded by Cristopher Estimable, RN on 05/20/2017 at 8:41 AM EDT Letter of results sent to pt ------  Notes recorded by Lelon Perla, MD on 05/17/2017 at 7:16 AM EDT No change in meds Kayla Chambers  Per 05/16/17 note: 1 paroxysmal atrial fibrillation-patient remains in sinus rhythm on examination. Continue metoprolol for rate control if atrial fibrillation recurs. Continue apixaban. Her dose is borderline. She is 83 years old and weighs 60.8 kg with normal creatinine. We will continue 5 mg twice a day for now. If her hematuria becomes an issue we will decrease to 2.5 mg twice a day. Check hemoglobin and renal function

## 2017-06-12 NOTE — Telephone Encounter (Signed)
Returned call to [place].  Explained MD note and lab results from June 2018 -- as noted below Advised to continue same meds Rx(s) sent to pharmacy electronically.

## 2017-06-12 NOTE — Telephone Encounter (Signed)
Returning your call. °

## 2017-06-12 NOTE — Telephone Encounter (Signed)
New Message     Dr Stanford Breed was going to reduce her Eliquis after he got her lab work, he wants to speak to you before he gets her refill done

## 2017-06-24 DIAGNOSIS — K529 Noninfective gastroenteritis and colitis, unspecified: Secondary | ICD-10-CM | POA: Diagnosis not present

## 2017-06-24 DIAGNOSIS — E039 Hypothyroidism, unspecified: Secondary | ICD-10-CM | POA: Diagnosis not present

## 2017-06-24 DIAGNOSIS — M791 Myalgia: Secondary | ICD-10-CM | POA: Diagnosis not present

## 2017-07-03 DIAGNOSIS — I480 Paroxysmal atrial fibrillation: Secondary | ICD-10-CM | POA: Diagnosis not present

## 2017-07-03 DIAGNOSIS — Z952 Presence of prosthetic heart valve: Secondary | ICD-10-CM | POA: Diagnosis not present

## 2017-07-03 DIAGNOSIS — K529 Noninfective gastroenteritis and colitis, unspecified: Secondary | ICD-10-CM | POA: Diagnosis not present

## 2017-07-04 DIAGNOSIS — K529 Noninfective gastroenteritis and colitis, unspecified: Secondary | ICD-10-CM | POA: Diagnosis not present

## 2017-08-19 DIAGNOSIS — Z952 Presence of prosthetic heart valve: Secondary | ICD-10-CM | POA: Diagnosis not present

## 2017-08-19 DIAGNOSIS — E039 Hypothyroidism, unspecified: Secondary | ICD-10-CM | POA: Diagnosis not present

## 2017-08-19 DIAGNOSIS — E7800 Pure hypercholesterolemia, unspecified: Secondary | ICD-10-CM | POA: Diagnosis not present

## 2017-08-19 DIAGNOSIS — M810 Age-related osteoporosis without current pathological fracture: Secondary | ICD-10-CM | POA: Diagnosis not present

## 2017-08-19 DIAGNOSIS — E441 Mild protein-calorie malnutrition: Secondary | ICD-10-CM | POA: Diagnosis not present

## 2017-08-19 DIAGNOSIS — I5032 Chronic diastolic (congestive) heart failure: Secondary | ICD-10-CM | POA: Diagnosis not present

## 2017-08-19 DIAGNOSIS — Z0000 Encounter for general adult medical examination without abnormal findings: Secondary | ICD-10-CM | POA: Diagnosis not present

## 2017-08-19 DIAGNOSIS — I480 Paroxysmal atrial fibrillation: Secondary | ICD-10-CM | POA: Diagnosis not present

## 2017-08-19 DIAGNOSIS — Z1389 Encounter for screening for other disorder: Secondary | ICD-10-CM | POA: Diagnosis not present

## 2017-08-19 DIAGNOSIS — I10 Essential (primary) hypertension: Secondary | ICD-10-CM | POA: Diagnosis not present

## 2017-08-19 DIAGNOSIS — R413 Other amnesia: Secondary | ICD-10-CM | POA: Diagnosis not present

## 2017-08-19 DIAGNOSIS — R600 Localized edema: Secondary | ICD-10-CM | POA: Diagnosis not present

## 2017-08-22 ENCOUNTER — Telehealth: Payer: Self-pay | Admitting: Physician Assistant

## 2017-08-22 ENCOUNTER — Telehealth: Payer: Self-pay | Admitting: Cardiology

## 2017-08-22 NOTE — Telephone Encounter (Signed)
Spoke with patient of Dr. Stanford Breed who has h/o PAF, CABG, AVR, Maze, Bentall Patient is complaining of chest pain (husband gave her a few ASA), nausea, weakness. Denies SOB. Patient woke up with symptoms around 3am. Husband states he spoke with on-call APP (recommendations copied below). Advised that with acute symptoms and coronary history, patient should seek ED eval at [facility]. Husband + daughter will transport patient to hospital for evaluation.

## 2017-08-22 NOTE — Telephone Encounter (Signed)
Reached out to patient's husband as patient did not go to hospital. He gave his wife aspirin and she felt better and was able to eat some. He kept good watch on her BP and HR today. Offered PA OV to follow up and he declined, stating he will keep an eye on things for a few days. Patient does not see MD until 11/29. Routed to MD/RN as Juluis Rainier

## 2017-08-22 NOTE — Telephone Encounter (Signed)
Paged by answering service, patient work up around 4 AM with nausea then had one small vomiting. Non bloody. Later had chest pain with SOB. No radiation. She did not had any pain during CABG. During my conversation her chest pain improved. Unable to give number. Feels irregular heart beat without palpitation. Like prior episode of afib. BP 138/80 with pulse of 92. She will go to ER if no improvement of symptoms otherwise call office to make appointment today. Husband and the patient agree with plan.

## 2017-08-22 NOTE — Telephone Encounter (Signed)
Please call asap,think pt is in Atrial Fib,BP is high for her 138/92 and pulse is 92.

## 2017-09-25 ENCOUNTER — Other Ambulatory Visit: Payer: Self-pay | Admitting: Cardiology

## 2017-09-25 DIAGNOSIS — R601 Generalized edema: Secondary | ICD-10-CM

## 2017-09-25 NOTE — Telephone Encounter (Signed)
Rx has been sent to the pharmacy electronically. ° °

## 2017-10-07 ENCOUNTER — Encounter: Payer: Self-pay | Admitting: Cardiology

## 2017-10-08 NOTE — Progress Notes (Signed)
HPI: Follow-up CADstatus post coronary artery bypass graft, Bentall/AVR and maze procedure. She underwent a Bentall procedure by Dr. Servando Snare on 03/31/13. 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 Preoperatively she had been in and out of atrial fibrillation. Carotid Dopplers March 2017 showed 1-39% bilateral stenosis. Echo 3/18 showed normal LV function; s/p AVR with normal gradients and no AI; mild MR; biatrial enlargement; moderate RVE; severe TR. Has had recurrent atrial fibrillation. Had cardioversion on 01/29/2017. Since last seen, patient notes mild increased dyspnea on exertion. She has orthopnea which is new. She has gained approximately 10-15 pounds in the last several weeks.She has developed increased bilateral lower extremity edema left greater than right. She has not had chest pain. No syncope.  Current Outpatient Medications  Medication Sig Dispense Refill  . ALPRAZolam (XANAX) 0.5 MG tablet Take 0.5 mg by mouth at bedtime as needed for anxiety (for sleep).    Marland Kitchen apixaban (ELIQUIS) 5 MG TABS tablet Take 1 tablet (5 mg total) by mouth 2 (two) times daily. 180 tablet 3  . Ascorbic Acid (VITAMIN C PO) Take 1 tablet by mouth daily.    Marland Kitchen b complex vitamins tablet Take 1 tablet by mouth daily.    . Calcium Carbonate-Vitamin D (CALCIUM + D PO) Take 1 tablet by mouth daily.     Marland Kitchen CRANBERRY PO Take 1 tablet by mouth 2 (two) times daily.     . furosemide (LASIX) 20 MG tablet take 1 tablet by mouth every other day if needed for SWELLING 30 tablet 6  . lactase (LACTAID) 3000 UNITS tablet Take 1 tablet by mouth daily as needed (only when eating dairy).    Marland Kitchen levothyroxine (SYNTHROID, LEVOTHROID) 75 MCG tablet Take 75 mcg by mouth daily.      . Loperamide HCl (LOPERAMIDE A-D PO) Take 1 tablet by mouth 4 (four) times daily as needed (diarrhea).     . metoprolol  tartrate (LOPRESSOR) 50 MG tablet take 1/2 tablet by mouth twice a day 90 tablet 3  . Multiple Vitamin (MULTIVITAMIN WITH MINERALS) TABS Take 1 tablet by mouth daily.    . pravastatin (PRAVACHOL) 40 MG tablet take 1 tablet by mouth every evening 90 tablet 3  . ROPINIROLE HCL PO Take 1 tablet by mouth daily as needed.    . sulfamethoxazole-trimethoprim (BACTRIM DS,SEPTRA DS) 800-160 MG tablet Take 1 tablet by mouth 2 (two) times daily. For 5 days  0   No current facility-administered medications for this visit.      Past Medical History:  Diagnosis Date  . Anxiety   . Aortic insufficiency    a. 03/2013 s/p  AVR w/ biologic 64mm Magna Ease peric tissue valve, model 300TFX, ser# [id number].   Marland Kitchen CAD (coronary artery disease)    a. 01/2013 mod calcification w/o sev dzs;  b. 03/2013 CABG x1 VG->RCA @ time of AVR  . Chronic diastolic CHF (congestive heart failure) ([place])    a. 01/2013 echo: EF 50-55%.  . Dilated aortic root ([place])    a. 03/2013 s/p Bentall procedure with Ao Root replacement (56mm Valsalva graft) w/ reimplantationof the R and L coronary ostium.  . Fibromyalgia   . Headache(784.0)    hx migraines  . Hypothyroidism   . Intracranial aneurysm    in late 1970's  . Malignant neoplasm of connective and soft tissue ([place]) 01/08/2012   Overview:  11 cm, low grade, resection with negative margins 12/30/09. Neoadjuvant RT by Dr. Yisroel Ramming. Surveillance plan: MRI 12/08/11 shows changes in presumed post-op seroma/hematoma with slight increase in size - follow up MRI 11/18/12 showed same size. Will continue with q3 month MRI. CT C/A/P annually alternating with CXR at 6 months.   . Osteoporosis   . Paroxysmal A-fib ([place])    a. Dx 01/2013 - placed on xarelto;  b. 03/2013 s/p left sided MAZE and LA clip @ time of AVR/CABG.  . Peripheral neuropathy   . PMR (polymyalgia rheumatica) (HCC)    Inactive  . Rheumatic heart disease   . 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 Vertigo     Past Surgical History:  Procedure Laterality Date  . ABDOMINAL HYSTERECTOMY    . ASCENDING AORTIC ROOT REPLACEMENT N/A 03/31/2013   Procedure: ASCENDING AORTIC ROOT REPLACEMENT;  Surgeon: Grace Isaac, MD;  Location: [place];  Service: Open Heart Surgery;  Laterality: N/A;  . BLADDER SUSPENSION    . CARDIOVERSION N/A 01/29/2017   Procedure: CARDIOVERSION;  Surgeon: Jerline Pain, MD;  Location: [place];  Service: Cardiovascular;  Laterality: N/A;  . COLONOSCOPY  10/25/2011   Procedure: COLONOSCOPY;  Surgeon: Garlan Fair, MD;  [facility];  Service: Endoscopy;  Laterality: N/A;  . EYE SURGERY Bilateral    cataracts  . INTRAOPERATIVE TRANSESOPHAGEAL ECHOCARDIOGRAM N/A 03/31/2013   Procedure: INTRAOPERATIVE TRANSESOPHAGEAL ECHOCARDIOGRAM;  Surgeon: Grace Isaac, MD;  Location: [place];  Service: Open Heart Surgery;  Laterality: N/A;  . LEFT AND RIGHT HEART CATHETERIZATION WITH CORONARY ANGIOGRAM Right 02/02/2013   Procedure: LEFT AND RIGHT HEART CATHETERIZATION WITH CORONARY ANGIOGRAM;  Surgeon: Hillary Bow, MD;  Location: [facility] CATH LAB;  Service: Cardiovascular;  Laterality: Right;  Marland Kitchen MAZE N/A 03/31/2013   Procedure: MAZE;  Surgeon: Grace Isaac, MD;  Location: [place];  Service: Open Heart Surgery;  Laterality: N/A;  . Sarcoma resection  2011    Social History   Socioeconomic History  . Marital status: Married    Spouse name: Not on file  . Number of children: Not on file  . Years of education: Not on file  . Highest education level: Not on file  Social Needs  . Financial resource strain: Not on file  . Food insecurity - worry: Not on file  . Food insecurity - inability: Not on file  . Transportation needs - medical: Not on file  . Transportation needs - non-medical: Not on file  Occupational History  . Not on file  Tobacco Use  . Smoking status: Never Smoker  . Smokeless tobacco: Never Used  Substance and Sexual Activity  .  Alcohol use: No  . Drug use: No  . Sexual activity: Yes  Other Topics Concern  . Not on file  Social History Narrative  . Not on file    Family History  Problem Relation Age of Onset  . Heart disease Mother   . Stroke Mother   . Arthritis Father   . Cancer Father   . Anesthesia problems Brother   . Hypertension Sister     ROS: no fevers or chills, productive cough, hemoptysis, dysphasia, odynophagia, melena, hematochezia, dysuria, hematuria, rash, seizure activity, claudication. Remaining systems are negative.  Physical Exam: Well-developed well-nourished in no acute distress.  Skin is warm and dry.  HEENT is normal.  Neck is supple. + JVD Chest is clear to  auscultation with normal expansion.  Cardiovascular exam is irregular. 2/6 systolic murmur; no DM Abdominal exam nontender or distended. No masses palpated. Extremities show 2+ edema. neuro grossly intact  Electrocardiogram shows atrial fibrillation at a rate of 77. Cannot rule out prior septal infarct. Nonspecific ST changes. A/P  1 paroxysmal atrial fibrillation-patient has developed recurrent atrial fibrillation. This is the likely explanation of her acute on chronic diastolic congestive heart failure.  Will add amiodarone 200 mg twice a day to help hold sinus rhythm. We will plan to proceed with cardioversion early next week. Hopefully as she holds sinus rhythm her CHF will improve. Note she has missed no doses of apixaban. Continue beta blockade for rate control. Hold metoprolol morning of cardioversion. Check hemoglobin and renal function in 1 week.  2 coronary artery disease-continue statin. She is not on aspirin given need for anticoagulation.  3 hypertension-blood pressure is controlled; continue present medications.  4 hyperlipidemia-continue statin.  5 status post aortic valve replacement-continue SBE prophylaxis. Repeat echocardiogram.  6 tricuspid regurgitation-given patient's age and multiple  comorbidities would plan conservative measures.  7 acute diastolic congestive heart failure-CHF is likely related to new onset atrial fibrillation. She has increased weight, dyspnea and edema. She is volume overloaded on examination. She was taking Lasix 20 mg every other day but is occasionally not doing this. I will increase Lasix to 40 mg twice a day for 2 days and then 40 mg daily thereafter. Add kdur 20 mEq daily. Check potassium and renal function in one week. Patient instructed on fluid restriction and low sodium diet. Continue to weigh daily. I will have her see one of our APPs next week and I will see back in 8 weeks. Repeat echocardiogram to assess LV systolic function and aortic valve.   [name], MD

## 2017-10-17 ENCOUNTER — Encounter: Payer: Self-pay | Admitting: Cardiology

## 2017-10-17 ENCOUNTER — Ambulatory Visit (INDEPENDENT_AMBULATORY_CARE_PROVIDER_SITE_OTHER): Payer: Medicare Other | Admitting: Cardiology

## 2017-10-17 ENCOUNTER — Other Ambulatory Visit: Payer: Self-pay | Admitting: *Deleted

## 2017-10-17 VITALS — BP 128/74 | HR 76 | Ht 65.0 in | Wt 142.0 lb

## 2017-10-17 DIAGNOSIS — I10 Essential (primary) hypertension: Secondary | ICD-10-CM | POA: Diagnosis not present

## 2017-10-17 DIAGNOSIS — E7800 Pure hypercholesterolemia, unspecified: Secondary | ICD-10-CM | POA: Diagnosis not present

## 2017-10-17 DIAGNOSIS — I25810 Atherosclerosis of coronary artery bypass graft(s) without angina pectoris: Secondary | ICD-10-CM

## 2017-10-17 DIAGNOSIS — R601 Generalized edema: Secondary | ICD-10-CM | POA: Diagnosis not present

## 2017-10-17 DIAGNOSIS — I480 Paroxysmal atrial fibrillation: Secondary | ICD-10-CM

## 2017-10-17 DIAGNOSIS — I2510 Atherosclerotic heart disease of native coronary artery without angina pectoris: Secondary | ICD-10-CM

## 2017-10-17 DIAGNOSIS — I5033 Acute on chronic diastolic (congestive) heart failure: Secondary | ICD-10-CM

## 2017-10-17 MED ORDER — AMIODARONE HCL 200 MG PO TABS: 200.0000 mg | ORAL_TABLET | Freq: Two times a day (BID) | ORAL | 3 refills | Status: DC

## 2017-10-17 MED ORDER — FUROSEMIDE 40 MG PO TABS: ORAL_TABLET | ORAL | 3 refills | Status: DC

## 2017-10-17 MED ORDER — POTASSIUM CHLORIDE CRYS ER 20 MEQ PO TBCR: 20.0000 meq | EXTENDED_RELEASE_TABLET | Freq: Every day | ORAL | 3 refills | Status: DC

## 2017-10-17 NOTE — Patient Instructions (Signed)
Medication Instructions:   INCREASE FUROSEMIDE TO 40 MG TWICE DAILY FOR THE NEXT 2 DAYS THEN DECREASE TO 1 TABLET ONCE DAILY  START POTASSIUM CHLORIDE 20 MEQ ONCE DAILY  START AMIODARONE 200 MG ONE TABLET TWICE DAILY  Labwork:  Your physician recommends that you HAVE LAB WORK TODAY  Testing/Procedures:  Your physician has recommended that you have a Cardioversion (DCCV). Electrical Cardioversion uses a jolt of electricity to your heart either through paddles or wired patches attached to your chest. This is a controlled, usually prescheduled, procedure. Defibrillation is done under light anesthesia in the hospital, and you usually go home the day of the procedure. This is done to get your heart back into a normal rhythm. You are not awake for the procedure. Please see the instruction sheet given to you today.   Your physician has requested that you have an echocardiogram. Echocardiography is a painless test that uses sound waves to create images of your heart. It provides your doctor with information about the size and shape of your heart and how well your heart's chambers and valves are working. This procedure takes approximately one hour. There are no restrictions for this procedure.   Follow-Up:  Your physician recommends that you schedule a follow-up appointment in: [place]  Your physician recommends that you schedule a follow-up appointment in: [place] are scheduled for a Cardioversion on Tuesday 10-22-17 with Dr. Stanford Breed.  Please arrive at the [facility] (Main Entrance A) at [facility]: [place], [place] [address] at 12:30 TO 1 PM   DIET: Nothing to eat or drink after midnight except a sip of water with medications (see medication instructions below)  Medication Instructions: Hold DO NOT TAKE METOPROLOL THE NIGHT BEFORE OR THE MORNING OF THE PROCEDURE Continue your anticoagulant: ELIQUIS You will need to  continue your anticoagulant after your procedure until you are told by your  Provider that it is safe to stop Bring your insurance cards. *Special Note: Every effort is made to have your procedure done on time. Occasionally there are emergencies that occur at the hospital that may cause delays. Please be patient if a delay does occur.

## 2017-10-18 ENCOUNTER — Other Ambulatory Visit: Payer: Self-pay

## 2017-10-18 ENCOUNTER — Ambulatory Visit (HOSPITAL_COMMUNITY): Payer: Medicare Other | Attending: Cardiology

## 2017-10-18 ENCOUNTER — Telehealth: Payer: Self-pay | Admitting: *Deleted

## 2017-10-18 DIAGNOSIS — R601 Generalized edema: Secondary | ICD-10-CM | POA: Insufficient documentation

## 2017-10-18 DIAGNOSIS — Z5181 Encounter for therapeutic drug level monitoring: Secondary | ICD-10-CM

## 2017-10-18 DIAGNOSIS — I340 Nonrheumatic mitral (valve) insufficiency: Secondary | ICD-10-CM | POA: Diagnosis not present

## 2017-10-18 DIAGNOSIS — I480 Paroxysmal atrial fibrillation: Secondary | ICD-10-CM | POA: Diagnosis not present

## 2017-10-18 LAB — BASIC METABOLIC PANEL
BUN/Creatinine Ratio: 18 (ref 12–28)
BUN: 13 mg/dL (ref 8–27)
CALCIUM: 9.3 mg/dL (ref 8.7–10.3)
CHLORIDE: 103 mmol/L (ref 96–106)
CO2: 25 mmol/L (ref 20–29)
Creatinine, Ser: 0.74 mg/dL (ref 0.57–1.00)
GFR calc Af Amer: 84 mL/min/{1.73_m2} (ref >59)
GFR calc non Af Amer: 73 mL/min/{1.73_m2} (ref >59)
GLUCOSE: 83 mg/dL (ref 65–99)
POTASSIUM: 4.9 mmol/L (ref 3.5–5.2)
Sodium: 143 mmol/L (ref 134–144)

## 2017-10-18 NOTE — Telephone Encounter (Signed)
Advised husband of labs and will recheck in 2 weeks

## 2017-10-18 NOTE — Telephone Encounter (Signed)
-----   Message from Lelon Perla, MD sent at 10/18/2017  7:04 AM EST ----- No change in meds [name]

## 2017-10-22 ENCOUNTER — Ambulatory Visit (HOSPITAL_COMMUNITY): Payer: Medicare Other | Admitting: Anesthesiology

## 2017-10-22 ENCOUNTER — Other Ambulatory Visit: Payer: Self-pay

## 2017-10-22 ENCOUNTER — Encounter (HOSPITAL_COMMUNITY): Payer: Self-pay | Admitting: *Deleted

## 2017-10-22 ENCOUNTER — Ambulatory Visit (HOSPITAL_COMMUNITY)
Admission: RE | Admit: 2017-10-22 | Discharge: 2017-10-22 | Disposition: A | Discharge status: Home / Self Care | Payer: Medicare Other | Source: Ambulatory Visit | Attending: Cardiology | Admitting: Cardiology

## 2017-10-22 ENCOUNTER — Encounter (HOSPITAL_COMMUNITY)
Admission: RE | Disposition: A | Discharge status: Home / Self Care | Payer: Self-pay | Source: Ambulatory Visit | Attending: Cardiology

## 2017-10-22 DIAGNOSIS — G629 Polyneuropathy, unspecified: Secondary | ICD-10-CM | POA: Insufficient documentation

## 2017-10-22 DIAGNOSIS — F419 Anxiety disorder, unspecified: Secondary | ICD-10-CM | POA: Insufficient documentation

## 2017-10-22 DIAGNOSIS — Z952 Presence of prosthetic heart valve: Secondary | ICD-10-CM | POA: Diagnosis not present

## 2017-10-22 DIAGNOSIS — M353 Polymyalgia rheumatica: Secondary | ICD-10-CM | POA: Insufficient documentation

## 2017-10-22 DIAGNOSIS — Z951 Presence of aortocoronary bypass graft: Secondary | ICD-10-CM | POA: Insufficient documentation

## 2017-10-22 DIAGNOSIS — I480 Paroxysmal atrial fibrillation: Secondary | ICD-10-CM | POA: Diagnosis not present

## 2017-10-22 DIAGNOSIS — I481 Persistent atrial fibrillation: Secondary | ICD-10-CM | POA: Insufficient documentation

## 2017-10-22 DIAGNOSIS — Z923 Personal history of irradiation: Secondary | ICD-10-CM | POA: Insufficient documentation

## 2017-10-22 DIAGNOSIS — I110 Hypertensive heart disease with heart failure: Secondary | ICD-10-CM | POA: Diagnosis not present

## 2017-10-22 DIAGNOSIS — I071 Rheumatic tricuspid insufficiency: Secondary | ICD-10-CM | POA: Insufficient documentation

## 2017-10-22 DIAGNOSIS — Z79899 Other long term (current) drug therapy: Secondary | ICD-10-CM | POA: Diagnosis not present

## 2017-10-22 DIAGNOSIS — M797 Fibromyalgia: Secondary | ICD-10-CM | POA: Insufficient documentation

## 2017-10-22 DIAGNOSIS — I4891 Unspecified atrial fibrillation: Secondary | ICD-10-CM | POA: Diagnosis not present

## 2017-10-22 DIAGNOSIS — E039 Hypothyroidism, unspecified: Secondary | ICD-10-CM | POA: Insufficient documentation

## 2017-10-22 DIAGNOSIS — E785 Hyperlipidemia, unspecified: Secondary | ICD-10-CM | POA: Diagnosis not present

## 2017-10-22 DIAGNOSIS — I5033 Acute on chronic diastolic (congestive) heart failure: Secondary | ICD-10-CM | POA: Diagnosis not present

## 2017-10-22 DIAGNOSIS — I2510 Atherosclerotic heart disease of native coronary artery without angina pectoris: Secondary | ICD-10-CM | POA: Diagnosis not present

## 2017-10-22 DIAGNOSIS — Z7901 Long term (current) use of anticoagulants: Secondary | ICD-10-CM | POA: Diagnosis not present

## 2017-10-22 DIAGNOSIS — Z85831 Personal history of malignant neoplasm of soft tissue: Secondary | ICD-10-CM | POA: Insufficient documentation

## 2017-10-22 HISTORY — PX: CARDIOVERSION: SHX1299

## 2017-10-22 SURGERY — CARDIOVERSION
Anesthesia: General

## 2017-10-22 MED ORDER — SODIUM CHLORIDE 0.9% FLUSH: 3.0000 mL | Freq: Two times a day (BID) | INTRAVENOUS | Status: DC

## 2017-10-22 MED ORDER — LIDOCAINE 2% (20 MG/ML) 5 ML SYRINGE
INTRAMUSCULAR | Status: DC | PRN
  Administered 2017-10-22: 50 mg via INTRAVENOUS

## 2017-10-22 MED ORDER — PROPOFOL 10 MG/ML IV BOLUS
INTRAVENOUS | Status: DC | PRN
  Administered 2017-10-22: 70 mg via INTRAVENOUS

## 2017-10-22 MED ORDER — SODIUM CHLORIDE 0.9 % IV SOLN
250.0000 mL | INTRAVENOUS | Status: DC
  Administered 2017-10-22: 250 mL via INTRAVENOUS

## 2017-10-22 MED ORDER — SODIUM CHLORIDE 0.9% FLUSH: 3.0000 mL | INTRAVENOUS | Status: DC | PRN

## 2017-10-22 NOTE — H&P (Signed)
Office Visit   10/17/2017 [facility] Wendy Poet, MD  Cardiology   Paroxysmal atrial fibrillation [facility]) +5 more  Dx   Follow-up   , Coronary Artery Disease ; Referred by Donald Prose, MD  Reason for Visit   Additional Documentation   Vitals:   BP 128/74   Pulse 76   Ht 5\' 5"  (1.651 m)   Wt 142 lb (64.4 kg)   BMI 23.63 kg/m   BSA 1.72 m      More Vitals   Flowsheets:   Anthropometrics,   MEWS Score     Encounter Info:   Billing Info,   History,   Allergies,   Detailed Report     All Notes   Procedures by Lelon Perla, MD at 10/17/2017 11:20 AM   Author: Lelon Perla, MD Author Type: Physician Filed: 10/18/2017 3:02 PM  Note Status: Signed Cosign: Cosign Not Required Encounter Date: 10/17/2017  Editor: Lucilla Lame B        Scan on 10/18/2017 3:02 PM by Lucilla Lame B: [place]  Progress Notes by Lelon Perla, MD at 10/17/2017 11:20 AM   Author: Lelon Perla, MD Author Type: Physician Filed: 10/17/2017 11:50 AM  Note Status: Signed Cosign: Cosign Not Required Encounter Date: 10/17/2017  Editor: Lelon Perla, MD (Physician)         HPI: Follow-up CADstatus post coronary artery bypass graft, Bentall/AVR and maze procedure. She underwent a Bentall procedure by Dr. Servando Snare on 03/31/13. 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 Preoperatively she had been in and out of atrial fibrillation. Carotid Dopplers March 2017 showed 1-39% bilateral stenosis. Echo 3/18 showed normal LV function; s/p AVR with normal gradients and no AI; mild MR; biatrial enlargement; moderate RVE; severe TR.Has had recurrent atrial fibrillation.Had cardioversion on 01/29/2017. Since last seen,patient notes mild increased dyspnea on exertion. She has orthopnea which is new. She has gained  approximately 10-15 pounds in the last several weeks.She has developed increased bilateral lower extremity edema left greater than right. She has not had chest pain. No syncope.        Current Outpatient Medications  Medication Sig Dispense Refill  . ALPRAZolam (XANAX) 0.5 MG tablet Take 0.5 mg by mouth at bedtime as needed for anxiety (for sleep).    Marland Kitchen apixaban (ELIQUIS) 5 MG TABS tablet Take 1 tablet (5 mg total) by mouth 2 (two) times daily. 180 tablet 3  . Ascorbic Acid (VITAMIN C PO) Take 1 tablet by mouth daily.    Marland Kitchen b complex vitamins tablet Take 1 tablet by mouth daily.    . Calcium Carbonate-Vitamin D (CALCIUM + D PO) Take 1 tablet by mouth daily.     Marland Kitchen CRANBERRY PO Take 1 tablet by mouth 2 (two) times daily.     . furosemide (LASIX) 20 MG tablet take 1 tablet by mouth every other day if needed for SWELLING 30 tablet 6  . lactase (LACTAID) 3000 UNITS tablet Take 1 tablet by mouth daily as needed (only when eating dairy).    Marland Kitchen levothyroxine (SYNTHROID, LEVOTHROID) 75 MCG tablet Take 75 mcg by mouth daily.      . Loperamide HCl (LOPERAMIDE A-D PO) Take 1 tablet by mouth 4 (four) times daily as needed (diarrhea).     . metoprolol tartrate (LOPRESSOR) 50 MG tablet take 1/2 tablet by mouth  twice a day 90 tablet 3  . Multiple Vitamin (MULTIVITAMIN WITH MINERALS) TABS Take 1 tablet by mouth daily.    . pravastatin (PRAVACHOL) 40 MG tablet take 1 tablet by mouth every evening 90 tablet 3  . ROPINIROLE HCL PO Take 1 tablet by mouth daily as needed.    . sulfamethoxazole-trimethoprim (BACTRIM DS,SEPTRA DS) 800-160 MG tablet Take 1 tablet by mouth 2 (two) times daily. For 5 days  0   No current facility-administered medications for this visit.          Past Medical History:  Diagnosis Date  . Anxiety   . Aortic insufficiency    a. 03/2013 s/p  AVR w/ biologic 33mm Magna Ease peric tissue valve, model 300TFX, ser# [id number].   Marland Kitchen CAD (coronary artery disease)      a. 01/2013 mod calcification w/o sev dzs;  b. 03/2013 CABG x1 VG->RCA @ time of AVR  . Chronic diastolic CHF (congestive heart failure) ([place])    a. 01/2013 echo: EF 50-55%.  . Dilated aortic root ([place])    a. 03/2013 s/p Bentall procedure with Ao Root replacement (22mm Valsalva graft) w/ reimplantationof the R and L coronary ostium.  . Fibromyalgia   . Headache(784.0)    hx migraines  . Hypothyroidism   . Intracranial aneurysm    in late 1970's  . 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 Neoadjuvant RT by Dr. Yisroel Ramming. Surveillance plan: MRI 12/08/11 shows changes in presumed post-op seroma/hematoma with slight increase in size - follow up MRI 11/18/12 showed same size. Will continue with q3 month MRI. CT C/A/P annually alternating with CXR at 6 months.   . Osteoporosis   . Paroxysmal A-fib ([place])    a. Dx 01/2013 - placed on xarelto;  b. 03/2013 s/p left sided MAZE and LA clip @ time of AVR/CABG.  . Peripheral neuropathy   . PMR (polymyalgia rheumatica) ([facility])    Inactive  . Rheumatic heart disease   . 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 Vertigo          Past Surgical History:  Procedure Laterality Date  . ABDOMINAL HYSTERECTOMY    . ASCENDING AORTIC ROOT REPLACEMENT N/A 03/31/2013   Procedure: ASCENDING AORTIC ROOT REPLACEMENT;  Surgeon: Grace Isaac, MD;  Location: [place];  Service: Open Heart Surgery;  Laterality: N/A;  . BLADDER SUSPENSION    . CARDIOVERSION N/A 01/29/2017   Procedure: CARDIOVERSION;  Surgeon: Jerline Pain, MD;  Location: [place];  Service: Cardiovascular;  Laterality: N/A;  . COLONOSCOPY  10/25/2011   Procedure: COLONOSCOPY;  Surgeon: Garlan Fair, MD;  Location: [facility];  Service: Endoscopy;  Laterality: N/A;  . EYE SURGERY Bilateral    cataracts  . INTRAOPERATIVE  TRANSESOPHAGEAL ECHOCARDIOGRAM N/A 03/31/2013   Procedure: INTRAOPERATIVE TRANSESOPHAGEAL ECHOCARDIOGRAM;  Surgeon: Grace Isaac, MD;  Location: [place];  Service: Open Heart Surgery;  Laterality: N/A;  . LEFT AND RIGHT HEART CATHETERIZATION WITH CORONARY ANGIOGRAM Right 02/02/2013   Procedure: LEFT AND RIGHT HEART CATHETERIZATION WITH CORONARY ANGIOGRAM;  Surgeon: Hillary Bow, MD;  Location: [facility];  Service: Cardiovascular;  Laterality: Right;  Marland Kitchen MAZE N/A 03/31/2013   Procedure: MAZE;  Surgeon: Grace Isaac, MD;  Location: [place];  Service: Open Heart Surgery;  Laterality: N/A;  . Sarcoma resection  2011  Social History        Socioeconomic History  . Marital status: Married    Spouse name: Not on file  . Number of children: Not on file  . Years of education: Not on file  . Highest education level: Not on file  Social Needs  . Financial resource strain: Not on file  . Food insecurity - worry: Not on file  . Food insecurity - inability: Not on file  . Transportation needs - medical: Not on file  . Transportation needs - non-medical: Not on file  Occupational History  . Not on file  Tobacco Use  . Smoking status: Never Smoker  . Smokeless tobacco: Never Used  Substance and Sexual Activity  . Alcohol use: No  . Drug use: No  . Sexual activity: Yes  Other Topics Concern  . Not on file  Social History Narrative  . Not on file         Family History  Problem Relation Age of Onset  . Heart disease Mother   . Stroke Mother   . Arthritis Father   . Cancer Father   . Anesthesia problems Brother   . Hypertension Sister     ROS: no fevers or chills, productive cough, hemoptysis, dysphasia, odynophagia, melena, hematochezia, dysuria, hematuria, rash, seizure activity, claudication. Remaining systems are negative.  Physical Exam: Well-developed well-nourished in no acute distress.  Skin is warm and dry.  HEENT is normal.  Neck is supple.  + JVD Chest is clear to auscultation with normal expansion.  Cardiovascular exam is irregular. 2/6 systolic murmur; no DM Abdominal exam nontender or distended. No masses palpated. Extremities show 2+ edema. neuro grossly intact  Electrocardiogram shows atrial fibrillation at a rate of 77. Cannot rule out prior septal infarct. Nonspecific ST changes. A/P  1 paroxysmal atrial fibrillation-patient has developed recurrent atrial fibrillation. This is the likely explanation of her acute on chronic diastolic congestive heart failure.  Will add amiodarone 200 mg twice a day to help hold sinus rhythm. We will plan to proceed with cardioversion early next week. Hopefully as she holds sinus rhythm her CHF will improve. Note she has missed no doses of apixaban. Continue beta blockade for rate control. Hold metoprolol morning of cardioversion. Check hemoglobin and renal function in 1 week.  2 coronary artery disease-continue statin. She is not on aspirin given need for anticoagulation.  3 hypertension-blood pressure is controlled; continue present medications.  4 hyperlipidemia-continue statin.  5 status post aortic valve replacement-continue SBE prophylaxis. Repeat echocardiogram.  6 tricuspid regurgitation-given patient's age and multiple comorbidities would plan conservative measures.  7 acute diastolic congestive heart failure-CHF is likely related to new onset atrial fibrillation. She has increased weight, dyspnea and edema. She is volume overloaded on examination. She was taking Lasix 20 mg every other day but is occasionally not doing this. I will increase Lasix to 40 mg twice a day for 2 days and then 40 mg daily thereafter. Add kdur 20 mEq daily. Check potassium and renal function in one week. Patient instructed on fluid restriction and low sodium diet. Continue to weigh daily. I will have her see one of our APPs next week and I will see back in 8 weeks. Repeat echocardiogram to assess LV  systolic function and aortic valve.   [name], MD     For DCCV; no changes. [name], MD

## 2017-10-22 NOTE — Anesthesia Preprocedure Evaluation (Addendum)
Anesthesia Evaluation  Patient identified by MRN, date of birth, ID band Patient awake    Reviewed: Allergy & Precautions, NPO status , Patient's Chart, lab work & pertinent test results  History of Anesthesia Complications Negative for: history of anesthetic complications  Airway Mallampati: II   Neck ROM: Full    Dental no notable dental hx.    Pulmonary neg pulmonary ROS,    breath sounds clear to auscultation       Cardiovascular hypertension, + CAD and +CHF  + dysrhythmias  Rhythm:Irregular Rate:Normal     Neuro/Psych  Neuromuscular disease    GI/Hepatic   Endo/Other  Hypothyroidism   Renal/GU      Musculoskeletal  (+) Fibromyalgia -  Abdominal   Peds  Hematology   Anesthesia Other Findings   Reproductive/Obstetrics                            Anesthesia Physical Anesthesia Plan  ASA: III  Anesthesia Plan: General   Post-op Pain Management:    Induction: Intravenous  PONV Risk Score and Plan: 3 and Treatment may vary due to age or medical condition  Airway Management Planned: Mask  Additional Equipment:   Intra-op Plan:   Post-operative Plan:   Informed Consent: I have reviewed the patients History and Physical, chart, labs and discussed the procedure including the risks, benefits and alternatives for the proposed anesthesia with the patient or authorized representative who has indicated his/her understanding and acceptance.   Dental advisory given  Plan Discussed with: CRNA  Anesthesia Plan Comments:         Anesthesia Quick Evaluation

## 2017-10-22 NOTE — Procedures (Signed)
Electrical Cardioversion Procedure Note ABIMBOLA AKI [id number] 1927-11-01  Procedure: Electrical Cardioversion Indications:  Atrial Fibrillation  Procedure Details Consent: Risks of procedure as well as the alternatives and risks of each were explained to the (patient/caregiver).  Consent for procedure obtained. Time Out: Verified patient identification, verified procedure, site/side was marked, verified correct patient position, special equipment/implants available, medications/allergies/relevent history reviewed, required imaging and test results available.  Performed  Patient placed on cardiac monitor, pulse oximetry, supplemental oxygen as necessary.  Sedation given: Pt sedated by anesthesia with lidocaine 50 mg and diprovan 70 mg IV. Pacer pads placed anterior and posterior chest.  Cardioverted 1 time(s).  Cardioverted at 120J.  Evaluation Findings: Post procedure EKG shows: NSR Complications: None Patient did tolerate procedure well.   [name] 10/22/2017, 12:33 PM

## 2017-10-22 NOTE — Anesthesia Procedure Notes (Signed)
Procedure Name: General with mask airway Date/Time: 10/22/2017 1:22 PM Performed by: Imagene Riches, CRNA Pre-anesthesia Checklist: Patient identified, Emergency Drugs available, Suction available, Patient being monitored and Timeout performed Patient Re-evaluated:Patient Re-evaluated prior to induction Oxygen Delivery Method: Ambu bag Preoxygenation: Pre-oxygenation with 100% oxygen Induction Type: IV induction

## 2017-10-22 NOTE — Transfer of Care (Signed)
Immediate Anesthesia Transfer of Care Note  Patient: Kayla Chambers  Procedure(s) Performed: CARDIOVERSION (N/A )  Patient Location: Endoscopy Unit  Anesthesia Type:General  Level of Consciousness: drowsy and patient cooperative  Airway & Oxygen Therapy: Patient Spontanous Breathing and Patient connected to face mask oxygen  Post-op Assessment: Report given to RN and Post -op Vital signs reviewed and stable  Post vital signs: Reviewed and stable  Last Vitals:  Vitals:   10/22/17 1250  BP: (!) 141/95  Pulse: (!) 102  Resp: 20  Temp: 36.4 C  SpO2: 97%    Last Pain:  Vitals:   10/22/17 1250  TempSrc: Oral         Complications: No apparent anesthesia complications

## 2017-10-22 NOTE — Discharge Instructions (Signed)
Electrical Cardioversion, Care After °This sheet gives you information about how to care for yourself after your procedure. Your health care provider may also give you more specific instructions. If you have problems or questions, contact your health care provider. °What can I expect after the procedure? °After the procedure, it is common to have: °· Some redness on the skin where the shocks were given. ° °Follow these instructions at home: °· Do not drive for 24 hours if you were given a medicine to help you relax (sedative). °· Take over-the-counter and prescription medicines only as told by your health care provider. °· Ask your health care provider how to check your pulse. Check it often. °· Rest for 48 hours after the procedure or as told by your health care provider. °· Avoid or limit your caffeine use as told by your health care provider. °Contact a health care provider if: °· You feel like your heart is beating too quickly or your pulse is not regular. °· You have a serious muscle cramp that does not go away. °Get help right away if: °· You have discomfort in your chest. °· You are dizzy or you feel faint. °· You have trouble breathing or you are short of breath. °· Your speech is slurred. °· You have trouble moving an arm or leg on one side of your body. °· Your fingers or toes turn cold or blue. °This information is not intended to replace advice given to you by your health care provider. Make sure you discuss any questions you have with your health care provider. °Document Released: 08/26/2013 Document Revised: 06/08/2016 Document Reviewed: 05/11/2016 °Elsevier Interactive Patient Education © 2018 [employer] ° °

## 2017-10-23 ENCOUNTER — Encounter (HOSPITAL_COMMUNITY): Payer: Self-pay | Admitting: Cardiology

## 2017-10-24 NOTE — Anesthesia Postprocedure Evaluation (Signed)
Anesthesia Post Note  Patient: Kayla Chambers  Procedure(s) Performed: CARDIOVERSION (N/A )     Patient location during evaluation: Endoscopy Anesthesia Type: General Level of consciousness: awake and alert Pain management: pain level controlled Vital Signs Assessment: post-procedure vital signs reviewed and stable Respiratory status: spontaneous breathing, nonlabored ventilation, respiratory function stable and patient connected to nasal cannula oxygen Cardiovascular status: blood pressure returned to baseline and stable Postop Assessment: no apparent nausea or vomiting Anesthetic complications: no    Last Vitals:  Vitals:   10/22/17 1345 10/22/17 1355  BP: 119/70 121/68  Pulse: 66 64  Resp: (!) 27 20  Temp:    SpO2: 98% 96%    Last Pain:  Vitals:   10/22/17 1330  TempSrc: Oral                 Vincente Asbridge,JAMES TERRILL

## 2017-10-29 ENCOUNTER — Ambulatory Visit: Payer: Medicare Other | Admitting: Physician Assistant

## 2017-11-01 ENCOUNTER — Ambulatory Visit (INDEPENDENT_AMBULATORY_CARE_PROVIDER_SITE_OTHER): Payer: Medicare Other | Admitting: Physician Assistant

## 2017-11-01 ENCOUNTER — Encounter: Payer: Self-pay | Admitting: Physician Assistant

## 2017-11-01 VITALS — BP 116/72 | HR 77 | Ht 65.0 in | Wt 142.4 lb

## 2017-11-01 DIAGNOSIS — Z7901 Long term (current) use of anticoagulants: Secondary | ICD-10-CM | POA: Diagnosis not present

## 2017-11-01 DIAGNOSIS — I5033 Acute on chronic diastolic (congestive) heart failure: Secondary | ICD-10-CM | POA: Diagnosis not present

## 2017-11-01 DIAGNOSIS — I480 Paroxysmal atrial fibrillation: Secondary | ICD-10-CM | POA: Diagnosis not present

## 2017-11-01 DIAGNOSIS — I25810 Atherosclerosis of coronary artery bypass graft(s) without angina pectoris: Secondary | ICD-10-CM

## 2017-11-01 DIAGNOSIS — Z5181 Encounter for therapeutic drug level monitoring: Secondary | ICD-10-CM | POA: Diagnosis not present

## 2017-11-01 LAB — BASIC METABOLIC PANEL
BUN/Creatinine Ratio: 21 (ref 12–28)
BUN: 16 mg/dL (ref 8–27)
CALCIUM: 8.9 mg/dL (ref 8.7–10.3)
CHLORIDE: 98 mmol/L (ref 96–106)
CO2: 27 mmol/L (ref 20–29)
Creatinine, Ser: 0.75 mg/dL (ref 0.57–1.00)
GFR calc Af Amer: 82 mL/min/{1.73_m2} (ref >59)
GFR calc non Af Amer: 71 mL/min/{1.73_m2} (ref >59)
GLUCOSE: 129 mg/dL — AB (ref 65–99)
POTASSIUM: 4.1 mmol/L (ref 3.5–5.2)
SODIUM: 138 mmol/L (ref 134–144)

## 2017-11-01 NOTE — Patient Instructions (Addendum)
Medication Instructions:  TAKE LASIX 40MG  TWICE DAILY FOR 3 DAYS-OR-UNTIL YOU ARE DOWN 5 POUNDS If you need a refill on your cardiac medications before your next appointment, please call your pharmacy.  Labwork: BMET TODAY HERE IN OUR OFFICE AT [facility]  Special Instructions: PLEASE TAKE MORE POTASSIUM  2,000 MG LOW SODIUM DIET MAY TAKE 500 PER MEAL  1 LITER DAILY FLUID-ALL FLUIDS  Follow-Up: Your physician wants you to follow-up in: 1-2 [place] PA-C.   Thank you for choosing [facility]!!    2,000 MG DAILY Low-Sodium Eating Plan Sodium, which is an element that makes up salt, helps you maintain a healthy balance of fluids in your body. Too much sodium can increase your blood pressure and cause fluid and waste to be held in your body. Your health care provider or dietitian may recommend following this plan if you have high blood pressure (hypertension), kidney disease, liver disease, or heart failure. Eating less sodium can help lower your blood pressure, reduce swelling, and protect your heart, liver, and kidneys. What are tips for following this plan? General guidelines  Most people on this plan should limit their sodium intake to 1,500-2,000 mg (milligrams) of sodium each day. Reading food labels  The Nutrition Facts label lists the amount of sodium in one serving of the food. If you eat more than one serving, you must multiply the listed amount of sodium by the number of servings.  Choose foods with less than 140 mg of sodium per serving.  Avoid foods with 300 mg of sodium or more per serving. Shopping  Look for lower-sodium products, often labeled as "low-sodium" or "no salt added."  Always check the sodium content even if foods are labeled as "unsalted" or "no salt added".  Buy fresh foods. ? Avoid canned foods and premade or frozen meals. ? Avoid canned, cured, or processed meats  Buy breads that have less than 80 mg of  sodium per slice. Cooking  Eat more home-cooked food and less restaurant, buffet, and fast food.  Avoid adding salt when cooking. Use salt-free seasonings or herbs instead of table salt or sea salt. Check with your health care provider or pharmacist before using salt substitutes.  Cook with plant-based oils, such as canola, sunflower, or olive oil. Meal planning  When eating at a restaurant, ask that your food be prepared with less salt or no salt, if possible.  Avoid foods that contain MSG (monosodium glutamate). MSG is sometimes added to [place] food, bouillon, and some canned foods. What foods are recommended? The items listed may not be a complete list. Talk with your dietitian about what dietary choices are best for you. Grains Low-sodium cereals, including oats, puffed wheat and rice, and shredded wheat. Low-sodium crackers. Unsalted rice. Unsalted pasta. Low-sodium bread. Whole-grain breads and whole-grain pasta. Vegetables Fresh or frozen vegetables. "No salt added" canned vegetables. "No salt added" tomato sauce and paste. Low-sodium or reduced-sodium tomato and vegetable juice. Fruits Fresh, frozen, or canned fruit. Fruit juice. Meats and other protein foods Fresh or frozen (no salt added) meat, poultry, seafood, and fish. Low-sodium canned tuna and salmon. Unsalted nuts. Dried peas, beans, and lentils without added salt. Unsalted canned beans. Eggs. Unsalted nut butters. Dairy Milk. Soy milk. Cheese that is naturally low in sodium, such as ricotta cheese, fresh mozzarella, or Swiss cheese Low-sodium or reduced-sodium cheese. Cream cheese. Yogurt. Fats and oils Unsalted butter. Unsalted margarine with no trans fat. Vegetable oils such as canola  or olive oils. Seasonings and other foods Fresh and dried herbs and spices. Salt-free seasonings. Low-sodium mustard and ketchup. Sodium-free salad dressing. Sodium-free light mayonnaise. Fresh or refrigerated horseradish. Lemon juice.  Vinegar. Homemade, reduced-sodium, or low-sodium soups. Unsalted popcorn and pretzels. Low-salt or salt-free chips. What foods are not recommended? The items listed may not be a complete list. Talk with your dietitian about what dietary choices are best for you. Grains Instant hot cereals. Bread stuffing, pancake, and biscuit mixes. Croutons. Seasoned rice or pasta mixes. Noodle soup cups. Boxed or frozen macaroni and cheese. Regular salted crackers. Self-rising flour. Vegetables Sauerkraut, pickled vegetables, and relishes. Olives. [place] fries. Onion rings. Regular canned vegetables (not low-sodium or reduced-sodium). Regular canned tomato sauce and paste (not low-sodium or reduced-sodium). Regular tomato and vegetable juice (not low-sodium or reduced-sodium). Frozen vegetables in sauces. Meats and other protein foods Meat or fish that is salted, canned, smoked, spiced, or pickled. Bacon, ham, sausage, hotdogs, corned beef, chipped beef, packaged lunch meats, salt pork, jerky, pickled herring, anchovies, regular canned tuna, sardines, salted nuts. Dairy Processed cheese and cheese spreads. Cheese curds. Blue cheese. Feta cheese. String cheese. Regular cottage cheese. Buttermilk. Canned milk. Fats and oils Salted butter. Regular margarine. Ghee. Bacon fat. Seasonings and other foods Onion salt, garlic salt, seasoned salt, table salt, and sea salt. Canned and packaged gravies. Worcestershire sauce. Tartar sauce. Barbecue sauce. Teriyaki sauce. Soy sauce, including reduced-sodium. Steak sauce. Fish sauce. Oyster sauce. Cocktail sauce. Horseradish that you find on the shelf. Regular ketchup and mustard. Meat flavorings and tenderizers. Bouillon cubes. Hot sauce and Tabasco sauce. Premade or packaged marinades. Premade or packaged taco seasonings. Relishes. Regular salad dressings. Salsa. Potato and tortilla chips. Corn chips and puffs. Salted popcorn and pretzels. Canned or dried soups. Pizza. Frozen  entrees and pot pies. Summary  Eating less sodium can help lower your blood pressure, reduce swelling, and protect your heart, liver, and kidneys.  Most people on this plan should limit their sodium intake to 1,500-2,000 mg (milligrams) of sodium each day.  Canned, boxed, and frozen foods are high in sodium. Restaurant foods, fast foods, and pizza are also very high in sodium. You also get sodium by adding salt to food.  Try to cook at home, eat more fresh fruits and vegetables, and eat less fast food, canned, processed, or prepared foods. This information is not intended to replace advice given to you by your health care provider. Make sure you discuss any questions you have with your health care provider. Document Released: 04/27/2002 Document Revised: 10/29/2016 Document Reviewed: 10/29/2016 Elsevier Interactive Patient Education  2017 Elsevier Inc.   Fluid Restriction Some health conditions may require you to restrict your fluid intake. This means that you need to limit the amount of fluid you drink each day. When you have a fluid restriction, you must carefully measure and keep track of the amount of fluid you drink. Your health care provider will identify the specific amount of fluid you are allowed each day. This amount may depend on several things, such as:  The amount of urine you produce in a day.  How much fluid you are keeping (retaining) in your body.  Your blood pressure.  What is my plan? Your health care provider recommends that you limit your fluid intake to _1 LITER_ per day. What counts toward my fluid intake? Your fluid intake includes all liquids that you drink, as well as any foods that become liquid at room temperature. The following are examples of  some fluids that you will have to restrict:  Tea, coffee, soda, lemonade, milk, water, juice, sport drinks, and nutritional supplement beverages.  Alcoholic beverages.  Cream.  Gravy.  Ice cubes.  Soup and  broth.  The following are examples of foods that become liquid at room temperature. These foods will also count toward your fluid intake.  Ice cream and ice milk.  Frozen yogurt and sherbet.  Frozen ice pops.  Flavored gelatin.  How do I keep track of my fluid intake? Each morning, fill a jug with the amount of water that equals the amount of fluid you are allowed for the day. You can use this water as a guideline for fluid allowance. Each time you take in any form of fluid, including ice cubes and foods that become liquid at room temperature, pour an equal amount of water out of the container. This helps you to see how much fluid you are taking in. It also helps you to see how much of your fluid intake is left for the rest of the day. The following conversions may also be helpful in measuring your fluid intake:  1 cup equals 8 oz (240 mL).   cup equals 6 oz (180 mL).  ? cup equals 5? oz (160 mL).   cup equals 4 oz (120 mL).  ? cup equals 2? oz (80 mL).   cup equals 2 oz (60 mL).  2 Tbsp equals 1 oz (30 mL).  What home care instructions should I follow while restricting fluids?  Make sure that you stay within the recommended limit each day. Always measure and keep track of your fluids, as well as any foods that turn liquid at room temperature.  Use small cups and glasses and learn to sip fluids slowly.  Add a slice of fresh lemon or lemon juice to water or ice. This helps to satisfy your thirst.  Freeze fruit juice or water in an ice cube tray. Use this as part of your fluid allowance. These cubes are useful for quenching your thirst. Measure the amount of liquid in each ice cube prior to freezing so you can subtract this amount from your day's allowance when you consume each frozen cube.  Try frozen fruits between meals, such as grapes or strawberries.  Swallow your pills along with meals or soft foods, such as applesauce or mashed potatoes. This helps you to save your  fluid allowance for something that you enjoy.  Weigh yourself every day. Keeping track of your daily weight can help you and your health care provider to notice as soon as possible if you are retaining too much fluid in your body. ? Weigh yourself every morning after you urinate but before you eat breakfast. ? Wear the same amount of clothing each time you weigh yourself. ? Write down your daily weight. Give this weight record to your health care provider. If your weight is going up, you may be retaining too much fluid. Every 2 cups (480 mL) of fluid retained in the body becomes an extra 1 lb (0.45 kg) of body weight.  Avoid salty foods. These foods make you thirsty and make fluid control more difficult.  Brush your teeth often or rinse your mouth with mouthwash to help your dry mouth. Lemon wedges, hard sour candies, chewing gum, or breath spray may also help to moisten your mouth.  Keep the temperature in your home at a cooler level. Dry air increases thirst, so keep the air in your home as  humid as possible.  Avoid being out in the hot sun, which can cause you to sweat and become thirsty. What are some signs that I may be taking in too much fluid? You may be taking in too much fluid if:  Your weight increases. Contact your health care provider if your weight increases 3 lb or more in a day or if it increases 5 lb or more in a week.  Your face, hands, legs, feet, and belly (abdomen) start to swell.  You have trouble breathing.  This information is not intended to replace advice given to you by your health care provider. Make sure you discuss any questions you have with your health care provider. Document Released: 09/02/2007 Document Revised: 04/12/2016 Document Reviewed: 04/06/2014 Elsevier Interactive Patient Education  [employer].

## 2017-11-01 NOTE — Progress Notes (Signed)
Cardiology Office Note   Date:  11/01/2017   ID:  Kayla Chambers, Kayla Chambers June 02, 1927, MRN [id number]  PCP:  Donald Prose, MD   Cardiologist: Dr. Stanford Breed, 10/17/2017, and at cardioversion 10/22/2017 Rosaria Ferries, PA-C   Chief Complaint  Patient presents with  . Follow-up    pt stated she have not been feeling well, pt c/o SOB and fatigue    History of Present Illness: Kayla Chambers is a 83 y.o. female with a history of CABG (SVG-RCA) w/ BENTALL/AVR/MAZE 2014, D-CHF, Hypothyroid, PAF s/p DCCV 01/2017 & 10/22/2017  11/29 office visit, weight up 10-15 pounds and patient in atrial fib, amiodarone 200 mg twice daily added and cardiovert scheduled 12/04, Lasix 40 mg am and 20 mg pm for 2 days then 40 mg daily, K-Dur 20 mEq a day  Kayla Chambers presents for cardiology follow up.  She is here today with her husband.  Her swelling is down. She has not been weighing herself but can start.   She says she gets plenty of salt, mostly intrinsic in the foods she eats.   "I'd rather die than give up salt"  They tend to cook simply and eat prepared foods.  They have not really been paying attention to the amount of salt in the foods.  She still has some lower extremity edema.  She is breathing a little better.  She still gets short of breath with exertion and sometimes feels like she has trouble taking a very deep breath.  She took off her shoes to be weighed last time and left them on today, so it is likely her weight is down the pound or so.  She has not had chest pain.  She has not had palpitations and is not aware that her heart is back out of rhythm.  She has not had presyncope or syncope.   Past Medical History:  Diagnosis Date  . Anxiety   . Aortic insufficiency    a. 03/2013 s/p  AVR w/ biologic 50mm Magna Ease peric tissue valve, model 300TFX, ser# [id number].   Marland Kitchen CAD (coronary artery disease)    a. 01/2013 mod calcification w/o sev dzs;  b. 03/2013 CABG x1 VG->RCA @ time of  AVR  . Chronic diastolic CHF (congestive heart failure) ([place])    a. 01/2013 echo: EF 50-55%.  . Dilated aortic root ([place])    a. 03/2013 s/p Bentall procedure with Ao Root replacement (85mm Valsalva graft) w/ reimplantationof the R and L coronary ostium.  . Fibromyalgia   . Headache(784.0)    hx migraines  . Hypothyroidism   . Intracranial aneurysm    in late 1970's  . 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 Neoadjuvant RT by Dr. Yisroel Ramming. Surveillance plan: MRI 12/08/11 shows changes in presumed post-op seroma/hematoma with slight increase in size - follow up MRI 11/18/12 showed same size. Will continue with q3 month MRI. CT C/A/P annually alternating with CXR at 6 months.   . Osteoporosis   . Paroxysmal A-fib ([place])    a. Dx 01/2013 - placed on xarelto;  b. 03/2013 s/p left sided MAZE and LA clip @ time of AVR/CABG.  . Peripheral neuropathy   . PMR (polymyalgia rheumatica) ([facility])    Inactive  . Rheumatic heart disease   . 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 Vertigo  Past Surgical History:  Procedure Laterality Date  . ABDOMINAL HYSTERECTOMY    . ASCENDING AORTIC ROOT REPLACEMENT N/A 03/31/2013   Procedure: ASCENDING AORTIC ROOT REPLACEMENT;  Surgeon: Grace Isaac, MD;  Location: [place];  Service: Open Heart Surgery;  Laterality: N/A;  . BLADDER SUSPENSION    . CARDIOVERSION N/A 01/29/2017   Procedure: CARDIOVERSION;  Surgeon: Jerline Pain, MD;  Location: [place];  Service: Cardiovascular;  Laterality: N/A;  . CARDIOVERSION N/A 10/22/2017   Procedure: CARDIOVERSION;  Surgeon: Lelon Perla, MD;  [facility] ENDOSCOPY;  Service: Cardiovascular;  Laterality: N/A;  . COLONOSCOPY  10/25/2011   Procedure: COLONOSCOPY;  Surgeon: Garlan Fair, MD;  Location: [facility] ENDOSCOPY;  Service: Endoscopy;  Laterality: N/A;  . EYE SURGERY Bilateral     cataracts  . INTRAOPERATIVE TRANSESOPHAGEAL ECHOCARDIOGRAM N/A 03/31/2013   Procedure: INTRAOPERATIVE TRANSESOPHAGEAL ECHOCARDIOGRAM;  Surgeon: Grace Isaac, MD;  Location: [place];  Service: Open Heart Surgery;  Laterality: N/A;  . LEFT AND RIGHT HEART CATHETERIZATION WITH CORONARY ANGIOGRAM Right 02/02/2013   Procedure: LEFT AND RIGHT HEART CATHETERIZATION WITH CORONARY ANGIOGRAM;  Surgeon: Hillary Bow, MD;  Location[facility] CATH LAB;  Service: Cardiovascular;  Laterality: Right;  Marland Kitchen MAZE N/A 03/31/2013   Procedure: MAZE;  Surgeon: Grace Isaac, MD;  Location: [place];  Service: Open Heart Surgery;  Laterality: N/A;  . Sarcoma resection  2011    Current Outpatient Medications  Medication Sig Dispense Refill  . ALPRAZolam (XANAX) 0.5 MG tablet Take 0.5 mg by mouth at bedtime as needed for anxiety (for sleep).    Marland Kitchen amiodarone (PACERONE) 200 MG tablet Take 1 tablet (200 mg total) by mouth 2 (two) times daily. 180 tablet 3  . apixaban (ELIQUIS) 5 MG TABS tablet Take 1 tablet (5 mg total) by mouth 2 (two) times daily. 180 tablet 3  . Ascorbic Acid (VITAMIN C PO) Take 1 tablet by mouth daily as needed (FOR IMMUNE SYSTEM BOOST).     . Calcium Carbonate-Vitamin D (CALCIUM + D PO) Take 1 tablet by mouth daily. CALCIBOOST    . Cholecalciferol (VITAMIN D3) 2000 units capsule Take 2,000 Units by mouth daily.    Marland Kitchen CRANBERRY PO Take 1 tablet by mouth 2 (two) times daily as needed (FOR BLADDER/URINARY ISSUES.).     Marland Kitchen furosemide (LASIX) 40 MG tablet TAKE ONE TABLET TWICE DAILY FOR 2 DAYS THEN TAKE 1 TABLET ONCE DAILY (Patient taking differently: Take 40 mg by mouth daily. TAKE ONE TABLET TWICE DAILY FOR 2 DAYS THEN TAKE 1 TABLET ONCE DAILY) 92 tablet 3  . lactase (LACTAID) 3000 UNITS tablet Take 1 tablet by mouth daily as needed (only when eating dairy).    Marland Kitchen levothyroxine (SYNTHROID, LEVOTHROID) 75 MCG tablet Take 75 mcg by mouth at bedtime.     . Loperamide HCl (LOPERAMIDE A-D PO) Take 1 tablet by mouth  4 (four) times daily as needed (diarrhea/loose stools.).     Marland Kitchen metoprolol tartrate (LOPRESSOR) 50 MG tablet take 1/2 tablet by mouth twice a day (Patient taking differently: TAKE 0.5 TABLET (25 MG) BY MOUTH TWICE DAILY) 90 tablet 3  . Multiple Vitamin (MULTIVITAMIN WITH MINERALS) TABS Take 1 tablet by mouth daily.    . naproxen sodium (ALEVE) 220 MG tablet Take 220 mg by mouth 2 (two) times daily as needed (for pain.).    Marland Kitchen potassium chloride SA (K-DUR,KLOR-CON) 20 MEQ tablet Take 1 tablet (20 mEq total) by mouth daily. 90 tablet 3  . pravastatin (  PRAVACHOL) 40 MG tablet take 1 tablet by mouth every evening (Patient taking differently: TAKE 1 TABLET (40 MG) BY MOUTH ONCE DAILY IN THE EVENING.) 90 tablet 3  . Probiotic Product (PROBIOTIC PO) Take 1 capsule by mouth daily as needed (FOR DIGESTIVE HEALTH.).    Marland Kitchen rOPINIRole (REQUIP) 0.25 MG tablet Take 0.25 mg by mouth 3 (three) times daily as needed (for restless leg syndrome.).     No current facility-administered medications for this visit.     Allergies:   Alendronate sodium; Ambien [zolpidem tartrate]; Lac bovis; Lyrica [pregabalin]; Milk-related compounds; Neurontin [gabapentin]; Procaine hcl; and Toprol xl [metoprolol succinate]    Social History:  The patient  reports that  has never smoked. she has never used smokeless tobacco. She reports that she does not drink alcohol or use drugs.   Family History:  The patient's family history includes Anesthesia problems in her brother; Arthritis in her father; Cancer in her father; Heart disease in her mother; Hypertension in her sister; Stroke in her mother.    ROS:  Please see the history of present illness. All other systems are reviewed and negative.    PHYSICAL EXAM: VS:  BP 116/72   Pulse 77   Ht 5\' 5"  (1.651 m)   Wt 142 lb 6.4 oz (64.6 kg)   BMI 23.70 kg/m  , BMI Body mass index is 23.7 kg/m. GEN: Well nourished, well developed, female in no acute distress  HEENT: normal for age    Neck: JVD 9-10 cm, no carotid bruit, no masses Cardiac: Irreg R&R; 2/6 murmur, no rubs, or gallops Respiratory: Decreased breath sounds bases bilaterally, normal work of breathing GI: soft, nontender, nondistended, + BS MS: no deformity or atrophy; 1+ edema; distal pulses are 2+ in all 4 extremities   Skin: warm and dry, no rash Neuro:  Strength and sensation are intact Psych: euthymic mood, full affect   EKG:  EKG is ordered today.  It was reviewed by Dr. Stanford Breed The ekg ordered today demonstrates ectopic atrial tachycardia versus a wide atypical atrial flutter, ventricular rate 77 bpm, no obvious ischemic changes  ECHO: 10/18/2017 - Left ventricle: The cavity size was normal. Wall thickness was   increased in a pattern of mild LVH. Systolic function was normal.   The estimated ejection fraction was in the range of 60% to 65%.   Wall motion was normal; there were no regional wall motion   abnormalities. - Ventricular septum: The contour showed diastolic flattening and   systolic flattening. - Aortic valve: A bioprosthesis was present. - Aortic root: The aortic root was mildly dilated. - Mitral valve: Calcified annulus. Mildly thickened leaflets .   There was mild regurgitation. - Left atrium: The atrium was mildly dilated. - Right atrium: The atrium was severely dilated. - Atrial septum: The septum bowed from right to left, consistent   with increased right atrial pressure. - Tricuspid valve: There was severe regurgitation. Impressions: - Normal LV systolic function; mild LVH; s/p AVR with normal mean   gradient of 6 mmHg; mildly dilated aortic root; mild MR; mild   LAE; severe RAE; severe TR.   Recent Labs: 01/22/2017: TSH 4.32 05/16/2017: ALT 11; Hemoglobin 13.4; Platelets 179 10/17/2017: BUN 13; Creatinine, Ser 0.74; Potassium 4.9; Sodium 143    Lipid Panel    Component Value Date/Time   CHOL 154 05/16/2017 1218   TRIG 97 05/16/2017 1218   HDL 57 05/16/2017 1218    CHOLHDL 2.7 05/16/2017 1218   CHOLHDL 2.3  05/04/2016 0820   VLDL 15 05/04/2016 0820   LDLCALC 78 05/16/2017 1218     Wt Readings from Last 3 Encounters:  11/01/17 142 lb 6.4 oz (64.6 kg)  10/22/17 142 lb (64.4 kg)  10/17/17 142 lb (64.4 kg)     Other studies Reviewed: Additional studies/ records that were reviewed today include: Office notes, hospital records and testing.  ASSESSMENT AND PLAN: Dr. Stanford Breed was consulted on the plan and agrees  1.  Acute on chronic diastolic CHF: Her weight may be down a pound or so, but no significant change.  She is encouraged to start daily weights.  I explained the importance of reducing the amount of sodium in her diet and the husband is on board with this.  She likes salt and it may be difficult.  I emphasized that her breathing would be better and she might feel like doing more.  Go back up on the Lasix to 40 mg twice daily for 3 days or until she is down 5 pounds, then 40 mg a day.  Recheck a BMET today and decide on ongoing potassium dose.  2. PAF: The ECG was reviewed in detail by Dr. Stanford Breed.  The rhythm is either atrial tachycardia with variable ventricular response or atypical atrial flutter with variable ventricular response.  Continue amiodarone at 200 mg twice daily.  At her next office visit, if she is not in sinus rhythm, schedule a cardioversion.  By that time, she will have completed a 10 g load.  At that time, decrease amiodarone to 200 mg daily.  3.  Chronic anticoagulation: She has not missed any doses of her Eliquis.  She is not having any bleeding issues.   Current medicines are reviewed at length with the patient today.  The patient does not have concerns regarding medicines.  The following changes have been made: Increase Lasix  Labs/ tests ordered today include:   Orders Placed This Encounter  Procedures  . EKG 12-Lead     Disposition:   FU with Dr. Stanford Breed  Signed, Rosaria Ferries, PA-C  11/01/2017 1:34 PM     [place] Phone: [phone]; Fax: [phone]  This note was written with the assistance of speech recognition software. Please excuse any transcriptional errors.

## 2017-11-04 ENCOUNTER — Telehealth: Payer: Self-pay | Admitting: Cardiology

## 2017-11-04 NOTE — Telephone Encounter (Signed)
Patient husband, calling states that he would like to give a report to the nurse.

## 2017-11-04 NOTE — Telephone Encounter (Signed)
Spoke with pt husband, he reports the patient is doing better with the change in lasix and the swelling is gone. He has gotten the mobile device for the phone and he reports she has been in rhythm for the last 3 days. Aware no DCCV scheduled at this time and will be decided of needed at follow up appointment 11-14-17.

## 2017-11-04 NOTE — Progress Notes (Signed)
HPI: Follow-up CADstatus post coronary artery bypass graft, Bentall/AVR and maze procedure. She underwent a Bentall procedure by Dr. Servando Snare on 03/31/13. 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 Preoperatively she had been in and out of atrial fibrillation. Carotid Dopplers March 2017 showed 1-39% bilateral stenosis.Has had recurrent atrial fibrillation.Recently seen with recurrent atrial fibrillation and CHF; diuretics increased and amiodarone initiated; had repeat DCCV 10/22/17. Seen in fu and noted to be in atrial flutter. Plan was repeat DCCV following full amiodarone load. Last echocardiogram November 2018 showed normal LV function, bioprosthetic aortic valve, mild mitral regurgitation, mild left atrial enlargement,severe right atrial enlargement and severe TR. Since last seen, she is much improved. Her weight has decreased as has her edema. No chest pain or syncope.  Current Outpatient Medications  Medication Sig Dispense Refill  . ALPRAZolam (XANAX) 0.5 MG tablet Take 0.5 mg by mouth at bedtime as needed for anxiety (for sleep).    Marland Kitchen amiodarone (PACERONE) 200 MG tablet Take 1 tablet (200 mg total) by mouth 2 (two) times daily. 180 tablet 3  . apixaban (ELIQUIS) 5 MG TABS tablet Take 1 tablet (5 mg total) by mouth 2 (two) times daily. 180 tablet 3  . Ascorbic Acid (VITAMIN C PO) Take 1 tablet by mouth daily as needed (FOR IMMUNE SYSTEM BOOST).     . Calcium Carbonate-Vitamin D (CALCIUM + D PO) Take 1 tablet by mouth daily. CALCIBOOST    . Cholecalciferol (VITAMIN D3) 2000 units capsule Take 2,000 Units by mouth daily.    Marland Kitchen CRANBERRY PO Take 1 tablet by mouth 2 (two) times daily as needed (FOR BLADDER/URINARY ISSUES.).     Marland Kitchen furosemide (LASIX) 40 MG tablet Take 40 mg by mouth daily.    Marland Kitchen lactase (LACTAID) 3000 UNITS tablet Take 1 tablet by mouth daily as  needed (only when eating dairy).    Marland Kitchen levothyroxine (SYNTHROID, LEVOTHROID) 75 MCG tablet Take 75 mcg by mouth at bedtime.     . Loperamide HCl (LOPERAMIDE A-D PO) Take 1 tablet by mouth 4 (four) times daily as needed (diarrhea/loose stools.).     Marland Kitchen metoprolol tartrate (LOPRESSOR) 50 MG tablet take 1/2 tablet by mouth twice a day (Patient taking differently: TAKE 0.5 TABLET (25 MG) BY MOUTH TWICE DAILY) 90 tablet 3  . Multiple Vitamin (MULTIVITAMIN WITH MINERALS) TABS Take 1 tablet by mouth daily.    . naproxen sodium (ALEVE) 220 MG tablet Take 220 mg by mouth 2 (two) times daily as needed (for pain.).    Marland Kitchen potassium chloride SA (K-DUR,KLOR-CON) 20 MEQ tablet Take 1 tablet (20 mEq total) by mouth daily. 90 tablet 3  . pravastatin (PRAVACHOL) 40 MG tablet take 1 tablet by mouth every evening (Patient taking differently: TAKE 1 TABLET (40 MG) BY MOUTH ONCE DAILY IN THE EVENING.) 90 tablet 3  . Probiotic Product (PROBIOTIC PO) Take 1 capsule by mouth daily as needed (FOR DIGESTIVE HEALTH.).    Marland Kitchen rOPINIRole (REQUIP) 0.25 MG tablet Take 0.25 mg by mouth 3 (three) times daily as needed (for restless leg syndrome.).     No current facility-administered medications for this visit.      Past Medical History:  Diagnosis Date  . Anxiety   . Aortic insufficiency    a. 03/2013 s/p  AVR w/ biologic 68mm Magna Ease peric tissue valve, model 300TFX, ser# [id number].   Marland Kitchen  CAD (coronary artery disease)    a. 01/2013 mod calcification w/o sev dzs;  b. 03/2013 CABG x1 VG->RCA @ time of AVR  . Chronic diastolic CHF (congestive heart failure) ([place])    a. 01/2013 echo: EF 50-55%.  . Dilated aortic root ([place])    a. 03/2013 s/p Bentall procedure with Ao Root replacement (93mm Valsalva graft) w/ reimplantationof the R and L coronary ostium.  . Fibromyalgia   . Headache(784.0)    hx migraines  . Hypothyroidism   . Intracranial aneurysm    in late 1970's  . 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 Neoadjuvant RT by Dr. Yisroel Ramming. Surveillance plan: MRI 12/08/11 shows changes in presumed post-op seroma/hematoma with slight increase in size - follow up MRI 11/18/12 showed same size. Will continue with q3 month MRI. CT C/A/P annually alternating with CXR at 6 months.   . Osteoporosis   . Paroxysmal A-fib ([place])    a. Dx 01/2013 - placed on xarelto;  b. 03/2013 s/p left sided MAZE and LA clip @ time of AVR/CABG.  . Peripheral neuropathy   . PMR (polymyalgia rheumatica) (H[facility])    Inactive  . Rheumatic heart disease   . 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 Vertigo     Past Surgical History:  Procedure Laterality Date  . ABDOMINAL HYSTERECTOMY    . ASCENDING AORTIC ROOT REPLACEMENT N/A 03/31/2013   Procedure: ASCENDING AORTIC ROOT REPLACEMENT;  Surgeon: Grace Isaac, MD;  Location: [place];  Service: Open Heart Surgery;  Laterality: N/A;  . BLADDER SUSPENSION    . CARDIOVERSION N/A 01/29/2017   Procedure: CARDIOVERSION;  Surgeon: Jerline Pain, MD;  Location: [place];  Service: Cardiovascular;  Laterality: N/A;  . CARDIOVERSION N/A 10/22/2017   Procedure: CARDIOVERSION;  Surgeon: Lelon Perla, MD;  Location: [facility] ENDOSCOPY;  Service: Cardiovascular;  Laterality: N/A;  . COLONOSCOPY  10/25/2011   Procedure: COLONOSCOPY;  Surgeon: Garlan Fair, MD;  Location: [facility] ENDOSCOPY;  Service: Endoscopy;  Laterality: N/A;  . EYE SURGERY Bilateral    cataracts  . INTRAOPERATIVE TRANSESOPHAGEAL ECHOCARDIOGRAM N/A 03/31/2013   Procedure: INTRAOPERATIVE TRANSESOPHAGEAL ECHOCARDIOGRAM;  Surgeon: Grace Isaac, MD;  Location: [place];  Service: Open Heart Surgery;  Laterality: N/A;  . LEFT AND RIGHT HEART CATHETERIZATION WITH CORONARY ANGIOGRAM Right 02/02/2013   Procedure: LEFT AND RIGHT HEART CATHETERIZATION WITH CORONARY ANGIOGRAM;  Surgeon: Hillary Bow, MD;  Location: [facility] CATH  LAB;  Service: Cardiovascular;  Laterality: Right;  Marland Kitchen MAZE N/A 03/31/2013   Procedure: MAZE;  Surgeon: Grace Isaac, MD;  Location: [place];  Service: Open Heart Surgery;  Laterality: N/A;  . Sarcoma resection  2011    Social History   Socioeconomic History  . Marital status: Married    Spouse name: Not on file  . Number of children: Not on file  . Years of education: Not on file  . Highest education level: Not on file  Social Needs  . Financial resource strain: Not on file  . Food insecurity - worry: Not on file  . Food insecurity - inability: Not on file  . Transportation needs - medical: Not on file  . Transportation needs - non-medical: Not on file  Occupational History  . Not on file  Tobacco Use  . Smoking status: Never Smoker  . Smokeless tobacco: Never Used  Substance and Sexual Activity  . Alcohol use: No  . Drug use: No  . Sexual activity: Yes  Other Topics Concern  . Not on file  Social History Narrative  . Not on file    Family History  Problem Relation Age of Onset  . Heart disease Mother   . Stroke Mother   . Arthritis Father   . Cancer Father   . Anesthesia problems Brother   . Hypertension Sister     ROS: no fevers or chills, productive cough, hemoptysis, dysphasia, odynophagia, melena, hematochezia, dysuria, hematuria, rash, seizure activity, orthopnea, PND, pedal edema, claudication. Remaining systems are negative.  Physical Exam: Well-developed well-nourished in no acute distress.  Skin is warm and dry.  HEENT is normal.  Neck is supple.  Chest is clear to auscultation with normal expansion.  Cardiovascular exam is regular rate and rhythm.  Abdominal exam nontender or distended. No masses palpated. Extremities show no edema. neuro grossly intact  ECG- sinus bradycardia with first-degree AV block. Cannot rule out prior septal infarct. Nonspecific ST changes.personally reviewed  A/P  1 paroxysmal atrial fibrillation/flutterpatient has  converted to sinus rhythm. Continue amiodarone.decrease to 200 mg daily. Continue apixaban. I will see her back in 3 months. Check TSH, liver functions and chest x-ray at that time.  2 coronary artery disease-continue statin. Not on aspirin given need for anticoagulation.  3 hypertension-blood pressure controlled. Continue present medications.  4 hyperlipidemia-continue statin.  5 status post aortic valve replacement-continue SBE prophylaxis.  6 chronic diastolic congestive heart failure-volume status is much improved now that she is back in sinus rhythm. This is likely the cause of her worsening congestive heart failure. We will continue with present dose of Lasix. In 2 weeks check potassium and renal function. She may require lower dose Lasix in the future. She was on 20 mg daily prior to recurrence of atrial fibrillation.  7 tricuspid regurgitation-conservative measures given patient's age and comorbidities.  [name], MD

## 2017-11-11 DIAGNOSIS — M353 Polymyalgia rheumatica: Secondary | ICD-10-CM | POA: Insufficient documentation

## 2017-11-11 DIAGNOSIS — I671 Cerebral aneurysm, nonruptured: Secondary | ICD-10-CM | POA: Insufficient documentation

## 2017-11-11 DIAGNOSIS — F419 Anxiety disorder, unspecified: Secondary | ICD-10-CM | POA: Insufficient documentation

## 2017-11-11 DIAGNOSIS — M797 Fibromyalgia: Secondary | ICD-10-CM | POA: Insufficient documentation

## 2017-11-11 DIAGNOSIS — I480 Paroxysmal atrial fibrillation: Secondary | ICD-10-CM | POA: Insufficient documentation

## 2017-11-11 DIAGNOSIS — I099 Rheumatic heart disease, unspecified: Secondary | ICD-10-CM | POA: Insufficient documentation

## 2017-11-14 ENCOUNTER — Encounter: Payer: Self-pay | Admitting: Cardiology

## 2017-11-14 ENCOUNTER — Ambulatory Visit (INDEPENDENT_AMBULATORY_CARE_PROVIDER_SITE_OTHER): Payer: Medicare Other | Admitting: Cardiology

## 2017-11-14 VITALS — BP 118/70 | HR 55 | Ht 65.0 in | Wt 135.0 lb

## 2017-11-14 DIAGNOSIS — I4892 Unspecified atrial flutter: Secondary | ICD-10-CM | POA: Diagnosis not present

## 2017-11-14 DIAGNOSIS — I480 Paroxysmal atrial fibrillation: Secondary | ICD-10-CM | POA: Diagnosis not present

## 2017-11-14 DIAGNOSIS — E7800 Pure hypercholesterolemia, unspecified: Secondary | ICD-10-CM | POA: Diagnosis not present

## 2017-11-14 DIAGNOSIS — I25810 Atherosclerosis of coronary artery bypass graft(s) without angina pectoris: Secondary | ICD-10-CM | POA: Diagnosis not present

## 2017-11-14 DIAGNOSIS — I10 Essential (primary) hypertension: Secondary | ICD-10-CM | POA: Diagnosis not present

## 2017-11-14 NOTE — Patient Instructions (Signed)
Medication Instructions:   NO CHANGE  Labwork:  Your physician recommends that you return for lab work in: [place]:  Your physician recommends that you schedule a follow-up appointment in: [place]   If you need a refill on your cardiac medications before your next appointment, please call your pharmacy.

## 2017-11-21 ENCOUNTER — Ambulatory Visit: Payer: Medicare Other | Admitting: Physician Assistant

## 2017-11-28 DIAGNOSIS — I4892 Unspecified atrial flutter: Secondary | ICD-10-CM | POA: Diagnosis not present

## 2017-11-28 LAB — BASIC METABOLIC PANEL
BUN / CREAT RATIO: 21 (ref 12–28)
BUN: 18 mg/dL (ref 8–27)
CHLORIDE: 98 mmol/L (ref 96–106)
CO2: 26 mmol/L (ref 20–29)
Calcium: 9.3 mg/dL (ref 8.7–10.3)
Creatinine, Ser: 0.85 mg/dL (ref 0.57–1.00)
GFR calc Af Amer: 71 mL/min/{1.73_m2} (ref >59)
GFR, EST NON AFRICAN AMERICAN: 61 mL/min/{1.73_m2} (ref >59)
GLUCOSE: 89 mg/dL (ref 65–99)
POTASSIUM: 4.4 mmol/L (ref 3.5–5.2)
Sodium: 139 mmol/L (ref 134–144)

## 2017-12-23 ENCOUNTER — Ambulatory Visit: Payer: Medicare Other | Admitting: Cardiology

## 2017-12-31 DIAGNOSIS — Z124 Encounter for screening for malignant neoplasm of cervix: Secondary | ICD-10-CM | POA: Diagnosis not present

## 2017-12-31 DIAGNOSIS — Z1231 Encounter for screening mammogram for malignant neoplasm of breast: Secondary | ICD-10-CM | POA: Diagnosis not present

## 2017-12-31 DIAGNOSIS — N958 Other specified menopausal and perimenopausal disorders: Secondary | ICD-10-CM | POA: Diagnosis not present

## 2017-12-31 DIAGNOSIS — Z6822 Body mass index (BMI) 22.0-22.9, adult: Secondary | ICD-10-CM | POA: Diagnosis not present

## 2017-12-31 DIAGNOSIS — M816 Localized osteoporosis [Lequesne]: Secondary | ICD-10-CM | POA: Diagnosis not present

## 2018-01-01 ENCOUNTER — Other Ambulatory Visit: Payer: Self-pay | Admitting: Obstetrics and Gynecology

## 2018-01-01 DIAGNOSIS — R928 Other abnormal and inconclusive findings on diagnostic imaging of breast: Secondary | ICD-10-CM

## 2018-01-06 ENCOUNTER — Ambulatory Visit
Admission: RE | Admit: 2018-01-06 | Discharge: 2018-01-06 | Disposition: A | Discharge status: Home / Self Care | Payer: Medicare Other | Source: Ambulatory Visit | Attending: Obstetrics and Gynecology | Admitting: Obstetrics and Gynecology

## 2018-01-06 DIAGNOSIS — R928 Other abnormal and inconclusive findings on diagnostic imaging of breast: Secondary | ICD-10-CM

## 2018-01-06 DIAGNOSIS — N6002 Solitary cyst of left breast: Secondary | ICD-10-CM | POA: Diagnosis not present

## 2018-01-14 DIAGNOSIS — R918 Other nonspecific abnormal finding of lung field: Secondary | ICD-10-CM | POA: Diagnosis not present

## 2018-01-14 DIAGNOSIS — C4922 Malignant neoplasm of connective and soft tissue of left lower limb, including hip: Secondary | ICD-10-CM | POA: Diagnosis not present

## 2018-01-14 DIAGNOSIS — Z483 Aftercare following surgery for neoplasm: Secondary | ICD-10-CM | POA: Diagnosis not present

## 2018-01-14 DIAGNOSIS — Z9889 Other specified postprocedural states: Secondary | ICD-10-CM | POA: Diagnosis not present

## 2018-01-14 DIAGNOSIS — I517 Cardiomegaly: Secondary | ICD-10-CM | POA: Diagnosis not present

## 2018-01-14 DIAGNOSIS — M25552 Pain in left hip: Secondary | ICD-10-CM | POA: Diagnosis not present

## 2018-01-14 DIAGNOSIS — K228 Other specified diseases of esophagus: Secondary | ICD-10-CM | POA: Diagnosis not present

## 2018-01-14 DIAGNOSIS — K7689 Other specified diseases of liver: Secondary | ICD-10-CM | POA: Diagnosis not present

## 2018-01-14 DIAGNOSIS — R911 Solitary pulmonary nodule: Secondary | ICD-10-CM | POA: Diagnosis not present

## 2018-01-14 DIAGNOSIS — C499 Malignant neoplasm of connective and soft tissue, unspecified: Secondary | ICD-10-CM | POA: Diagnosis not present

## 2018-01-14 DIAGNOSIS — K769 Liver disease, unspecified: Secondary | ICD-10-CM | POA: Diagnosis not present

## 2018-02-05 NOTE — Progress Notes (Signed)
HPI: Follow-up CADstatus post coronary artery bypass graft, Bentall/AVR and maze procedure. She underwent a Bentall procedure by Dr. Servando Snare on 03/31/13. 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 Preoperatively she had been in and out of atrial fibrillation. Carotid Dopplers March 2017 showed 1-39% bilateral stenosis.Has had recurrent atrial fibrillation.Recently seen with recurrent atrial fibrillation and CHF; diuretics increased and amiodarone initiated; had repeat DCCV 10/22/17. Seen in fu and noted to be in atrial flutter. Plan was repeat DCCV following full amiodarone load. Last echocardiogram November 2018 showed normal LV function, bioprosthetic aortic valve, mild mitral regurgitation, mild left atrial enlargement,severe right atrial enlargement and severe TR. Since last seen,  patient notes some fatigue and some dyspnea on exertion.  No orthopnea or PND.  Mild pedal edema left lower extremity.  No chest pain or syncope.  Current Outpatient Medications  Medication Sig Dispense Refill  . ALPRAZolam (XANAX) 0.5 MG tablet Take 0.5 mg by mouth at bedtime as needed for anxiety (for sleep).    Marland Kitchen amiodarone (PACERONE) 200 MG tablet Take 1 tablet (200 mg total) by mouth 2 (two) times daily. 180 tablet 3  . apixaban (ELIQUIS) 5 MG TABS tablet Take 1 tablet (5 mg total) by mouth 2 (two) times daily. 180 tablet 3  . Ascorbic Acid (VITAMIN C PO) Take 1 tablet by mouth daily as needed (FOR IMMUNE SYSTEM BOOST).     . Calcium Carbonate-Vitamin D (CALCIUM + D PO) Take 1 tablet by mouth daily. CALCIBOOST    . Cholecalciferol (VITAMIN D3) 2000 units capsule Take 2,000 Units by mouth daily.    Marland Kitchen CRANBERRY PO Take 1 tablet by mouth 2 (two) times daily as needed (FOR BLADDER/URINARY ISSUES.).     Marland Kitchen furosemide (LASIX) 40 MG tablet Take 40 mg by mouth daily.    Marland Kitchen lactase  (LACTAID) 3000 UNITS tablet Take 1 tablet by mouth daily as needed (only when eating dairy).    Marland Kitchen levothyroxine (SYNTHROID, LEVOTHROID) 75 MCG tablet Take 75 mcg by mouth at bedtime.     . Loperamide HCl (LOPERAMIDE A-D PO) Take 1 tablet by mouth 4 (four) times daily as needed (diarrhea/loose stools.).     Marland Kitchen metoprolol tartrate (LOPRESSOR) 50 MG tablet take 1/2 tablet by mouth twice a day (Patient taking differently: TAKE 0.5 TABLET (25 MG) BY MOUTH TWICE DAILY) 90 tablet 3  . Multiple Vitamin (MULTIVITAMIN WITH MINERALS) TABS Take 1 tablet by mouth daily.    . naproxen sodium (ALEVE) 220 MG tablet Take 220 mg by mouth 2 (two) times daily as needed (for pain.).    Marland Kitchen potassium chloride SA (K-DUR,KLOR-CON) 20 MEQ tablet Take 1 tablet (20 mEq total) by mouth daily. 90 tablet 3  . pravastatin (PRAVACHOL) 40 MG tablet take 1 tablet by mouth every evening (Patient taking differently: TAKE 1 TABLET (40 MG) BY MOUTH ONCE DAILY IN THE EVENING.) 90 tablet 3  . Probiotic Product (PROBIOTIC PO) Take 1 capsule by mouth daily as needed (FOR DIGESTIVE HEALTH.).    Marland Kitchen rOPINIRole (REQUIP) 0.25 MG tablet Take 0.25 mg by mouth 3 (three) times daily as needed (for restless leg syndrome.).     No current facility-administered medications for this visit.      Past Medical History:  Diagnosis Date  . Anxiety   . Aortic insufficiency    a. 03/2013 s/p  AVR w/ biologic 61mm Magna  Ease peric tissue valve, model 300TFX, ser# [id number].   Marland Kitchen CAD (coronary artery disease)    a. 01/2013 mod calcification w/o sev dzs;  b. 03/2013 CABG x1 VG->RCA @ time of AVR  . Chronic diastolic CHF (congestive heart failure) ([place])    a. 01/2013 echo: EF 50-55%.  . Dilated aortic root ([place])    a. 03/2013 s/p Bentall procedure with Ao Root replacement (73mm Valsalva graft) w/ reimplantationof the R and L coronary ostium.  . Fibromyalgia   . Headache(784.0)    hx migraines  . Hypothyroidism   . Intracranial aneurysm    in late 1970's  .  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 Neoadjuvant RT by Dr. Yisroel Ramming. Surveillance plan: MRI 12/08/11 shows changes in presumed post-op seroma/hematoma with slight increase in size - follow up MRI 11/18/12 showed same size. Will continue with q3 month MRI. CT C/A/P annually alternating with CXR at 6 months.   . Osteoporosis   . Paroxysmal A-fib ([place])    a. Dx 01/2013 - placed on xarelto;  b. 03/2013 s/p left sided MAZE and LA clip @ time of AVR/CABG.  . Peripheral neuropathy   . PMR (polymyalgia rheumatica) (HCC)    Inactive  . Rheumatic heart disease   . 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 Vertigo     Past Surgical History:  Procedure Laterality Date  . ABDOMINAL HYSTERECTOMY    . ASCENDING AORTIC ROOT REPLACEMENT N/A 03/31/2013   Procedure: ASCENDING AORTIC ROOT REPLACEMENT;  Surgeon: Grace Isaac, MD;  Location: [place];  Service: Open Heart Surgery;  Laterality: N/A;  . BLADDER SUSPENSION    . CARDIOVERSION N/A 01/29/2017   Procedure: CARDIOVERSION;  Surgeon: Jerline Pain, MD;  Location: [place];  Service: Cardiovascular;  Laterality: N/A;  . CARDIOVERSION N/A 10/22/2017   Procedure: CARDIOVERSION;  Surgeon: Lelon Perla, MD;  Location: [facility] ENDOSCOPY;  Service: Cardiovascular;  Laterality: N/A;  . COLONOSCOPY  10/25/2011   Procedure: COLONOSCOPY;  Surgeon: Garlan Fair, MD;  [facility] ENDOSCOPY;  Service: Endoscopy;  Laterality: N/A;  . EYE SURGERY Bilateral    cataracts  . INTRAOPERATIVE TRANSESOPHAGEAL ECHOCARDIOGRAM N/A 03/31/2013   Procedure: INTRAOPERATIVE TRANSESOPHAGEAL ECHOCARDIOGRAM;  Surgeon: Grace Isaac, MD;  Location: [place];  Service: Open Heart Surgery;  Laterality: N/A;  . LEFT AND RIGHT HEART CATHETERIZATION WITH CORONARY ANGIOGRAM Right 02/02/2013   Procedure: LEFT AND RIGHT HEART CATHETERIZATION WITH CORONARY  ANGIOGRAM;  Surgeon: Hillary Bow, MD;  Location: [facility] CATH LAB;  Service: Cardiovascular;  Laterality: Right;  Marland Kitchen MAZE N/A 03/31/2013   Procedure: MAZE;  Surgeon: Grace Isaac, MD;  Location: [place];  Service: Open Heart Surgery;  Laterality: N/A;  . Sarcoma resection  2011    Social History   Socioeconomic History  . Marital status: Married    Spouse name: Not on file  . Number of children: Not on file  . Years of education: Not on file  . Highest education level: Not on file  Occupational History  . Not on file  Social Needs  . Financial resource strain: Not on file  . Food insecurity:    Worry: Not on file    Inability: Not on file  . Transportation needs:    Medical: Not on file    Non-medical: Not on file  Tobacco Use  .  Smoking status: Never Smoker  . Smokeless tobacco: Never Used  Substance and Sexual Activity  . Alcohol use: No  . Drug use: No  . Sexual activity: Yes  Lifestyle  . Physical activity:    Days per week: Not on file    Minutes per session: Not on file  . Stress: Not on file  Relationships  . Social connections:    Talks on phone: Not on file    Gets together: Not on file    Attends religious service: Not on file    Active member of club or organization: Not on file    Attends meetings of clubs or organizations: Not on file    Relationship status: Not on file  . Intimate partner violence:    Fear of current or ex partner: Not on file    Emotionally abused: Not on file    Physically abused: Not on file    Forced sexual activity: Not on file  Other Topics Concern  . Not on file  Social History Narrative  . Not on file    Family History  Problem Relation Age of Onset  . Heart disease Mother   . Stroke Mother   . Arthritis Father   . Cancer Father   . Anesthesia problems Brother   . Hypertension Sister     ROS: no fevers or chills, productive cough, hemoptysis, dysphasia, odynophagia, melena, hematochezia, dysuria, hematuria, rash,  seizure activity, orthopnea, PND, pedal edema, claudication. Remaining systems are negative.  Physical Exam: Well-developed frail in no acute distress.  Skin is warm and dry.  HEENT is normal.  Neck is supple.  Chest is clear to auscultation with normal expansion.  Cardiovascular exam is regular rate and rhythm.  2/6 systolic murmur left sternal border.  No diastolic murmur. Abdominal exam nontender or distended. No masses palpated. Extremities show trace to 1+ edema LLE neuro grossly intact  ECG-sinus bradycardia at a rate of 45.  First-degree AV block.  Cannot rule out septal infarct.  Nonspecific ST changes.  Personally reviewed  A/P  1 paroxysmal atrial fibrillation/flutter-patient remains in sinus rhythm today.  Continue amiodarone but decrease to 200 mg daily.  Heart rate is low.  Discontinue metoprolol. Continue apixaban.  Check TSH, liver functions and chest x-ray.  Check hemoglobin and renal function.  2 coronary artery disease-continue statin.  No aspirin given need for anticoagulation.  3 hypertension-blood pressure is controlled this morning.  However she is bradycardic.  Discontinue metoprolol and follow.  4 hyperlipidemia-continue statin.  5 status post aortic valve replacement-continue SBE prophylaxis.  6 chronic diastolic congestive heart failure-patient appears to be doing well from a volume standpoint.  Continue present dose of Lasix. Check potassium and renal function.  7 tricuspid regurgitation-we are planning conservative measures given patient's overall condition and age.  [name], MD

## 2018-02-07 DIAGNOSIS — R062 Wheezing: Secondary | ICD-10-CM | POA: Diagnosis not present

## 2018-02-07 DIAGNOSIS — I480 Paroxysmal atrial fibrillation: Secondary | ICD-10-CM | POA: Diagnosis not present

## 2018-02-07 DIAGNOSIS — N390 Urinary tract infection, site not specified: Secondary | ICD-10-CM | POA: Diagnosis not present

## 2018-02-07 DIAGNOSIS — R140 Abdominal distension (gaseous): Secondary | ICD-10-CM | POA: Diagnosis not present

## 2018-02-14 ENCOUNTER — Ambulatory Visit (INDEPENDENT_AMBULATORY_CARE_PROVIDER_SITE_OTHER): Payer: Medicare Other | Admitting: Cardiology

## 2018-02-14 ENCOUNTER — Encounter: Payer: Self-pay | Admitting: Cardiology

## 2018-02-14 VITALS — BP 118/78 | HR 45 | Ht 60.5 in | Wt 136.0 lb

## 2018-02-14 DIAGNOSIS — I2510 Atherosclerotic heart disease of native coronary artery without angina pectoris: Secondary | ICD-10-CM | POA: Diagnosis not present

## 2018-02-14 DIAGNOSIS — I10 Essential (primary) hypertension: Secondary | ICD-10-CM | POA: Diagnosis not present

## 2018-02-14 DIAGNOSIS — I480 Paroxysmal atrial fibrillation: Secondary | ICD-10-CM | POA: Diagnosis not present

## 2018-02-14 LAB — HEPATIC FUNCTION PANEL
ALBUMIN: 4.1 g/dL (ref 3.5–4.7)
ALK PHOS: 91 IU/L (ref 39–117)
ALT: 31 IU/L (ref 0–32)
AST: 22 IU/L (ref 0–40)
BILIRUBIN, DIRECT: 0.2 mg/dL (ref 0.00–0.40)
Bilirubin Total: 0.6 mg/dL (ref 0.0–1.2)
TOTAL PROTEIN: 6.2 g/dL (ref 6.0–8.5)

## 2018-02-14 LAB — BASIC METABOLIC PANEL
BUN/Creatinine Ratio: 17 (ref 12–28)
BUN: 19 mg/dL (ref 8–27)
CO2: 27 mmol/L (ref 20–29)
CREATININE: 1.1 mg/dL — AB (ref 0.57–1.00)
Calcium: 9.2 mg/dL (ref 8.7–10.3)
Chloride: 98 mmol/L (ref 96–106)
GFR, EST AFRICAN AMERICAN: 52 mL/min/{1.73_m2} — AB (ref >59)
GFR, EST NON AFRICAN AMERICAN: 45 mL/min/{1.73_m2} — AB (ref >59)
Glucose: 67 mg/dL (ref 65–99)
Potassium: 5.1 mmol/L (ref 3.5–5.2)
Sodium: 138 mmol/L (ref 134–144)

## 2018-02-14 LAB — CBC
HEMATOCRIT: 38.5 % (ref 34.0–46.6)
HEMOGLOBIN: 13.5 g/dL (ref 11.1–15.9)
MCH: 33.2 pg — ABNORMAL HIGH (ref 26.6–33.0)
MCHC: 35.1 g/dL (ref 31.5–35.7)
MCV: 95 fL (ref 79–97)
Platelets: 143 10*3/uL — ABNORMAL LOW (ref 150–379)
RBC: 4.07 x10E6/uL (ref 3.77–5.28)
RDW: 16.9 % — ABNORMAL HIGH (ref 12.3–15.4)
WBC: 6.6 10*3/uL (ref 3.4–10.8)

## 2018-02-14 LAB — TSH: TSH: 6.23 u[IU]/mL — ABNORMAL HIGH (ref 0.450–4.500)

## 2018-02-14 MED ORDER — ALPRAZOLAM 0.5 MG PO TABS
0.5000 mg | ORAL_TABLET | Freq: Every evening | ORAL | 2 refills | Status: DC | PRN
Start: 2018-02-14 — End: 2018-05-08

## 2018-02-14 MED ORDER — AMIODARONE HCL 200 MG PO TABS: 200.0000 mg | ORAL_TABLET | Freq: Every day | ORAL | 3 refills | Status: DC

## 2018-02-14 NOTE — Patient Instructions (Signed)
Medication Instructions:   STOP METOPROLOL  DECREASE AMIODARONE TO 200 MG ONCE DAILY  Labwork:  Your physician recommends that you HAVE LAB WORK TODAY  Testing/Procedures:  A chest x-ray takes a picture of the organs and structures inside the chest, including the heart, lungs, and blood vessels. This test can show several things, including, whether the heart is enlarges; whether fluid is building up in the lungs; and whether pacemaker / defibrillator leads are still in place. AT [facility]  Follow-Up:  Your physician wants you to follow-up in: [place] will receive a reminder letter in the mail two months in advance. If you don't receive a letter, please call our office to schedule the follow-up appointment.   If you need a refill on your cardiac medications before your next appointment, please call your pharmacy.

## 2018-02-20 ENCOUNTER — Ambulatory Visit (HOSPITAL_COMMUNITY)
Admission: RE | Admit: 2018-02-20 | Discharge: 2018-02-20 | Disposition: A | Discharge status: Home / Self Care | Payer: Medicare Other | Source: Ambulatory Visit | Attending: Cardiology | Admitting: Cardiology

## 2018-02-20 DIAGNOSIS — Z79899 Other long term (current) drug therapy: Secondary | ICD-10-CM | POA: Diagnosis not present

## 2018-02-20 DIAGNOSIS — I480 Paroxysmal atrial fibrillation: Secondary | ICD-10-CM | POA: Diagnosis not present

## 2018-02-20 DIAGNOSIS — I517 Cardiomegaly: Secondary | ICD-10-CM | POA: Insufficient documentation

## 2018-04-09 ENCOUNTER — Emergency Department (HOSPITAL_COMMUNITY): Payer: Medicare Other

## 2018-04-09 ENCOUNTER — Observation Stay (HOSPITAL_COMMUNITY): Payer: Medicare Other

## 2018-04-09 ENCOUNTER — Observation Stay (HOSPITAL_COMMUNITY)
Admission: EM | Admit: 2018-04-09 | Discharge: 2018-04-10 | Disposition: A | Discharge status: Home / Self Care | Payer: Medicare Other | Attending: Internal Medicine | Admitting: Internal Medicine

## 2018-04-09 ENCOUNTER — Encounter (HOSPITAL_COMMUNITY): Payer: Self-pay

## 2018-04-09 ENCOUNTER — Other Ambulatory Visit: Payer: Self-pay

## 2018-04-09 DIAGNOSIS — G629 Polyneuropathy, unspecified: Secondary | ICD-10-CM

## 2018-04-09 DIAGNOSIS — Z85831 Personal history of malignant neoplasm of soft tissue: Secondary | ICD-10-CM | POA: Insufficient documentation

## 2018-04-09 DIAGNOSIS — R262 Difficulty in walking, not elsewhere classified: Secondary | ICD-10-CM | POA: Diagnosis not present

## 2018-04-09 DIAGNOSIS — M353 Polymyalgia rheumatica: Secondary | ICD-10-CM | POA: Diagnosis present

## 2018-04-09 DIAGNOSIS — Z951 Presence of aortocoronary bypass graft: Secondary | ICD-10-CM | POA: Insufficient documentation

## 2018-04-09 DIAGNOSIS — K769 Liver disease, unspecified: Secondary | ICD-10-CM | POA: Diagnosis present

## 2018-04-09 DIAGNOSIS — F419 Anxiety disorder, unspecified: Secondary | ICD-10-CM | POA: Diagnosis not present

## 2018-04-09 DIAGNOSIS — Z79899 Other long term (current) drug therapy: Secondary | ICD-10-CM | POA: Diagnosis not present

## 2018-04-09 DIAGNOSIS — S3991XA Unspecified injury of abdomen, initial encounter: Secondary | ICD-10-CM | POA: Diagnosis not present

## 2018-04-09 DIAGNOSIS — E785 Hyperlipidemia, unspecified: Secondary | ICD-10-CM | POA: Insufficient documentation

## 2018-04-09 DIAGNOSIS — I480 Paroxysmal atrial fibrillation: Secondary | ICD-10-CM | POA: Diagnosis not present

## 2018-04-09 DIAGNOSIS — S32030A Wedge compression fracture of third lumbar vertebra, initial encounter for closed fracture: Secondary | ICD-10-CM | POA: Diagnosis not present

## 2018-04-09 DIAGNOSIS — E039 Hypothyroidism, unspecified: Secondary | ICD-10-CM | POA: Diagnosis present

## 2018-04-09 DIAGNOSIS — I5032 Chronic diastolic (congestive) heart failure: Secondary | ICD-10-CM | POA: Diagnosis not present

## 2018-04-09 DIAGNOSIS — I10 Essential (primary) hypertension: Secondary | ICD-10-CM | POA: Diagnosis present

## 2018-04-09 DIAGNOSIS — F0390 Unspecified dementia without behavioral disturbance: Secondary | ICD-10-CM | POA: Insufficient documentation

## 2018-04-09 DIAGNOSIS — Z923 Personal history of irradiation: Secondary | ICD-10-CM | POA: Insufficient documentation

## 2018-04-09 DIAGNOSIS — M8008XA Age-related osteoporosis with current pathological fracture, vertebra(e), initial encounter for fracture: Principal | ICD-10-CM

## 2018-04-09 DIAGNOSIS — M542 Cervicalgia: Secondary | ICD-10-CM | POA: Diagnosis not present

## 2018-04-09 DIAGNOSIS — Z952 Presence of prosthetic heart valve: Secondary | ICD-10-CM | POA: Diagnosis not present

## 2018-04-09 DIAGNOSIS — M545 Low back pain, unspecified: Secondary | ICD-10-CM

## 2018-04-09 DIAGNOSIS — Z79890 Hormone replacement therapy (postmenopausal): Secondary | ICD-10-CM | POA: Diagnosis not present

## 2018-04-09 DIAGNOSIS — I110 Hypertensive heart disease with heart failure: Secondary | ICD-10-CM | POA: Insufficient documentation

## 2018-04-09 DIAGNOSIS — M4850XA Collapsed vertebra, not elsewhere classified, site unspecified, initial encounter for fracture: Secondary | ICD-10-CM | POA: Diagnosis present

## 2018-04-09 DIAGNOSIS — M549 Dorsalgia, unspecified: Secondary | ICD-10-CM | POA: Diagnosis present

## 2018-04-09 DIAGNOSIS — Z7901 Long term (current) use of anticoagulants: Secondary | ICD-10-CM | POA: Insufficient documentation

## 2018-04-09 DIAGNOSIS — R001 Bradycardia, unspecified: Secondary | ICD-10-CM | POA: Insufficient documentation

## 2018-04-09 DIAGNOSIS — S3992XA Unspecified injury of lower back, initial encounter: Secondary | ICD-10-CM | POA: Diagnosis not present

## 2018-04-09 DIAGNOSIS — W19XXXA Unspecified fall, initial encounter: Secondary | ICD-10-CM | POA: Insufficient documentation

## 2018-04-09 DIAGNOSIS — R109 Unspecified abdominal pain: Secondary | ICD-10-CM | POA: Diagnosis not present

## 2018-04-09 DIAGNOSIS — I2510 Atherosclerotic heart disease of native coronary artery without angina pectoris: Secondary | ICD-10-CM | POA: Diagnosis not present

## 2018-04-09 DIAGNOSIS — M1258 Traumatic arthropathy, other specified site: Secondary | ICD-10-CM | POA: Diagnosis not present

## 2018-04-09 DIAGNOSIS — T148XXA Other injury of unspecified body region, initial encounter: Secondary | ICD-10-CM | POA: Diagnosis not present

## 2018-04-09 HISTORY — DX: Dorsalgia, unspecified: M54.9

## 2018-04-09 HISTORY — DX: Unspecified dementia, unspecified severity, without behavioral disturbance, psychotic disturbance, mood disturbance, and anxiety: F03.90

## 2018-04-09 LAB — CBC WITH DIFFERENTIAL/PLATELET
BASOS ABS: 0 10*3/uL (ref 0.0–0.1)
Basophils Relative: 0 %
EOS PCT: 0 %
Eosinophils Absolute: 0 10*3/uL (ref 0.0–0.7)
HCT: 40.8 % (ref 36.0–46.0)
HEMOGLOBIN: 13.7 g/dL (ref 12.0–15.0)
Lymphocytes Relative: 9 %
Lymphs Abs: 0.6 10*3/uL — ABNORMAL LOW (ref 0.7–4.0)
MCH: 32.2 pg (ref 26.0–34.0)
MCHC: 33.6 g/dL (ref 30.0–36.0)
MCV: 96 fL (ref 78.0–100.0)
Monocytes Absolute: 0.4 10*3/uL (ref 0.1–1.0)
Monocytes Relative: 7 %
NEUTROS ABS: 5.3 10*3/uL (ref 1.7–7.7)
NEUTROS PCT: 84 %
PLATELETS: 164 10*3/uL (ref 150–400)
RBC: 4.25 MIL/uL (ref 3.87–5.11)
RDW: 14.8 % (ref 11.5–15.5)
WBC: 6.3 10*3/uL (ref 4.0–10.5)

## 2018-04-09 LAB — URINALYSIS, ROUTINE W REFLEX MICROSCOPIC
BILIRUBIN URINE: NEGATIVE
Glucose, UA: NEGATIVE mg/dL
Ketones, ur: 5 mg/dL — AB
Nitrite: NEGATIVE
PROTEIN: NEGATIVE mg/dL
Specific Gravity, Urine: 1.008 (ref 1.005–1.030)
pH: 8 (ref 5.0–8.0)

## 2018-04-09 LAB — COMPREHENSIVE METABOLIC PANEL
ALT: 30 U/L (ref 14–54)
ANION GAP: 11 (ref 5–15)
AST: 32 U/L (ref 15–41)
Albumin: 4.1 g/dL (ref 3.5–5.0)
Alkaline Phosphatase: 87 U/L (ref 38–126)
BILIRUBIN TOTAL: 0.8 mg/dL (ref 0.3–1.2)
BUN: 12 mg/dL (ref 6–20)
CO2: 24 mmol/L (ref 22–32)
Calcium: 9.2 mg/dL (ref 8.9–10.3)
Chloride: 103 mmol/L (ref 101–111)
Creatinine, Ser: 0.72 mg/dL (ref 0.44–1.00)
Glucose, Bld: 102 mg/dL — ABNORMAL HIGH (ref 65–99)
POTASSIUM: 3.7 mmol/L (ref 3.5–5.1)
Sodium: 138 mmol/L (ref 135–145)
TOTAL PROTEIN: 7.4 g/dL (ref 6.5–8.1)

## 2018-04-09 LAB — MAGNESIUM: Magnesium: 2 mg/dL (ref 1.7–2.4)

## 2018-04-09 LAB — POC OCCULT BLOOD, ED: Fecal Occult Bld: NEGATIVE

## 2018-04-09 MED ORDER — APIXABAN 5 MG PO TABS
5.0000 mg | ORAL_TABLET | Freq: Two times a day (BID) | ORAL | Status: DC
  Administered 2018-04-09 – 2018-04-10 (×2): 5 mg via ORAL
  Filled 2018-04-09 (×2): qty 1

## 2018-04-09 MED ORDER — AMIODARONE HCL 200 MG PO TABS
200.0000 mg | ORAL_TABLET | Freq: Every day | ORAL | Status: DC
  Administered 2018-04-09 – 2018-04-10 (×2): 200 mg via ORAL
  Filled 2018-04-09 (×2): qty 1

## 2018-04-09 MED ORDER — LEVALBUTEROL HCL 0.63 MG/3ML IN NEBU: 0.6300 mg | INHALATION_SOLUTION | Freq: Four times a day (QID) | RESPIRATORY_TRACT | Status: DC | PRN

## 2018-04-09 MED ORDER — IOPAMIDOL (ISOVUE-300) INJECTION 61%
100.0000 mL | Freq: Once | INTRAVENOUS | Status: AC | PRN
  Administered 2018-04-09: 100 mL via INTRAVENOUS

## 2018-04-09 MED ORDER — FUROSEMIDE 40 MG PO TABS
40.0000 mg | ORAL_TABLET | Freq: Every day | ORAL | Status: DC
  Administered 2018-04-09 – 2018-04-10 (×2): 40 mg via ORAL
  Filled 2018-04-09 (×2): qty 1

## 2018-04-09 MED ORDER — LEVOTHYROXINE SODIUM 50 MCG PO TABS
75.0000 ug | ORAL_TABLET | Freq: Every day | ORAL | Status: DC
  Administered 2018-04-09: 75 ug via ORAL
  Filled 2018-04-09: qty 1

## 2018-04-09 MED ORDER — METOCLOPRAMIDE HCL 5 MG/ML IJ SOLN
5.0000 mg | Freq: Once | INTRAMUSCULAR | Status: AC
  Administered 2018-04-09: 5 mg via INTRAVENOUS
  Filled 2018-04-09: qty 2

## 2018-04-09 MED ORDER — ONDANSETRON HCL 4 MG PO TABS: 4.0000 mg | ORAL_TABLET | Freq: Four times a day (QID) | ORAL | Status: DC | PRN

## 2018-04-09 MED ORDER — IOPAMIDOL (ISOVUE-300) INJECTION 61%
INTRAVENOUS | Status: AC
  Filled 2018-04-09: qty 100

## 2018-04-09 MED ORDER — MORPHINE SULFATE (PF) 2 MG/ML IV SOLN
2.0000 mg | Freq: Once | INTRAVENOUS | Status: AC
  Administered 2018-04-09: 2 mg via INTRAVENOUS
  Filled 2018-04-09: qty 1

## 2018-04-09 MED ORDER — FENTANYL CITRATE (PF) 100 MCG/2ML IJ SOLN
100.0000 ug | Freq: Once | INTRAMUSCULAR | Status: AC
  Administered 2018-04-09: 100 ug via INTRAVENOUS
  Filled 2018-04-09: qty 2

## 2018-04-09 MED ORDER — ACETAMINOPHEN 650 MG RE SUPP: 650.0000 mg | Freq: Four times a day (QID) | RECTAL | Status: DC | PRN

## 2018-04-09 MED ORDER — OXYCODONE HCL 5 MG PO TABS
5.0000 mg | ORAL_TABLET | ORAL | Status: DC | PRN
  Administered 2018-04-09: 5 mg via ORAL
  Filled 2018-04-09: qty 1

## 2018-04-09 MED ORDER — PRAVASTATIN SODIUM 40 MG PO TABS
40.0000 mg | ORAL_TABLET | Freq: Every evening | ORAL | Status: DC
  Administered 2018-04-09: 40 mg via ORAL

## 2018-04-09 MED ORDER — ACETAMINOPHEN 325 MG PO TABS: 650.0000 mg | ORAL_TABLET | Freq: Four times a day (QID) | ORAL | Status: DC | PRN

## 2018-04-09 MED ORDER — SODIUM CHLORIDE 0.9 % IV SOLN
INTRAVENOUS | Status: DC
  Administered 2018-04-09: 1000 mL via INTRAVENOUS

## 2018-04-09 MED ORDER — ROPINIROLE HCL 0.25 MG PO TABS
0.2500 mg | ORAL_TABLET | Freq: Three times a day (TID) | ORAL | Status: DC | PRN
  Filled 2018-04-09: qty 1

## 2018-04-09 MED ORDER — ONDANSETRON HCL 4 MG/2ML IJ SOLN
4.0000 mg | Freq: Four times a day (QID) | INTRAMUSCULAR | Status: DC | PRN
  Administered 2018-04-10: 4 mg via INTRAVENOUS
  Filled 2018-04-09: qty 2

## 2018-04-09 MED ORDER — ALPRAZOLAM 0.5 MG PO TABS: 0.5000 mg | ORAL_TABLET | Freq: Every evening | ORAL | Status: DC | PRN

## 2018-04-09 MED ORDER — POLYETHYLENE GLYCOL 3350 17 G PO PACK
17.0000 g | PACK | Freq: Every day | ORAL | Status: DC
  Administered 2018-04-10: 17 g via ORAL
  Filled 2018-04-09: qty 1

## 2018-04-09 MED ORDER — FENTANYL CITRATE (PF) 100 MCG/2ML IJ SOLN
25.0000 ug | INTRAMUSCULAR | Status: DC | PRN
  Administered 2018-04-09: 25 ug via INTRAVENOUS
  Filled 2018-04-09: qty 2

## 2018-04-09 NOTE — ED Provider Notes (Signed)
[facility] DEPT Provider Note   CSN: [id number] Arrival date & time: 04/09/18  5284     History   Chief Complaint Chief Complaint  Patient presents with  . Fall  . Back Pain    HPI Kayla Chambers is a 84 y.o. female.level 5 caveat dementia history is obtained frompatient, from patient's daughter and from her husband who accompany her  HPI Patient fell approximately 6 PM yesterday when "my kidney she got caught" since event she complains of low back pain and abdominal pain. No other complaint. She was not using cane orher walker when she fell. She was treated with Vicodin earlier today.she denies headache denies chest pain no other complaint. Her husband reports that a few days ago she became lightheaded for "a few seconds" and flat the flutter in her chestfor a few seconds. She did not suffer syncopal event. Patient denies headache denies chest pain denies neck pain. No other associated symptoms Past Medical History:  Diagnosis Date  . Anxiety   . Aortic insufficiency    a. 03/2013 s/p  AVR w/ biologic 6mm Magna Ease peric tissue valve, model 300TFX, ser# [id number].   . Back pain    BACK BRACE/FRACTURE  . CAD (coronary artery disease)    a. 01/2013 mod calcification w/o sev dzs;  b. 03/2013 CABG x1 VG->RCA @ time of AVR  . Chronic diastolic CHF (congestive heart failure) ([place])    a. 01/2013 echo: EF 50-55%.  . Dementia   . Dilated aortic root ([place])    a. 03/2013 s/p [place] procedure with Ao Root replacement (20mm Valsalva graft) w/ reimplantationof the R and L coronary ostium.  . Fibromyalgia   . Headache(784.0)    hx migraines  . Hypothyroidism   . Intracranial aneurysm    in late 1970's  . 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 Neoadjuvant RT by Dr. Yisroel Ramming. Surveillance plan: MRI 12/08/11 shows changes in presumed post-op seroma/hematoma with  slight increase in size - follow up MRI 11/18/12 showed same size. Will continue with q3 month MRI. CT C/A/P annually alternating with CXR at 6 months.   . Osteoporosis   . Paroxysmal A-fib ([place])    a. Dx 01/2013 - placed on xarelto;  b. 03/2013 s/p left sided MAZE and LA clip @ time of AVR/CABG.  . Peripheral neuropathy   . PMR (polymyalgia rheumatica) (HCC)    Inactive  . Rheumatic heart disease   . 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 Vertigo     Patient Active Problem List   Diagnosis Date Noted  . Rheumatic heart disease   . PMR (polymyalgia rheumatica) (HCC)   . Paroxysmal A-fib ([place])   . Intracranial aneurysm   . Fibromyalgia   . Anxiety   . H/O aortic valve replacement 02/02/2016  . Cerebrovascular disease 02/02/2016  . Hyperlipidemia 02/02/2016  . Vertigo 01/07/2015  . Bronchitis 12/02/2013  . Malaise and fatigue 05/26/2013  . PAF (paroxysmal atrial fibrillation) ([place]) 04/08/2013  . HTN (hypertension) 04/08/2013  . Chronic diastolic CHF (congestive heart failure) ([place]) 04/08/2013  . CAD (coronary artery disease) 04/08/2013  . Acute on chronic diastolic heart failure ([place]) 01/31/2013  . Disorder of liver 10/07/2012  . Solitary pulmonary nodule 10/07/2012  . 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 Elevated blood pressure 09/12/2011  .  Dizziness 08/09/2011  . Peripheral neuropathy 03/07/2011  . Aortic insufficiency 03/06/2011  . Dilated aortic root ([place]) 03/06/2011  . 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 Hypothyroidism 03/06/2011  . Polymyalgia rheumatica ([place]) 03/06/2011    Past Surgical History:  Procedure Laterality Date  . ABDOMINAL HYSTERECTOMY    . ASCENDING AORTIC ROOT REPLACEMENT N/A 03/31/2013   Procedure: ASCENDING AORTIC ROOT REPLACEMENT;  Surgeon: Grace Isaac, MD;  Location: [facility];  Service: Open Heart Surgery;  Laterality: N/A;  . BLADDER SUSPENSION    . CARDIOVERSION N/A 01/29/2017     Procedure: CARDIOVERSION;  Surgeon: Jerline Pain, MD;  Location: [facility];  Service: Cardiovascular;  Laterality: N/A;  . CARDIOVERSION N/A 10/22/2017   Procedure: CARDIOVERSION;  Surgeon: Lelon Perla, MD;  Location: [facility] ENDOSCOPY;  Service: Cardiovascular;  Laterality: N/A;  . COLONOSCOPY  10/25/2011   Procedure: COLONOSCOPY;  Surgeon: Garlan Fair, MD;  Location: [facility] ENDOSCOPY;  Service: Endoscopy;  Laterality: N/A;  . EYE SURGERY Bilateral    cataracts  . INTRAOPERATIVE TRANSESOPHAGEAL ECHOCARDIOGRAM N/A 03/31/2013   Procedure: INTRAOPERATIVE TRANSESOPHAGEAL ECHOCARDIOGRAM;  Surgeon: Grace Isaac, MD;  Location: [facility];  Service: Open Heart Surgery;  Laterality: N/A;  . LEFT AND RIGHT HEART CATHETERIZATION WITH CORONARY ANGIOGRAM Right 02/02/2013   Procedure: LEFT AND RIGHT HEART CATHETERIZATION WITH CORONARY ANGIOGRAM;  Surgeon: Hillary Bow, MD;  Location: [facility] CATH LAB;  Service: Cardiovascular;  Laterality: Right;  Marland Kitchen MAZE N/A 03/31/2013   Procedure: MAZE;  Surgeon: Grace Isaac, MD;  Location: [facility];  Service: Open Heart Surgery;  Laterality: N/A;  . Sarcoma resection  2011     OB History   None      Home Medications    Prior to Admission medications   Medication Sig Start Date End Date Taking? Authorizing Provider  ALPRAZolam Duanne Moron) 0.5 MG tablet Take 1 tablet (0.5 mg total) by mouth at bedtime as needed for anxiety (for sleep). 02/14/18  Yes Lelon Perla, MD  amiodarone (PACERONE) 200 MG tablet Take 1 tablet (200 mg total) by mouth daily. 02/14/18  Yes Lelon Perla, MD  apixaban (ELIQUIS) 5 MG TABS tablet Take 1 tablet (5 mg total) by mouth 2 (two) times daily. 06/12/17  Yes Lelon Perla, MD  Ascorbic Acid (VITAMIN C PO) Take 1 tablet by mouth daily as needed (FOR IMMUNE SYSTEM BOOST).    Yes [provider]  Calcium Carbonate-Vitamin D (CALCIUM + D PO) Take 1 tablet by mouth daily. CALCIBOOST   Yes [provider]   Cholecalciferol (VITAMIN D3) 2000 units capsule Take 2,000 Units by mouth daily.   Yes [provider]  CRANBERRY PO Take 1 tablet by mouth 2 (two) times daily as needed (FOR BLADDER/URINARY ISSUES.).    Yes [provider]  furosemide (LASIX) 40 MG tablet Take 40 mg by mouth daily.   Yes [provider]  lactase (LACTAID) 3000 UNITS tablet Take 1 tablet by mouth daily as needed (only when eating dairy).   Yes [provider]  levothyroxine (SYNTHROID, LEVOTHROID) 75 MCG tablet Take 75 mcg by mouth at bedtime.    Yes [provider]  Loperamide HCl (LOPERAMIDE A-D PO) Take 1 tablet by mouth 4 (four) times daily as needed (diarrhea/loose stools.).    Yes [provider]  Multiple Vitamin (MULTIVITAMIN WITH MINERALS) TABS Take 1 tablet by mouth daily.   Yes [provider]  naproxen sodium (ALEVE) 220 MG tablet Take 220 mg by mouth  2 (two) times daily as needed (for pain.).   Yes [provider]  potassium chloride SA (K-DUR,KLOR-CON) 20 MEQ tablet Take 1 tablet (20 mEq total) by mouth daily. 10/17/17  Yes Lelon Perla, MD  pravastatin (PRAVACHOL) 40 MG tablet take 1 tablet by mouth every evening Patient taking differently: TAKE 1 TABLET (40 MG) BY MOUTH ONCE DAILY IN THE EVENING. 04/16/17  Yes Crenshaw, Denice Bors, MD  Probiotic Product (PROBIOTIC PO) Take 1 capsule by mouth daily as needed ([place].).   Yes [provider]  rOPINIRole (REQUIP) 0.25 MG tablet Take 0.25 mg by mouth 3 (three) times daily as needed (for restless leg syndrome.).   Yes [provider]    Family History Family History  Problem Relation Age of Onset  . Heart disease Mother   . Stroke Mother   . Arthritis Father   . Cancer Father   . Anesthesia problems Brother   . Hypertension Sister     Social History Social History   Tobacco Use  . Smoking status: Never Smoker  . Smokeless tobacco: Never Used  Substance  Use Topics  . Alcohol use: No  . Drug use: No     Allergies   Alendronate sodium; Ambien [zolpidem tartrate]; Lac bovis; Lyrica [pregabalin]; Milk-related compounds; Neurontin [gabapentin]; Procaine hcl; and Toprol xl [metoprolol succinate]   Review of Systems Review of Systems  Unable to perform ROS: Dementia  Gastrointestinal: Positive for abdominal pain.  Musculoskeletal: Positive for back pain and gait problem.     Physical Exam Updated Vital Signs BP (!) 156/87 (BP Location: Right Arm)   Pulse 63   Temp 97.8 F (36.6 C) (Oral)   Resp 15   Ht 5\' 5"  (1.651 m)   Wt 60.3 kg (133 lb)   SpO2 98%   BMI 22.13 kg/m   Physical Exam  Constitutional:  Frail chronically ill-appearing  HENT:  Head: Normocephalic and atraumatic.  Eyes: Pupils are equal, round, and reactive to light. Conjunctivae are normal.  Neck: Neck supple. No tracheal deviation present. No thyromegaly present.  Cardiovascular: Normal rate and regular rhythm.  No murmur heard. Pulmonary/Chest: Effort normal and breath sounds normal.  Abdominal: Soft. Bowel sounds are normal. She exhibits no distension. There is tenderness.  Abdomen without contusion or abrasionTender lower quadrants.  Genitourinary:  Genitourinary Comments: Rectal normal tone brown stool no gross blood Hemoccult negative  Musculoskeletal: Normal range of motion. She exhibits no edema or tenderness.  No tenderness along the entire spine. She has pain at lumbar areawhen moving in bed. Pelvis stable nontender. All 4 extremities without deformity swelling or tenderness.  Neurological: She is alert. Coordination normal.  Skin: Skin is warm and dry. No rash noted.  3 cm purplish ecchymosis at right buttock otherwise no rash  Psychiatric: She has a normal mood and affect.  Nursing note and vitals reviewed.  Results for orders placed or performed during the hospital encounter of 04/09/18  Comprehensive metabolic panel  Result Value Ref Range     Sodium 138 135 - 145 mmol/L   Potassium 3.7 3.5 - 5.1 mmol/L   Chloride 103 101 - 111 mmol/L   CO2 24 22 - 32 mmol/L   Glucose, Bld 102 (H) 65 - 99 mg/dL   BUN 12 6 - 20 mg/dL   Creatinine, Ser 0.72 0.44 - 1.00 mg/dL   Calcium 9.2 8.9 - 10.3 mg/dL   Total Protein 7.4 6.5 - 8.1 g/dL   Albumin 4.1 3.5 -  5.0 g/dL   AST 32 15 - 41 U/L   ALT 30 14 - 54 U/L   Alkaline Phosphatase 87 38 - 126 U/L   Total Bilirubin 0.8 0.3 - 1.2 mg/dL   GFR calc non Af Amer >60 >60 mL/min   GFR calc Af Amer >60 >60 mL/min   Anion gap 11 5 - 15  CBC with Differential/Platelet  Result Value Ref Range   WBC 6.3 4.0 - 10.5 K/uL   RBC 4.25 3.87 - 5.11 MIL/uL   Hemoglobin 13.7 12.0 - 15.0 g/dL   HCT 40.8 36.0 - 46.0 %   MCV 96.0 78.0 - 100.0 fL   MCH 32.2 26.0 - 34.0 pg   MCHC 33.6 30.0 - 36.0 g/dL   RDW 14.8 11.5 - 15.5 %   Platelets 164 150 - 400 K/uL   Neutrophils Relative % 84 %   Neutro Abs 5.3 1.7 - 7.7 K/uL   Lymphocytes Relative 9 %   Lymphs Abs 0.6 (L) 0.7 - 4.0 K/uL   Monocytes Relative 7 %   Monocytes Absolute 0.4 0.1 - 1.0 K/uL   Eosinophils Relative 0 %   Eosinophils Absolute 0.0 0.0 - 0.7 K/uL   Basophils Relative 0 %   Basophils Absolute 0.0 0.0 - 0.1 K/uL  Urinalysis, Routine w reflex microscopic  Result Value Ref Range   Color, Urine YELLOW YELLOW   APPearance HAZY (A) CLEAR   Specific Gravity, Urine 1.008 1.005 - 1.030   pH 8.0 5.0 - 8.0   Glucose, UA NEGATIVE NEGATIVE mg/dL   Hgb urine dipstick MODERATE (A) NEGATIVE   Bilirubin Urine NEGATIVE NEGATIVE   Ketones, ur 5 (A) NEGATIVE mg/dL   Protein, ur NEGATIVE NEGATIVE mg/dL   Nitrite NEGATIVE NEGATIVE   Leukocytes, UA SMALL (A) NEGATIVE   RBC / HPF 11-20 0 - 5 RBC/hpf   WBC, UA 6-10 0 - 5 WBC/hpf   Bacteria, UA RARE (A) NONE SEEN  Magnesium  Result Value Ref Range   Magnesium 2.0 1.7 - 2.4 mg/dL  POC occult blood, ED Provider will collect  Result Value Ref Range   Fecal Occult Bld NEGATIVE NEGATIVE   Dg Lumbar Spine  Complete  Result Date: 04/09/2018 CLINICAL DATA:  Status post fall EXAM: LUMBAR SPINE - COMPLETE 4+ VIEW COMPARISON:  None. FINDINGS: Chronic L4 vertebral body compression fracture with approximately 50% height loss. L3 vertebral body compression fracture with mild progressive height loss compared with 02/09/2016 concerning for acute on subacute compression fracture. Degenerative disc disease with disc height loss at L1-2 and L2-3. Minimal retrolisthesis of L2 on L3. Severe bilateral facet arthropathy at L4-5 and L5-S1. IMPRESSION: L3 vertebral body compression fracture with mild progressive height loss compared with 02/09/2016 concerning for acute on subacute compression fracture. Electronically Signed   By: Kathreen Devoid   On: 04/09/2018 09:30   Ct Abdomen Pelvis W Contrast  Result Date: 04/09/2018 CLINICAL DATA:  Lower back pain after fall yesterday. EXAM: CT ABDOMEN AND PELVIS WITH CONTRAST TECHNIQUE: Multidetector CT imaging of the abdomen and pelvis was performed using the standard protocol following bolus administration of intravenous contrast. CONTRAST:  169mL ISOVUE-300 IOPAMIDOL (ISOVUE-300) INJECTION 61% COMPARISON:  None. FINDINGS: Lower chest: No acute abnormality. Hepatobiliary: No gallstones are noted. Mildly nodular hepatic contours are noted suggesting hepatic cirrhosis. 1 cm enhancing abnormality is noted in left hepatic lobe. No biliary dilatation is noted. Pancreas: Unremarkable. No pancreatic ductal dilatation or surrounding inflammatory changes. Spleen: Normal in size without focal abnormality. Adrenals/Urinary  Tract: Adrenal glands are unremarkable. Kidneys are normal, without renal calculi, focal lesion, or hydronephrosis. Bladder is unremarkable. Stomach/Bowel: Stomach is within normal limits. Appendix appears normal. No evidence of bowel wall thickening, distention, or inflammatory changes. Sigmoid diverticulosis is noted without inflammation. Vascular/Lymphatic: Aortic  atherosclerosis. No enlarged abdominal or pelvic lymph nodes. Reproductive: Status post hysterectomy. No adnexal masses. Other: No abdominal wall hernia or abnormality. No abdominopelvic ascites. Musculoskeletal: No acute or significant osseous findings. IMPRESSION: Mildly nodular hepatic contours are noted concerning for cirrhosis. 1 cm enhancing abnormality is noted in left hepatic lobe; this is not diagnostic for hemangioma on the basis of this exam, and further evaluation with MRI of the liver with and without gadolinium is recommended to rule out other pathology. Sigmoid diverticulosis without inflammation. No evidence of traumatic injury seen in the abdomen or pelvis. Aortic Atherosclerosis (ICD10-I70.0). Electronically Signed   By: Marijo Conception, M.D.   On: 04/09/2018 11:56    ED Treatments / Results  Labs (all labs ordered are listed, but only abnormal results are displayed) Labs Reviewed  POC OCCULT BLOOD, ED    EKG None  Radiology No results found.  Procedures Procedures (including critical care time)  Medications Ordered in ED Medications - No data to display   Initial Impression / Assessment and Plan / ED Course  I have reviewed the triage vital signs and the nursing notes.  Pertinent labs & imaging results that were available during my care of the patient were reviewed by me and considered in my medical decision making (see chart for details).     10:55 a.m. Requesting more pain medicine after treatment with intravenous fentanyl. Additional IV fentanyl ordered 1:40 PM after multiple doses of IV fentanyl. We placed in hard back brace on patient, which she wears chronically. She continues to have too much pain to sit up in bed.she became nauseated upon sitting up in bed Additional IV morphine ordered. IV Reglan ordered.  X-rays viewed by me and discussed with radiology. No further imaging of lumbar spine needed, no concern for unstable fracture.  Dr.abrol consulted and  will arrange for overnight stay.  Liver lesion was discussed with Dr.Abrol in with patient's husband. Can be evaluated as outpatient at family's and PMDs discretion labwork unremarkable Final Clinical Impressions(s) / ED Diagnoses  Diagnosis #1 fall #2 acute on chronic compression fracture of L3 vertebrae #3liver lesion Final diagnoses:  None    ED Discharge Orders    None       Orlie Dakin, MD 04/09/18 1440

## 2018-04-09 NOTE — ED Notes (Signed)
ED Provider at bedside. EDP J ATTEMPTING TO APPLY BACK BRACE. PT DID NOT TOLERATE. PT AWARE OF ADMISSION TO HOSPITAL . FAMILY PRESENT

## 2018-04-09 NOTE — ED Notes (Signed)
ED TO INPATIENT HANDOFF REPORT  Name/Age/Gender Kayla Chambers 84 y.o. female  Code Status    Code Status Orders  (From admission, onward)        Start     Ordered   04/09/18 1404  Full code  Continuous     04/09/18 1406    Code Status History    Date Active Date Inactive Code Status Order ID Comments User Context   04/07/2013 1142 04/10/2013 1908 Full Code [id number]  Arnoldo Lenis Inpatient   03/31/2013 1954 04/07/2013 1142 Full Code [id number]  Arnoldo Lenis Inpatient   01/30/2013 1859 02/04/2013 1645 Full Code [id number]  Darlin Coco, MD Inpatient    Advance Directive Documentation     Most Recent Value  Type of Advance Directive  Living will  Pre-existing out of facility DNR order (yellow form or pink MOST form)  -  "MOST" Form in Place?  -      Home/SNF/Other Home  Chief Complaint Fall  Level of Care/Admitting Diagnosis ED Disposition    ED Disposition Condition [place]: [place] [[id number]]  Level of Care: Telemetry [5]  Admit to tele based on following criteria: Complex arrhythmia (Bradycardia/Tachycardia)  Diagnosis: Compression fracture of vertebrae [facility]) [329518]  Admitting Physician: Reyne Dumas [[id number]]  Attending Physician: Reyne Dumas [[id number]]  PT Class (Do Not Modify): Observation [104]  PT Acc Code (Do Not Modify): Observation [10022]       Medical History Past Medical History:  Diagnosis Date  . Anxiety   . Aortic insufficiency    a. 03/2013 s/p  AVR w/ biologic 38m Magna Ease peric tissue valve, model 300TFX, ser# [id number]   . Back pain    BACK BRACE/FRACTURE  . CAD (coronary artery disease)    a. 01/2013 mod calcification w/o sev dzs;  b. 03/2013 CABG x1 VG->RCA @ time of AVR  . Chronic diastolic CHF (congestive heart failure) ([place]    a. 01/2013 echo: EF 50-55%.  . Dementia   . Dilated aortic root ([place]    a. 03/2013 s/p Bentall procedure with Ao Root replacement  (218mValsalva graft) w/ reimplantationof the R and L coronary ostium.  . Fibromyalgia   . Headache(784.0)    hx migraines  . Hypothyroidism   . Intracranial aneurysm    in late 1970's  . 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 Neoadjuvant RT by Dr. McYisroel RammingSurveillance plan: MRI 12/08/11 shows changes in presumed post-op seroma/hematoma with slight increase in size - follow up MRI 11/18/12 showed same size. Will continue with q3 month MRI. CT C/A/P annually alternating with CXR at 6 months.   . Osteoporosis   . Paroxysmal A-fib ([place]   a. Dx 01/2013 - placed on xarelto;  b. 03/2013 s/p left sided MAZE and LA clip @ time of AVR/CABG.  . Peripheral neuropathy   . PMR (polymyalgia rheumatica) (HCC)    Inactive  . Rheumatic heart disease   . [facility]   radiation & left lower extremity s/p resection in Feb 2012  . Vertigo     Allergies Allergies  Allergen Reactions  . Alendronate Sodium Nausea And Vomiting  . Ambien [Zolpidem Tartrate]     hallucinations  . Lac Bovis Nausea Only    Drinks soy milk  . Lyrica [Pregabalin] Nausea And Vomiting  . Milk-Related Compounds Diarrhea  .  Neurontin [Gabapentin] Nausea And Vomiting  . Procaine Hcl Other (See Comments)    Reaction unknown  . Toprol Xl [Metoprolol Succinate] Other (See Comments)    Does not remember. Tolerates Lopressor 04/10/13, Remigio Eisenmenger    IV Location/Drains/Wounds Patient Lines/Drains/Airways Status   Active Line/Drains/Airways    Name:   Placement date:   Placement time:   Site:   Days:   Peripheral IV 04/09/18 Left Forearm   04/09/18    -    Forearm   less than 1   External Urinary Catheter   04/09/18    0814    -   less than 1   Incision 03/31/13 Chest Other (Comment)   03/31/13    0909     1835   Incision 03/31/13 Leg Right   03/31/13    1859     1835          Labs/Imaging Results for orders placed or performed  during the hospital encounter of 04/09/18 (from the past 48 hour(s))  POC occult blood, ED Provider will collect     Status: None   Collection Time: 04/09/18  8:12 AM  Result Value Ref Range   Fecal Occult Bld NEGATIVE NEGATIVE  Comprehensive metabolic panel     Status: Abnormal   Collection Time: 04/09/18  8:32 AM  Result Value Ref Range   Sodium 138 135 - 145 mmol/L   Potassium 3.7 3.5 - 5.1 mmol/L   Chloride 103 101 - 111 mmol/L   CO2 24 22 - 32 mmol/L   Glucose, Bld 102 (H) 65 - 99 mg/dL   BUN 12 6 - 20 mg/dL   Creatinine, Ser 0.72 0.44 - 1.00 mg/dL   Calcium 9.2 8.9 - 10.3 mg/dL   Total Protein 7.4 6.5 - 8.1 g/dL   Albumin 4.1 3.5 - 5.0 g/dL   AST 32 15 - 41 U/L   ALT 30 14 - 54 U/L   Alkaline Phosphatase 87 38 - 126 U/L   Total Bilirubin 0.8 0.3 - 1.2 mg/dL   GFR calc non Af Amer >60 >60 mL/min   GFR calc Af Amer >60 >60 mL/min    Comment: (NOTE) The eGFR has been calculated using the CKD EPI equation. This calculation has not been validated in all clinical situations. eGFR's persistently <60 mL/min signify possible Chronic Kidney Disease.    Anion gap 11 5 - 15    Comment: Performed at [facility], [place] [address], [place], [place] [address]  CBC with Differential/Platelet     Status: Abnormal   Collection Time: 04/09/18  8:32 AM  Result Value Ref Range   WBC 6.3 4.0 - 10.5 K/uL   RBC 4.25 3.87 - 5.11 MIL/uL   Hemoglobin 13.7 12.0 - 15.0 g/dL   HCT 40.8 36.0 - 46.0 %   MCV 96.0 78.0 - 100.0 fL   MCH 32.2 26.0 - 34.0 pg   MCHC 33.6 30.0 - 36.0 g/dL   RDW 14.8 11.5 - 15.5 %   Platelets 164 150 - 400 K/uL   Neutrophils Relative % 84 %   Neutro Abs 5.3 1.7 - 7.7 K/uL   Lymphocytes Relative 9 %   Lymphs Abs 0.6 (L) 0.7 - 4.0 K/uL   Monocytes Relative 7 %   Monocytes Absolute 0.4 0.1 - 1.0 K/uL   Eosinophils Relative 0 %   Eosinophils Absolute 0.0 0.0 - 0.7 K/uL   Basophils Relative 0 %   Basophils Absolute 0.0 0.0 - 0.1  K/uL    Comment:  Performed at [facility], [place] [address] [place], [place] [address]  Magnesium     Status: None   Collection Time: 04/09/18  8:32 AM  Result Value Ref Range   Magnesium 2.0 1.7 - 2.4 mg/dL    Comment: Performed at [facility], [place] [address], [place], [place] [address]  Urinalysis, Routine w reflex microscopic     Status: Abnormal   Collection Time: 04/09/18  9:18 AM  Result Value Ref Range   Color, Urine YELLOW YELLOW   APPearance HAZY (A) CLEAR   Specific Gravity, Urine 1.008 1.005 - 1.030   pH 8.0 5.0 - 8.0   Glucose, UA NEGATIVE NEGATIVE mg/dL   Hgb urine dipstick MODERATE (A) NEGATIVE   Bilirubin Urine NEGATIVE NEGATIVE   Ketones, ur 5 (A) NEGATIVE mg/dL   Protein, ur NEGATIVE NEGATIVE mg/dL   Nitrite NEGATIVE NEGATIVE   Leukocytes, UA SMALL (A) NEGATIVE   RBC / HPF 11-20 0 - 5 RBC/hpf   WBC, UA 6-10 0 - 5 WBC/hpf   Bacteria, UA RARE (A) NONE SEEN    Comment: Performed at [facility], [place] [address], [place], [place] [address]   Dg Lumbar Spine Complete  Result Date: 04/09/2018 CLINICAL DATA:  Status post fall EXAM: LUMBAR SPINE - COMPLETE 4+ VIEW COMPARISON:  None. FINDINGS: Chronic L4 vertebral body compression fracture with approximately 50% height loss. L3 vertebral body compression fracture with mild progressive height loss compared with 02/09/2016 concerning for acute on subacute compression fracture. Degenerative disc disease with disc height loss at L1-2 and L2-3. Minimal retrolisthesis of L2 on L3. Severe bilateral facet arthropathy at L4-5 and L5-S1. IMPRESSION: L3 vertebral body compression fracture with mild progressive height loss compared with 02/09/2016 concerning for acute on subacute compression fracture. Electronically Signed   By: Kathreen Devoid   On: 04/09/2018 09:30   Ct Abdomen Pelvis W Contrast  Result Date: 04/09/2018 CLINICAL DATA:  Lower back pain after fall yesterday. EXAM: CT ABDOMEN AND PELVIS  WITH CONTRAST TECHNIQUE: Multidetector CT imaging of the abdomen and pelvis was performed using the standard protocol following bolus administration of intravenous contrast. CONTRAST:  117m ISOVUE-300 IOPAMIDOL (ISOVUE-300) INJECTION 61% COMPARISON:  None. FINDINGS: Lower chest: No acute abnormality. Hepatobiliary: No gallstones are noted. Mildly nodular hepatic contours are noted suggesting hepatic cirrhosis. 1 cm enhancing abnormality is noted in left hepatic lobe. No biliary dilatation is noted. Pancreas: Unremarkable. No pancreatic ductal dilatation or surrounding inflammatory changes. Spleen: Normal in size without focal abnormality. Adrenals/Urinary Tract: Adrenal glands are unremarkable. Kidneys are normal, without renal calculi, focal lesion, or hydronephrosis. Bladder is unremarkable. Stomach/Bowel: Stomach is within normal limits. Appendix appears normal. No evidence of bowel wall thickening, distention, or inflammatory changes. Sigmoid diverticulosis is noted without inflammation. Vascular/Lymphatic: Aortic atherosclerosis. No enlarged abdominal or pelvic lymph nodes. Reproductive: Status post hysterectomy. No adnexal masses. Other: No abdominal wall hernia or abnormality. No abdominopelvic ascites. Musculoskeletal: No acute or significant osseous findings. IMPRESSION: Mildly nodular hepatic contours are noted concerning for cirrhosis. 1 cm enhancing abnormality is noted in left hepatic lobe; this is not diagnostic for hemangioma on the basis of this exam, and further evaluation with MRI of the liver with and without gadolinium is recommended to rule out other pathology. Sigmoid diverticulosis without inflammation. No evidence of traumatic injury seen in the abdomen or pelvis. Aortic Atherosclerosis (ICD10-I70.0). Electronically Signed   By: JMarijo Conception M.D.   On: 04/09/2018  11:56    Pending Labs Unresulted Labs (From admission, onward)   Start     Ordered   04/10/18 0500  Comprehensive  metabolic panel  Tomorrow morning,   R     04/09/18 1406   04/10/18 0500  CBC  Tomorrow morning,   R     04/09/18 1406      Vitals/Pain Today's Vitals   04/09/18 1300 04/09/18 1402 04/09/18 1430 04/09/18 1529  BP: (!) 158/83 (!) 155/87 (!) 150/79 (!) 152/76  Pulse: 60 61 60 65  Resp: 17   18  Temp:      TempSrc:      SpO2: 99% 97% 95% 96%  Weight:      Height:      PainSc:        Isolation Precautions No active isolations  Medications Medications  iopamidol (ISOVUE-300) 61 % injection (has no administration in time range)  0.9 %  sodium chloride infusion (has no administration in time range)  acetaminophen (TYLENOL) tablet 650 mg (has no administration in time range)    Or  acetaminophen (TYLENOL) suppository 650 mg (has no administration in time range)  oxyCODONE (Oxy IR/ROXICODONE) immediate release tablet 5 mg (has no administration in time range)  ondansetron (ZOFRAN) tablet 4 mg (has no administration in time range)    Or  ondansetron (ZOFRAN) injection 4 mg (has no administration in time range)  polyethylene glycol (MIRALAX / GLYCOLAX) packet 17 g (has no administration in time range)  levalbuterol (XOPENEX) nebulizer solution 0.63 mg (has no administration in time range)  fentaNYL (SUBLIMAZE) injection 25 mcg (has no administration in time range)  fentaNYL (SUBLIMAZE) injection 100 mcg (100 mcg Intravenous Given 04/09/18 0915)  fentaNYL (SUBLIMAZE) injection 100 mcg (100 mcg Intravenous Given 04/09/18 1115)  iopamidol (ISOVUE-300) 61 % injection 100 mL (100 mLs Intravenous Contrast Given 04/09/18 1130)  morphine 2 MG/ML injection 2 mg (2 mg Intravenous Given 04/09/18 1400)  metoCLOPramide (REGLAN) injection 5 mg (5 mg Intravenous Given 04/09/18 1400)    Mobility walks with device

## 2018-04-09 NOTE — ED Notes (Signed)
ED Provider at bedside. EDP J AT BEDSIDE 

## 2018-04-09 NOTE — ED Notes (Signed)
Bed: WA23 Expected date:  Expected time:  Means of arrival:  Comments: EMS-fall 

## 2018-04-09 NOTE — ED Notes (Addendum)
Patient transported to XR. 

## 2018-04-09 NOTE — Care Management Note (Signed)
Case Management Note  CM noted pt's reason for being placed in OBS and insurance provider.  CM did not note pt being obviously active with a [facility] agency on chart review.  CM contacted Georgina Snell with [facility] to start evaluation process for the [facility].  Pt will need RN PT OT NA SW and SLP (due to dementia) orders.  CM will continue to follow.

## 2018-04-09 NOTE — ED Notes (Signed)
HUSBAND STATES THIS PAST Sunday PT FELT DIZZY AND FAINT LIKE HOWEVER DID NOT FAINT. PT STATES SHE DID NOT FEEL THIS WAY DURING THIS EVENT.

## 2018-04-09 NOTE — ED Triage Notes (Signed)
Per PTAR, Pt resides at home with husband. Mechanical fall witnessed by husband. Landed on butt. Fall yesterday. Denies neck pain. C/o lower back pain. Vicodin 5/325 mg PO (RX ) last taken this 4am. Some relief however pain has returned. Neuro intact. Ambulatory on scene. Denies loss of bowel or bladder. No other complaints

## 2018-04-09 NOTE — ED Notes (Signed)
Patient transported to CT 

## 2018-04-09 NOTE — H&P (Signed)
[facility] History and Physical  Kayla Chambers UYQ:[id number] DOB: 11-19-1927 DOA: 04/09/2018  Referring physician:fall  PCP: Donald Prose, MD   Chief Complaint:    HPI:  84 year old female with a history of aortic insufficiency, aortic valve replacement, atrial fibrillation on Eliquis, hypothyroidism,brought in by her husband today after a fall and low back pain. Patient presented with a mechanical fall and witnessed by her husband, when she lost her balance and fell backwards and landed on her buttock.she denied any chest pain palpitations lightheadedness prior to the fall. She developed low back pain not relieved by Vicodin. Husband states that she had a spontaneous vertebral compression fracture last year which was not preceded by trauma . She is prone to having fractures. She denies any loss of bowel or bladder control.she denies loss of consciousness or trauma to her head. ED course BP (!) 156/87 (BP Location: Right Arm)   Pulse 63   Temp 97.8 F (36.6 C) (Oral)   Resp 15   Ht 5\' 5"  (1.651 m)   Wt 60.3 kg (133 lb)   SpO2 98%   BMI 22.13 kg/m Patient found to have acute compression fracture of L3 vertebrae Patient given IV fentanyl and IV morphine for pain control, she became nauseous after receiving IV morphine ED attempted to put her in a brace however she did not tolerate this well Patient is being admitted for pain control       Review of Systems: negative for the following  Pertinent positives as documented in history of present illness    Past Medical History:  Diagnosis Date  . Anxiety   . Aortic insufficiency    a. 03/2013 s/p  AVR w/ biologic 24mm Magna Ease peric tissue valve, model 300TFX, ser# [id number].   . Back pain    BACK BRACE/FRACTURE  . CAD (coronary artery disease)    a. 01/2013 mod calcification w/o sev dzs;  b. 03/2013 CABG x1 VG->RCA @ time of AVR  . Chronic diastolic CHF (congestive heart failure) ([place])    a. 01/2013 echo: EF 50-55%.   . Dementia   . Dilated aortic root ([place])    a. 03/2013 s/p [place] procedure with Ao Root replacement (33mm Valsalva graft) w/ reimplantationof the R and L coronary ostium.  . Fibromyalgia   . Headache(784.0)    hx migraines  . Hypothyroidism   . Intracranial aneurysm    in late 1970's  . 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 Neoadjuvant RT by Dr. Yisroel Ramming. Surveillance plan: MRI 12/08/11 shows changes in presumed post-op seroma/hematoma with slight increase in size - follow up MRI 11/18/12 showed same size. Will continue with q3 month MRI. CT C/A/P annually alternating with CXR at 6 months.   . Osteoporosis   . Paroxysmal A-fib ([place])    a. Dx 01/2013 - placed on xarelto;  b. 03/2013 s/p left sided MAZE and LA clip @ time of AVR/CABG.  . Peripheral neuropathy   . PMR (polymyalgia rheumatica) (HCC)    Inactive  . Rheumatic heart disease   . 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 Vertigo      Past Surgical History:  Procedure Laterality Date  . ABDOMINAL HYSTERECTOMY    . ASCENDING AORTIC ROOT REPLACEMENT N/A 03/31/2013   Procedure: ASCENDING AORTIC ROOT REPLACEMENT;  Surgeon: Grace Isaac, MD;  Location: [place];  Service: Open Heart Surgery;  Laterality: N/A;  . BLADDER SUSPENSION    . CARDIOVERSION N/A 01/29/2017   Procedure: CARDIOVERSION;  Surgeon: Jerline Pain, MD;  Location: [place];  Service: Cardiovascular;  Laterality: N/A;  . CARDIOVERSION N/A 10/22/2017   Procedure: CARDIOVERSION;  Surgeon: Lelon Perla, MD;  Location[facility] ENDOSCOPY;  Service: Cardiovascular;  Laterality: N/A;  . COLONOSCOPY  10/25/2011   Procedure: COLONOSCOPY;  Surgeon: Garlan Fair, MD;  Location: [facility] ENDOSCOPY;  Service: Endoscopy;  Laterality: N/A;  . EYE SURGERY Bilateral    cataracts  . INTRAOPERATIVE TRANSESOPHAGEAL ECHOCARDIOGRAM N/A 03/31/2013    Procedure: INTRAOPERATIVE TRANSESOPHAGEAL ECHOCARDIOGRAM;  Surgeon: Grace Isaac, MD;  Location: [place];  Service: Open Heart Surgery;  Laterality: N/A;  . LEFT AND RIGHT HEART CATHETERIZATION WITH CORONARY ANGIOGRAM Right 02/02/2013   Procedure: LEFT AND RIGHT HEART CATHETERIZATION WITH CORONARY ANGIOGRAM;  Surgeon: Hillary Bow, MD;  Location: [facility] CATH LAB;  Service: Cardiovascular;  Laterality: Right;  Marland Kitchen MAZE N/A 03/31/2013   Procedure: MAZE;  Surgeon: Grace Isaac, MD;  Location: [place];  Service: Open Heart Surgery;  Laterality: N/A;  . Sarcoma resection  2011      Social History:  reports that she has never smoked. She has never used smokeless tobacco. She reports that she does not drink alcohol or use drugs.    Allergies  Allergen Reactions  . Alendronate Sodium Nausea And Vomiting  . Ambien [Zolpidem Tartrate]     hallucinations  . Lac [place] Nausea Only    Drinks soy milk  . Lyrica [Pregabalin] Nausea And Vomiting  . Milk-Related Compounds Diarrhea  . Neurontin [Gabapentin] Nausea And Vomiting  . Procaine Hcl Other (See Comments)    Reaction unknown  . Toprol Xl [Metoprolol Succinate] Other (See Comments)    Does not remember. Tolerates Lopressor 04/10/13, [name]    Family History  Problem Relation Age of Onset  . Heart disease Mother   . Stroke Mother   . Arthritis Father   . Cancer Father   . Anesthesia problems Brother   . Hypertension Sister         Prior to Admission medications   Medication Sig Start Date End Date Taking? Authorizing Provider  ALPRAZolam Duanne Moron) 0.5 MG tablet Take 1 tablet (0.5 mg total) by mouth at bedtime as needed for anxiety (for sleep). 02/14/18  Yes Lelon Perla, MD  amiodarone (PACERONE) 200 MG tablet Take 1 tablet (200 mg total) by mouth daily. 02/14/18  Yes Lelon Perla, MD  apixaban (ELIQUIS) 5 MG TABS tablet Take 1 tablet (5 mg total) by mouth 2 (two) times daily. 06/12/17  Yes Lelon Perla, MD  Ascorbic Acid  (VITAMIN C PO) Take 1 tablet by mouth daily as needed (FOR IMMUNE SYSTEM BOOST).    Yes [provider]  Calcium Carbonate-Vitamin D (CALCIUM + D PO) Take 1 tablet by mouth daily. CALCIBOOST   Yes [provider]  Cholecalciferol (VITAMIN D3) 2000 units capsule Take 2,000 Units by mouth daily.   Yes [provider]  CRANBERRY PO Take 1 tablet by mouth 2 (two) times daily as needed (FOR BLADDER/URINARY ISSUES.).    Yes [provider]  furosemide (LASIX) 40 MG tablet Take 40 mg by mouth daily.   Yes [provider]  lactase (LACTAID) 3000 UNITS tablet Take 1 tablet by mouth daily as needed (only when eating dairy).   Yes [provider]  levothyroxine (SYNTHROID, LEVOTHROID) 75 MCG tablet  Take 75 mcg by mouth at bedtime.    Yes [provider]  Loperamide HCl (LOPERAMIDE A-D PO) Take 1 tablet by mouth 4 (four) times daily as needed (diarrhea/loose stools.).    Yes [provider]  Multiple Vitamin (MULTIVITAMIN WITH MINERALS) TABS Take 1 tablet by mouth daily.   Yes [provider]  naproxen sodium (ALEVE) 220 MG tablet Take 220 mg by mouth 2 (two) times daily as needed (for pain.).   Yes [provider]  potassium chloride SA (K-DUR,KLOR-CON) 20 MEQ tablet Take 1 tablet (20 mEq total) by mouth daily. 10/17/17  Yes Lelon Perla, MD  pravastatin (PRAVACHOL) 40 MG tablet take 1 tablet by mouth every evening Patient taking differently: TAKE 1 TABLET (40 MG) BY MOUTH ONCE DAILY IN THE EVENING. 04/16/17  Yes Crenshaw, Denice Bors, MD  Probiotic Product (PROBIOTIC PO) Take 1 capsule by mouth daily as needed ([place].).   Yes [provider]  rOPINIRole (REQUIP) 0.25 MG tablet Take 0.25 mg by mouth 3 (three) times daily as needed (for restless leg syndrome.).   Yes [provider]     Physical Exam: Vitals:   04/09/18 1100 04/09/18 1300 04/09/18 1402 04/09/18 1430  BP: (!) 160/78 (!)  158/83 (!) 155/87 (!) 150/79  Pulse: 62 60 61 60  Resp: 16 17    Temp:      TempSrc:      SpO2: 100% 99% 97% 95%  Weight:      Height:            Vitals:   04/09/18 1100 04/09/18 1300 04/09/18 1402 04/09/18 1430  BP: (!) 160/78 (!) 158/83 (!) 155/87 (!) 150/79  Pulse: 62 60 61 60  Resp: 16 17    Temp:      TempSrc:      SpO2: 100% 99% 97% 95%  Weight:      Height:       Constitutional:  Appears very uncomfortable Eyes: PERRL, lids and conjunctivae normal ENMT: Mucous membranes are moist. Posterior pharynx clear of any exudate or lesions.Normal dentition.  Neck: normal, supple, no masses, no thyromegaly Respiratory: clear to auscultation bilaterally, no wheezing, no crackles. Normal respiratory effort. No accessory muscle use.  Cardiovascular: Regular rate and rhythm, no murmurs / rubs / gallops. No extremity edema. 2+ pedal pulses. No carotid bruits.  Abdomen: no tenderness, no masses palpated. No hepatosplenomegaly. Bowel sounds positive.  Musculoskeletal: she has point tenderness of the lumbar spine. Decreased range of motion because of pain.no clubbing / cyanosis. No joint deformity upper and lower extremities. Good ROM, no contractures. Normal muscle tone.  Skin: no rashes, lesions, ulcers. No induration Neurologic: CN 2-12 grossly intact. Sensation intact, DTR normal. Strength 5/5 in all 4.  Psychiatric: Normal judgment and insight. Alert and oriented x  Husband and place. Normal mood.     Labs on Admission: I have personally reviewed following labs and imaging studies  CBC: Recent Labs  Lab 04/09/18 0832  WBC 6.3  NEUTROABS 5.3  HGB 13.7  HCT 40.8  MCV 96.0  PLT 194    Basic Metabolic Panel: Recent Labs  Lab 04/09/18 0832  NA 138  K 3.7  CL 103  CO2 24  GLUCOSE 102*  BUN 12  CREATININE 0.72  CALCIUM 9.2  MG 2.0    GFR: Estimated Creatinine Clearance: 43.7 mL/min (by C-G formula based on SCr of 0.72 mg/dL).  Liver Function Tests: Recent Labs   Lab 04/09/18 [phone]  AST 32  ALT 30  ALKPHOS 87  BILITOT 0.8  PROT 7.4  ALBUMIN 4.1   No results for input(s): LIPASE, AMYLASE in the last 168 hours. No results for input(s): AMMONIA in the last 168 hours.  Coagulation Profile: No results for input(s): INR, PROTIME in the last 168 hours. No results for input(s): DDIMER in the last 72 hours.  Cardiac Enzymes: No results for input(s): CKTOTAL, CKMB, CKMBINDEX, TROPONINI in the last 168 hours.  BNP (last 3 results) No results for input(s): PROBNP in the last 8760 hours.  HbA1C: No results for input(s): HGBA1C in the last 72 hours. Lab Results  Component Value Date   HGBA1C 5.1 03/26/2013     CBG: No results for input(s): GLUCAP in the last 168 hours.  Lipid Profile: No results for input(s): CHOL, HDL, LDLCALC, TRIG, CHOLHDL, LDLDIRECT in the last 72 hours.  Thyroid Function Tests: No results for input(s): TSH, T4TOTAL, FREET4, T3FREE, THYROIDAB in the last 72 hours.  Anemia Panel: No results for input(s): VITAMINB12, FOLATE, FERRITIN, TIBC, IRON, RETICCTPCT in the last 72 hours.  Urine analysis:    Component Value Date/Time   COLORURINE YELLOW 04/09/2018 0918   APPEARANCEUR HAZY (A) 04/09/2018 0918   LABSPEC 1.008 04/09/2018 0918   PHURINE 8.0 04/09/2018 0918   GLUCOSEU NEGATIVE 04/09/2018 0918   HGBUR MODERATE (A) 04/09/2018 0918   BILIRUBINUR NEGATIVE 04/09/2018 0918   KETONESUR 5 (A) 04/09/2018 0918   PROTEINUR NEGATIVE 04/09/2018 0918   UROBILINOGEN 0.2 03/26/2013 1330   NITRITE NEGATIVE 04/09/2018 0918   LEUKOCYTESUR SMALL (A) 04/09/2018 0918    Sepsis Labs: @LABRCNTIP (procalcitonin:4,lacticidven:4) )No results found for this or any previous visit (from the past 240 hour(s)).       Radiological Exams on Admission: Dg Lumbar Spine Complete  Result Date: 04/09/2018 CLINICAL DATA:  Status post fall EXAM: LUMBAR SPINE - COMPLETE 4+ VIEW COMPARISON:  None. FINDINGS: Chronic L4 vertebral body  compression fracture with approximately 50% height loss. L3 vertebral body compression fracture with mild progressive height loss compared with 02/09/2016 concerning for acute on subacute compression fracture. Degenerative disc disease with disc height loss at L1-2 and L2-3. Minimal retrolisthesis of L2 on L3. Severe bilateral facet arthropathy at L4-5 and L5-S1. IMPRESSION: L3 vertebral body compression fracture with mild progressive height loss compared with 02/09/2016 concerning for acute on subacute compression fracture. Electronically Signed   By: Kathreen Devoid   On: 04/09/2018 09:30   Ct Abdomen Pelvis W Contrast  Result Date: 04/09/2018 CLINICAL DATA:  Lower back pain after fall yesterday. EXAM: CT ABDOMEN AND PELVIS WITH CONTRAST TECHNIQUE: Multidetector CT imaging of the abdomen and pelvis was performed using the standard protocol following bolus administration of intravenous contrast. CONTRAST:  140mL ISOVUE-300 IOPAMIDOL (ISOVUE-300) INJECTION 61% COMPARISON:  None. FINDINGS: Lower chest: No acute abnormality. Hepatobiliary: No gallstones are noted. Mildly nodular hepatic contours are noted suggesting hepatic cirrhosis. 1 cm enhancing abnormality is noted in left hepatic lobe. No biliary dilatation is noted. Pancreas: Unremarkable. No pancreatic ductal dilatation or surrounding inflammatory changes. Spleen: Normal in size without focal abnormality. Adrenals/Urinary Tract: Adrenal glands are unremarkable. Kidneys are normal, without renal calculi, focal lesion, or hydronephrosis. Bladder is unremarkable. Stomach/Bowel: Stomach is within normal limits. Appendix appears normal. No evidence of bowel wall thickening, distention, or inflammatory changes. Sigmoid diverticulosis is noted without inflammation. Vascular/Lymphatic: Aortic atherosclerosis. No enlarged abdominal or pelvic lymph nodes. Reproductive: Status post hysterectomy. No adnexal masses. Other: No abdominal wall hernia or abnormality. No  abdominopelvic  ascites. Musculoskeletal: No acute or significant osseous findings. IMPRESSION: Mildly nodular hepatic contours are noted concerning for cirrhosis. 1 cm enhancing abnormality is noted in left hepatic lobe; this is not diagnostic for hemangioma on the basis of this exam, and further evaluation with MRI of the liver with and without gadolinium is recommended to rule out other pathology. Sigmoid diverticulosis without inflammation. No evidence of traumatic injury seen in the abdomen or pelvis. Aortic Atherosclerosis (ICD10-I70.0). Electronically Signed   By: Marijo Conception, M.D.   On: 04/09/2018 11:56   Dg Lumbar Spine Complete  Result Date: 04/09/2018 CLINICAL DATA:  Status post fall EXAM: LUMBAR SPINE - COMPLETE 4+ VIEW COMPARISON:  None. FINDINGS: Chronic L4 vertebral body compression fracture with approximately 50% height loss. L3 vertebral body compression fracture with mild progressive height loss compared with 02/09/2016 concerning for acute on subacute compression fracture. Degenerative disc disease with disc height loss at L1-2 and L2-3. Minimal retrolisthesis of L2 on L3. Severe bilateral facet arthropathy at L4-5 and L5-S1. IMPRESSION: L3 vertebral body compression fracture with mild progressive height loss compared with 02/09/2016 concerning for acute on subacute compression fracture. Electronically Signed   By: Kathreen Devoid   On: 04/09/2018 09:30   Ct Abdomen Pelvis W Contrast  Result Date: 04/09/2018 CLINICAL DATA:  Lower back pain after fall yesterday. EXAM: CT ABDOMEN AND PELVIS WITH CONTRAST TECHNIQUE: Multidetector CT imaging of the abdomen and pelvis was performed using the standard protocol following bolus administration of intravenous contrast. CONTRAST:  147mL ISOVUE-300 IOPAMIDOL (ISOVUE-300) INJECTION 61% COMPARISON:  None. FINDINGS: Lower chest: No acute abnormality. Hepatobiliary: No gallstones are noted. Mildly nodular hepatic contours are noted suggesting hepatic  cirrhosis. 1 cm enhancing abnormality is noted in left hepatic lobe. No biliary dilatation is noted. Pancreas: Unremarkable. No pancreatic ductal dilatation or surrounding inflammatory changes. Spleen: Normal in size without focal abnormality. Adrenals/Urinary Tract: Adrenal glands are unremarkable. Kidneys are normal, without renal calculi, focal lesion, or hydronephrosis. Bladder is unremarkable. Stomach/Bowel: Stomach is within normal limits. Appendix appears normal. No evidence of bowel wall thickening, distention, or inflammatory changes. Sigmoid diverticulosis is noted without inflammation. Vascular/Lymphatic: Aortic atherosclerosis. No enlarged abdominal or pelvic lymph nodes. Reproductive: Status post hysterectomy. No adnexal masses. Other: No abdominal wall hernia or abnormality. No abdominopelvic ascites. Musculoskeletal: No acute or significant osseous findings. IMPRESSION: Mildly nodular hepatic contours are noted concerning for cirrhosis. 1 cm enhancing abnormality is noted in left hepatic lobe; this is not diagnostic for hemangioma on the basis of this exam, and further evaluation with MRI of the liver with and without gadolinium is recommended to rule out other pathology. Sigmoid diverticulosis without inflammation. No evidence of traumatic injury seen in the abdomen or pelvis. Aortic Atherosclerosis (ICD10-I70.0). Electronically Signed   By: Marijo Conception, M.D.   On: 04/09/2018 11:56      EKG: Independently reviewed. No voltage  sinus rhythm  Assessment/Plan Principal Problem:  L3 Vertebral fracture, osteoporotic, initial encounter [facility]) Patient admitted for pain control IR consult for possible vertebroplasty PT OT evaluation Consider changing to inpatient if the patient if requiring IV pain medications for pain control    Hypothyroidism-continue Synthroid    Disorder of liver, CT shows nodular liver, concerning for cirrhosis 1 cm abnormality noted in the left hepatic lobe,  follow-up MRI recommended    PAF (paroxysmal atrial fibrillation) (HCC)-in sinus rhythm, noted to be slightly bradycardic, will monitor on telemetry, continue amiodarone    HTN (hypertension)-continue Lasix,    Chronic diastolic CHF (  congestive heart failure) ([place]), currently euvolemic and stable. Most recent EF 60-65%, status post aVR/maze procedure      H/O aortic valve replacement- stable     DVT prophylaxis:  eliquis      Code Status Orders full code          consults called:  Family Communication: Admission, patients condition and plan of care including tests being ordered have been discussed with the patient  who indicates understanding and agree with the plan and Code Status   Admission status:  The appropriate patient status for this patient is INPATIENT. Inpatient status is judged to be reasonable and necessary in order to provide the required intensity of service to ensure the patient's safety. The patient's presenting symptoms, physical exam findings, and initial radiographic and laboratory data in the context of their chronic comorbidities is felt to place them at high risk for further clinical deterioration. Furthermore, it is not anticipated that the patient will be medically stable for discharge from the hospital within 2 midnights of admission. The following factors support the patient status of inpatient.    "           The patient's presenting symptoms include low back pain. "           The worrisome physical exam findings include and inability to walk. "           The initial radiographic and laboratory data are worrisome because of sacral fracture on CT. "           The chronic co-morbidities include history of hypertension.     * I certify that at the point of admission it is my clinical judgment that the patient will require inpatient hospital care spanning beyond 2 midnights from the point of admission due to high intensity of service, high risk for further  deterioration and high frequency of surveillance required.*    Disposition plan: Further plan will depend as patient's clinical course evolves and further radiologic and laboratory data become available. Likely home when stable    At the time of admission, it appears that the appropriate admission status for this patient is INPATIENT . This is judged to be reasonable and necessary in order to provide the required intensity of service to ensure the patient's safety given the presenting symptoms, physical exam findings, and initial radiographic and laboratory data in the context of their chronic comorbidities.   Reyne Dumas MD [facility] Pager [phone]  If 7PM-7AM, please contact night-coverage [email or web address] Password [facility]  04/09/2018, 2:48 PM

## 2018-04-10 DIAGNOSIS — M353 Polymyalgia rheumatica: Secondary | ICD-10-CM | POA: Diagnosis not present

## 2018-04-10 DIAGNOSIS — M8008XA Age-related osteoporosis with current pathological fracture, vertebra(e), initial encounter for fracture: Secondary | ICD-10-CM | POA: Diagnosis not present

## 2018-04-10 DIAGNOSIS — I10 Essential (primary) hypertension: Secondary | ICD-10-CM | POA: Diagnosis not present

## 2018-04-10 DIAGNOSIS — K769 Liver disease, unspecified: Secondary | ICD-10-CM

## 2018-04-10 DIAGNOSIS — I480 Paroxysmal atrial fibrillation: Secondary | ICD-10-CM | POA: Diagnosis not present

## 2018-04-10 DIAGNOSIS — I5032 Chronic diastolic (congestive) heart failure: Secondary | ICD-10-CM

## 2018-04-10 DIAGNOSIS — M4850XA Collapsed vertebra, not elsewhere classified, site unspecified, initial encounter for fracture: Secondary | ICD-10-CM | POA: Diagnosis not present

## 2018-04-10 DIAGNOSIS — Z952 Presence of prosthetic heart valve: Secondary | ICD-10-CM | POA: Diagnosis not present

## 2018-04-10 DIAGNOSIS — I2510 Atherosclerotic heart disease of native coronary artery without angina pectoris: Secondary | ICD-10-CM | POA: Diagnosis not present

## 2018-04-10 LAB — CBC
HEMATOCRIT: 40.5 % (ref 36.0–46.0)
HEMOGLOBIN: 13.6 g/dL (ref 12.0–15.0)
MCH: 31.9 pg (ref 26.0–34.0)
MCHC: 33.6 g/dL (ref 30.0–36.0)
MCV: 95.1 fL (ref 78.0–100.0)
Platelets: 166 10*3/uL (ref 150–400)
RBC: 4.26 MIL/uL (ref 3.87–5.11)
RDW: 14.7 % (ref 11.5–15.5)
WBC: 8 10*3/uL (ref 4.0–10.5)

## 2018-04-10 LAB — COMPREHENSIVE METABOLIC PANEL
ALK PHOS: 80 U/L (ref 38–126)
ALT: 26 U/L (ref 14–54)
AST: 30 U/L (ref 15–41)
Albumin: 3.9 g/dL (ref 3.5–5.0)
Anion gap: 11 (ref 5–15)
BUN: 8 mg/dL (ref 6–20)
CALCIUM: 8.7 mg/dL — AB (ref 8.9–10.3)
CHLORIDE: 102 mmol/L (ref 101–111)
CO2: 24 mmol/L (ref 22–32)
Creatinine, Ser: 0.56 mg/dL (ref 0.44–1.00)
GFR calc non Af Amer: >60 mL/min (ref >60)
GLUCOSE: 98 mg/dL (ref 65–99)
POTASSIUM: 3.3 mmol/L — AB (ref 3.5–5.1)
SODIUM: 137 mmol/L (ref 135–145)
Total Bilirubin: 0.7 mg/dL (ref 0.3–1.2)
Total Protein: 6.9 g/dL (ref 6.5–8.1)

## 2018-04-10 MED ORDER — APIXABAN 2.5 MG PO TABS: 2.5000 mg | ORAL_TABLET | Freq: Two times a day (BID) | ORAL | Status: DC

## 2018-04-10 MED ORDER — POLYETHYLENE GLYCOL 3350 17 G PO PACK: 17.0000 g | PACK | Freq: Every day | ORAL | 0 refills | Status: DC

## 2018-04-10 MED ORDER — APIXABAN 2.5 MG PO TABS: 2.5000 mg | ORAL_TABLET | Freq: Two times a day (BID) | ORAL | 0 refills | Status: DC

## 2018-04-10 MED ORDER — POTASSIUM CHLORIDE CRYS ER 20 MEQ PO TBCR
40.0000 meq | EXTENDED_RELEASE_TABLET | Freq: Two times a day (BID) | ORAL | Status: DC
  Administered 2018-04-10: 40 meq via ORAL
  Filled 2018-04-10: qty 2

## 2018-04-10 MED ORDER — ONDANSETRON HCL 4 MG PO TABS: 4.0000 mg | ORAL_TABLET | Freq: Four times a day (QID) | ORAL | 0 refills | Status: DC | PRN

## 2018-04-10 MED ORDER — OXYCODONE HCL 5 MG PO TABS: 5.0000 mg | ORAL_TABLET | ORAL | 0 refills | Status: DC | PRN

## 2018-04-10 NOTE — Evaluation (Signed)
Physical Therapy Evaluation Patient Details Name: Kayla Chambers MRN: [id number] DOB: 1927-06-14 Today's Date: 04/10/2018   History of Present Illness  Pt is an 84 year old female admitted for a vertebral fx after a fall. PMH: previous compression fx, AVR  Clinical Impression  Patient evaluated by Physical Therapy with no further acute PT needs identified. All education has been completed and the patient has no further questions.  Pt assisted with ambulating in hallway and recommended pt use RW at home for safety especially while healing.  Spouse present and aware.  Also reviewed some back precautions and log roll technique for comfort.  Spouse ready to assist pt home today. See below for any follow-up Physical Therapy or equipment needs. PT is signing off. Thank you for this referral.        Follow Up Recommendations Home health PT;Supervision/Assistance - 24 hour    Equipment Recommendations  Rolling walker with 5" wheels    Recommendations for Other Services       Precautions / Restrictions Precautions Precautions: Fall;Back Precaution Comments: discussed back precaution movements and log roll technique for healing and comfort Required Braces or Orthoses: Spinal Brace Spinal Brace: (husband removed upper piece, utilized lumbar portion) Restrictions Weight Bearing Restrictions: No      Mobility  Bed Mobility Overal bed mobility: Needs Assistance Bed Mobility: Supine to Sit;Sit to Supine Rolling: Min guard Sidelying to sit: Min assist   Sit to supine: Min assist   General bed mobility comments: assist for lower body, reviewed log roll technique, pt performed twice  Transfers Overall transfer level: Needs assistance Equipment used: Rolling walker (2 wheeled) Transfers: Sit to/from Stand Sit to Stand: Min guard         General transfer comment: verbal cues for safe technique  Ambulation/Gait Ambulation/Gait assistance: Min guard Ambulation Distance (Feet): 400  Feet Assistive device: Rolling walker (2 wheeled) Gait Pattern/deviations: Step-through pattern;Decreased stride length     General Gait Details: steady with RW and tolerated distance well, also went another 200 feet with HHA (spouse wished to see if pt could just use cane) however pt more unsteady so recommend pt utilize RW at home for safety  Stairs            Wheelchair Mobility    Modified Rankin (Stroke Patients Only)       Balance Overall balance assessment: History of Falls                                           Pertinent Vitals/Pain Pain Assessment: No/denies pain    Home Living Family/patient expects to be discharged to:: Private residence Living Arrangements: Spouse/significant other   Type of Home: House Home Access: Stairs to enter Entrance Stairs-Rails: Left Entrance Stairs-Number of Steps: 3 Home Layout: Able to live on main level with bedroom/bathroom Home Equipment: Bedside commode;Shower seat;Walker - 2 wheels;Cane - quad Additional Comments: bedroom is upstairs but pt can have a first floor set up.    Prior Function Level of Independence: Independent with assistive device(s)         Comments: pt usually uses cane; did not when she fell.  Husband introduced himself as pt's caregiver.  They have a daughter who is an OT in [place]     Hand Dominance        Extremity/Trunk Assessment   Upper Extremity Assessment Upper Extremity Assessment: Generalized weakness  Lower Extremity Assessment Lower Extremity Assessment: Generalized weakness(reports hx of peripheral neuropathy, no radiating symptoms or weakness more so then usual per pt)       Communication   Communication: No difficulties  Cognition Arousal/Alertness: Awake/alert Behavior During Therapy: WFL for tasks assessed/performed Overall Cognitive Status: Within Functional Limits for tasks assessed                                         General Comments      Exercises     Assessment/Plan    PT Assessment All further PT needs can be met in the next venue of care  PT Problem List Decreased strength;Decreased mobility;Decreased activity tolerance;Decreased balance;Decreased knowledge of use of DME;Decreased knowledge of precautions       PT Treatment Interventions      PT Goals (Current goals can be found in the Care Plan section)  Acute Rehab PT Goals Patient Stated Goal: home PT Goal Formulation: All assessment and education complete, DC therapy    Frequency     Barriers to discharge        Co-evaluation               AM-PAC PT "6 Clicks" Daily Activity  Outcome Measure Difficulty turning over in bed (including adjusting bedclothes, sheets and blankets)?: A Lot Difficulty moving from lying on back to sitting on the side of the bed? : Unable Difficulty sitting down on and standing up from a chair with arms (e.g., wheelchair, bedside commode, etc,.)?: Unable Help needed moving to and from a bed to chair (including a wheelchair)?: A Little Help needed walking in hospital room?: A Little Help needed climbing 3-5 steps with a railing? : A Little 6 Click Score: 13    End of Session Equipment Utilized During Treatment: Gait belt;Back brace Activity Tolerance: Patient tolerated treatment well Patient left: in bed;with call bell/phone within reach;with bed alarm set;with family/visitor present Nurse Communication: Mobility status PT Visit Diagnosis: Difficulty in walking, not elsewhere classified (R26.2);Unsteadiness on feet (R26.81)    Time: 1349-1410 PT Time Calculation (min) (ACUTE ONLY): 21 min   Charges:   PT Evaluation $PT Eval Low Complexity: 1 Low     PT G Codes:        Kati , PT, DPT 04/10/2018 Pager: [phone]  ,KATHrine E 04/10/2018, 3:15 PM   

## 2018-04-10 NOTE — Care Management Obs Status (Signed)
[place] NOTIFICATION   Patient Details  Name: Kayla Chambers MRN: [id number] Date of Birth: 12/05/1926   Medicare Observation Status Notification Given:  Yes    McGibboneyOletta Darter, RN 04/10/2018, 1:10 PM

## 2018-04-10 NOTE — Discharge Summary (Signed)
Physician Discharge Summary  Kayla Chambers FVC:[id number] DOB: 1926/12/08 DOA: 04/09/2018  PCP: Kayla Prose, MD  Admit date: 04/09/2018 Discharge date: 04/10/2018  Admitted From: Home Disposition: Home with Home Health PT/OT  Recommendations for Outpatient Follow-up:  1. Follow up with PCP in 1-2 weeks 2. Follow-up on possible liver cirrhosis and liver lesion and obtain MRI as an outpatient 3. Please obtain CMP/CBC, Mag, Phos in one week 4. Please follow up on the following pending results:  Home Health: YES Equipment/Devices: Conservation officer, nature with 5" Wheels  Discharge Condition: Stable CODE STATUS: FULL CODE Diet recommendation: Heart Healthy  Brief/Interim Summary: The patient is an 84 year old female with a history of aortic insufficiency, aortic valve replacement, atrial fibrillation on Eliquis, hypothyroidism, brought in by her husband yesterday after a fall and low back pain. Patient presented with a mechanical fall and witnessed by her husband, when she lost her balance and fell backwards and landed on her buttock. She denied any chest pain palpitations lightheadedness prior to the fall. She developed low back pain not relieved by Vicodin. Husband states that she had a spontaneous vertebral compression fracture last year which was not preceded by trauma. She is prone to having fractures. She denies any loss of bowel or bladder control.she denies loss of consciousness or trauma to her head.She was found to have an Acute Compression Fx of L3 Vertebrae and given IV Pain Control and admitted for Intractable Pain and Acute Compression Fracture.  Interventional radiology consult was placed however after discussion the patient she refused any procedures as she states her back felt better and refused vertebroplasty or kyphoplasty evaluation.  PT and OT work with the patient and she is deemed medically stable to go home with home health OT and PT.  Patient stated her pain is better controlled and  at this time will be discharged home with home health as she has refused kyphoplasty/vertebroplasty evaluation and her pain is better controlled.  Discharge Diagnoses:  Principal Problem:   Vertebral fracture, osteoporotic, initial encounter ([place]) Active Problems:   Hypothyroidism   Peripheral neuropathy   Disorder of liver   PAF (paroxysmal atrial fibrillation) (HCC)   HTN (hypertension)   Chronic diastolic CHF (congestive heart failure) (HCC)   CAD (coronary artery disease)   H/O aortic valve replacement   PMR (polymyalgia rheumatica) (HCC)   Compression fracture of vertebrae (HCC)  L3 Vertebral fracture, osteoporotic, initial encounter [facility]) -Patient admitted for pain control -IR consult for possible vertebroplasty but will cancel as patient is adamantly refusing evaluation and states that she will not proceed with a kyphoplasty/vertebroplasty even if she was a candidate -Was going to obtain a nuclear medicine bone scan as well but patient does not want to do that as she states her back pain is better -PT OT evaluation recommending home health PT -Continue oxycodone p.o. as patient states her back pain is improved -Recommended brace however patient is not very compliant with the back brace -Follow-up with PCP in outpatient setting  Hypothyroidism -continue Synthroid  Disorder of liver, CT shows nodular liver, concerning for cirrhosis 1 cm abnormality noted in the left hepatic lobe -Follow-up MRI recommended and can be done as an outpatient   PAF (paroxysmal atrial fibrillation) (HCC) -In sinus rhythm, noted to be slightly bradycardic,  -Continue to Monitor on telemetry -Continue Home Amiodarone -Was on hold Eliquis in anticipation for possible procedure for vertebroplasty however patient has refused vertebroplasty/kyphoplasty under all circumstances so will be resuming apixaban a lower dose  HTN (hypertension) -  Continue Home Lasix  Chronic diastolic CHF (congestive  heart failure) ([place]), currently euvolemic and stable.  -Most recent EF 60-65% -status post aVR/maze procedure -Continue home medications follow-up with cardiology as an outpatient  H/O aortic valve replacement -Stable -Was going to hold her anticoagulation with Eliquis for anticipated possible vertebral plasty evaluation however patient refused so we will restart and discharged home on a lower dose of Eliquis given her age and creatinine  Discharge Instructions Discharge Instructions    Call MD for:  difficulty breathing, headache or visual disturbances   Complete by:  As directed    Call MD for:  extreme fatigue   Complete by:  As directed    Call MD for:  hives   Complete by:  As directed    Call MD for:  persistant dizziness or light-headedness   Complete by:  As directed    Call MD for:  persistant nausea and vomiting   Complete by:  As directed    Call MD for:  redness, tenderness, or signs of infection (pain, swelling, redness, odor or green/yellow discharge around incision site)   Complete by:  As directed    Call MD for:  severe uncontrolled pain   Complete by:  As directed    Call MD for:  temperature >100.4   Complete by:  As directed    Diet - low sodium heart healthy   Complete by:  As directed    Discharge instructions   Complete by:  As directed    Follow-up with primary care physician as well as cardiology in outpatient setting.  Take all medications as prescribed.  If symptoms change or worsen please return to the emergency room for evaluation.   Increase activity slowly   Complete by:  As directed      Allergies as of 04/10/2018      Reactions   Alendronate Sodium Nausea And Vomiting   Ambien [zolpidem Tartrate]    hallucinations   Lac Bovis Nausea Only   Drinks soy milk   Lyrica [pregabalin] Nausea And Vomiting   Milk-related Compounds Diarrhea   Neurontin [gabapentin] Nausea And Vomiting   Procaine Hcl Other (See Comments)   Reaction unknown   Toprol  Xl [metoprolol Succinate] Other (See Comments)   Does not remember. Tolerates Lopressor 04/10/13, [name]      Medication List    TAKE these medications   ALPRAZolam 0.5 MG tablet Commonly known as:  XANAX Take 1 tablet (0.5 mg total) by mouth at bedtime as needed for anxiety (for sleep).   amiodarone 200 MG tablet Commonly known as:  PACERONE Take 1 tablet (200 mg total) by mouth daily.   apixaban 2.5 MG Tabs tablet Commonly known as:  ELIQUIS Take 1 tablet (2.5 mg total) by mouth 2 (two) times daily. What changed:    medication strength  how much to take   CALCIUM + D PO Take 1 tablet by mouth daily. CALCIBOOST   CRANBERRY PO Take 1 tablet by mouth 2 (two) times daily as needed (FOR BLADDER/URINARY ISSUES.).   furosemide 40 MG tablet Commonly known as:  LASIX Take 40 mg by mouth daily.   lactase 3000 units tablet Commonly known as:  LACTAID Take 1 tablet by mouth daily as needed (only when eating dairy).   levothyroxine 75 MCG tablet Commonly known as:  SYNTHROID, LEVOTHROID Take 75 mcg by mouth at bedtime.   LOPERAMIDE A-D PO Take 1 tablet by mouth 4 (four) times daily as needed (diarrhea/loose stools.).  multivitamin with minerals Tabs tablet Take 1 tablet by mouth daily.   naproxen sodium 220 MG tablet Commonly known as:  ALEVE Take 220 mg by mouth 2 (two) times daily as needed (for pain.).   ondansetron 4 MG tablet Commonly known as:  ZOFRAN Take 1 tablet (4 mg total) by mouth every 6 (six) hours as needed for nausea.   oxyCODONE 5 MG immediate release tablet Commonly known as:  Oxy IR/ROXICODONE Take 1 tablet (5 mg total) by mouth every 4 (four) hours as needed for moderate pain.   polyethylene glycol packet Commonly known as:  MIRALAX / GLYCOLAX Take 17 g by mouth daily.   potassium chloride SA 20 MEQ tablet Commonly known as:  K-DUR,KLOR-CON Take 1 tablet (20 mEq total) by mouth daily.   pravastatin 40 MG tablet Commonly known as:   PRAVACHOL take 1 tablet by mouth every evening   PROBIOTIC PO Take 1 capsule by mouth daily as needed (FOR DIGESTIVE HEALTH.).   rOPINIRole 0.25 MG tablet Commonly known as:  REQUIP Take 0.25 mg by mouth at bedtime as needed (for restless leg syndrome.).   VITAMIN C PO Take 1 tablet by mouth daily as needed (FOR IMMUNE SYSTEM BOOST).   Vitamin D3 2000 units capsule Take 2,000 Units by mouth daily.       Allergies  Allergen Reactions  . Alendronate Sodium Nausea And Vomiting  . Ambien [Zolpidem Tartrate]     hallucinations  . Lac Bovis Nausea Only    Drinks soy milk  . Lyrica [Pregabalin] Nausea And Vomiting  . Milk-Related Compounds Diarrhea  . Neurontin [Gabapentin] Nausea And Vomiting  . Procaine Hcl Other (See Comments)    Reaction unknown  . Toprol Xl [Metoprolol Succinate] Other (See Comments)    Does not remember. Tolerates Lopressor 04/10/13, [name]    Consultations:  IR but consult cancelled as patient adamantly refused any evaluation for Kyphoplasty  Procedures/Studies: Dg Lumbar Spine Complete  Result Date: 04/09/2018 CLINICAL DATA:  Status post fall EXAM: LUMBAR SPINE - COMPLETE 4+ VIEW COMPARISON:  None. FINDINGS: Chronic L4 vertebral body compression fracture with approximately 50% height loss. L3 vertebral body compression fracture with mild progressive height loss compared with 02/09/2016 concerning for acute on subacute compression fracture. Degenerative disc disease with disc height loss at L1-2 and L2-3. Minimal retrolisthesis of L2 on L3. Severe bilateral facet arthropathy at L4-5 and L5-S1. IMPRESSION: L3 vertebral body compression fracture with mild progressive height loss compared with 02/09/2016 concerning for acute on subacute compression fracture. Electronically Signed   By: Kathreen Devoid   On: 04/09/2018 09:30   Mr Lumbar Spine Wo Contrast  Result Date: 04/09/2018 CLINICAL DATA:  Initial evaluation for acute trauma, fall. Evaluate for possible  fracture. EXAM: MRI LUMBAR SPINE WITHOUT CONTRAST TECHNIQUE: Multiplanar, multisequence MR imaging of the lumbar spine was performed. No intravenous contrast was administered. COMPARISON:  Prior CT and radiograph from earlier same day. FINDINGS: Segmentation: Normal segmentation. Lowest well-formed disc labeled the L5-S1 level. Alignment: Mild dextroscoliosis. 6 mm retrolisthesis of L2 on L3, with trace 2-3 mm retrolisthesis of L3 on L4 and L4 on L5. Vertebrae: Linear T1 hypointense, T2/STIR hyperintense signal intensity extending through the mid/upper aspect of the L3 vertebral body, consistent with acute compression fracture. Mild central height loss of 30% without significant bony retropulsion. This is benign/mechanical in appearance with no underlying pathologic lesion. Compression deformity involving the L4 vertebral body age is chronic in appearance, relatively similar as compared to previous CT from 01/02/2016.  Mild edema within the L4 vertebral body felt to be related to prominent inferior endplate Schmorl's node. Vertebral body heights otherwise maintained without evidence for acute or chronic fracture. Trace edema within the S3 segment felt to be either degenerative in nature or possibly related to atypical hemangioma. Underlying bone marrow signal intensity diffusely heterogeneous without worrisome osseous mass. Conus medullaris and cauda equina: Conus extends to the T12-L1 level. Conus and cauda equina appear normal. Paraspinal and other soft tissues: Paraspinous soft tissues demonstrate no acute abnormality. Chronic atrophy noted within the lower paraspinous musculature. Subcentimeter T2 hyperintense cyst noted within the posterior left kidney. Visualized visceral structures otherwise unremarkable. Disc levels: L1-2: Mild diffuse disc bulge. Superimposed tiny left paracentral disc extrusion with superior migration. No significant canal or foraminal stenosis. L2-3: 6 mm retrolisthesis. Mild diffuse disc  bulge. Moderate facet and ligament flavum hypertrophy. Resultant moderate left with mild right lateral recess narrowing without significant canal stenosis. Foramina remain patent. L3-4: Trace retrolisthesis. Diffuse disc bulge. Moderate facet and ligamentum flavum hypertrophy, slightly worse on the left. Resultant mild left lateral recess narrowing without significant canal stenosis. Mild left L3 foraminal stenosis. L4-5: 5 mm bony retropulsion related to the chronic L4 compression fracture. Mild diffuse disc bulge. Reactive endplate changes with endplate osteophytic spurring. Moderate to advanced facet and ligament flavum hypertrophy. Resultant moderate canal with bilateral subarticular stenosis. Moderate left with mild right L4 foraminal stenosis. L5-S1: Negative interspace. Mild to moderate facet hypertrophy, slightly worse on the left. No stenosis. IMPRESSION: 1. Acute compression fracture involving the upper/central L3 vertebral body with mild central height loss of up to 30% without bony retropulsion. 2. Chronic compression deformity of L4 with associated 5 mm bony retropulsion. 3. Multifactorial degenerative changes at L4-5 with resultant moderate canal and bilateral subarticular stenosis, with moderate left L4 foraminal narrowing. Electronically Signed   By: Jeannine Boga M.D.   On: 04/09/2018 19:56   Ct Abdomen Pelvis W Contrast  Result Date: 04/09/2018 CLINICAL DATA:  Lower back pain after fall yesterday. EXAM: CT ABDOMEN AND PELVIS WITH CONTRAST TECHNIQUE: Multidetector CT imaging of the abdomen and pelvis was performed using the standard protocol following bolus administration of intravenous contrast. CONTRAST:  162mL ISOVUE-300 IOPAMIDOL (ISOVUE-300) INJECTION 61% COMPARISON:  None. FINDINGS: Lower chest: No acute abnormality. Hepatobiliary: No gallstones are noted. Mildly nodular hepatic contours are noted suggesting hepatic cirrhosis. 1 cm enhancing abnormality is noted in left hepatic  lobe. No biliary dilatation is noted. Pancreas: Unremarkable. No pancreatic ductal dilatation or surrounding inflammatory changes. Spleen: Normal in size without focal abnormality. Adrenals/Urinary Tract: Adrenal glands are unremarkable. Kidneys are normal, without renal calculi, focal lesion, or hydronephrosis. Bladder is unremarkable. Stomach/Bowel: Stomach is within normal limits. Appendix appears normal. No evidence of bowel wall thickening, distention, or inflammatory changes. Sigmoid diverticulosis is noted without inflammation. Vascular/Lymphatic: Aortic atherosclerosis. No enlarged abdominal or pelvic lymph nodes. Reproductive: Status post hysterectomy. No adnexal masses. Other: No abdominal wall hernia or abnormality. No abdominopelvic ascites. Musculoskeletal: No acute or significant osseous findings. IMPRESSION: Mildly nodular hepatic contours are noted concerning for cirrhosis. 1 cm enhancing abnormality is noted in left hepatic lobe; this is not diagnostic for hemangioma on the basis of this exam, and further evaluation with MRI of the liver with and without gadolinium is recommended to rule out other pathology. Sigmoid diverticulosis without inflammation. No evidence of traumatic injury seen in the abdomen or pelvis. Aortic Atherosclerosis (ICD10-I70.0). Electronically Signed   By: Marijo Conception, M.D.   On: 04/09/2018 11:56  Subjective: Seen and examined at bedside states her back pain was better.  Did not want to go forward with a kyphoplasty or vertebroplasty to IR consult was canceled after discussion with the patient and her husband.  Denies chest pain, shortness breath, nausea, vomiting.  Felt well and wanting to go home.  Denies any other complaints or concerns at this time  Discharge Exam: Vitals:   04/09/18 2127 04/10/18 1500  BP: (!) 148/82 137/82  Pulse: 62 67  Resp: 18 18  Temp: 98.5 F (36.9 C) 98.7 F (37.1 C)  SpO2: 96% 96%   Vitals:   04/09/18 1719 04/09/18 1725  04/09/18 2127 04/10/18 1500  BP: (!) 155/83  (!) 148/82 137/82  Pulse: 67  62 67  Resp: 18  18 18   Temp: 98.1 F (36.7 C)  98.5 F (36.9 C) 98.7 F (37.1 C)  TempSrc: Oral  Oral Oral  SpO2: 96%  96% 96%  Weight:  58 kg (127 lb 13.9 oz)    Height:  5\' 5"  (1.651 m)     General: Pt is alert, awake, not in acute distress Cardiovascular: RRR, S1/S2 +, no rubs, no gallops; has a murmur Respiratory: CTA bilaterally, no wheezing, no rhonchi Abdominal: Soft, NT, ND, bowel sounds + Extremities: no edema, no cyanosis  The results of significant diagnostics from this hospitalization (including imaging, microbiology, ancillary and laboratory) are listed below for reference.    Microbiology: No results found for this or any previous visit (from the past 240 hour(s)).   Labs: BNP (last 3 results) No results for input(s): BNP in the last 8760 hours. Basic Metabolic Panel: Recent Labs  Lab 04/09/18 0832 04/10/18 0450  NA 138 137  K 3.7 3.3*  CL 103 102  CO2 24 24  GLUCOSE 102* 98  BUN 12 8  CREATININE 0.72 0.56  CALCIUM 9.2 8.7*  MG 2.0  --    Liver Function Tests: Recent Labs  Lab 04/09/18 0832 04/10/18 0450  AST 32 30  ALT 30 26  ALKPHOS 87 80  BILITOT 0.8 0.7  PROT 7.4 6.9  ALBUMIN 4.1 3.9   No results for input(s): LIPASE, AMYLASE in the last 168 hours. No results for input(s): AMMONIA in the last 168 hours. CBC: Recent Labs  Lab 04/09/18 0832 04/10/18 0450  WBC 6.3 8.0  NEUTROABS 5.3  --   HGB 13.7 13.6  HCT 40.8 40.5  MCV 96.0 95.1  PLT 164 166   Cardiac Enzymes: No results for input(s): CKTOTAL, CKMB, CKMBINDEX, TROPONINI in the last 168 hours. BNP: Invalid input(s): POCBNP CBG: No results for input(s): GLUCAP in the last 168 hours. D-Dimer No results for input(s): DDIMER in the last 72 hours. Hgb A1c No results for input(s): HGBA1C in the last 72 hours. Lipid Profile No results for input(s): CHOL, HDL, LDLCALC, TRIG, CHOLHDL, LDLDIRECT in the last  72 hours. Thyroid function studies No results for input(s): TSH, T4TOTAL, T3FREE, THYROIDAB in the last 72 hours.  Invalid input(s): FREET3 Anemia work up No results for input(s): VITAMINB12, FOLATE, FERRITIN, TIBC, IRON, RETICCTPCT in the last 72 hours. Urinalysis    Component Value Date/Time   COLORURINE YELLOW 04/09/2018 0918   APPEARANCEUR HAZY (A) 04/09/2018 0918   LABSPEC 1.008 04/09/2018 0918   PHURINE 8.0 04/09/2018 0918   GLUCOSEU NEGATIVE 04/09/2018 0918   HGBUR MODERATE (A) 04/09/2018 0918   BILIRUBINUR NEGATIVE 04/09/2018 0918   KETONESUR 5 (A) 04/09/2018 0918   PROTEINUR NEGATIVE 04/09/2018 0918   UROBILINOGEN 0.2  03/26/2013 1330   NITRITE NEGATIVE 04/09/2018 0918   LEUKOCYTESUR SMALL (A) 04/09/2018 0918   Sepsis Labs Invalid input(s): PROCALCITONIN,  WBC,  LACTICIDVEN Microbiology No results found for this or any previous visit (from the past 240 hour(s)).  Time coordinating discharge: 35 minutes  SIGNED:  Kerney Elbe, DO [facility] 04/11/2018, 6:29 PM Pager [phone]  If 7PM-7AM, please contact night-coverage [email or web address] Password [email or web address]

## 2018-04-10 NOTE — Progress Notes (Signed)
Received report from Acupuncturist. I agree with previous assessment. Will continue to monitor patient closely.

## 2018-04-10 NOTE — Evaluation (Signed)
Occupational Therapy Evaluation Patient Details Name: Kayla Chambers MRN: [id number] DOB: 07/26/1927 Today's Date: 04/10/2018    History of Present Illness Pt was admitted for a vertebral fx after a fall. PMH: previous compression fx, AVR   Clinical Impression   This 84 year old female was admitted for the above.  Will follow in acute setting with min guard to supervision level goals.  Pt is mod I at baseline. Husband is available to assist her.      Follow Up Recommendations  Supervision/Assistance - 24 hour;Home health OT.  Pt/husband do not want SNF   Equipment Recommendations  Other (comment)    Recommendations for Other Services       Precautions / Restrictions Precautions Precautions: Fall Required Braces or Orthoses: Spinal Brace Spinal Brace: (husband removed center piece; used lumbar portion only) Restrictions Weight Bearing Restrictions: No      Mobility Bed Mobility Overal bed mobility: Needs Assistance Bed Mobility: Rolling;Sidelying to Sit Rolling: Min assist Sidelying to sit: Min assist       General bed mobility comments: assist for log roll and to sit up from sidelying  Transfers Overall transfer level: Needs assistance Equipment used: Rolling walker (2 wheeled) Transfers: Sit to/from Stand Sit to Stand: Min assist         General transfer comment: assist to rise and steady    Balance Overall balance assessment: History of Falls                                         ADL either performed or assessed with clinical judgement   ADL Overall ADL's : Needs assistance/impaired Eating/Feeding: Independent   Grooming: Wash/dry hands;Min guard;Standing   Upper Body Bathing: Set up;Sitting   Lower Body Bathing: Minimal assistance;Sit to/from stand   Upper Body Dressing : Set up;Sitting   Lower Body Dressing: Moderate assistance;Sit to/from stand   Toilet Transfer: Minimal assistance;Ambulation;BSC;RW   Toileting-  Clothing Manipulation and Hygiene: Minimal assistance;Sit to/from stand         General ADL Comments: ambulated to bathroom, used toilet, washed hands and changed gown as she was wet from [place].  Pt usually crosses legs for LB adls.  Husband will assist her     Vision         Perception     Praxis      Pertinent Vitals/Pain Pain Assessment: No/denies pain     Hand Dominance     Extremity/Trunk Assessment Upper Extremity Assessment Upper Extremity Assessment: Generalized weakness           Communication Communication Communication: No difficulties   Cognition Arousal/Alertness: Awake/alert Behavior During Therapy: WFL for tasks assessed/performed Overall Cognitive Status: Within Functional Limits for tasks assessed                                     General Comments       Exercises     Shoulder Instructions      Home Living Family/patient expects to be discharged to:: Private residence Living Arrangements: Spouse/significant other                 Bathroom Shower/Tub: Teacher, early years/pre: Standard     Home Equipment: Bedside commode;Shower seat;Walker - 2 wheels;Cane - quad   Additional Comments: bedroom is upstairs but  pt can have a first floor set up.      Prior Functioning/Environment Level of Independence: Independent with assistive device(s)        Comments: pt usually uses cane; did not when she fell.  Husband introduced himself as pt's caregiver.  They have a daughter who is an OT in [place]        OT Problem List: Decreased strength;Decreased activity tolerance;Impaired balance (sitting and/or standing);Decreased knowledge of use of DME or AE;Decreased knowledge of precautions      OT Treatment/Interventions: Self-care/ADL training;DME and/or AE instruction;Patient/family education;Balance training;Therapeutic activities    OT Goals(Current goals can be found in the care plan section) Acute  Rehab OT Goals Patient Stated Goal: home OT Goal Formulation: With patient/family Time For Goal Achievement: 04/24/18 Potential to Achieve Goals: Good ADL Goals Pt Will Transfer to Toilet: with min guard assist;ambulating;bedside commode Pt Will Perform Toileting - Clothing Manipulation and hygiene: with supervision;sit to/from stand Additional ADL Goal #1: pt will stand at supervision level for adls for 2 minutes without LOB  OT Frequency: Min 2X/week   Barriers to D/C:            Co-evaluation              AM-PAC PT "6 Clicks" Daily Activity     Outcome Measure Help from another person eating meals?: None Help from another person taking care of personal grooming?: A Little Help from another person toileting, which includes using toliet, bedpan, or urinal?: A Little Help from another person bathing (including washing, rinsing, drying)?: A Little Help from another person to put on and taking off regular upper body clothing?: A Little Help from another person to put on and taking off regular lower body clothing?: A Lot 6 Click Score: 18   End of Session Nurse Communication: Mobility status(brace)  Activity Tolerance: Patient tolerated treatment well Patient left: in chair;with call bell/phone within reach;with chair alarm set  OT Visit Diagnosis: Unsteadiness on feet (R26.81)                Time: 0355-9741 OT Time Calculation (min): 34 min Charges:  OT General Charges $OT Visit: 1 Visit OT Evaluation $OT Eval Low Complexity: 1 Low OT Treatments $Self Care/Home Management : 8-22 mins G-Codes:     [place], OTR/L [phone] 04/10/2018  [name] 04/10/2018, 12:28 PM

## 2018-04-11 ENCOUNTER — Emergency Department (HOSPITAL_COMMUNITY)
Admission: EM | Admit: 2018-04-11 | Discharge: 2018-04-11 | Disposition: A | Discharge status: Home / Self Care | Payer: Medicare Other | Attending: Emergency Medicine | Admitting: Emergency Medicine

## 2018-04-11 ENCOUNTER — Encounter (HOSPITAL_COMMUNITY): Payer: Self-pay | Admitting: Emergency Medicine

## 2018-04-11 DIAGNOSIS — I2510 Atherosclerotic heart disease of native coronary artery without angina pectoris: Secondary | ICD-10-CM | POA: Diagnosis not present

## 2018-04-11 DIAGNOSIS — Z7901 Long term (current) use of anticoagulants: Secondary | ICD-10-CM | POA: Diagnosis not present

## 2018-04-11 DIAGNOSIS — I5032 Chronic diastolic (congestive) heart failure: Secondary | ICD-10-CM | POA: Diagnosis not present

## 2018-04-11 DIAGNOSIS — I4891 Unspecified atrial fibrillation: Secondary | ICD-10-CM | POA: Insufficient documentation

## 2018-04-11 DIAGNOSIS — Z79899 Other long term (current) drug therapy: Secondary | ICD-10-CM | POA: Diagnosis not present

## 2018-04-11 DIAGNOSIS — M545 Low back pain, unspecified: Secondary | ICD-10-CM

## 2018-04-11 MED ORDER — MORPHINE SULFATE (PF) 4 MG/ML IV SOLN
4.0000 mg | Freq: Once | INTRAVENOUS | Status: AC
  Administered 2018-04-11: 4 mg via INTRAVENOUS
  Filled 2018-04-11: qty 1

## 2018-04-11 MED ORDER — OXYCODONE HCL 5 MG PO TABS
5.0000 mg | ORAL_TABLET | Freq: Once | ORAL | Status: AC
  Administered 2018-04-11: 5 mg via ORAL
  Filled 2018-04-11: qty 1

## 2018-04-11 NOTE — ED Triage Notes (Signed)
Pt reports was discharged yesterday, Doctor had recommended cement in her compression fracture so went home. Pain in back has gotten worse and not getting any better. Pt does have on brace on and in place. No new falls or injuries since discharged yesterday. Pt had oxycodone around 1030am today.

## 2018-04-11 NOTE — Discharge Instructions (Signed)
Your history and exam was consistent with worsening of back pain from a recent fracture.  As she had no new falls and no other concerning symptoms, we do not feel you have any new injury or other concerning findings.  We got her pain improved with IV medication and the oral medication to maintain your discomfort.  We feel you are safe for discharge home.  Please follow-up with your primary doctor and use the resources that will come see you.  If any symptoms change or worsen, please return to the nearest emergency department.  Please be careful not to fall and usual back brace as recommended.

## 2018-04-11 NOTE — ED Notes (Signed)
Bed: WA01 Expected date:  Expected time:  Means of arrival:  Comments: 

## 2018-04-11 NOTE — ED Provider Notes (Signed)
[facility] DEPT Provider Note   CSN: [id number] Arrival date & time: 04/11/18  1415     History   Chief Complaint Chief Complaint  Patient presents with  . Back Pain    HPI Kayla Chambers is a 84 y.o. female.  The history is provided by the patient, medical records and a relative.  Back Pain   This is a recurrent problem. The current episode started more than 2 days ago. The problem occurs constantly. The problem has been gradually worsening. The pain is associated with falling. The pain is present in the lumbar spine. The quality of the pain is described as aching. The pain does not radiate. The pain is moderate. The symptoms are aggravated by bending and twisting. Pertinent negatives include no chest pain, no fever, no numbness, no headaches, no abdominal pain, no abdominal swelling, no bowel incontinence, no perianal numbness, no bladder incontinence, no dysuria, no pelvic pain, no leg pain, no paresthesias, no paresis, no tingling and no weakness. She has tried analgesics for the symptoms. The treatment provided mild relief.    Past Medical History:  Diagnosis Date  . Anxiety   . Aortic insufficiency    a. 03/2013 s/p  AVR w/ biologic 91mm Magna Ease peric tissue valve, model 300TFX, ser# [id number].   . Back pain    BACK BRACE/FRACTURE  . CAD (coronary artery disease)    a. 01/2013 mod calcification w/o sev dzs;  b. 03/2013 CABG x1 VG->RCA @ time of AVR  . Chronic diastolic CHF (congestive heart failure) ([place])    a. 01/2013 echo: EF 50-55%.  . Dementia   . Dilated aortic root ([place])    a. 03/2013 s/p Bentall procedure with Ao Root replacement (65mm Valsalva graft) w/ reimplantationof the R and L coronary ostium.  . Fibromyalgia   . Headache(784.0)    hx migraines  . Hypothyroidism   . Intracranial aneurysm    in late 1970's  . 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 Neoadjuvant RT by Dr. Yisroel Ramming. Surveillance plan: MRI 12/08/11 shows changes in presumed post-op seroma/hematoma with slight increase in size - follow up MRI 11/18/12 showed same size. Will continue with q3 month MRI. CT C/A/P annually alternating with CXR at 6 months.   . Osteoporosis   . Paroxysmal A-fib ([place])    a. Dx 01/2013 - placed on xarelto;  b. 03/2013 s/p left sided MAZE and LA clip @ time of AVR/CABG.  . Peripheral neuropathy   . PMR (polymyalgia rheumatica) (H[facility])    Inactive  . Rheumatic heart disease   . 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 Vertigo     Patient Active Problem List   Diagnosis Date Noted  . Vertebral fracture, osteoporotic, initial encounter ([place]) 04/09/2018  . Compression fracture of vertebrae ([place]) 04/09/2018  . Rheumatic heart disease   . PMR (polymyalgia rheumatica) (H[facility])   . Paroxysmal A-fib ([place])   . Intracranial aneurysm   . Fibromyalgia   . Anxiety   . H/O aortic valve replacement 02/02/2016  . Cerebrovascular disease 02/02/2016  . Hyperlipidemia 02/02/2016  . Vertigo 01/07/2015  . Bronchitis 12/02/2013  . Malaise and fatigue 05/26/2013  . PAF (paroxysmal atrial fibrillation) ([place]) 04/08/2013  . HTN (hypertension) 04/08/2013  . Chronic diastolic CHF (congestive heart failure) ([place]) 04/08/2013  . CAD (coronary artery disease)  04/08/2013  . Acute on chronic diastolic heart failure ([place]) 01/31/2013  . Disorder of liver 10/07/2012  . Solitary pulmonary nodule 10/07/2012  . 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 Elevated blood pressure 09/12/2011  . Dizziness 08/09/2011  . Peripheral neuropathy 03/07/2011  . Aortic insufficiency 03/06/2011  . Dilated aortic root ([place]) 03/06/2011  . 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 Hypothyroidism 03/06/2011  . Polymyalgia rheumatica ([place]) 03/06/2011    Past Surgical History:  Procedure Laterality Date  . ABDOMINAL  HYSTERECTOMY    . ASCENDING AORTIC ROOT REPLACEMENT N/A 03/31/2013   Procedure: ASCENDING AORTIC ROOT REPLACEMENT;  Surgeon: Grace Isaac, MD;  Location: [facility];  Service: Open Heart Surgery;  Laterality: N/A;  . BLADDER SUSPENSION    . CARDIOVERSION N/A 01/29/2017   Procedure: CARDIOVERSION;  Surgeon: Jerline Pain, MD;  Location: [facility];  Service: Cardiovascular;  Laterality: N/A;  . CARDIOVERSION N/A 10/22/2017   Procedure: CARDIOVERSION;  Surgeon: Lelon Perla, MD;  Location: [facility] ENDOSCOPY;  Service: Cardiovascular;  Laterality: N/A;  . COLONOSCOPY  10/25/2011   Procedure: COLONOSCOPY;  Surgeon: Garlan Fair, MD;  Location: [facility] ENDOSCOPY;  Service: Endoscopy;  Laterality: N/A;  . EYE SURGERY Bilateral    cataracts  . INTRAOPERATIVE TRANSESOPHAGEAL ECHOCARDIOGRAM N/A 03/31/2013   Procedure: INTRAOPERATIVE TRANSESOPHAGEAL ECHOCARDIOGRAM;  Surgeon: Grace Isaac, MD;  Location: [facility];  Service: Open Heart Surgery;  Laterality: N/A;  . LEFT AND RIGHT HEART CATHETERIZATION WITH CORONARY ANGIOGRAM Right 02/02/2013   Procedure: LEFT AND RIGHT HEART CATHETERIZATION WITH CORONARY ANGIOGRAM;  Surgeon: Hillary Bow, MD;  Location: [facility] CATH LAB;  Service: Cardiovascular;  Laterality: Right;  Marland Kitchen MAZE N/A 03/31/2013   Procedure: MAZE;  Surgeon: Grace Isaac, MD;  Location: [facility];  Service: Open Heart Surgery;  Laterality: N/A;  . Sarcoma resection  2011     OB History   None      Home Medications    Prior to Admission medications   Medication Sig Start Date End Date Taking? Authorizing Provider  ALPRAZolam Duanne Moron) 0.5 MG tablet Take 1 tablet (0.5 mg total) by mouth at bedtime as needed for anxiety (for sleep). 02/14/18   Lelon Perla, MD  amiodarone (PACERONE) 200 MG tablet Take 1 tablet (200 mg total) by mouth daily. 02/14/18   Lelon Perla, MD  apixaban (ELIQUIS) 2.5 MG TABS tablet Take 1 tablet (2.5 mg total) by mouth 2 (two) times daily. 04/10/18   Raiford Noble  Latif, DO  Ascorbic Acid (VITAMIN C PO) Take 1 tablet by mouth daily as needed (FOR IMMUNE SYSTEM BOOST).     [provider]  Calcium Carbonate-Vitamin D (CALCIUM + D PO) Take 1 tablet by mouth daily. CALCIBOOST    [provider]  Cholecalciferol (VITAMIN D3) 2000 units capsule Take 2,000 Units by mouth daily.    [provider]  CRANBERRY PO Take 1 tablet by mouth 2 (two) times daily as needed (FOR BLADDER/URINARY ISSUES.).     [provider]  furosemide (LASIX) 40 MG tablet Take 40 mg by mouth daily.    [provider]  lactase (LACTAID) 3000 UNITS tablet Take 1 tablet by mouth daily as needed (only when eating dairy).    [provider]  levothyroxine (SYNTHROID, LEVOTHROID) 75 MCG tablet Take 75 mcg by mouth at bedtime.     [provider]  Loperamide HCl (LOPERAMIDE A-D PO) Take 1 tablet by mouth 4 (four) times daily as  needed (diarrhea/loose stools.).     [provider]  Multiple Vitamin (MULTIVITAMIN WITH MINERALS) TABS Take 1 tablet by mouth daily.    [provider]  naproxen sodium (ALEVE) 220 MG tablet Take 220 mg by mouth 2 (two) times daily as needed (for pain.).    [provider]  ondansetron (ZOFRAN) 4 MG tablet Take 1 tablet (4 mg total) by mouth every 6 (six) hours as needed for nausea. 04/10/18   Raiford Noble Latif, DO  oxyCODONE (OXY IR/ROXICODONE) 5 MG immediate release tablet Take 1 tablet (5 mg total) by mouth every 4 (four) hours as needed for moderate pain. 04/10/18   Sheikh, Georgina Quint Latif, DO  polyethylene glycol [facility] / GLYCOLAX) packet Take 17 g by mouth daily. 04/11/18   Raiford Noble Latif, DO  potassium chloride SA (K-DUR,KLOR-CON) 20 MEQ tablet Take 1 tablet (20 mEq total) by mouth daily. 10/17/17   Lelon Perla, MD  pravastatin (PRAVACHOL) 40 MG tablet take 1 tablet by mouth every evening 04/16/17   Lelon Perla, MD  Probiotic Product (PROBIOTIC PO) Take 1 capsule by  mouth daily as needed (FOR DIGESTIVE HEALTH.).    [provider]  rOPINIRole (REQUIP) 0.25 MG tablet Take 0.25 mg by mouth 3 (three) times daily as needed (for restless leg syndrome.).    [provider]    Family History Family History  Problem Relation Age of Onset  . Heart disease Mother   . Stroke Mother   . Arthritis Father   . Cancer Father   . Anesthesia problems Brother   . Hypertension Sister     Social History Social History   Tobacco Use  . Smoking status: Never Smoker  . Smokeless tobacco: Never Used  Substance Use Topics  . Alcohol use: No  . Drug use: No     Allergies   Alendronate sodium; Ambien [zolpidem tartrate]; Lac bovis; Lyrica [pregabalin]; Milk-related compounds; Neurontin [gabapentin]; Procaine hcl; and Toprol xl [metoprolol succinate]   Review of Systems Review of Systems  Constitutional: Negative for chills and fever.  HENT: Negative for ear pain and sore throat.   Eyes: Negative for pain and visual disturbance.  Respiratory: Negative for cough and shortness of breath.   Cardiovascular: Negative for chest pain and palpitations.  Gastrointestinal: Negative for abdominal pain, bowel incontinence and vomiting.  Genitourinary: Negative for bladder incontinence, dysuria, hematuria and pelvic pain.  Musculoskeletal: Positive for back pain. Negative for arthralgias, neck pain and neck stiffness.  Skin: Negative for color change and rash.  Neurological: Negative for tingling, seizures, syncope, weakness, numbness, headaches and paresthesias.  All other systems reviewed and are negative.    Physical Exam Updated Vital Signs BP 133/81 (BP Location: Left Arm)   Pulse 76   Temp 98.8 F (37.1 C) (Oral)   Resp 16   SpO2 91%   Physical Exam  Constitutional: She is oriented to person, place, and time. She appears well-developed and well-nourished. No distress.  HENT:  Head: Normocephalic and atraumatic.  Nose: Nose normal.    Mouth/Throat: Oropharynx is clear and moist. No oropharyngeal exudate.  Eyes: Pupils are equal, round, and reactive to light. Conjunctivae and EOM are normal.  Neck: Neck supple.  Cardiovascular: Normal rate and regular rhythm.  No murmur heard. Pulmonary/Chest: Effort normal and breath sounds normal. No respiratory distress. She has no wheezes. She has no rales. She exhibits no tenderness.  Abdominal: Soft. Bowel sounds are normal. There is no tenderness.  Musculoskeletal: She exhibits tenderness (  low back). She exhibits no edema.  Lymphadenopathy:    She has no cervical adenopathy.  Neurological: She is alert and oriented to person, place, and time. No sensory deficit. She exhibits normal muscle tone.  Skin: Skin is warm and dry. Capillary refill takes less than 2 seconds. No rash noted. She is not diaphoretic. No erythema.  Psychiatric: She has a normal mood and affect.  Nursing note and vitals reviewed.    ED Treatments / Results  Labs (all labs ordered are listed, but only abnormal results are displayed) Labs Reviewed - No data to display  EKG None  Radiology No results found.  Procedures Procedures (including critical care time)  Medications Ordered in ED Medications  morphine 4 MG/ML injection 4 mg (4 mg Intravenous Given 04/11/18 1736)  oxyCODONE (Oxy IR/ROXICODONE) immediate release tablet 5 mg (5 mg Oral Given 04/11/18 2001)     Initial Impression / Assessment and Plan / ED Course  I have reviewed the triage vital signs and the nursing notes.  Pertinent labs & imaging results that were available during my care of the patient were reviewed by me and considered in my medical decision making (see chart for details).     Kayla Chambers is a 84 y.o. female with a past medical history significant for CAD, atrial fibrillation on Eliquis therapy, aortic valve replacement, dementia, CHF, and prior sarcoma of the leg and recent lumbar spine compression fracture who  presents with continued back pain.  Patient says that she was discharged from the hospital yesterday after she had a fall several days ago and found a compression fracture.  Patient has been using a back brace as directed.  She reports that at discharge her pain was controlled and she was able to ambulate safely however she says that since last night her pain has continued to worsen and it is severe today.  She reports that she tries to stand walk or move her legs the pain jumps to a 10 out of 10 in severity.  She denies any falls, numbness, tingling, or weakness of legs.  She denies any urinary or  stool incontinence.  She denies any changes in the pain and still feels an aching sharp pain in her low back.  She denies fevers, chills, cough, congestion, nausea, vomiting, or other new symptoms.  Next  On exam, patient had pain with leg movement but had no significant edema in her legs.  Patient had normal sensation in her legs.  Normal pulses appreciated.  Patient could plantarflex and dorsiflex her feet with strength however she had pain with leg raising.  Patient had no abdominal pain or chest tenderness.  Lungs were clear.  Given lack of new injury, I doubt new fracture or worsen fracture.  I am concerned about poor pain management at home affecting the patient's ambulation.  Patient will be given pain medicine to try and get her down to a more manageable level however in the absence of concern for new injury, or absence of any new red flags for worsened injury, I doubt patient will require admission.  We will also speak with care coordination to determine assistance measures patient might be able to have at home for further pain management but will try and improve her pain here in the emergency department first.  7:40 PM Patient's pain significantly improved after morphine.  Patient was able to stand up and ability to a chair where she was feeling better.  Patient will be transitioned to oral  oxycodone.   Next  Care coordination was able to the patient and report that she will have assistance come to her house tomorrow for further evaluation and management.  Family is agreeable to take the patient home given the improvement in her symptoms.  Patient understood return precautions and agreed with plan of care.  I do not feel patient has new injury or worsened fracture.  Patient had no other questions or concerns and discharged with improved symptoms.    Final Clinical Impressions(s) / ED Diagnoses   Final diagnoses:  Acute low back pain without sciatica, unspecified back pain laterality    ED Discharge Orders    None       Clinical Impression: 1. Acute low back pain without sciatica, unspecified back pain laterality     Disposition: Discharge  Condition: Good  I have discussed the results, Dx and Tx plan with the pt(& family if present). He/she/they expressed understanding and agree(s) with the plan. Discharge instructions discussed at great length. Strict return precautions discussed and pt &/or family have verbalized understanding of the instructions. No further questions at time of discharge.    New Prescriptions   No medications on file    Follow Up: Care, [facility] [place] [address] [phone]  Follow up   Donald Prose, MD [address] [place] [address] [phone]     [facility]EMERGENC[facility] DEPT [place] [phone] mc [place] [phone]       Tegeler, Gwenyth Allegra, MD 04/12/18 [phone]

## 2018-04-11 NOTE — Care Management Note (Signed)
Case Management Note  Patient Details  Name: Kayla Chambers MRN: [id number] Date of Birth: 1927/06/11  CM contacted by Georgina Snell with [facility] to advise that pt has returned to the ED due to pain.  He adidtionally advised that pt has not been taking her pain medication.  CM updated Dr. Sherry Ruffing and additionally spoke with pt and family at bedside to reinterate the advantage of the Home First program.  CM requested female urinal and bedpan for pt to go home with from Catalina Lunger, RN.  Updated Georgina Snell that pt would be returning home after IV pain medication.  No further CM needs noted at this time.  Expected Discharge Date:   04/11/2018               Expected Discharge Plan:  [place]  Discharge planning Services  CM Consult  Post Acute Care Choice:  Home Health Choice offered to:  Patient, Spouse  HH Arranged:  RN, PT, OT, Nurse's Aide, Social Work [employer] Agency:  [place]  Status of Service:  Completed, signed off  [place] Amon, Benjaman Lobe, RN 04/11/2018, 4:54 PM

## 2018-04-11 NOTE — ED Notes (Signed)
Pt up to bedside commode with moderate  assist, Tolerated activity well. Family at bedside. Pt remains sitting up in chair.

## 2018-04-12 DIAGNOSIS — M797 Fibromyalgia: Secondary | ICD-10-CM | POA: Diagnosis not present

## 2018-04-12 DIAGNOSIS — E785 Hyperlipidemia, unspecified: Secondary | ICD-10-CM | POA: Diagnosis not present

## 2018-04-12 DIAGNOSIS — G629 Polyneuropathy, unspecified: Secondary | ICD-10-CM | POA: Diagnosis not present

## 2018-04-12 DIAGNOSIS — I2510 Atherosclerotic heart disease of native coronary artery without angina pectoris: Secondary | ICD-10-CM | POA: Diagnosis not present

## 2018-04-12 DIAGNOSIS — M353 Polymyalgia rheumatica: Secondary | ICD-10-CM | POA: Diagnosis not present

## 2018-04-12 DIAGNOSIS — F419 Anxiety disorder, unspecified: Secondary | ICD-10-CM | POA: Diagnosis not present

## 2018-04-12 DIAGNOSIS — E039 Hypothyroidism, unspecified: Secondary | ICD-10-CM | POA: Diagnosis not present

## 2018-04-12 DIAGNOSIS — F0280 Dementia in other diseases classified elsewhere without behavioral disturbance: Secondary | ICD-10-CM | POA: Diagnosis not present

## 2018-04-12 DIAGNOSIS — M8008XA Age-related osteoporosis with current pathological fracture, vertebra(e), initial encounter for fracture: Secondary | ICD-10-CM | POA: Diagnosis not present

## 2018-04-12 DIAGNOSIS — I480 Paroxysmal atrial fibrillation: Secondary | ICD-10-CM | POA: Diagnosis not present

## 2018-04-12 DIAGNOSIS — I0981 Rheumatic heart failure: Secondary | ICD-10-CM | POA: Diagnosis not present

## 2018-04-12 DIAGNOSIS — I5032 Chronic diastolic (congestive) heart failure: Secondary | ICD-10-CM | POA: Diagnosis not present

## 2018-04-15 DIAGNOSIS — I0981 Rheumatic heart failure: Secondary | ICD-10-CM | POA: Diagnosis not present

## 2018-04-15 DIAGNOSIS — M8008XA Age-related osteoporosis with current pathological fracture, vertebra(e), initial encounter for fracture: Secondary | ICD-10-CM | POA: Diagnosis not present

## 2018-04-16 DIAGNOSIS — M8008XA Age-related osteoporosis with current pathological fracture, vertebra(e), initial encounter for fracture: Secondary | ICD-10-CM | POA: Diagnosis not present

## 2018-04-16 DIAGNOSIS — I0981 Rheumatic heart failure: Secondary | ICD-10-CM | POA: Diagnosis not present

## 2018-04-17 DIAGNOSIS — M8008XA Age-related osteoporosis with current pathological fracture, vertebra(e), initial encounter for fracture: Secondary | ICD-10-CM | POA: Diagnosis not present

## 2018-04-17 DIAGNOSIS — I0981 Rheumatic heart failure: Secondary | ICD-10-CM | POA: Diagnosis not present

## 2018-04-18 DIAGNOSIS — I110 Hypertensive heart disease with heart failure: Secondary | ICD-10-CM | POA: Diagnosis not present

## 2018-04-18 DIAGNOSIS — I5032 Chronic diastolic (congestive) heart failure: Secondary | ICD-10-CM | POA: Diagnosis not present

## 2018-04-18 DIAGNOSIS — E441 Mild protein-calorie malnutrition: Secondary | ICD-10-CM | POA: Diagnosis not present

## 2018-04-18 DIAGNOSIS — E039 Hypothyroidism, unspecified: Secondary | ICD-10-CM | POA: Diagnosis not present

## 2018-04-18 DIAGNOSIS — M810 Age-related osteoporosis without current pathological fracture: Secondary | ICD-10-CM | POA: Diagnosis not present

## 2018-04-18 DIAGNOSIS — S32000A Wedge compression fracture of unspecified lumbar vertebra, initial encounter for closed fracture: Secondary | ICD-10-CM | POA: Diagnosis not present

## 2018-04-18 DIAGNOSIS — I480 Paroxysmal atrial fibrillation: Secondary | ICD-10-CM | POA: Diagnosis not present

## 2018-04-21 ENCOUNTER — Other Ambulatory Visit: Payer: Self-pay | Admitting: Cardiology

## 2018-04-21 ENCOUNTER — Telehealth: Payer: Self-pay | Admitting: *Deleted

## 2018-04-21 ENCOUNTER — Telehealth: Payer: Self-pay | Admitting: Cardiology

## 2018-04-21 DIAGNOSIS — E785 Hyperlipidemia, unspecified: Secondary | ICD-10-CM

## 2018-04-21 DIAGNOSIS — M4856XD Collapsed vertebra, not elsewhere classified, lumbar region, subsequent encounter for fracture with routine healing: Secondary | ICD-10-CM | POA: Diagnosis not present

## 2018-04-21 NOTE — Telephone Encounter (Signed)
I spoke with the pt's husband (pt was sleeping). She has been doing well from a cardiac standpoint, holding NSR, no unusual chest pain or dyspnea. I will forward to pharmacy for recommendations regarding holding Eliquis. Apparently Dr Rolena Infante wants to hold Eliquis for 7 days pre op.   Kerin Ransom PA-C 04/21/2018 3:53 PM

## 2018-04-21 NOTE — Telephone Encounter (Signed)
REFILL 

## 2018-04-21 NOTE — Telephone Encounter (Signed)
   [facility] Pre-operative Risk Assessment    Request for surgical clearance:  1. What type of surgery is being performed? L3 Kyphoplasty   2. When is this surgery scheduled? TBD   3. What type of clearance is required (medical clearance vs. Pharmacy clearance to hold med vs. Both)? both  4. Are there any medications that need to be held prior to surgery and how long?Eliquis for 7 days   5. Practice name and name of physician performing surgery? [email or web address]   6. What is your office phone number [phone]    7.   What is your office fax number [phone]  8.   Anesthesia type (None, local, MAC, general) ? unknown   Kayla Chambers 04/21/2018, 3:33 PM  _________________________________________________________________   (provider comments below)

## 2018-04-21 NOTE — Telephone Encounter (Signed)
Patient with diagnosis of atrial fibrillation on Eliquis for anticoagulation.    Procedure: L3 kyphoplasty Date of procedure: TBD  CHADS2-VASc score of  6 (CHF, HTN, AGE,  CAD, AGE, female)  CrCl 67.6 Platelet count 166  Per office protocol, patient can hold Eliquis for 3 days prior to procedure.   If surgeon wants Eliquis held for > 3 days (our protocol states 3 days for spinal procedures) will need to have MD approval.

## 2018-04-21 NOTE — Telephone Encounter (Signed)
New Message   Pt's husband is requesting a call from the nurse. Would not disclose reason

## 2018-04-21 NOTE — Telephone Encounter (Signed)
Spoke with pt husband, patient is needing surgical clearance for L3 kyphoplasty, that has been sent to the pre-op pool but the patient wants dr Stanford Breed to know they decreased her eliquis to 2.5 mg in the ER after a fall. They are also concerned about stopping the eliquis. Aware the clearance will go through the pre-op process in the office but will make dr Stanford Breed aware of his concerns.

## 2018-04-22 DIAGNOSIS — M8008XA Age-related osteoporosis with current pathological fracture, vertebra(e), initial encounter for fracture: Secondary | ICD-10-CM | POA: Diagnosis not present

## 2018-04-22 DIAGNOSIS — I0981 Rheumatic heart failure: Secondary | ICD-10-CM | POA: Diagnosis not present

## 2018-04-22 NOTE — Telephone Encounter (Signed)
Ok to hold apixaban 2 days prior to procedure and resume after when ok with radiology. Agree with decreased dose of apixaban as weight now less than 58 kg Kayla Chambers

## 2018-04-23 DIAGNOSIS — M8008XA Age-related osteoporosis with current pathological fracture, vertebra(e), initial encounter for fracture: Secondary | ICD-10-CM | POA: Diagnosis not present

## 2018-04-23 DIAGNOSIS — I0981 Rheumatic heart failure: Secondary | ICD-10-CM | POA: Diagnosis not present

## 2018-04-23 NOTE — Telephone Encounter (Signed)
   Primary Cardiologist: [name], MD  Chart reviewed as part of pre-operative protocol coverage. Patient was contacted 04/23/2018 in reference to pre-operative risk assessment for pending surgery as outlined below.  Kayla Chambers was last seen 01/2018 by Dr. Stanford Breed. She has history of CAD, Bentall with pericardial AVR, PAF, MAZE, chronic diastolic CHF, fibromyalgia, hypothyroidism, PMR, rheumatic heart disease, sarcoma. At last OV 01/2018, HR running in the 40s so metoprolol was stopped and amiodarone was decreased. Conservative management planned. Recent adm for vertebral fx but patient did not want surgery or nuc med scan. HR 60s. Eliquis dose reduced by hospitalist to 2.5mg  bid "given patient's age and creatinine." Creatinine is WNL. Last wt 61.7kg.   Before calling patient, will route to Dr. Everrett Coombe for input on the Eliquis dose reduction and input on holding Eliquis for 7 days per pre-op intake. Pharmacy is stating it may be OK to hold for 3 days, but if surgeon wants Eliquis held >3 days, will need cardiologist approval. See below. Patient will need phone call once input has been obtained.  Charlie Pitter, PA-C 04/23/2018, 3:48 PM

## 2018-04-23 NOTE — Telephone Encounter (Signed)
Spoke with pt husband, Aware of dr crenshaw's recommendations.  

## 2018-04-24 DIAGNOSIS — I0981 Rheumatic heart failure: Secondary | ICD-10-CM | POA: Diagnosis not present

## 2018-04-24 DIAGNOSIS — M8008XA Age-related osteoporosis with current pathological fracture, vertebra(e), initial encounter for fracture: Secondary | ICD-10-CM | POA: Diagnosis not present

## 2018-04-24 NOTE — Telephone Encounter (Signed)
   Primary Cardiologist: [name], MD  Chart reviewed as part of pre-operative protocol coverage.  Spoke with patient on the phone who felt she is doing well without new cardiac symptoms. She ambulates with PT and has not had any new anginal symptoms. Given past medical history and time since last visit, based on ACC/AHA guidelines, Kayla Chambers would be at acceptable risk for the planned procedure without further cardiovascular testing. Her underlying age and comorbidities do put her at increased risk for procedure but no cardiac testing felt indicated at this time.  She understands this risk.  Regarding duration of holding blood thinner prior to surgery, Dr. Stanford Breed states, "Hold apixaban 3 days prior to surgery and resume after when ok with ortho." Dr. Stanford Breed does not feel any specific SBE ppx needed but will defer routine abx to surgeon.  I will route this recommendation to the requesting party via Epic fax function and remove from pre-op pool.  Please call with questions.  For our documentation's sake, in hospital had fallen to 58kg so Dr. Stanford Breed affirmed he's OK with the lower dose of apixaban as she is taking.  Charlie Pitter, PA-C 04/24/2018, 2:44 PM

## 2018-04-24 NOTE — Telephone Encounter (Signed)
Hold apixaban 3 days prior to surgery and resume after when ok with ortho [name]

## 2018-04-24 NOTE — Telephone Encounter (Signed)
Also looking for input on the question about Eliquis 2.5mg  vs 5mg  as below (IM reduced in hospital). Thanks! Karilynn Carranza PA-C

## 2018-04-30 DIAGNOSIS — I0981 Rheumatic heart failure: Secondary | ICD-10-CM | POA: Diagnosis not present

## 2018-04-30 DIAGNOSIS — M8008XA Age-related osteoporosis with current pathological fracture, vertebra(e), initial encounter for fracture: Secondary | ICD-10-CM | POA: Diagnosis not present

## 2018-05-01 ENCOUNTER — Telehealth: Payer: Self-pay | Admitting: *Deleted

## 2018-05-01 MED ORDER — APIXABAN 2.5 MG PO TABS: 2.5000 mg | ORAL_TABLET | Freq: Two times a day (BID) | ORAL | 1 refills | Status: DC

## 2018-05-01 NOTE — Addendum Note (Signed)
Addended by: Rockne Menghini on: 05/01/2018 05:09 PM   Modules accepted: Orders

## 2018-05-01 NOTE — Pre-Procedure Instructions (Signed)
BRANDON SCARBROUGH  05/01/2018      [facility] [phone] - [place], [place] - [address] [place] [address] Phone: [phone] Fax: [phone]  [facility] [place] [address] Phone: [phone] Fax: [phone]  [facility] [phone] - [place], [place] - [address] AT [address] [place] [address] Phone: [phone] Fax: [phone]    Your procedure is scheduled on June 19  Report to [facility] at 1230 P.M.  Call this number if you have problems the morning of surgery:  [phone]   Remember:  NOTHING TO EAT OR DRINK AFTER MIDNIGHT    Take these medicines the morning of surgery with A SIP OF WATER  amiodarone (PACERONE)  oxyCODONE (OXY IR/ROXICODONE) IF NEEDED  7 days prior to surgery STOP taking any Aspirin(unless otherwise instructed by your surgeon), Aleve, Naproxen, Ibuprofen, Motrin, Advil, Goody's, BC's, all herbal medications, fish oil, and all vitamins  STOP ELIQUIS 3 DAYS PRIOR TO SURGERY PER DR. CRENSHAW    Do not wear jewelry, make-up or nail polish.  Do not wear lotions, powders, or perfumes, or deodorant.  Do not shave 48 hours prior to surgery.   Do not bring valuables to the hospital.  [facility] is not responsible for any belongings or valuables.  Contacts, dentures or bridgework may not be worn into surgery.  Leave your suitcase in the car.  After surgery it may be brought to your room.  For patients admitted to the hospital, discharge time will be determined by your treatment team.  Patients discharged the day of surgery will not be allowed to drive home.    Special instructions:   [place]- Preparing For Surgery  Before surgery, you can play an important role. Because  skin is not sterile, your skin needs to be as free of germs as possible. You can reduce the number of germs on your skin by washing with CHG (chlorahexidine gluconate) Soap before surgery.  CHG is an antiseptic cleaner which kills germs and bonds with the skin to continue killing germs even after washing.    Oral Hygiene is also important to reduce your risk of infection.  Remember - BRUSH YOUR TEETH THE MORNING OF SURGERY WITH YOUR REGULAR TOOTHPASTE  Please do not use if you have an allergy to CHG or antibacterial soaps. If your skin becomes reddened/irritated stop using the CHG.  Do not shave (including legs and underarms) for at least 48 hours prior to first CHG shower. It is OK to shave your face.  Please follow these instructions carefully.   1. Shower the NIGHT BEFORE SURGERY and the MORNING OF SURGERY with CHG.   2. If you chose to wash your hair, wash your hair first as usual with your normal shampoo.  3. After you shampoo, rinse your hair and body thoroughly to remove the shampoo.  4. Use CHG as you would any other liquid soap. You can apply CHG directly to the skin and wash gently with a scrungie or a clean washcloth.   5. Apply the CHG Soap to your body ONLY FROM THE NECK DOWN.  Do not use on open wounds or open sores. Avoid contact with your eyes, ears, mouth and genitals (private parts). Wash Face and genitals (private parts)  with your normal soap.  6. Wash thoroughly, paying special attention to the area where your surgery will be performed.  7. Thoroughly rinse your body with warm water from the neck down.  8. DO NOT shower/wash with your normal soap after using and rinsing off the CHG Soap.  9. Pat yourself dry with a CLEAN TOWEL.  10. Wear CLEAN PAJAMAS to bed the night before surgery, wear comfortable clothes the morning of surgery  11. Place CLEAN SHEETS on your bed the night of your first shower and DO NOT SLEEP WITH PETS.    Day of Surgery:  Do not apply any  deodorants/lotions.  Please wear clean clothes to the hospital/surgery center.   Remember to brush your teeth WITH YOUR REGULAR TOOTHPASTE.   Please read over the following fact sheets that you were given.

## 2018-05-01 NOTE — Telephone Encounter (Signed)
refill 

## 2018-05-02 ENCOUNTER — Encounter (HOSPITAL_COMMUNITY): Payer: Self-pay

## 2018-05-02 ENCOUNTER — Encounter (HOSPITAL_COMMUNITY)
Admission: RE | Admit: 2018-05-02 | Discharge: 2018-05-02 | Disposition: A | Discharge status: Home / Self Care | Payer: Medicare Other | Source: Ambulatory Visit | Attending: Orthopedic Surgery | Admitting: Orthopedic Surgery

## 2018-05-02 ENCOUNTER — Other Ambulatory Visit: Payer: Self-pay

## 2018-05-02 DIAGNOSIS — Z79899 Other long term (current) drug therapy: Secondary | ICD-10-CM | POA: Insufficient documentation

## 2018-05-02 DIAGNOSIS — Z7901 Long term (current) use of anticoagulants: Secondary | ICD-10-CM | POA: Diagnosis not present

## 2018-05-02 DIAGNOSIS — M4856XA Collapsed vertebra, not elsewhere classified, lumbar region, initial encounter for fracture: Secondary | ICD-10-CM | POA: Diagnosis not present

## 2018-05-02 DIAGNOSIS — Z01818 Encounter for other preprocedural examination: Secondary | ICD-10-CM | POA: Diagnosis not present

## 2018-05-02 DIAGNOSIS — Z79890 Hormone replacement therapy (postmenopausal): Secondary | ICD-10-CM | POA: Insufficient documentation

## 2018-05-02 LAB — CBC
HCT: 42 % (ref 36.0–46.0)
Hemoglobin: 13.8 g/dL (ref 12.0–15.0)
MCH: 31.2 pg (ref 26.0–34.0)
MCHC: 32.9 g/dL (ref 30.0–36.0)
MCV: 95 fL (ref 78.0–100.0)
PLATELETS: 202 10*3/uL (ref 150–400)
RBC: 4.42 MIL/uL (ref 3.87–5.11)
RDW: 13.9 % (ref 11.5–15.5)
WBC: 7.9 10*3/uL (ref 4.0–10.5)

## 2018-05-02 LAB — BASIC METABOLIC PANEL
Anion gap: 10 (ref 5–15)
BUN: 21 mg/dL — AB (ref 6–20)
CALCIUM: 10 mg/dL (ref 8.9–10.3)
CO2: 26 mmol/L (ref 22–32)
CREATININE: 0.9 mg/dL (ref 0.44–1.00)
Chloride: 101 mmol/L (ref 101–111)
GFR, EST NON AFRICAN AMERICAN: 55 mL/min — AB (ref >60)
Glucose, Bld: 105 mg/dL — ABNORMAL HIGH (ref 65–99)
Potassium: 4.6 mmol/L (ref 3.5–5.1)
Sodium: 137 mmol/L (ref 135–145)

## 2018-05-02 LAB — PROTIME-INR
INR: 1.27
PROTHROMBIN TIME: 15.8 s — AB (ref 11.4–15.2)

## 2018-05-02 LAB — SURGICAL PCR SCREEN
MRSA, PCR: NEGATIVE
STAPHYLOCOCCUS AUREUS: NEGATIVE

## 2018-05-02 NOTE — Progress Notes (Signed)
Anesthesia PAT Evaluation:  Case:  [id number] Date/Time:  05/07/18 1415   Procedure:  KYPHOPLASTY L3 (N/A ) - 90 mins   Anesthesia type:  General   Pre-op diagnosis:  L3 Compression Fracture   Location:  [facility] OR ROOM 04 / [facility]   Surgeon:  Kayla Schools, MD    Although case is posted for general anesthesia, patient and family would like for local with MAC to be considered if possible, at least partly due to concerns with memory issues. Discussed that although it may be a possible anesthesia option, that both her surgeon and anesthesiologist would have to feel this was most appropriate anesthesia type for her for this ~ 90 minute procedure. I have notified Kayla Chambers at Dr. Rolena Chambers' office of patient's wishes. Anesthesiologist and surgeon can further discuss with patient prior to surgery. She and family understand that she may still require general anesthesia.   DISCUSSION: Patient is a 84 year old female scheduled for the above procedure. 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   Per telephone encounter by Kayla Copa, PA-C, "Spoke with patient on the phone who felt she is doing well without new cardiac symptoms. She ambulates with Kayla Chambers and has not had any new anginal symptoms. Given past medical history and time since last visit, based on ACC/AHA guidelines, Kayla Chambers would be at acceptable risk for the planned procedure without further cardiovascular testing. Her underlying age and comorbidities do put her at increased risk for procedure but no cardiac testing felt indicated at this time.  She understands this risk. Regarding duration of holding blood thinner prior to surgery, Dr. Stanford Chambers states, "Hold apixaban 3 days prior to surgery and resume after when  ok with ortho." Dr. Stanford Chambers does not feel any specific SBE ppx needed but will defer routine abx to surgeon." Patient and family aware of when to hold apixaban.   She denied chest pain or SOB. She reports she can not tell when she is in afib, but her husband says she will generally act or report not feeling well when she is in afib. They have a [employer] app to check her rhythm at home if needed. She has chronic issues with left leg swelling since sarcoma resection, but this is currently managed well with Kayla Chambers. Prior to her 04/08/18 fall, she was ambulating with a cane intermittently. She lives with her husband. Her primary complaint now is chronic severe back pain since her 04/08/18 fall.   If no acute changes then I anticipate that she can proceed as planned.  VS: BP (!) 127/57   Pulse 75   Temp 36.5 C (Oral)   Resp 18   Ht _0  (1.651 m)   Wt 127 lb 6.8 oz (57.8 kg)   SpO2 100%   BMI 21.20 kg/m  Heart RRR, II/VI SEM. Lungs clear. Trace left ankle edema. Mallampati II.   PROVIDERS: Kayla Prose, MD is PCP Kayla Ruths, MD is cardiologist. Last visit 02/14/18.  LABS: Labs reviewed: Acceptable for surgery. (all labs ordered are listed, but only abnormal results are displayed)  Labs Reviewed  BASIC METABOLIC PANEL - Abnormal; Notable for the following components:      Result Value   Glucose, Bld 105 (*)    BUN 21 (*)    GFR calc non  Af Amer 55 (*)    All other components within normal limits  PROTIME-INR - Abnormal; Notable for the following components:   Prothrombin Time 15.8 (*)    All other components within normal limits  SURGICAL PCR SCREEN  CBC    IMAGES: CXR 02/20/18: IMPRESSION: 1. No change in hyper aeration.  Probable emphysematous change. 2. No active lung disease. 3. Stable cardiomegaly.  EKG: 04/09/18: SR, LAD, anteroseptal infarct (age indeterminate), lateral leads are also involved. No significant changes since last tracing.    CV: Echo  10/18/17: Study Conclusions - Left ventricle: The cavity size was normal. Wall thickness was   increased in a pattern of mild LVH. Systolic function was normal.   The estimated ejection fraction was in the range of 60% to 65%.   Wall motion was normal; there were no regional wall motion   abnormalities. - Ventricular septum: The contour showed diastolic flattening and   systolic flattening. - Aortic valve: A bioprosthesis was present. - Aortic root: The aortic root was mildly dilated. - Mitral valve: Calcified annulus. Mildly thickened leaflets .   There was mild regurgitation. - Left atrium: The atrium was mildly dilated. - Right atrium: The atrium was severely dilated. - Atrial septum: The septum bowed from right to left, consistent   with increased right atrial pressure. - Tricuspid valve: There was severe regurgitation. Impressions: - Normal LV systolic function; mild LVH; s/p AVR with normal mean   gradient of 6 mmHg; mildly dilated aortic root; mild MR; mild   LAE; severe RAE; severe TR.  Carotid U/S 02/02/16: IMPRESSIONS: Heterogeneous plaque, bilaterally.  1 to 39% bilateral ICA stenosis.  Normal subclavian arteries, bilaterally.  Patent vertebral arteries with antegrade flow.  Cardiac cath 02/02/13: Coronary angiography: Coronary dominance: right - Left mainstem: Short and without obstruction - Left anterior descending (LAD): Moderate calcification with about 30% luminal irregularity proximally. No critical obstruction.  The vessel terminates as an apical LAD and large distal diagonal. - Left circumflex (LCx): Consists mainly of a large OM branch.  There is about 40% segmental plaquing just beyond the takeoff of the small AV circumflex.   - Right coronary artery (RCA): Large caliber vessel with 30% proximal narrowing that is eccentric.  There is a large caliber PDA and smaller PDA, both free of major disease.  - Left ventriculography: There was ectopy with LV angiography, so  assessment of LV function is not reliable.  EF estimate would be 50%, but unreliable.  - Aortography reveals a very large aortic root down involving the sinus of valsalva.  There is mild calcification of the valve.  There is moderately severe aortic regurgitation  (3 plus) noted.   Final Conclusions:   1.  Aortic regurgitation, moderately severe 2.  Dilated LV with likely reduction in LV function 3.  Recent onset of atrial fibrillation 4.  Moderate coronary calcification, without critical obstruction. Recommendations:  1.  Defer surgical decisions to patient with Dr Mare Ferrari. 2.  Rate control. 3.  Given AI and wide pulse pressure, and very small size will defer reanticoagulation to am.  Would consider low dose heparin for 1-2 days before consideration a NOAC given AI and risk of bleeding.     Past Medical History:  Diagnosis Date  . Anxiety    takes xanax for sleep.   . Aortic insufficiency    a. 03/2013 s/p  AVR w/ biologic 27m Magna Ease peric tissue valve, model 300TFX, ser# [id number]   . Back pain  BACK BRACE/FRACTURE  . CAD (coronary artery disease)    a. 01/2013 mod calcification w/o sev dzs;  b. 03/2013 CABG x1 VG->RCA @ time of AVR  . Chronic diastolic CHF (congestive heart failure) ([place])    a. 01/2013 echo: EF 50-55%.  . Dementia   . Dilated aortic root ([place])    a. 03/2013 s/p Bentall procedure with Ao Root replacement (68m Valsalva graft) w/ reimplantationof the R and L coronary ostium.  .Marland KitchenDysrhythmia    A. FIB  . Fibromyalgia   . Headache(784.0)    hx migraines  . Hypothyroidism   . Intracranial aneurysm    in late 1970's  . 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 Neoadjuvant RT by Dr. MYisroel Ramming Surveillance plan: MRI 12/08/11 shows changes in presumed post-op seroma/hematoma with slight increase in size - follow up MRI 11/18/12 showed same size. Will continue with q3 month  MRI. CT C/A/P annually alternating with CXR at 6 months.   . Osteoporosis   . Paroxysmal A-fib ([facility] [place]    a. Dx 01/2013 - placed on xarelto;  b. 03/2013 s/p left sided MAZE and LA clip @ time of AVR/CABG.  . Peripheral neuropathy   . PMR (polymyalgia rheumatica) ([facility])    Inactive  . Rheumatic heart disease   . 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 Vertigo     Past Surgical History:  Procedure Laterality Date  . ABDOMINAL HYSTERECTOMY    . ASCENDING AORTIC ROOT REPLACEMENT N/A 03/31/2013   Procedure: ASCENDING AORTIC ROOT REPLACEMENT;  Surgeon: EGrace Isaac MD;  [facility]  Service: Open Heart Surgery;  Laterality: N/A; clip inserted.  .Marland KitchenBLADDER SUSPENSION    . CARDIOVERSION N/A 01/29/2017   Procedure: CARDIOVERSION;  Surgeon: MJerline Pain MD;  [facility]  Service: Cardiovascular;  Laterality: N/A;  . CARDIOVERSION N/A 10/22/2017   Procedure: CARDIOVERSION;  Surgeon: CLelon Perla MD;  [facility];  Service: Cardiovascular;  Laterality: N/A;  . COLONOSCOPY  10/25/2011   Procedure: COLONOSCOPY;  Surgeon: MGarlan Fair MD;  Location: [facility];  Service: Endoscopy;  Laterality: N/A;  . EYE SURGERY Bilateral    cataracts  . INTRAOPERATIVE TRANSESOPHAGEAL ECHOCARDIOGRAM N/A 03/31/2013   Procedure: INTRAOPERATIVE TRANSESOPHAGEAL ECHOCARDIOGRAM;  Surgeon: EGrace Isaac MD;  [facility]  Service: Open Heart Surgery;  Laterality: N/A;  . LEFT AND RIGHT HEART CATHETERIZATION WITH CORONARY ANGIOGRAM Right 02/02/2013   Procedure: LEFT AND RIGHT HEART CATHETERIZATION WITH CORONARY ANGIOGRAM;  Surgeon: THillary Bow MD;  [facility];  Service: Cardiovascular;  Laterality: Right;  .Marland KitchenMAZE N/A 03/31/2013   Procedure: MAZE;  Surgeon: EGrace Isaac MD;  [facility]  Service: Open Heart Surgery;  Laterality: N/A;  . Sarcoma resection  2011    MEDICATIONS: . acetaminophen (TYLENOL) 500 MG  tablet  . ALPRAZolam (XANAX) 0.5 MG tablet  . amiodarone (PACERONE) 200 MG tablet  . apixaban (ELIQUIS) 2.5 MG TABS tablet  . Ascorbic Acid (VITAMIN C PO)  . Calcium Carbonate-Vitamin D (CALCIUM + D PO)  . Cholecalciferol (VITAMIN D3) 2000 units capsule  . CRANBERRY PO  . furosemide (Kayla Chambers) 40 MG tablet  . lactase (LACTAID) 3000 UNITS tablet  . levothyroxine (SYNTHROID, LEVOTHROID) 75 MCG tablet  . Loperamide HCl (LOPERAMIDE A-D PO)  . Multiple Vitamin (MULTIVITAMIN WITH MINERALS) TABS  .  naproxen sodium (ALEVE) 220 MG tablet  . Omega-3 Fatty Acids (FISH OIL PO)  . ondansetron (ZOFRAN) 4 MG tablet  . oxyCODONE (OXY IR/ROXICODONE) 5 MG immediate release tablet  . polyethylene glycol (MIRALAX / GLYCOLAX) packet  . potassium chloride SA (K-DUR,KLOR-CON) 20 MEQ tablet  . pravastatin (PRAVACHOL) 40 MG tablet  . Probiotic Product (PROBIOTIC PO)  . rOPINIRole (REQUIP) 0.25 MG tablet   No current facility-administered medications for this encounter.     George Hugh [facility] Short Stay Center/Anesthesiology Phone [phone] 05/02/2018 3:17 PM

## 2018-05-02 NOTE — Progress Notes (Signed)
PCP - Dr. Donald Prose Cardiologist - Dr. Stanford Breed  Chest x-ray -02/20/2018  EKG - 04/10/2018 Stress Test - patient denies ECHO - 2018 Cardiac Cath - 2014  Sleep Study - patient denies   Blood Thinner Instructions: Eliquis Aspirin Instructions: Per Dr. Stanford Breed, pt is to stop Eliquis 3 days prior to surgery.   Anesthesia review: Yes  Patient denies shortness of breath, fever, cough and chest pain at PAT appointment   Patient verbalized understanding of instructions that were given to them at the PAT appointment. Patient was also instructed that they will need to review over the PAT instructions again at home before surgery.

## 2018-05-02 NOTE — Anesthesia Preprocedure Evaluation (Addendum)
Anesthesia Evaluation  Patient identified by MRN, date of birth, ID band Patient awake    Reviewed: Allergy & Precautions, NPO status , Patient's Chart, lab work & pertinent test results  Airway Mallampati: II  TM Distance: >3 FB Neck ROM: Full    Dental no notable dental hx.    Pulmonary neg pulmonary ROS,    Pulmonary exam normal breath sounds clear to auscultation       Cardiovascular hypertension, + CAD and + Peripheral Vascular Disease  negative cardio ROS Normal cardiovascular exam Rhythm:Regular Rate:Normal     Neuro/Psych  Headaches, Anxiety Dementia negative neurological ROS  negative psych ROS   GI/Hepatic negative GI ROS, Neg liver ROS,   Endo/Other  negative endocrine ROSHypothyroidism   Renal/GU negative Renal ROS  negative genitourinary   Musculoskeletal negative musculoskeletal ROS (+) Fibromyalgia -  Abdominal   Peds negative pediatric ROS (+)  Hematology negative hematology ROS (+)   Anesthesia Other Findings   Reproductive/Obstetrics negative OB ROS                             Anesthesia Physical Anesthesia Plan  ASA: III  Anesthesia Plan: MAC   Post-op Pain Management:    Induction: Intravenous  PONV Risk Score and Plan: 2 and Ondansetron and Treatment may vary due to age or medical condition  Airway Management Planned: Simple Face Mask  Additional Equipment:   Intra-op Plan:   Post-operative Plan:   Informed Consent: I have reviewed the patients History and Physical, chart, labs and discussed the procedure including the risks, benefits and alternatives for the proposed anesthesia with the patient or authorized representative who has indicated his/her understanding and acceptance.   Dental advisory given  Plan Discussed with: CRNA  Anesthesia Plan Comments:        Anesthesia Quick Evaluation

## 2018-05-05 NOTE — H&P (Addendum)
Patient ID: ARVILLA SALADA MRN: [id number] DOB/AGE: Mar 10, 1927 84 y.o.  Admit date: (Not on file)  Admission Diagnoses:  Lumbar three fracture  HPI: The patient is here today for a pre-operative History and Physical. They are scheduled for Kyphoplasty of L3 on 05-07-18 with Dr. Rolena Infante at  [facility] [address].  Pt has a hx of heart surgery for her aortic valve in 2014.  Pt continues to be on oxy 5 q4.  Past Medical History: Past Medical History:  Diagnosis Date  . Anxiety    takes xanax for sleep.   . Aortic insufficiency    a. 03/2013 s/p  AVR w/ biologic 2mm Magna Ease peric tissue valve, model 300TFX, ser# [id number].   . Back pain    BACK BRACE/FRACTURE  . CAD (coronary artery disease)    a. 01/2013 mod calcification w/o sev dzs;  b. 03/2013 CABG x1 VG->RCA @ time of AVR  . Chronic diastolic CHF (congestive heart failure) ([place])    a. 01/2013 echo: EF 50-55%.  . Dementia   . Dilated aortic root ([place])    a. 03/2013 s/p [place] procedure with Ao Root replacement (48mm Valsalva graft) w/ reimplantationof the R and L coronary ostium.  Marland Kitchen Dysrhythmia    A. FIB  . Fibromyalgia   . Headache(784.0)    hx migraines  . Hypothyroidism   . Intracranial aneurysm    in late 1970's  . 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 Neoadjuvant RT by Dr. Yisroel Ramming. Surveillance plan: MRI 12/08/11 shows changes in presumed post-op seroma/hematoma with slight increase in size - follow up MRI 11/18/12 showed same size. Will continue with q3 month MRI. CT C/A/P annually alternating with CXR at 6 months.   . Osteoporosis   . Paroxysmal A-fib ([place])    a. Dx 01/2013 - placed on xarelto;  b. 03/2013 s/p left sided MAZE and LA clip @ time of AVR/CABG.  . Peripheral neuropathy   . PMR (polymyalgia rheumatica) ([facility])    Inactive  . Rheumatic heart disease   . 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 Vertigo     Surgical History: Past Surgical History:  Procedure Laterality Date  . ABDOMINAL HYSTERECTOMY    . ASCENDING AORTIC ROOT REPLACEMENT N/A 03/31/2013   Procedure: ASCENDING AORTIC ROOT REPLACEMENT;  Surgeon: Grace Isaac, MD;  Location: [place];  Service: Open Heart Surgery;  Laterality: N/A; clip inserted.  Marland Kitchen BLADDER SUSPENSION    . CARDIOVERSION N/A 01/29/2017   Procedure: CARDIOVERSION;  Surgeon: Jerline Pain, MD;  Location: [place];  Service: Cardiovascular;  Laterality: N/A;  . CARDIOVERSION N/A 10/22/2017   Procedure: CARDIOVERSION;  Surgeon: Lelon Perla, MD;  Location: [facility] ENDOSCOPY;  Service: Cardiovascular;  Laterality: N/A;  . COLONOSCOPY  10/25/2011   Procedure: COLONOSCOPY;  Surgeon: Garlan Fair, MD;  Location: [facility] ENDOSCOPY;  Service: Endoscopy;  Laterality: N/A;  . EYE SURGERY Bilateral    cataracts  . INTRAOPERATIVE TRANSESOPHAGEAL ECHOCARDIOGRAM N/A 03/31/2013   Procedure: INTRAOPERATIVE TRANSESOPHAGEAL ECHOCARDIOGRAM;  Surgeon: Grace Isaac, MD;  Location: [place];  Service: Open Heart Surgery;  Laterality: N/A;  . LEFT AND RIGHT HEART CATHETERIZATION WITH CORONARY ANGIOGRAM Right 02/02/2013   Procedure: LEFT AND RIGHT HEART CATHETERIZATION WITH CORONARY ANGIOGRAM;  Surgeon: Hillary Bow, MD;  Location: [facility] CATH LAB;  Service: Cardiovascular;  Laterality: Right;  Marland Kitchen MAZE N/A 03/31/2013   Procedure: MAZE;  Surgeon: Grace Isaac, MD;  Location: [place];  Service: Open Heart Surgery;  Laterality: N/A;  . Sarcoma resection  2011    Family History: Family History  Problem Relation Age of Onset  . Heart disease Mother   . Stroke Mother   . Arthritis Father   . Cancer Father   . Anesthesia problems Brother   . Hypertension Sister     Social History: Social History   Socioeconomic History  . Marital status: Married    Spouse name: Not on file  . Number of children: Not on file  . Years of education: Not on  file  . Highest education level: Not on file  Occupational History  . Not on file  Social Needs  . Financial resource strain: Not on file  . Food insecurity:    Worry: Not on file    Inability: Not on file  . Transportation needs:    Medical: Not on file    Non-medical: Not on file  Tobacco Use  . Smoking status: Never Smoker  . Smokeless tobacco: Never Used  Substance and Sexual Activity  . Alcohol use: No  . Drug use: No  . Sexual activity: Yes  Lifestyle  . Physical activity:    Days per week: Not on file    Minutes per session: Not on file  . Stress: Not on file  Relationships  . Social connections:    Talks on phone: Not on file    Gets together: Not on file    Attends religious service: Not on file    Active member of club or organization: Not on file    Attends meetings of clubs or organizations: Not on file    Relationship status: Not on file  . Intimate partner violence:    Fear of current or ex partner: Not on file    Emotionally abused: Not on file    Physically abused: Not on file    Forced sexual activity: Not on file  Other Topics Concern  . Not on file  Social History Narrative  . Not on file    Allergies: Alendronate sodium; Ambien [zolpidem tartrate]; Lac bovis; Lyrica [pregabalin]; Milk-related compounds; Neurontin [gabapentin]; Procaine hcl; and Toprol xl [metoprolol succinate]  Medications: I have reviewed the patient's current medications.  Vital Signs: No data found.  Radiology: Dg Lumbar Spine Complete  Result Date: 04/09/2018 CLINICAL DATA:  Status post fall EXAM: LUMBAR SPINE - COMPLETE 4+ VIEW COMPARISON:  None. FINDINGS: Chronic L4 vertebral body compression fracture with approximately 50% height loss. L3 vertebral body compression fracture with mild progressive height loss compared with 02/09/2016 concerning for acute on subacute compression fracture. Degenerative disc disease with disc height loss at L1-2 and L2-3. Minimal  retrolisthesis of L2 on L3. Severe bilateral facet arthropathy at L4-5 and L5-S1. IMPRESSION: L3 vertebral body compression fracture with mild progressive height loss compared with 02/09/2016 concerning for acute on subacute compression fracture. Electronically Signed   By: Kathreen Devoid   On: 04/09/2018 09:30   Mr Lumbar Spine Wo Contrast  Result Date: 04/09/2018 CLINICAL DATA:  Initial evaluation for acute trauma, fall. Evaluate for possible fracture. EXAM: MRI LUMBAR SPINE WITHOUT CONTRAST TECHNIQUE: Multiplanar, multisequence MR imaging of the lumbar spine was performed. No intravenous contrast was administered. COMPARISON:  Prior CT and radiograph from earlier same day. FINDINGS: Segmentation: Normal segmentation. Lowest well-formed disc labeled the L5-S1 level. Alignment: Mild dextroscoliosis.  6 mm retrolisthesis of L2 on L3, with trace 2-3 mm retrolisthesis of L3 on L4 and L4 on L5. Vertebrae: Linear T1 hypointense, T2/STIR hyperintense signal intensity extending through the mid/upper aspect of the L3 vertebral body, consistent with acute compression fracture. Mild central height loss of 30% without significant bony retropulsion. This is benign/mechanical in appearance with no underlying pathologic lesion. Compression deformity involving the L4 vertebral body age is chronic in appearance, relatively similar as compared to previous CT from 01/02/2016. Mild edema within the L4 vertebral body felt to be related to prominent inferior endplate Schmorl's node. Vertebral body heights otherwise maintained without evidence for acute or chronic fracture. Trace edema within the S3 segment felt to be either degenerative in nature or possibly related to atypical hemangioma. Underlying bone marrow signal intensity diffusely heterogeneous without worrisome osseous mass. Conus medullaris and cauda equina: Conus extends to the T12-L1 level. Conus and cauda equina appear normal. Paraspinal and other soft tissues:  Paraspinous soft tissues demonstrate no acute abnormality. Chronic atrophy noted within the lower paraspinous musculature. Subcentimeter T2 hyperintense cyst noted within the posterior left kidney. Visualized visceral structures otherwise unremarkable. Disc levels: L1-2: Mild diffuse disc bulge. Superimposed tiny left paracentral disc extrusion with superior migration. No significant canal or foraminal stenosis. L2-3: 6 mm retrolisthesis. Mild diffuse disc bulge. Moderate facet and ligament flavum hypertrophy. Resultant moderate left with mild right lateral recess narrowing without significant canal stenosis. Foramina remain patent. L3-4: Trace retrolisthesis. Diffuse disc bulge. Moderate facet and ligamentum flavum hypertrophy, slightly worse on the left. Resultant mild left lateral recess narrowing without significant canal stenosis. Mild left L3 foraminal stenosis. L4-5: 5 mm bony retropulsion related to the chronic L4 compression fracture. Mild diffuse disc bulge. Reactive endplate changes with endplate osteophytic spurring. Moderate to advanced facet and ligament flavum hypertrophy. Resultant moderate canal with bilateral subarticular stenosis. Moderate left with mild right L4 foraminal stenosis. L5-S1: Negative interspace. Mild to moderate facet hypertrophy, slightly worse on the left. No stenosis. IMPRESSION: 1. Acute compression fracture involving the upper/central L3 vertebral body with mild central height loss of up to 30% without bony retropulsion. 2. Chronic compression deformity of L4 with associated 5 mm bony retropulsion. 3. Multifactorial degenerative changes at L4-5 with resultant moderate canal and bilateral subarticular stenosis, with moderate left L4 foraminal narrowing. Electronically Signed   By: Jeannine Boga M.D.   On: 04/09/2018 19:56   Ct Abdomen Pelvis W Contrast  Result Date: 04/09/2018 CLINICAL DATA:  Lower back pain after fall yesterday. EXAM: CT ABDOMEN AND PELVIS WITH  CONTRAST TECHNIQUE: Multidetector CT imaging of the abdomen and pelvis was performed using the standard protocol following bolus administration of intravenous contrast. CONTRAST:  160mL ISOVUE-300 IOPAMIDOL (ISOVUE-300) INJECTION 61% COMPARISON:  None. FINDINGS: Lower chest: No acute abnormality. Hepatobiliary: No gallstones are noted. Mildly nodular hepatic contours are noted suggesting hepatic cirrhosis. 1 cm enhancing abnormality is noted in left hepatic lobe. No biliary dilatation is noted. Pancreas: Unremarkable. No pancreatic ductal dilatation or surrounding inflammatory changes. Spleen: Normal in size without focal abnormality. Adrenals/Urinary Tract: Adrenal glands are unremarkable. Kidneys are normal, without renal calculi, focal lesion, or hydronephrosis. Bladder is unremarkable. Stomach/Bowel: Stomach is within normal limits. Appendix appears normal. No evidence of bowel wall thickening, distention, or inflammatory changes. Sigmoid diverticulosis is noted without inflammation. Vascular/Lymphatic: Aortic atherosclerosis. No enlarged abdominal or pelvic lymph nodes. Reproductive: Status post hysterectomy. No adnexal masses. Other: No abdominal wall hernia or abnormality. No abdominopelvic ascites. Musculoskeletal: No acute or significant osseous findings.  IMPRESSION: Mildly nodular hepatic contours are noted concerning for cirrhosis. 1 cm enhancing abnormality is noted in left hepatic lobe; this is not diagnostic for hemangioma on the basis of this exam, and further evaluation with MRI of the liver with and without gadolinium is recommended to rule out other pathology. Sigmoid diverticulosis without inflammation. No evidence of traumatic injury seen in the abdomen or pelvis. Aortic Atherosclerosis (ICD10-I70.0). Electronically Signed   By: Marijo Conception, M.D.   On: 04/09/2018 11:56    Labs: Recent Labs    05/02/18 1211  WBC 7.9  RBC 4.42  HCT 42.0  PLT 202   Recent Labs    05/02/18 1211  NA  137  K 4.6  CL 101  CO2 26  BUN 21*  CREATININE 0.90  GLUCOSE 105*  CALCIUM 10.0   Recent Labs    05/02/18 1211  INR 1.27    Review of Systems: ROS  Physical Exam: There is no height or weight on file to calculate BMI. Peripheral pulses: 1+ symmetrical in the lower extremity. Compartment soft and nontender.  Gait pattern: Able to stand but has horrific pain when she attempts to walk.  She is currently using a wheelchair.    Neuro: No focal motor or sensory deficits in the lower extremity.  Negative Babinski test, no clonus.  1+ symmetrical deep tendon reflexes at the knee and Achilles.  Musculoskeletal: Horrific back pain with palpation.  Significant pain when she tries to stand.  No SI joint pain.  Pain localized to the mid lumbar spine.  No significant hip, knee, ankle pain.  MRI of the lumbar spine completed on 04/09/18 shows an acute L3 compression fracture with approximate 30% central loss of height.  No retropulsion.  Chronic compression deformity of L4.  No high-grade spinal stenosis but multilevel degenerative disc disease and degenerative listhesis.  She also has severe bilateral facet arthropathy.  X-rays demonstrate the L3 compression fracture.  X-rays taken today in my office in significant compression of the L3 vertebral body.  It is not complete vertebral plana but there is marked compression compared to the 04/09/18 x-rays. Physical Exam  Constitutional: She is oriented to person, place, and time. She appears well-nourished.  Cardiovascular: Normal rate and regular rhythm.  Respiratory: Effort normal and breath sounds normal.  GI: Soft. Bowel sounds are normal.  Neurological: She is alert and oriented to person, place, and time.  Skin: Skin is warm and dry.  Psychiatric: She has a normal mood and affect. Her behavior is normal. Judgment and thought content normal.     Assessment and Plan: Risks and benefits of surgery were discussed with the patient. These  include: Infection, bleeding, death, stroke, paralysis, ongoing or worse pain, need for additional surgery, leak of spinal fluid, adjacent segment degeneration requiring additional surgery, post-operative hematoma formation that can result in neurological compromise and the need for urgent/emergent re-operation.   Goal of surgery: Reduce (not eliminate) pain, and improve quality of life.   Ronette Deter, PAC for Melina Schools, MD [facility] [phone]  Patient's clinical exam is essentially unchanged this morning.  She continues to have horrific back pain which is significantly hindering her quality of life.  I have again reiterated the fact that from a technical standpoint we may not be able to move forward with surgery but we would not know until the patient was in the operating room and imaging studies were done.  I have also gone over the risks and benefits of  the procedure.  All their questions were encouraged and addressed.  Plan on moving forward with the L3 kyphoplasty.

## 2018-05-06 DIAGNOSIS — I0981 Rheumatic heart failure: Secondary | ICD-10-CM | POA: Diagnosis not present

## 2018-05-06 DIAGNOSIS — M8008XA Age-related osteoporosis with current pathological fracture, vertebra(e), initial encounter for fracture: Secondary | ICD-10-CM | POA: Diagnosis not present

## 2018-05-07 ENCOUNTER — Ambulatory Visit (HOSPITAL_COMMUNITY): Payer: Medicare Other | Admitting: Vascular Surgery

## 2018-05-07 ENCOUNTER — Other Ambulatory Visit: Payer: Self-pay

## 2018-05-07 ENCOUNTER — Ambulatory Visit (HOSPITAL_COMMUNITY): Payer: Medicare Other | Admitting: Certified Registered Nurse Anesthetist

## 2018-05-07 ENCOUNTER — Observation Stay (HOSPITAL_COMMUNITY)
Admission: RE | Admit: 2018-05-07 | Discharge: 2018-05-08 | Disposition: A | Discharge status: Home / Self Care | Payer: Medicare Other | Source: Ambulatory Visit | Attending: Orthopedic Surgery | Admitting: Orthopedic Surgery

## 2018-05-07 ENCOUNTER — Encounter (HOSPITAL_COMMUNITY)
Admission: RE | Disposition: A | Discharge status: Home / Self Care | Payer: Self-pay | Source: Ambulatory Visit | Attending: Orthopedic Surgery

## 2018-05-07 ENCOUNTER — Encounter (HOSPITAL_COMMUNITY): Payer: Self-pay | Admitting: Certified Registered Nurse Anesthetist

## 2018-05-07 ENCOUNTER — Ambulatory Visit (HOSPITAL_COMMUNITY): Payer: Medicare Other

## 2018-05-07 DIAGNOSIS — Z9889 Other specified postprocedural states: Secondary | ICD-10-CM

## 2018-05-07 DIAGNOSIS — Z951 Presence of aortocoronary bypass graft: Secondary | ICD-10-CM | POA: Insufficient documentation

## 2018-05-07 DIAGNOSIS — M8088XA Other osteoporosis with current pathological fracture, vertebra(e), initial encounter for fracture: Principal | ICD-10-CM | POA: Insufficient documentation

## 2018-05-07 DIAGNOSIS — M797 Fibromyalgia: Secondary | ICD-10-CM | POA: Insufficient documentation

## 2018-05-07 DIAGNOSIS — I480 Paroxysmal atrial fibrillation: Secondary | ICD-10-CM | POA: Diagnosis not present

## 2018-05-07 DIAGNOSIS — Z419 Encounter for procedure for purposes other than remedying health state, unspecified: Secondary | ICD-10-CM

## 2018-05-07 DIAGNOSIS — M8008XA Age-related osteoporosis with current pathological fracture, vertebra(e), initial encounter for fracture: Secondary | ICD-10-CM | POA: Diagnosis not present

## 2018-05-07 DIAGNOSIS — Z79899 Other long term (current) drug therapy: Secondary | ICD-10-CM | POA: Insufficient documentation

## 2018-05-07 DIAGNOSIS — G629 Polyneuropathy, unspecified: Secondary | ICD-10-CM | POA: Diagnosis not present

## 2018-05-07 DIAGNOSIS — I2510 Atherosclerotic heart disease of native coronary artery without angina pectoris: Secondary | ICD-10-CM | POA: Insufficient documentation

## 2018-05-07 DIAGNOSIS — F419 Anxiety disorder, unspecified: Secondary | ICD-10-CM | POA: Diagnosis not present

## 2018-05-07 DIAGNOSIS — M353 Polymyalgia rheumatica: Secondary | ICD-10-CM | POA: Diagnosis not present

## 2018-05-07 DIAGNOSIS — E039 Hypothyroidism, unspecified: Secondary | ICD-10-CM | POA: Diagnosis not present

## 2018-05-07 DIAGNOSIS — M4856XA Collapsed vertebra, not elsewhere classified, lumbar region, initial encounter for fracture: Secondary | ICD-10-CM | POA: Diagnosis not present

## 2018-05-07 DIAGNOSIS — Z981 Arthrodesis status: Secondary | ICD-10-CM | POA: Diagnosis not present

## 2018-05-07 DIAGNOSIS — Z888 Allergy status to other drugs, medicaments and biological substances status: Secondary | ICD-10-CM | POA: Insufficient documentation

## 2018-05-07 DIAGNOSIS — I5032 Chronic diastolic (congestive) heart failure: Secondary | ICD-10-CM | POA: Diagnosis not present

## 2018-05-07 DIAGNOSIS — E785 Hyperlipidemia, unspecified: Secondary | ICD-10-CM | POA: Diagnosis not present

## 2018-05-07 DIAGNOSIS — Z79890 Hormone replacement therapy (postmenopausal): Secondary | ICD-10-CM | POA: Insufficient documentation

## 2018-05-07 HISTORY — PX: KYPHOPLASTY: SHX5884

## 2018-05-07 LAB — PROTIME-INR
INR: 0.99
Prothrombin Time: 13 seconds (ref 11.4–15.2)

## 2018-05-07 SURGERY — KYPHOPLASTY
Anesthesia: Monitor Anesthesia Care | Site: Back

## 2018-05-07 MED ORDER — BUPIVACAINE-EPINEPHRINE 0.25% -1:200000 IJ SOLN
INTRAMUSCULAR | Status: DC | PRN
  Administered 2018-05-07: 30 mL

## 2018-05-07 MED ORDER — MENTHOL 3 MG MT LOZG: 1.0000 | LOZENGE | OROMUCOSAL | Status: DC | PRN

## 2018-05-07 MED ORDER — ONDANSETRON HCL 4 MG PO TABS: 4.0000 mg | ORAL_TABLET | Freq: Four times a day (QID) | ORAL | Status: DC | PRN

## 2018-05-07 MED ORDER — IOPAMIDOL (ISOVUE-300) INJECTION 61%
INTRAVENOUS | Status: DC | PRN
  Administered 2018-05-07: 50 mL

## 2018-05-07 MED ORDER — OXYCODONE HCL 5 MG PO TABS: 5.0000 mg | ORAL_TABLET | Freq: Once | ORAL | Status: DC | PRN

## 2018-05-07 MED ORDER — METHOCARBAMOL 1000 MG/10ML IJ SOLN
500.0000 mg | Freq: Four times a day (QID) | INTRAMUSCULAR | Status: DC | PRN
  Filled 2018-05-07: qty 5

## 2018-05-07 MED ORDER — CEFAZOLIN SODIUM-DEXTROSE 2-4 GM/100ML-% IV SOLN
INTRAVENOUS | Status: AC
  Filled 2018-05-07: qty 100

## 2018-05-07 MED ORDER — IOPAMIDOL (ISOVUE-300) INJECTION 61%
INTRAVENOUS | Status: AC
  Filled 2018-05-07: qty 50

## 2018-05-07 MED ORDER — LEVOTHYROXINE SODIUM 75 MCG PO TABS
75.0000 ug | ORAL_TABLET | Freq: Every day | ORAL | Status: DC
Start: 2018-05-07 — End: 2018-05-08
  Administered 2018-05-07: 75 ug via ORAL
  Filled 2018-05-07: qty 1

## 2018-05-07 MED ORDER — ONDANSETRON HCL 4 MG/2ML IJ SOLN
INTRAMUSCULAR | Status: DC | PRN
  Administered 2018-05-07: 4 mg via INTRAVENOUS

## 2018-05-07 MED ORDER — AMIODARONE HCL 200 MG PO TABS
200.0000 mg | ORAL_TABLET | Freq: Every day | ORAL | Status: DC
  Administered 2018-05-08: 200 mg via ORAL
  Filled 2018-05-07: qty 1

## 2018-05-07 MED ORDER — ONDANSETRON HCL 4 MG/2ML IJ SOLN: 4.0000 mg | Freq: Four times a day (QID) | INTRAMUSCULAR | Status: DC | PRN

## 2018-05-07 MED ORDER — SUGAMMADEX SODIUM 200 MG/2ML IV SOLN
INTRAVENOUS | Status: AC
  Filled 2018-05-07: qty 2

## 2018-05-07 MED ORDER — ONDANSETRON 4 MG PO TBDP: 4.0000 mg | ORAL_TABLET | Freq: Three times a day (TID) | ORAL | 0 refills | Status: DC | PRN

## 2018-05-07 MED ORDER — ROPINIROLE HCL 0.25 MG PO TABS
0.2500 mg | ORAL_TABLET | Freq: Every evening | ORAL | Status: DC | PRN
  Filled 2018-05-07: qty 1

## 2018-05-07 MED ORDER — BUPIVACAINE-EPINEPHRINE (PF) 0.25% -1:200000 IJ SOLN
INTRAMUSCULAR | Status: AC
  Filled 2018-05-07: qty 30

## 2018-05-07 MED ORDER — ONDANSETRON HCL 4 MG/2ML IJ SOLN
INTRAMUSCULAR | Status: AC
  Filled 2018-05-07: qty 2

## 2018-05-07 MED ORDER — CEFAZOLIN SODIUM-DEXTROSE 2-4 GM/100ML-% IV SOLN
2.0000 g | Freq: Three times a day (TID) | INTRAVENOUS | Status: AC
  Administered 2018-05-07 – 2018-05-08 (×2): 2 g via INTRAVENOUS
  Filled 2018-05-07 (×2): qty 100

## 2018-05-07 MED ORDER — SODIUM CHLORIDE 0.9% FLUSH: 3.0000 mL | INTRAVENOUS | Status: DC | PRN

## 2018-05-07 MED ORDER — FENTANYL CITRATE (PF) 250 MCG/5ML IJ SOLN
INTRAMUSCULAR | Status: DC | PRN
  Administered 2018-05-07: 25 ug via INTRAVENOUS

## 2018-05-07 MED ORDER — FENTANYL CITRATE (PF) 250 MCG/5ML IJ SOLN
INTRAMUSCULAR | Status: AC
  Filled 2018-05-07: qty 5

## 2018-05-07 MED ORDER — HYDROCODONE-ACETAMINOPHEN 5-325 MG PO TABS
1.0000 | ORAL_TABLET | ORAL | Status: DC | PRN
  Administered 2018-05-07 – 2018-05-08 (×4): 1 via ORAL
  Filled 2018-05-07 (×4): qty 1

## 2018-05-07 MED ORDER — SODIUM CHLORIDE 0.9% FLUSH
3.0000 mL | Freq: Two times a day (BID) | INTRAVENOUS | Status: DC
  Administered 2018-05-07: 3 mL via INTRAVENOUS

## 2018-05-07 MED ORDER — OXYCODONE HCL 5 MG/5ML PO SOLN: 5.0000 mg | Freq: Once | ORAL | Status: DC | PRN

## 2018-05-07 MED ORDER — BUPIVACAINE LIPOSOME 1.3 % IJ SUSP
20.0000 mL | Freq: Once | INTRAMUSCULAR | Status: AC
  Administered 2018-05-07: 20 mL
  Filled 2018-05-07: qty 20

## 2018-05-07 MED ORDER — METHOCARBAMOL 500 MG PO TABS
500.0000 mg | ORAL_TABLET | Freq: Four times a day (QID) | ORAL | Status: DC | PRN
  Administered 2018-05-08 (×2): 500 mg via ORAL
  Filled 2018-05-07 (×2): qty 1

## 2018-05-07 MED ORDER — CEFAZOLIN SODIUM-DEXTROSE 2-4 GM/100ML-% IV SOLN
2.0000 g | INTRAVENOUS | Status: AC
  Administered 2018-05-07: 2 g via INTRAVENOUS

## 2018-05-07 MED ORDER — LACTATED RINGERS IV SOLN: INTRAVENOUS | Status: DC

## 2018-05-07 MED ORDER — PROPOFOL 500 MG/50ML IV EMUL
INTRAVENOUS | Status: DC | PRN
  Administered 2018-05-07: 65 ug/kg/min via INTRAVENOUS

## 2018-05-07 MED ORDER — 0.9 % SODIUM CHLORIDE (POUR BTL) OPTIME
TOPICAL | Status: DC | PRN
  Administered 2018-05-07: 1000 mL

## 2018-05-07 MED ORDER — LACTATED RINGERS IV SOLN
INTRAVENOUS | Status: DC
  Administered 2018-05-07: 11:00:00 via INTRAVENOUS
  Administered 2018-05-08: 30 mL via INTRAVENOUS

## 2018-05-07 MED ORDER — HYDROCODONE-ACETAMINOPHEN 5-325 MG PO TABS: 1.0000 | ORAL_TABLET | Freq: Four times a day (QID) | ORAL | 0 refills | Status: AC | PRN

## 2018-05-07 MED ORDER — ROCURONIUM BROMIDE 50 MG/5ML IV SOLN
INTRAVENOUS | Status: AC
  Filled 2018-05-07: qty 3

## 2018-05-07 MED ORDER — PHENYLEPHRINE 40 MCG/ML (10ML) SYRINGE FOR IV PUSH (FOR BLOOD PRESSURE SUPPORT)
PREFILLED_SYRINGE | INTRAVENOUS | Status: AC
  Filled 2018-05-07: qty 10

## 2018-05-07 MED ORDER — ACETAMINOPHEN 650 MG RE SUPP: 650.0000 mg | RECTAL | Status: DC | PRN

## 2018-05-07 MED ORDER — PHENOL 1.4 % MT LIQD: 1.0000 | OROMUCOSAL | Status: DC | PRN

## 2018-05-07 MED ORDER — DOCUSATE SODIUM 100 MG PO CAPS
100.0000 mg | ORAL_CAPSULE | Freq: Two times a day (BID) | ORAL | Status: DC
  Administered 2018-05-07: 100 mg via ORAL
  Filled 2018-05-07: qty 1

## 2018-05-07 MED ORDER — POLYETHYLENE GLYCOL 3350 17 G PO PACK: 17.0000 g | PACK | Freq: Every day | ORAL | Status: DC | PRN

## 2018-05-07 MED ORDER — MAGNESIUM CITRATE PO SOLN: 1.0000 | Freq: Once | ORAL | Status: DC | PRN

## 2018-05-07 MED ORDER — DEXAMETHASONE SODIUM PHOSPHATE 10 MG/ML IJ SOLN
INTRAMUSCULAR | Status: AC
  Filled 2018-05-07: qty 1

## 2018-05-07 MED ORDER — LIDOCAINE 2% (20 MG/ML) 5 ML SYRINGE
INTRAMUSCULAR | Status: AC
  Filled 2018-05-07: qty 10

## 2018-05-07 MED ORDER — PROMETHAZINE HCL 25 MG/ML IJ SOLN: 6.2500 mg | INTRAMUSCULAR | Status: DC | PRN

## 2018-05-07 MED ORDER — APIXABAN 2.5 MG PO TABS: 2.5000 mg | ORAL_TABLET | Freq: Two times a day (BID) | ORAL | 1 refills | Status: DC

## 2018-05-07 MED ORDER — PROPOFOL 10 MG/ML IV BOLUS
INTRAVENOUS | Status: DC | PRN
  Administered 2018-05-07: 10 mg via INTRAVENOUS

## 2018-05-07 MED ORDER — ALPRAZOLAM 0.5 MG PO TABS
0.5000 mg | ORAL_TABLET | Freq: Every evening | ORAL | Status: DC | PRN
  Administered 2018-05-07: 0.5 mg via ORAL
  Filled 2018-05-07: qty 1

## 2018-05-07 MED ORDER — ACETAMINOPHEN 325 MG PO TABS: 650.0000 mg | ORAL_TABLET | ORAL | Status: DC | PRN

## 2018-05-07 MED ORDER — HYDROMORPHONE HCL 1 MG/ML IJ SOLN: 0.2500 mg | INTRAMUSCULAR | Status: DC | PRN

## 2018-05-07 MED ORDER — FUROSEMIDE 40 MG PO TABS
40.0000 mg | ORAL_TABLET | Freq: Every day | ORAL | Status: DC
  Administered 2018-05-08: 40 mg via ORAL
  Filled 2018-05-07 (×2): qty 1

## 2018-05-07 MED ORDER — PRAVASTATIN SODIUM 40 MG PO TABS
40.0000 mg | ORAL_TABLET | Freq: Every evening | ORAL | Status: DC
  Administered 2018-05-07: 40 mg via ORAL
  Filled 2018-05-07: qty 1

## 2018-05-07 SURGICAL SUPPLY — 53 items
ADH SKN CLS APL DERMABOND .7 (GAUZE/BANDAGES/DRESSINGS) ×1
BANDAGE ADH SHEER 1  50/CT (GAUZE/BANDAGES/DRESSINGS) ×6 IMPLANT
BLADE SURG 15 STRL LF DISP TIS (BLADE) ×1 IMPLANT
BLADE SURG 15 STRL SS (BLADE) ×3
BNDG ADH 5X2 AIR PERM ELC (GAUZE/BANDAGES/DRESSINGS) ×1
BNDG COHESIVE 2X5 WHT NS (GAUZE/BANDAGES/DRESSINGS) ×2 IMPLANT
BONE FILLER DEVICE STRL SZ3 (INSTRUMENTS) IMPLANT
CEMENT KYPHON CX01A KIT/MIXER (Cement) ×3 IMPLANT
CONT SPEC 4OZ CLIKSEAL STRL BL (MISCELLANEOUS) ×2 IMPLANT
COVER LIGHT HANDLE  DEROYL (MISCELLANEOUS) ×1 IMPLANT
CURETTE WEDGE 8.5MM KYPHX (MISCELLANEOUS) IMPLANT
DERMABOND ADVANCED (GAUZE/BANDAGES/DRESSINGS) ×2
DERMABOND ADVANCED .7 DNX12 (GAUZE/BANDAGES/DRESSINGS) ×1 IMPLANT
DRAPE C-ARM 42X72 X-RAY (DRAPES) ×6 IMPLANT
DRAPE INCISE IOBAN 66X45 STRL (DRAPES) ×3 IMPLANT
DRAPE LAPAROTOMY T 102X78X121 (DRAPES) ×3 IMPLANT
DRAPE SURG 17X23 STRL (DRAPES) ×1 IMPLANT
DRAPE U-SHAPE 47X51 STRL (DRAPES) ×1 IMPLANT
DURAPREP 26ML APPLICATOR (WOUND CARE) ×3 IMPLANT
GLOVE BIO SURGEON STRL SZ 6.5 (GLOVE) ×2 IMPLANT
GLOVE BIO SURGEONS STRL SZ 6.5 (GLOVE) ×1
GLOVE BIOGEL PI IND STRL 6.5 (GLOVE) ×1 IMPLANT
GLOVE BIOGEL PI IND STRL 8 (GLOVE) IMPLANT
GLOVE BIOGEL PI IND STRL 8.5 (GLOVE) ×1 IMPLANT
GLOVE BIOGEL PI INDICATOR 6.5 (GLOVE) ×4
GLOVE BIOGEL PI INDICATOR 8 (GLOVE) ×4
GLOVE BIOGEL PI INDICATOR 8.5 (GLOVE) ×2
GLOVE ECLIPSE 7.5 STRL STRAW (GLOVE) ×2 IMPLANT
GLOVE SS BIOGEL STRL SZ 8.5 (GLOVE) ×1 IMPLANT
GLOVE SUPERSENSE BIOGEL SZ 8.5 (GLOVE) ×2
GLOVE SURG SS PI 6.0 STRL IVOR (GLOVE) ×2 IMPLANT
GOWN STRL REUS W/ TWL LRG LVL3 (GOWN DISPOSABLE) ×2 IMPLANT
GOWN STRL REUS W/TWL 2XL LVL3 (GOWN DISPOSABLE) ×5 IMPLANT
GOWN STRL REUS W/TWL LRG LVL3 (GOWN DISPOSABLE) ×6
KIT BASIN OR (CUSTOM PROCEDURE TRAY) ×3 IMPLANT
KIT TURNOVER KIT B (KITS) ×3 IMPLANT
NDL HYPO 21X1.5 SAFETY (NEEDLE) IMPLANT
NDL SPNL 18GX3.5 QUINCKE PK (NEEDLE) ×2 IMPLANT
NEEDLE 21X1 OR PACK (NEEDLE) ×3 IMPLANT
NEEDLE HYPO 21X1.5 SAFETY (NEEDLE) ×3 IMPLANT
NEEDLE SPNL 18GX3.5 QUINCKE PK (NEEDLE) IMPLANT
NS IRRIG 1000ML POUR BTL (IV SOLUTION) ×3 IMPLANT
PACK SURGICAL SETUP 50X90 (CUSTOM PROCEDURE TRAY) ×3 IMPLANT
PACK UNIVERSAL I (CUSTOM PROCEDURE TRAY) ×3 IMPLANT
PAD ARMBOARD 7.5X6 YLW CONV (MISCELLANEOUS) ×8 IMPLANT
SPONGE LAP 4X18 RFD (DISPOSABLE) ×3 IMPLANT
SUT MNCRL AB 3-0 PS2 18 (SUTURE) ×3 IMPLANT
SYR 20CC LL (SYRINGE) ×2 IMPLANT
SYR CONTROL 10ML LL (SYRINGE) ×3 IMPLANT
TOWEL OR 17X26 10 PK STRL BLUE (TOWEL DISPOSABLE) ×3 IMPLANT
TRAY KYPHOPAK 15/3 ONESTEP 1ST (MISCELLANEOUS) ×2 IMPLANT
TRAY KYPHOPAK 20/3 ONESTEP 1ST (MISCELLANEOUS) IMPLANT
WATER STERILE IRR 1000ML POUR (IV SOLUTION) ×3 IMPLANT

## 2018-05-07 NOTE — Transfer of Care (Signed)
Immediate Anesthesia Transfer of Care Note  Patient: Kayla Chambers  Procedure(s) Performed: KYPHOPLASTY LUMBAR THREE (N/A Back)  Patient Location: PACU  Anesthesia Type:MAC  Level of Consciousness: awake, alert , oriented and patient cooperative  Airway & Oxygen Therapy: Patient Spontanous Breathing  Post-op Assessment: Report given to RN and Post -op Vital signs reviewed and stable  Post vital signs: Reviewed and stable  Last Vitals:  Vitals Value Taken Time  BP    Temp    Pulse    Resp    SpO2      Last Pain:  Vitals:   05/07/18 1120  TempSrc:   PainSc: 0-No pain      Patients Stated Pain Goal: 3 (92/01/00 7121)  Complications: No apparent anesthesia complications

## 2018-05-07 NOTE — Brief Op Note (Signed)
05/07/2018  1:15 PM  PATIENT:  Kayla Chambers  84 y.o. female  PRE-OPERATIVE DIAGNOSIS:  Lumbar three Compression Fracture  POST-OPERATIVE DIAGNOSIS:  Lumbar three Compression Fracture  PROCEDURE:  Procedure(s): KYPHOPLASTY LUMBAR THREE (N/A)  SURGEON:  Surgeon(s) and Role:    Melina Schools, MD - Primary  PHYSICIAN ASSISTANT:   ASSISTANTS: Carmen Mayo   ANESTHESIA:   local and IV sedation  EBL:  none   BLOOD ADMINISTERED:none  DRAINS: none   LOCAL MEDICATIONS USED:  MARCAINE    and OTHER exparel  SPECIMEN:  No Specimen  DISPOSITION OF SPECIMEN:  N/A  COUNTS:  YES  TOURNIQUET:  * No tourniquets in log *  DICTATION: .Dragon Dictation  PLAN OF CARE: Admit for overnight observation  PATIENT DISPOSITION:  PACU - hemodynamically stable.

## 2018-05-07 NOTE — Op Note (Signed)
Operative report  Preoperative diagnosis: Acute L3 osteoporotic compression fracture  Postoperative diagnosis: Same  Operative procedure: L3 kyphoplasty  First assistant: Ronette Deter, PA  Indications: This is a very pleasant 84 year old woman who is having progressive debilitating lumbar spine pain.  Imaging studies demonstrate an acute to subacute L3 osteoporotic compression fracture.  Attempts at conservative management consisting of medications and activity modification had failed to eliminate her pain.  As a result she elected to move forward with surgery.  All appropriate risks benefits and alternatives were discussed with the patient and family and consent was obtained.  Operative report patient was brought the operating room and turned prone onto the operative table.  All bony prominences well-padded and the patient confirmed that she was comfortable.  Lumbar spine was prepped and draped in a standard fashion.  Timeout was taken to confirm patient procedure and all other important data.  IV sedation was provided by anesthesia.  Once the patient confirmed that she was comfortable we proceeded with the surgery.  I identified the L3 vertebral body in both the AP and lateral planes.  Once I had satisfactory images of the vertebrae I then marked out the lateral wall of the pedicle.  I infiltrated this area copiously with a core combination of quarter percent Marcaine with Exparel.  A total of 50 cc were used to provide adequate analgesia not only on the surface but also in the deep tissue and in the periosteum.  I then placed the Jamshidi needle along the lateral border of the pedicle and confirmed satisfactory position in both planes.  I advanced the Jamshidi needle into the L3 pedicle and advanced it down to the medial border of the pedicle based on the AP image.  Once I was nearing the medial border I checked my lateral to ensure that I was just beyond the posterior wall the vertebral body.  This  point I confirmed trajectory of my Jamshidi needle.  This entire procedure was repeated on the contralateral side.  I then placed my drill and then probed with the tamp to ensure that I had solid bone.  Once this was complete I inserted the inflatable bone tamp and inflated using fluoroscopy guidance.  I then deflated 1 balloon and inserted the cement deflated the contralateral and inserted cement.  A total of cement on each side was used.  I was able to get an excellent fill in the vertebral body.  There was actually some restoration of height.  Once the cement was allowed to harden and removed the trocar.  The skin edges were cleaned, and closed with a simple 3-0 Monocryl stitch.  Dermabond and a Band-Aid were applied.  Patient was then transferred back to the PACU without incident.  Throughout the case the patient remained hemodynamically and neurovascularly intact.  She was resting comfortably with no significant pain.  No intraoperative complications, all counts were correct.

## 2018-05-07 NOTE — Evaluation (Signed)
Occupational Therapy Evaluation Patient Details Name: Kayla Chambers MRN: [id number] DOB: July 14, 1927 Today's Date: 05/07/2018    History of Present Illness 84 yo female PMH including anxiety, CAD, CHF, Dementia, Fibromyalgia, and peripheral neuropathy. S/p kyphoplasty lumbar three.   Clinical Impression   PTA, pt was living with her husband and reports to be independent with ADLs and using RW for functional mobiltiy. Currently, pt requires Min A for UB ADLs, Mod A for LB ADLs, and functional mobility with Min A without DME. Provided education on LB ADLs and toilet transfer with BSC; pt demonstrated understanding. Pt with decreased adherence to back precautions due to baseline memory deficits; would benefit form back brace to increase adherence to precautions. Pt would benefit from further acute OT to facilitate safe dc. Recommend dc to home with HHOT for further OT to optimize safety, independence with ADLs, and return to PLOF.       Follow Up Recommendations  Home health OT;Supervision/Assistance - 24 hour    Equipment Recommendations  None recommended by OT(Pending confirmation from family on DME)    Recommendations for Other Services PT consult     Precautions / Restrictions Precautions Precautions: Fall;Back Precaution Booklet Issued: Yes (comment) Precaution Comments: Reviewing back precautions and pt demosntrating poor adherance to precautions during session Restrictions Weight Bearing Restrictions: No      Mobility Bed Mobility Overal bed mobility: Needs Assistance Bed Mobility: Rolling;Sidelying to Sit Rolling: Min assist Sidelying to sit: Min assist       General bed mobility comments: Min A for rolling and then elevating trunk  Transfers Overall transfer level: Needs assistance Equipment used: 1 person hand held assist Transfers: Sit to/from Stand Sit to Stand: Min assist         General transfer comment: Min A for balance    Balance Overall balance  assessment: Needs assistance Sitting-balance support: No upper extremity supported;Feet supported Sitting balance-Leahy Scale: Fair     Standing balance support: During functional activity;Single extremity supported Standing balance-Leahy Scale: Poor Standing balance comment: Reliant on UE support                           ADL either performed or assessed with clinical judgement   ADL Overall ADL's : Needs assistance/impaired Eating/Feeding: Set up;Sitting   Grooming: Min guard;Standing Grooming Details (indicate cue type and reason): Min Guardfor safety during hand hygiene Upper Body Bathing: Minimal assistance;Sitting   Lower Body Bathing: Moderate assistance;Sit to/from stand   Upper Body Dressing : Minimal assistance;Sitting   Lower Body Dressing: Moderate assistance;Sit to/from stand Lower Body Dressing Details (indicate cue type and reason): Providing education on LB dressing techniques. Pt requiring Mod A for donning socks and cues thorughout to adhere to back precautions Toilet Transfer: Minimal assistance;Ambulation;BSC Toilet Transfer Details (indicate cue type and reason): Min A for safety in sitting Toileting- Clothing Manipulation and Hygiene: Minimal assistance;Sit to/from stand Toileting - Clothing Manipulation Details (indicate cue type and reason): Min A for standing balance and cues to adhere to precautions     Functional mobility during ADLs: Minimal assistance General ADL Comments: Pt requiring increased cues for adherance to back preceautions     Vision         Perception     Praxis      Pertinent Vitals/Pain Pain Assessment: Faces Faces Pain Scale: Hurts even more Pain Location: Back and left leg Pain Descriptors / Indicators: Constant;Discomfort;Grimacing;Moaning Pain Intervention(s): Monitored during session;Limited activity within patient's  tolerance;Repositioned     Hand Dominance Right   Extremity/Trunk Assessment Upper  Extremity Assessment Upper Extremity Assessment: Generalized weakness   Lower Extremity Assessment Lower Extremity Assessment: Defer to PT evaluation   Cervical / Trunk Assessment Cervical / Trunk Assessment: Kyphotic;Other exceptions Cervical / Trunk Exceptions: S/p kyphoplasty lumbar three   Communication Communication Communication: No difficulties   Cognition Arousal/Alertness: Awake/alert Behavior During Therapy: WFL for tasks assessed/performed Overall Cognitive Status: Within Functional Limits for tasks assessed                                     General Comments  Pt would benefit from back brace due to poor adherance to precautions    Exercises     Shoulder Instructions      Home Living Family/patient expects to be discharged to:: Private residence Living Arrangements: Spouse/significant other;Children Available Help at Discharge: Family;Available 24 hours/day Type of Home: House Home Access: Stairs to enter [employer] of Steps: 3 Entrance Stairs-Rails: Left       Bathroom Shower/Tub: Teacher, early years/pre: Standard     Home Equipment: Bedside commode;Walker - 2 wheels;Cane - quad;Tub bench          Prior Functioning/Environment Level of Independence: Independent with assistive device(s)        Comments: Pt reporting she does ADLs and uses RW for functional mobility. Reports that husband and daughter performs IADLs and driving        OT Problem List: Decreased strength;Decreased range of motion;Decreased activity tolerance;Impaired balance (sitting and/or standing);Decreased safety awareness;Decreased knowledge of use of DME or AE;Decreased knowledge of precautions;Decreased cognition;Pain      OT Treatment/Interventions: Self-care/ADL training;Therapeutic exercise;Energy conservation;DME and/or AE instruction;Therapeutic activities;Patient/family education    OT Goals(Current goals can be found in the care  plan section) Acute Rehab OT Goals Patient Stated Goal: Go home OT Goal Formulation: With patient Time For Goal Achievement: 05/21/18 Potential to Achieve Goals: Good ADL Goals Pt Will Perform Lower Body Dressing: with caregiver independent in assisting;with set-up;with supervision;sit to/from stand Pt Will Transfer to Toilet: with supervision;with set-up;bedside commode;ambulating Pt Will Perform Toileting - Clothing Manipulation and hygiene: with set-up;with supervision;sit to/from stand Additional ADL Goal #1: Pt will adhere to back precautions during ADLs with 1-2 VCs  OT Frequency: Min 2X/week   Barriers to D/C:            Co-evaluation              AM-PAC PT "6 Clicks" Daily Activity     Outcome Measure Help from another person eating meals?: A Little Help from another person taking care of personal grooming?: A Little Help from another person toileting, which includes using toliet, bedpan, or urinal?: A Little Help from another person bathing (including washing, rinsing, drying)?: A Lot Help from another person to put on and taking off regular upper body clothing?: A Little Help from another person to put on and taking off regular lower body clothing?: A Lot 6 Click Score: 16   End of Session Nurse Communication: Mobility status;Precautions;Other (comment)(Poor adherance to precautions and would benefit from brace)  Activity Tolerance: Patient tolerated treatment well Patient left: (walking with PT)  OT Visit Diagnosis: Unsteadiness on feet (R26.81);Other abnormalities of gait and mobility (R26.89);Muscle weakness (generalized) (M62.81);Pain Pain - part of body: (Back)                Time:  7414-2395 OT Time Calculation (min): 13 min Charges:  OT General Charges $OT Visit: 1 Visit OT Evaluation $OT Eval Moderate Complexity: 1 Mod G-Codes:     [place] MSOT, OTR/L Acute Rehab Pager: [phone] Office: [place] 05/07/2018,  5:56 PM

## 2018-05-07 NOTE — Anesthesia Postprocedure Evaluation (Signed)
Anesthesia Post Note  Patient: Kayla Chambers  Procedure(s) Performed: KYPHOPLASTY LUMBAR THREE (N/A Back)     Patient location during evaluation: PACU Anesthesia Type: MAC Level of consciousness: awake and alert Pain management: pain level controlled Vital Signs Assessment: post-procedure vital signs reviewed and stable Respiratory status: spontaneous breathing, nonlabored ventilation and respiratory function stable Cardiovascular status: stable and blood pressure returned to baseline Postop Assessment: no apparent nausea or vomiting Anesthetic complications: no    Last Vitals:  Vitals:   05/07/18 1400 05/07/18 1437  BP: 130/69 125/68  Pulse: 76 66  Resp: 18 20  Temp: (!) 36.2 C   SpO2: 95% 98%    Last Pain:  Vitals:   05/07/18 1440  TempSrc:   PainSc: 0-No pain    LLE Motor Response: Purposeful movement (05/07/18 1440) LLE Sensation: Full sensation (05/07/18 1440) RLE Motor Response: Purposeful movement (05/07/18 1440) RLE Sensation: Full sensation (05/07/18 1440)      Lynda Rainwater

## 2018-05-07 NOTE — Progress Notes (Signed)
Report given to whitney rn as caregiver

## 2018-05-07 NOTE — Progress Notes (Signed)
05/07/18 1822  PT Visit Information  Last PT Received On 05/07/18  Assistance Needed +1  History of Present Illness 84 yo female PMH including anxiety, CAD, CHF, Dementia, Fibromyalgia, and peripheral neuropathy. S/p kyphoplasty lumbar three.  Precautions  Precautions Fall;Back  Precaution Booklet Issued Yes (comment)  Precaution Comments Unable to recall back precautions from OT session. Required review and consistent cues throughout for adeherence to precautions.   Restrictions  Weight Bearing Restrictions No  Home Living  Family/patient expects to be discharged to: Private residence  Living Arrangements Spouse/significant other;Children  Available Help at Discharge Family;Available 24 hours/day  Type of Home House  Home Access Stairs to enter  Entrance Stairs-Number of Steps 3  Entrance Stairs-Rails Left  Home Layout Able to live on main level with bedroom/bathroom  Bathroom Shower/Tub Tub/shower unit  Information systems manager - 2 wheels;Cane - quad;Tub bench  Prior Function  Level of Independence Independent with assistive device(s)  Comments Pt reporting she does ADLs and uses RW for functional mobility. Reports that husband and daughter performs IADLs and driving  Communication  Communication No difficulties  Pain Assessment  Pain Assessment Faces  Faces Pain Scale 6  Pain Location Back and left leg  Pain Descriptors / Indicators Constant;Discomfort;Grimacing;Moaning  Pain Intervention(s) Limited activity within patient's tolerance;Monitored during session;Repositioned  Cognition  Arousal/Alertness Awake/alert  Behavior During Therapy WFL for tasks assessed/performed  Overall Cognitive Status History of cognitive impairments - at baseline  General Comments Dementia at baseline   Upper Extremity Assessment  Upper Extremity Assessment Defer to OT evaluation  Lower Extremity Assessment  Lower Extremity Assessment Generalized weakness   Cervical / Trunk Assessment  Cervical / Trunk Assessment Kyphotic;Other exceptions  Cervical / Trunk Exceptions S/p kyphoplasty lumbar three  Bed Mobility  Overal bed mobility Needs Assistance  Bed Mobility Rolling;Sit to Sidelying  Rolling Min assist  Sit to sidelying Min assist  General bed mobility comments Min A for LE lift assist and for rolling into supine as pt with tendency to twist.  Transfers  Overall transfer level Needs assistance  Equipment used Rolling walker (2 wheeled)  Transfers Sit to/from Stand  Sit to Stand Min assist  General transfer comment Min A for balance  Ambulation/Gait  Ambulation/Gait assistance Min guard;Min assist  Gait Distance (Feet) 125 Feet  Assistive device Rolling walker (2 wheeled)  Gait Pattern/deviations Step-through pattern;Decreased stride length;Trunk flexed  General Gait Details Slow, unsteady gait. Min to min guard A with use of RW. Verbal cues for upright posture throughout and proximity to device.   Gait velocity Decreased   Balance  Overall balance assessment Needs assistance  Sitting-balance support No upper extremity supported;Feet supported  Sitting balance-Leahy Scale Fair  Standing balance support During functional activity;Single extremity supported  Standing balance-Leahy Scale Poor  Standing balance comment Reliant on UE support  General Comments  General comments (skin integrity, edema, etc.) Pt would benefit from back brace due to poor adherance to precautions  PT - End of Session  Activity Tolerance Patient limited by pain  Patient left in bed;with call bell/phone within reach  Nurse Communication Mobility status  PT Assessment  PT Recommendation/Assessment Patient needs continued PT services  PT Visit Diagnosis Unsteadiness on feet (R26.81);Other abnormalities of gait and mobility (R26.89);Pain  Pain - part of body  (back)  PT Problem List Decreased strength;Decreased activity tolerance;Decreased balance;Decreased  mobility;Decreased cognition;Decreased knowledge of use of DME;Decreased safety awareness;Decreased knowledge of precautions;Pain  PT Plan  PT Frequency (ACUTE  ONLY) Min 5X/week  PT Treatment/Interventions (ACUTE ONLY) Gait training;DME instruction;Stair training;Functional mobility training;Therapeutic activities;Therapeutic exercise;Balance training;Patient/family education;Cognitive remediation  AM-PAC PT "6 Clicks" Daily Activity Outcome Measure  Difficulty turning over in bed (including adjusting bedclothes, sheets and blankets)? 1  Difficulty moving from lying on back to sitting on the side of the bed?  1  Difficulty sitting down on and standing up from a chair with arms (e.g., wheelchair, bedside commode, etc,.)? 1  Help needed moving to and from a bed to chair (including a wheelchair)? 3  Help needed walking in hospital room? 3  Help needed climbing 3-5 steps with a railing?  2  6 Click Score 11  Mobility G Code  CL  PT Recommendation  Follow Up Recommendations Home health PT;Supervision for mobility/OOB  PT equipment None recommended by PT  Individuals Consulted  Consulted and Agree with Results and Recommendations Patient  Acute Rehab PT Goals  Patient Stated Goal Go home  PT Goal Formulation With patient  Time For Goal Achievement 05/21/18  Potential to Achieve Goals Good  PT Time Calculation  PT Start Time (ACUTE ONLY) 1834  PT Stop Time (ACUTE ONLY) 1849  PT Time Calculation (min) (ACUTE ONLY) 15 min  PT General Charges  $$ ACUTE PT VISIT 1 Visit  PT Evaluation  $PT Eval Moderate Complexity 1 Mod  Written Expression  Dominant Hand Right    Patient is s/p above surgery resulting in the deficits listed below (see PT Problem List). Pt unsteady requiring min to min guard A with use of RW. Educated about back precautions as pt unable to remember from OT session. Pt with poor adherence to precautions throughout session and required continuous cues. Would benefit from back  brace to increase adherence to precautions.  Patient will benefit from skilled PT to increase their independence and safety with mobility (while adhering to their precautions) to allow discharge to the venue listed below.  Leighton Ruff, PT, DPT  Acute Rehabilitation Services  Pager: [phone]

## 2018-05-07 NOTE — Anesthesia Procedure Notes (Signed)
Procedure Name: MAC Date/Time: 05/07/2018 12:23 PM Performed by: White, Amedeo Plenty, CRNA Pre-anesthesia Checklist: Patient identified, Emergency Drugs available, Suction available and Patient being monitored Patient Re-evaluated:Patient Re-evaluated prior to induction Oxygen Delivery Method: Simple face mask

## 2018-05-07 NOTE — Discharge Instructions (Signed)
Balloon Kyphoplasty °Balloon kyphoplasty is a procedure to treat a spinal compression fracture, which is a collapse of the bones that form the spine (vertebrae). With this type of fracture, the vertebrae become squashed (compressed) into a wedge shape, and this causes pain. In this procedure, the collapsed vertebrae are expanded with a balloon, and bone cement is injected into them to strengthen them. °Tell a health care provider about: °· Any allergies you have. °· All medicines you are taking, including vitamins, herbs, eye drops, creams, and over-the-counter medicines. °· Any problems you or family members have had with anesthetic medicines. °· Any blood disorders you have. °· Any surgeries you have had. °· Any medical conditions you have. °· Whether you are pregnant or may be pregnant. °What are the risks? °Generally, this is a safe procedure. However, problems may occur, including: °· Infection. °· Bleeding. °· Allergic reactions to medicines. °· Damage to other structures or organs. °· Leaking of bone cement into other parts of the body. ° °What happens before the procedure? °· Follow instructions from your health care provider about eating or drinking restrictions. °· Ask your health care provider about: °? Changing or stopping your regular medicines. This is especially important if you are taking diabetes medicines or blood thinners. °? Taking medicines such as aspirin and ibuprofen. These medicines can thin your blood. Do not take these medicines before your procedure if your health care provider instructs you not to. °· Ask your health care provider how your surgical site will be marked or identified. °· You may be given antibiotic medicine to help prevent infection. °· Do not use tobacco products, including cigarettes, chewing tobacco, or e-cigarettes. If you need help quitting, ask your health care provider. °· Plan to have someone take you home after the procedure. °· If you go home right after the  procedure, plan to have someone with you for 24 hours. °What happens during the procedure? °· To reduce your risk of infection: °? Your health care team will wash or sanitize their hands. °? Your skin will be washed with soap. °· An IV tube will be inserted into one of your veins. °· You will be given one or more of the following: °? A medicine to help you relax (sedative). °? A medicine to numb the area (local anesthetic). °? A medicine to make you fall asleep (general anesthetic). °· Your surgeon will use an X-ray machine to see your spinal compression fracture. °· Two small incisions will be made near your spine. °· A thin tube will be inserted into your spine. Through this tube, the balloon will be placed in your spine where the fractures are. °· The balloon will be inflated. This will create space and push the bone back toward its normal height and shape. °· The balloon will be removed. °· The newly created space in your spine will be filled with bone cement. °· When the cement hardens, the tube in your spine will be removed. °· Your incisions will be closed with stitches (sutures), skin glue, or adhesive strips. °· A bandage (dressing) may be used to cover your incisions. °The procedure may vary among health care providers and hospitals. °What happens after the procedure? °· Your blood pressure, heart rate, breathing rate, and blood oxygen level will be monitored often until the medicines you were given have worn off. °· You will have some pain. Pain medicine will be available to help you. °This information is not intended to replace advice given to you   by your health care provider. Make sure you discuss any questions you have with your health care provider. °Document Released: 10/11/2004 Document Revised: 04/12/2016 Document Reviewed: 02/28/2015 °[employer]vier Interactive Patient Education © 2018 [employer] ° °

## 2018-05-08 ENCOUNTER — Encounter (HOSPITAL_COMMUNITY): Payer: Self-pay | Admitting: Orthopedic Surgery

## 2018-05-08 DIAGNOSIS — I5032 Chronic diastolic (congestive) heart failure: Secondary | ICD-10-CM | POA: Diagnosis not present

## 2018-05-08 DIAGNOSIS — M8088XA Other osteoporosis with current pathological fracture, vertebra(e), initial encounter for fracture: Secondary | ICD-10-CM | POA: Diagnosis not present

## 2018-05-08 DIAGNOSIS — F419 Anxiety disorder, unspecified: Secondary | ICD-10-CM | POA: Diagnosis not present

## 2018-05-08 DIAGNOSIS — M797 Fibromyalgia: Secondary | ICD-10-CM | POA: Diagnosis not present

## 2018-05-08 DIAGNOSIS — I2510 Atherosclerotic heart disease of native coronary artery without angina pectoris: Secondary | ICD-10-CM | POA: Diagnosis not present

## 2018-05-08 DIAGNOSIS — Z951 Presence of aortocoronary bypass graft: Secondary | ICD-10-CM | POA: Diagnosis not present

## 2018-05-08 NOTE — Progress Notes (Signed)
Pt doing well. Pt and family given D/C instructions with Rx's, verbal understanding was provided. Pt's incision is clean and dry with no sign of infection. Pt's IV was removed prior to D/C. Home Health was arranged for Pt by CM. Pt D/C'd home via wheelchair @ 1120 per MD order. Pt is stable @ D/C and has no other needs at this time. Holli Humbles, RN

## 2018-05-08 NOTE — Progress Notes (Signed)
Occupational Therapy Treatment and Discharge Patient Details Name: Kayla Chambers MRN: [id number] DOB: January 22, 1927 Today's Date: 05/08/2018    History of present illness 84 yo female PMH including anxiety, CAD, CHF, Dementia, Fibromyalgia, and peripheral neuropathy. S/p kyphoplasty lumbar three.   OT comments  This 84 yo female seen today to go over back precautions and ADLs. Presents to acute OT with getting a back brace today--education completed with pt and asked RN to do education with husband since he was not present when I saw the patient. Pt able to tell me her back precautions. She still needs A with some her  LB ADLs and will continue to benefit from follow up [place]. Pt to D/C today so we will D/C her from acute OT.  Follow Up Recommendations  Home health OT;Supervision/Assistance - 24 hour       Recommendations for Other Services PT consult    Precautions / Restrictions Precautions Precautions: Fall;Back Precaution Comments: Pt was able to recall 3/3 precautions without cues. Reviewed 3/3 precautions throughout session.  Restrictions Weight Bearing Restrictions: No       Mobility Bed Mobility               General bed mobility comments: Pt sitting up in recliner upon OT arrival  Transfers Overall transfer level: Needs assistance Equipment used: Rolling walker (2 wheeled) Transfers: Sit to/from Stand Sit to Stand: Min guard                  ADL either performed or assessed with clinical judgement   ADL Overall ADL's : Needs assistance/impaired                 Upper Body Dressing : Minimal assistance;Sitting Upper Body Dressing Details (indicate cue type and reason): A to hook bra (reports help from her husband for this) Lower Body Dressing: Moderate assistance Lower Body Dressing Details (indicate cue type and reason): min guard A sit<>stand, could don pants and one sock but needed A for other sock and shoes (reports her husband will help her  with this)               General ADL Comments: pt received a back brace, Pt Mod A to don and doff it (spoke to RN and asked that they go over it with husband when he arrives)     Vision Patient Visual Report: No change from baseline            Cognition Arousal/Alertness: Awake/alert Behavior During Therapy: WFL for tasks assessed/performed Overall Cognitive Status: History of cognitive impairments - at baseline                                 General Comments: Dementia at baseline                    Pertinent Vitals/ Pain       Pain Assessment: Faces Faces Pain Scale: Hurts little more Pain Location: Back and left leg Pain Descriptors / Indicators: Discomfort;Grimacing;Sore Pain Intervention(s): Monitored during session  Home Living Family/patient expects to be discharged to:: Private residence Living Arrangements: Spouse/significant other;Children Available Help at Discharge: Family;Available 24 hours/day Type of Home: House Home Access: Stairs to enter [employer] of Steps: 3 Entrance Stairs-Rails: Left Home Layout: Able to live on main level with bedroom/bathroom     Bathroom Shower/Tub: Teacher, early years/pre: Standard  Home Equipment: Bedside commode;Walker - 2 wheels;Cane - quad;Tub bench   Additional Comments: bedroom is upstairs but pt can have a first floor set up.      Prior Functioning/Environment Level of Independence: Independent with assistive device(s)        Comments: Pt reporting she does ADLs and uses RW for functional mobility. Reports that husband and daughter performs IADLs and driving   Frequency  Min 2X/week        Progress Toward Goals  OT Goals(current goals can now be found in the care plan section)  Progress towards OT goals: (All education met in this venue)     Plan Discharge plan remains appropriate       AM-PAC PT "6 Clicks" Daily Activity     Outcome Measure    Help from another person eating meals?: None Help from another person taking care of personal grooming?: A Little Help from another person toileting, which includes using toliet, bedpan, or urinal?: A Little Help from another person bathing (including washing, rinsing, drying)?: A Little Help from another person to put on and taking off regular upper body clothing?: A Little Help from another person to put on and taking off regular lower body clothing?: A Lot 6 Click Score: 18    End of Session Equipment Utilized During Treatment: Rolling walker  OT Visit Diagnosis: Unsteadiness on feet (R26.81);Other abnormalities of gait and mobility (R26.89);Muscle weakness (generalized) (M62.81);Pain Pain - part of body: (back and left leg)   Activity Tolerance Patient tolerated treatment well   Patient Left in chair;with call bell/phone within reach   Nurse Communication (need for [facility])        Time: 9507-2257 OT Time Calculation (min): 20 min  Charges: OT General Charges $OT Visit: 1 Visit OT Treatments $Self Care/Home Management : 8-22 mins  [place], OTR/L [phone] 05/08/2018

## 2018-05-08 NOTE — Progress Notes (Signed)
    Subjective: 1 Day Post-Op Procedure(s) (LRB): KYPHOPLASTY LUMBAR THREE (N/A) Patient reports pain as 3 on 0-10 scale.   Denies CP or SOB.  Voiding without difficulty. Positive flatus. Objective: Vital signs in last 24 hours: Temp:  [97.2 F (36.2 C)-99.3 F (37.4 C)] 99.3 F (37.4 C) (06/20 0317) Pulse Rate:  [64-77] 77 (06/20 0317) Resp:  [18-22] 18 (06/20 0317) BP: (101-153)/(62-87) 101/81 (06/20 0317) SpO2:  [94 %-98 %] 94 % (06/20 0317) Weight:  [57.8 kg (127 lb 6.8 oz)] 57.8 kg (127 lb 6.8 oz) (06/19 1107)  Intake/Output from previous day: 06/19 0701 - 06/20 0700 In: 1180.2 [P.O.:120; I.V.:837.2; IV Piggyback:223.1] Out: 10 [Blood:10] Intake/Output this shift: No intake/output data recorded.  Labs: No results for input(s): HGB in the last 72 hours. No results for input(s): WBC, RBC, HCT, PLT in the last 72 hours. No results for input(s): NA, K, CL, CO2, BUN, CREATININE, GLUCOSE, CALCIUM in the last 72 hours. Recent Labs    05/07/18 1107  INR 0.99    Physical Exam: Neurologically intact ABD soft Sensation intact distally Dorsiflexion/Plantar flexion intact Incision: no drainage Compartment soft Body mass index is 21.2 kg/m.   Assessment/Plan: 1 Day Post-Op Procedure(s) (LRB): KYPHOPLASTY LUMBAR THREE (N/A) Advance diet Up with therapy  Pt may DC home after cleared from PT Ordered LSP on recommendation of PT  [place], Darla Lesches for Dr. Melina Schools [facility] [phone] 05/08/2018, 7:30 AM

## 2018-05-08 NOTE — Progress Notes (Signed)
Physical Therapy Treatment Patient Details Name: Kayla Chambers MRN: [id number] DOB: 17-Oct-1927 Today's Date: 05/08/2018    History of Present Illness 84 yo female PMH including anxiety, CAD, CHF, Dementia, Fibromyalgia, and peripheral neuropathy. S/p kyphoplasty lumbar three.    PT Comments    Pt progressing with post-op mobility. Required increased cues for maintenance of precautions and safety throughout session. Overall pt very flexed with RW and had difficulty achieving improved posture. Continue to feel that HHPT would be beneficial to maximize functional independence and safety within her home environment.    Follow Up Recommendations  Home health PT;Supervision for mobility/OOB     Equipment Recommendations  None recommended by PT    Recommendations for Other Services       Precautions / Restrictions Precautions Precautions: Fall;Back Precaution Booklet Issued: Yes (comment) Precaution Comments: Pt was able to recall 2/3 precautions without cues. Reviewed 3/3 precautions throughout session.  Restrictions Weight Bearing Restrictions: No    Mobility  Bed Mobility               General bed mobility comments: Pt sitting up on EOB when PT arrived.   Transfers Overall transfer level: Needs assistance Equipment used: Rolling walker (2 wheeled) Transfers: Sit to/from Stand Sit to Stand: Min guard         General transfer comment: Hands-on guarding for balance as pt powered up to full standing position. VC's for hand placement on seated surface for safety and having the walker right in front of her prior to initiating stand.   Ambulation/Gait Ambulation/Gait assistance: Min guard;Min assist Gait Distance (Feet): 150 Feet Assistive device: Rolling walker (2 wheeled) Gait Pattern/deviations: Step-through pattern;Decreased stride length;Trunk flexed Gait velocity: Decreased  Gait velocity interpretation: <1.31 ft/sec, indicative of household ambulator General  Gait Details: Slow, unsteady gait. Min to min guard A with use of RW. Verbal cues for upright posture throughout and proximity to device.    Stairs Stairs: Yes Stairs assistance: Min assist Stair Management: Two rails;Step to pattern;Forwards Number of Stairs: 4 General stair comments: VC's for sequencing and general safety.   Wheelchair Mobility    Modified Rankin (Stroke Patients Only)       Balance Overall balance assessment: Needs assistance Sitting-balance support: No upper extremity supported;Feet supported Sitting balance-Leahy Scale: Fair     Standing balance support: During functional activity;Single extremity supported Standing balance-Leahy Scale: Poor Standing balance comment: Reliant on UE support                            Cognition Arousal/Alertness: Awake/alert Behavior During Therapy: WFL for tasks assessed/performed Overall Cognitive Status: History of cognitive impairments - at baseline                                 General Comments: Dementia at baseline       Exercises      General Comments General comments (skin integrity, edema, etc.): Pt would benefit from back brace due to poor adherance to precautions      Pertinent Vitals/Pain Pain Assessment: Faces Faces Pain Scale: Hurts even more Pain Location: Back and left leg Pain Descriptors / Indicators: Constant;Discomfort;Grimacing;Moaning Pain Intervention(s): Monitored during session    Home Living                      Prior Function  PT Goals (current goals can now be found in the care plan section) Acute Rehab PT Goals Patient Stated Goal: Go home PT Goal Formulation: With patient Time For Goal Achievement: 05/21/18 Potential to Achieve Goals: Good Progress towards PT goals: Progressing toward goals    Frequency    Min 5X/week      PT Plan Current plan remains appropriate    Co-evaluation              AM-PAC PT "6  Clicks" Daily Activity  Outcome Measure  Difficulty turning over in bed (including adjusting bedclothes, sheets and blankets)?: Unable Difficulty moving from lying on back to sitting on the side of the bed? : Unable Difficulty sitting down on and standing up from a chair with arms (e.g., wheelchair, bedside commode, etc,.)?: Unable Help needed moving to and from a bed to chair (including a wheelchair)?: A Little Help needed walking in hospital room?: A Little Help needed climbing 3-5 steps with a railing? : A Little 6 Click Score: 12    End of Session Equipment Utilized During Treatment: Gait belt Activity Tolerance: Patient limited by pain Patient left: in chair;with call bell/phone within reach Nurse Communication: Mobility status PT Visit Diagnosis: Unsteadiness on feet (R26.81);Other abnormalities of gait and mobility (R26.89);Pain Pain - part of body: (back)     Time: 4270-6237 PT Time Calculation (min) (ACUTE ONLY): 22 min  Charges:  $Gait Training: 8-22 mins                    G Codes:       Rolinda Roan, PT, DPT Acute Rehabilitation Services Pager: [phone]    Thelma Comp 05/08/2018, 11:02 AM

## 2018-05-08 NOTE — Discharge Summary (Signed)
Physician Discharge Summary  Patient ID: Kayla Chambers MRN: [id number] DOB/AGE: 03/04/1927 84 y.o.  Admit date: 05/07/2018 Discharge date: 05/08/2018  Admission Diagnoses:  L3 fracture  Discharge Diagnoses:  Active Problems:   S/P kyphoplasty   Past Medical History:  Diagnosis Date  . Anxiety    takes xanax for sleep.   . Aortic insufficiency    a. 03/2013 s/p  AVR w/ biologic 23mm Magna Ease peric tissue valve, model 300TFX, ser# [id number].   . Back pain    BACK BRACE/FRACTURE  . CAD (coronary artery disease)    a. 01/2013 mod calcification w/o sev dzs;  b. 03/2013 CABG x1 VG->RCA @ time of AVR  . Chronic diastolic CHF (congestive heart failure) ([place])    a. 01/2013 echo: EF 50-55%.  . Dementia   . Dilated aortic root ([place])    a. 03/2013 s/p Bentall procedure with Ao Root replacement (53mm Valsalva graft) w/ reimplantationof the R and L coronary ostium.  Marland Kitchen Dysrhythmia    A. FIB  . Fibromyalgia   . Headache(784.0)    hx migraines  . Hypothyroidism   . Intracranial aneurysm    in late 1970's  . 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 Neoadjuvant RT by Dr. Yisroel Ramming. Surveillance plan: MRI 12/08/11 shows changes in presumed post-op seroma/hematoma with slight increase in size - follow up MRI 11/18/12 showed same size. Will continue with q3 month MRI. CT C/A/P annually alternating with CXR at 6 months.   . Osteoporosis   . Paroxysmal A-fib ([place])    a. Dx 01/2013 - placed on xarelto;  b. 03/2013 s/p left sided MAZE and LA clip @ time of AVR/CABG.  . Peripheral neuropathy   . PMR (polymyalgia rheumatica) ([facility])    Inactive  . Rheumatic heart disease   . [facility])    radiation & left lower extremity s/p resection in Feb 2012  . Vertigo     Surgeries: Procedure(s): KYPHOPLASTY LUMBAR THREE on 05/07/2018   Consultants (if any):   Discharged Condition: Improved  Hospital Course:  CHARIZMA GARDINER is an 84 y.o. female who was admitted 05/07/2018 with a diagnosis of L3 fracture and went to the operating room on 05/07/2018 and underwent the above named procedures.  Pt reports low level of pain controlled on oral medications.  Pt is ambulating in hallway.  Home health is recommended for pt by PT.   She was given perioperative antibiotics:  Anti-infectives (From admission, onward)   Start     Dose/Rate Route Frequency Ordered Stop   05/07/18 2030  ceFAZolin (ANCEF) IVPB 2g/100 mL premix     2 g 200 mL/hr over 30 Minutes Intravenous Every 8 hours 05/07/18 1428 05/08/18 0413   05/07/18 1200  ceFAZolin (ANCEF) IVPB 2g/100 mL premix     2 g 200 mL/hr over 30 Minutes Intravenous 30 min pre-op 05/07/18 1049 05/07/18 1230   05/07/18 1054  ceFAZolin (ANCEF) 2-4 GM/100ML-% IVPB    Note to Pharmacy:  Laurita Quint   : cabinet override      05/07/18 1054 05/07/18 1230    .  She was given sequential compression devices, early ambulation, and TED for DVT prophylaxis.  She benefited maximally from the hospital stay and there were no complications.    Recent vital signs:  Vitals:   05/08/18 0317 05/08/18 0731  BP: 101/81 130/71  Pulse: 77 73  Resp: 18 16  Temp: 99.3 F (37.4 C) 98.4 F (36.9 C)  SpO2: 94% 96%    Recent laboratory studies:  Lab Results  Component Value Date   HGB 13.8 05/02/2018   HGB 13.6 04/10/2018   HGB 13.7 04/09/2018   Lab Results  Component Value Date   WBC 7.9 05/02/2018   PLT 202 05/02/2018   Lab Results  Component Value Date   INR 0.99 05/07/2018   Lab Results  Component Value Date   NA 137 05/02/2018   K 4.6 05/02/2018   CL 101 05/02/2018   CO2 26 05/02/2018   BUN 21 (H) 05/02/2018   CREATININE 0.90 05/02/2018   GLUCOSE 105 (H) 05/02/2018    Discharge Medications:   Allergies as of 05/08/2018      Reactions   Alendronate Sodium Nausea And Vomiting   Ambien [zolpidem Tartrate] Other (See Comments)   hallucinations   Lac  Bovis Nausea Only   Drinks soy milk   Lyrica [pregabalin] Nausea And Vomiting   Milk-related Compounds Diarrhea   Pt can have foods that have milk in it, she just can't drink milk   Neurontin [gabapentin] Nausea And Vomiting   Procaine Hcl Other (See Comments)   Reaction unknown   Toprol Xl [metoprolol Succinate] Other (See Comments)   Does not remember. Tolerates Lopressor 04/10/13, [name]      Medication List    STOP taking these medications   acetaminophen 500 MG tablet Commonly known as:  TYLENOL   ALPRAZolam 0.5 MG tablet Commonly known as:  XANAX   naproxen sodium 220 MG tablet Commonly known as:  ALEVE   oxyCODONE 5 MG immediate release tablet Commonly known as:  Oxy IR/ROXICODONE     TAKE these medications   amiodarone 200 MG tablet Commonly known as:  PACERONE Take 1 tablet (200 mg total) by mouth daily.   apixaban 2.5 MG Tabs tablet Commonly known as:  ELIQUIS Take 1 tablet (2.5 mg total) by mouth 2 (two) times daily. Start taking on:  05/09/2018   CALCIUM + D PO Take 1 tablet by mouth daily. CALCIBOOST   CRANBERRY PO Take 1 tablet by mouth 2 (two) times daily as needed (FOR BLADDER/URINARY ISSUES.).   FISH OIL PO Take 1 tablet by mouth daily.   furosemide 40 MG tablet Commonly known as:  LASIX Take 40 mg by mouth daily.   HYDROcodone-acetaminophen 5-325 MG tablet Commonly known as:  NORCO Take 1 tablet by mouth every 6 (six) hours as needed for up to 5 days for moderate pain.   lactase 3000 units tablet Commonly known as:  LACTAID Take 1 tablet by mouth daily as needed (only when eating dairy).   levothyroxine 75 MCG tablet Commonly known as:  SYNTHROID, LEVOTHROID Take 75 mcg by mouth at bedtime.   LOPERAMIDE A-D PO Take 1 tablet by mouth 4 (four) times daily as needed (diarrhea/loose stools.).   multivitamin with minerals Tabs tablet Take 1 tablet by mouth daily.   ondansetron 4 MG disintegrating tablet Commonly known as:  ZOFRAN  ODT Take 1 tablet (4 mg total) by mouth every 8 (eight) hours as needed for nausea or vomiting.   ondansetron 4 MG tablet Commonly known as:  ZOFRAN Take 1 tablet (4 mg total) by mouth every 6 (six) hours as needed for nausea.   polyethylene glycol packet Commonly known as:  MIRALAX / GLYCOLAX Take 17 g by mouth daily.   potassium chloride SA 20 MEQ tablet Commonly known as:  K-DUR,KLOR-CON Take 1  tablet (20 mEq total) by mouth daily.   pravastatin 40 MG tablet Commonly known as:  PRAVACHOL TAKE 1 TABLET BY MOUTH EVERY EVENING   PROBIOTIC PO Take 1 capsule by mouth daily as needed (FOR DIGESTIVE HEALTH.).   rOPINIRole 0.25 MG tablet Commonly known as:  REQUIP Take 0.25 mg by mouth at bedtime as needed (for restless leg syndrome.).   VITAMIN C PO Take 1 tablet by mouth daily as needed (FOR IMMUNE SYSTEM BOOST).   Vitamin D3 2000 units capsule Take 2,000 Units by mouth daily.       Diagnostic Studies: Dg Lumbar Spine 2-3 Views  Result Date: 05/07/2018 CLINICAL DATA:  Status post L3 kyphoplasty. EXAM: DG C-ARM 61-120 MIN; LUMBAR SPINE - 2-3 VIEW FLUOROSCOPY TIME:  1 minutes 53 seconds. COMPARISON:  MRI of Apr 09, 2018. FINDINGS: Two intraoperative fluoroscopic images demonstrate the patient be status post kyphoplasty of L3 vertebral body. IMPRESSION: Status post L3 kyphoplasty. Electronically Signed   By: Marijo Conception, M.D.   On: 05/07/2018 13:28   Dg Lumbar Spine Complete  Result Date: 04/09/2018 CLINICAL DATA:  Status post fall EXAM: LUMBAR SPINE - COMPLETE 4+ VIEW COMPARISON:  None. FINDINGS: Chronic L4 vertebral body compression fracture with approximately 50% height loss. L3 vertebral body compression fracture with mild progressive height loss compared with 02/09/2016 concerning for acute on subacute compression fracture. Degenerative disc disease with disc height loss at L1-2 and L2-3. Minimal retrolisthesis of L2 on L3. Severe bilateral facet arthropathy at L4-5 and  L5-S1. IMPRESSION: L3 vertebral body compression fracture with mild progressive height loss compared with 02/09/2016 concerning for acute on subacute compression fracture. Electronically Signed   By: Kathreen Devoid   On: 04/09/2018 09:30   Mr Lumbar Spine Wo Contrast  Result Date: 04/09/2018 CLINICAL DATA:  Initial evaluation for acute trauma, fall. Evaluate for possible fracture. EXAM: MRI LUMBAR SPINE WITHOUT CONTRAST TECHNIQUE: Multiplanar, multisequence MR imaging of the lumbar spine was performed. No intravenous contrast was administered. COMPARISON:  Prior CT and radiograph from earlier same day. FINDINGS: Segmentation: Normal segmentation. Lowest well-formed disc labeled the L5-S1 level. Alignment: Mild dextroscoliosis. 6 mm retrolisthesis of L2 on L3, with trace 2-3 mm retrolisthesis of L3 on L4 and L4 on L5. Vertebrae: Linear T1 hypointense, T2/STIR hyperintense signal intensity extending through the mid/upper aspect of the L3 vertebral body, consistent with acute compression fracture. Mild central height loss of 30% without significant bony retropulsion. This is benign/mechanical in appearance with no underlying pathologic lesion. Compression deformity involving the L4 vertebral body age is chronic in appearance, relatively similar as compared to previous CT from 01/02/2016. Mild edema within the L4 vertebral body felt to be related to prominent inferior endplate Schmorl's node. Vertebral body heights otherwise maintained without evidence for acute or chronic fracture. Trace edema within the S3 segment felt to be either degenerative in nature or possibly related to atypical hemangioma. Underlying bone marrow signal intensity diffusely heterogeneous without worrisome osseous mass. Conus medullaris and cauda equina: Conus extends to the T12-L1 level. Conus and cauda equina appear normal. Paraspinal and other soft tissues: Paraspinous soft tissues demonstrate no acute abnormality. Chronic atrophy noted  within the lower paraspinous musculature. Subcentimeter T2 hyperintense cyst noted within the posterior left kidney. Visualized visceral structures otherwise unremarkable. Disc levels: L1-2: Mild diffuse disc bulge. Superimposed tiny left paracentral disc extrusion with superior migration. No significant canal or foraminal stenosis. L2-3: 6 mm retrolisthesis. Mild diffuse disc bulge. Moderate facet and ligament flavum hypertrophy. Resultant moderate  left with mild right lateral recess narrowing without significant canal stenosis. Foramina remain patent. L3-4: Trace retrolisthesis. Diffuse disc bulge. Moderate facet and ligamentum flavum hypertrophy, slightly worse on the left. Resultant mild left lateral recess narrowing without significant canal stenosis. Mild left L3 foraminal stenosis. L4-5: 5 mm bony retropulsion related to the chronic L4 compression fracture. Mild diffuse disc bulge. Reactive endplate changes with endplate osteophytic spurring. Moderate to advanced facet and ligament flavum hypertrophy. Resultant moderate canal with bilateral subarticular stenosis. Moderate left with mild right L4 foraminal stenosis. L5-S1: Negative interspace. Mild to moderate facet hypertrophy, slightly worse on the left. No stenosis. IMPRESSION: 1. Acute compression fracture involving the upper/central L3 vertebral body with mild central height loss of up to 30% without bony retropulsion. 2. Chronic compression deformity of L4 with associated 5 mm bony retropulsion. 3. Multifactorial degenerative changes at L4-5 with resultant moderate canal and bilateral subarticular stenosis, with moderate left L4 foraminal narrowing. Electronically Signed   By: Jeannine Boga M.D.   On: 04/09/2018 19:56   Ct Abdomen Pelvis W Contrast  Result Date: 04/09/2018 CLINICAL DATA:  Lower back pain after fall yesterday. EXAM: CT ABDOMEN AND PELVIS WITH CONTRAST TECHNIQUE: Multidetector CT imaging of the abdomen and pelvis was performed  using the standard protocol following bolus administration of intravenous contrast. CONTRAST:  126mL ISOVUE-300 IOPAMIDOL (ISOVUE-300) INJECTION 61% COMPARISON:  None. FINDINGS: Lower chest: No acute abnormality. Hepatobiliary: No gallstones are noted. Mildly nodular hepatic contours are noted suggesting hepatic cirrhosis. 1 cm enhancing abnormality is noted in left hepatic lobe. No biliary dilatation is noted. Pancreas: Unremarkable. No pancreatic ductal dilatation or surrounding inflammatory changes. Spleen: Normal in size without focal abnormality. Adrenals/Urinary Tract: Adrenal glands are unremarkable. Kidneys are normal, without renal calculi, focal lesion, or hydronephrosis. Bladder is unremarkable. Stomach/Bowel: Stomach is within normal limits. Appendix appears normal. No evidence of bowel wall thickening, distention, or inflammatory changes. Sigmoid diverticulosis is noted without inflammation. Vascular/Lymphatic: Aortic atherosclerosis. No enlarged abdominal or pelvic lymph nodes. Reproductive: Status post hysterectomy. No adnexal masses. Other: No abdominal wall hernia or abnormality. No abdominopelvic ascites. Musculoskeletal: No acute or significant osseous findings. IMPRESSION: Mildly nodular hepatic contours are noted concerning for cirrhosis. 1 cm enhancing abnormality is noted in left hepatic lobe; this is not diagnostic for hemangioma on the basis of this exam, and further evaluation with MRI of the liver with and without gadolinium is recommended to rule out other pathology. Sigmoid diverticulosis without inflammation. No evidence of traumatic injury seen in the abdomen or pelvis. Aortic Atherosclerosis (ICD10-I70.0). Electronically Signed   By: Marijo Conception, M.D.   On: 04/09/2018 11:56   Dg C-arm 1-60 Min  Result Date: 05/07/2018 CLINICAL DATA:  Status post L3 kyphoplasty. EXAM: DG C-ARM 61-120 MIN; LUMBAR SPINE - 2-3 VIEW FLUOROSCOPY TIME:  1 minutes 53 seconds. COMPARISON:  MRI of Apr 09, 2018. FINDINGS: Two intraoperative fluoroscopic images demonstrate the patient be status post kyphoplasty of L3 vertebral body. IMPRESSION: Status post L3 kyphoplasty. Electronically Signed   By: Marijo Conception, M.D.   On: 05/07/2018 13:28    Disposition:  Home health PT ordered for pt Post op meds provided  Pt will present to clinic in 2 weeks Discharge Instructions    Incentive spirometry RT   Complete by:  As directed       Follow-up Information    Melina Schools, MD Follow up in 2 week(s).   Specialty:  Orthopedic Surgery Contact information: [place]  [place] [address] [phone]            Signed: Valinda Hoar 05/08/2018, 2:38 PM

## 2018-05-09 DIAGNOSIS — M8008XA Age-related osteoporosis with current pathological fracture, vertebra(e), initial encounter for fracture: Secondary | ICD-10-CM | POA: Diagnosis not present

## 2018-05-09 DIAGNOSIS — I0981 Rheumatic heart failure: Secondary | ICD-10-CM | POA: Diagnosis not present

## 2018-05-12 ENCOUNTER — Telehealth: Payer: Self-pay | Admitting: Cardiology

## 2018-05-12 MED ORDER — APIXABAN 2.5 MG PO TABS: 2.5000 mg | ORAL_TABLET | Freq: Two times a day (BID) | ORAL | 3 refills | Status: DC

## 2018-05-12 NOTE — Telephone Encounter (Signed)
Spoke with pt husband and confirmed that based of discharge summary on 05/08/18 pt is to continue taking Eliquis 2.5 mg BID. Husband verbalized understanding and stated he need a refill. E-script sent to pharmacy.

## 2018-05-12 NOTE — Telephone Encounter (Signed)
Pt's spouse calling  Pt need to know if pt's will continue on Eliquis 2.5mg . It was changed when she was in the ED. Please advise pt's husband if she would continue with this dosage.

## 2018-05-13 DIAGNOSIS — M8008XA Age-related osteoporosis with current pathological fracture, vertebra(e), initial encounter for fracture: Secondary | ICD-10-CM | POA: Diagnosis not present

## 2018-05-13 DIAGNOSIS — I0981 Rheumatic heart failure: Secondary | ICD-10-CM | POA: Diagnosis not present

## 2018-05-16 DIAGNOSIS — I0981 Rheumatic heart failure: Secondary | ICD-10-CM | POA: Diagnosis not present

## 2018-05-16 DIAGNOSIS — M8008XA Age-related osteoporosis with current pathological fracture, vertebra(e), initial encounter for fracture: Secondary | ICD-10-CM | POA: Diagnosis not present

## 2018-05-20 DIAGNOSIS — I0981 Rheumatic heart failure: Secondary | ICD-10-CM | POA: Diagnosis not present

## 2018-05-20 DIAGNOSIS — M8008XA Age-related osteoporosis with current pathological fracture, vertebra(e), initial encounter for fracture: Secondary | ICD-10-CM | POA: Diagnosis not present

## 2018-05-23 DIAGNOSIS — M8008XA Age-related osteoporosis with current pathological fracture, vertebra(e), initial encounter for fracture: Secondary | ICD-10-CM | POA: Diagnosis not present

## 2018-05-23 DIAGNOSIS — I0981 Rheumatic heart failure: Secondary | ICD-10-CM | POA: Diagnosis not present

## 2018-05-27 DIAGNOSIS — M8008XA Age-related osteoporosis with current pathological fracture, vertebra(e), initial encounter for fracture: Secondary | ICD-10-CM | POA: Diagnosis not present

## 2018-05-27 DIAGNOSIS — I0981 Rheumatic heart failure: Secondary | ICD-10-CM | POA: Diagnosis not present

## 2018-05-29 ENCOUNTER — Telehealth: Payer: Self-pay | Admitting: Cardiology

## 2018-05-29 NOTE — Telephone Encounter (Signed)
Pt's spouse calling   Need to speak to nurse concerning pt's medication she is taking.

## 2018-05-29 NOTE — Telephone Encounter (Signed)
Called back patient's husband stated that earlier this morning he checked his wife's cardia monitor and it stated she was in possible Afib, she was asymptomatic HR was 69. Checked it again in a few minutes and it was back in normal rhythm. Advised patient to continue to monitor, if it occurred again to call us back. Patient spouse understood and had no questions or concerns at this time.

## 2018-05-30 DIAGNOSIS — M8008XA Age-related osteoporosis with current pathological fracture, vertebra(e), initial encounter for fracture: Secondary | ICD-10-CM | POA: Diagnosis not present

## 2018-05-30 DIAGNOSIS — I0981 Rheumatic heart failure: Secondary | ICD-10-CM | POA: Diagnosis not present

## 2018-06-02 DIAGNOSIS — M8008XA Age-related osteoporosis with current pathological fracture, vertebra(e), initial encounter for fracture: Secondary | ICD-10-CM | POA: Diagnosis not present

## 2018-06-02 DIAGNOSIS — I0981 Rheumatic heart failure: Secondary | ICD-10-CM | POA: Diagnosis not present

## 2018-06-06 DIAGNOSIS — I0981 Rheumatic heart failure: Secondary | ICD-10-CM | POA: Diagnosis not present

## 2018-06-06 DIAGNOSIS — M8008XA Age-related osteoporosis with current pathological fracture, vertebra(e), initial encounter for fracture: Secondary | ICD-10-CM | POA: Diagnosis not present

## 2018-06-09 ENCOUNTER — Telehealth: Payer: Self-pay | Admitting: Cardiology

## 2018-06-09 DIAGNOSIS — Z4789 Encounter for other orthopedic aftercare: Secondary | ICD-10-CM | POA: Diagnosis not present

## 2018-06-09 NOTE — Telephone Encounter (Signed)
New Message    Pt c/o medication issue:  1. Name of Medication: apixaban (ELIQUIS) 2.5 MG TABS tablet  2. How are you currently taking this medication (dosage and times per day)? Take 1 tablet (2.5 mg total) by mouth 2 (two) times daily  3. Are you having a reaction (difficulty breathing--STAT)? no  4. What is your medication issue? Pt's husband is calling states that she just had back surgery and want to know if its ok to take Aleve with the Eliquis. Please call

## 2018-06-09 NOTE — Telephone Encounter (Signed)
Discussed with Alena Bills D and ok to take Aleve 1 tablet every 12 hours for 2-3 days only. Advised husband, verbalized understanding.

## 2018-06-10 DIAGNOSIS — M8008XA Age-related osteoporosis with current pathological fracture, vertebra(e), initial encounter for fracture: Secondary | ICD-10-CM | POA: Diagnosis not present

## 2018-06-10 DIAGNOSIS — I0981 Rheumatic heart failure: Secondary | ICD-10-CM | POA: Diagnosis not present

## 2018-06-12 DIAGNOSIS — R2689 Other abnormalities of gait and mobility: Secondary | ICD-10-CM | POA: Diagnosis not present

## 2018-06-12 DIAGNOSIS — M545 Low back pain: Secondary | ICD-10-CM | POA: Diagnosis not present

## 2018-06-19 DIAGNOSIS — M545 Low back pain: Secondary | ICD-10-CM | POA: Diagnosis not present

## 2018-06-19 DIAGNOSIS — R2689 Other abnormalities of gait and mobility: Secondary | ICD-10-CM | POA: Diagnosis not present

## 2018-06-23 DIAGNOSIS — R2689 Other abnormalities of gait and mobility: Secondary | ICD-10-CM | POA: Diagnosis not present

## 2018-06-23 DIAGNOSIS — M545 Low back pain: Secondary | ICD-10-CM | POA: Diagnosis not present

## 2018-06-25 DIAGNOSIS — R2689 Other abnormalities of gait and mobility: Secondary | ICD-10-CM | POA: Diagnosis not present

## 2018-06-25 DIAGNOSIS — M545 Low back pain: Secondary | ICD-10-CM | POA: Diagnosis not present

## 2018-06-28 ENCOUNTER — Other Ambulatory Visit: Payer: Self-pay | Admitting: Cardiology

## 2018-06-30 DIAGNOSIS — M545 Low back pain: Secondary | ICD-10-CM | POA: Diagnosis not present

## 2018-06-30 DIAGNOSIS — R2689 Other abnormalities of gait and mobility: Secondary | ICD-10-CM | POA: Diagnosis not present

## 2018-07-02 DIAGNOSIS — R2689 Other abnormalities of gait and mobility: Secondary | ICD-10-CM | POA: Diagnosis not present

## 2018-07-02 DIAGNOSIS — M545 Low back pain: Secondary | ICD-10-CM | POA: Diagnosis not present

## 2018-07-07 ENCOUNTER — Telehealth: Payer: Self-pay | Admitting: Cardiology

## 2018-07-07 DIAGNOSIS — R2689 Other abnormalities of gait and mobility: Secondary | ICD-10-CM | POA: Diagnosis not present

## 2018-07-07 DIAGNOSIS — M545 Low back pain: Secondary | ICD-10-CM | POA: Diagnosis not present

## 2018-07-07 NOTE — Telephone Encounter (Signed)
Ok to refill xanax [name]

## 2018-07-07 NOTE — Telephone Encounter (Signed)
Returned call to spouse (ok per DPR)-who states Dr. Stanford Breed prescribed Berenice Primas in the past to help patient with sleep and he states this refill was denied and was unsure why (no longer on med list).   Advised typically this would come from PCP, spouse states Dr. Stanford Breed has done this in the past.    Advised would route to MD for review.

## 2018-07-07 NOTE — Telephone Encounter (Signed)
New Message         Pt c/o medication issue:  1. Name of Medication: Alprazolam .5mg   2. How are you currently taking this medication (dosage and times per day)? Once a day  3. Are you having a reaction (difficulty breathing--STAT)? No  4. What is your medication issue? Rx has being denied. Patient needs something to help her sleep.

## 2018-07-08 MED ORDER — ALPRAZOLAM 0.5 MG PO TABS: 0.5000 mg | ORAL_TABLET | Freq: Every evening | ORAL | 0 refills | Status: DC | PRN

## 2018-07-08 NOTE — Telephone Encounter (Signed)
Refill phoned to the pharmacy 

## 2018-07-09 DIAGNOSIS — R2689 Other abnormalities of gait and mobility: Secondary | ICD-10-CM | POA: Diagnosis not present

## 2018-07-09 DIAGNOSIS — Z4789 Encounter for other orthopedic aftercare: Secondary | ICD-10-CM | POA: Diagnosis not present

## 2018-07-09 DIAGNOSIS — M545 Low back pain: Secondary | ICD-10-CM | POA: Diagnosis not present

## 2018-07-14 DIAGNOSIS — R2689 Other abnormalities of gait and mobility: Secondary | ICD-10-CM | POA: Diagnosis not present

## 2018-07-14 DIAGNOSIS — M545 Low back pain: Secondary | ICD-10-CM | POA: Diagnosis not present

## 2018-07-17 DIAGNOSIS — R2689 Other abnormalities of gait and mobility: Secondary | ICD-10-CM | POA: Diagnosis not present

## 2018-07-17 DIAGNOSIS — M545 Low back pain: Secondary | ICD-10-CM | POA: Diagnosis not present

## 2018-07-25 DIAGNOSIS — M4856XD Collapsed vertebra, not elsewhere classified, lumbar region, subsequent encounter for fracture with routine healing: Secondary | ICD-10-CM | POA: Diagnosis not present

## 2018-07-25 DIAGNOSIS — M545 Low back pain: Secondary | ICD-10-CM | POA: Diagnosis not present

## 2018-08-04 NOTE — Progress Notes (Signed)
HPI: Follow-up CADstatus post coronary artery bypass graft, Bentall/AVR and maze procedure. She underwent a Bentall procedure by Dr. Servando Snare on 03/31/13. 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 Preoperatively she had been in and out of atrial fibrillation. Carotid Dopplers March 2017 showed 1-39% bilateral stenosis.Has had recurrent atrial fibrillation.Recently seen with recurrent atrial fibrillation and CHF; diuretics increased and amiodarone initiated; had repeat DCCV 10/22/17. Seen in fu and noted to be in atrial flutter. Plan was repeat DCCV following full amiodarone load.Last echocardiogram November 2018 showed normal LV function, bioprosthetic aortic valve, mild mitral regurgitation, mild left atrial enlargement,severe right atrial enlargement and severe TR.Abd CT 5/19 showed 1 cm lesion in left lobe of liver; MRI recommended. Since last seen,patient denies dyspnea, exertional chest pain or pedal edema.  No syncope or bleeding.  Current Outpatient Medications  Medication Sig Dispense Refill  . ALPRAZolam (XANAX) 0.5 MG tablet Take 1 tablet (0.5 mg total) by mouth at bedtime as needed for anxiety. 90 tablet 0  . amiodarone (PACERONE) 200 MG tablet Take 1 tablet (200 mg total) by mouth daily. 180 tablet 3  . apixaban (ELIQUIS) 2.5 MG TABS tablet Take 1 tablet (2.5 mg total) by mouth 2 (two) times daily. 180 tablet 3  . Ascorbic Acid (VITAMIN C PO) Take 1 tablet by mouth daily as needed (FOR IMMUNE SYSTEM BOOST).     . Calcium Carbonate-Vitamin D (CALCIUM + D PO) Take 1 tablet by mouth daily. CALCIBOOST    . Cholecalciferol (VITAMIN D3) 2000 units capsule Take 2,000 Units by mouth daily.    Marland Kitchen CRANBERRY PO Take 1 tablet by mouth 2 (two) times daily as needed (FOR BLADDER/URINARY ISSUES.).     Marland Kitchen furosemide (LASIX) 40 MG tablet Take 40 mg by mouth daily.    Marland Kitchen  lactase (LACTAID) 3000 UNITS tablet Take 1 tablet by mouth daily as needed (only when eating dairy).    Marland Kitchen levothyroxine (SYNTHROID, LEVOTHROID) 75 MCG tablet Take 75 mcg by mouth at bedtime.     . Loperamide HCl (LOPERAMIDE A-D PO) Take 1 tablet by mouth 4 (four) times daily as needed (diarrhea/loose stools.).     Marland Kitchen Multiple Vitamin (MULTIVITAMIN WITH MINERALS) TABS Take 1 tablet by mouth daily.    . Omega-3 Fatty Acids (FISH OIL PO) Take 1 tablet by mouth daily.    . ondansetron (ZOFRAN ODT) 4 MG disintegrating tablet Take 1 tablet (4 mg total) by mouth every 8 (eight) hours as needed for nausea or vomiting. 20 tablet 0  . ondansetron (ZOFRAN) 4 MG tablet Take 1 tablet (4 mg total) by mouth every 6 (six) hours as needed for nausea. 20 tablet 0  . polyethylene glycol (MIRALAX / GLYCOLAX) packet Take 17 g by mouth daily. 14 each 0  . potassium chloride SA (K-DUR,KLOR-CON) 20 MEQ tablet Take 1 tablet (20 mEq total) by mouth daily. 90 tablet 3  . pravastatin (PRAVACHOL) 40 MG tablet TAKE 1 TABLET BY MOUTH EVERY EVENING 90 tablet 1  . Probiotic Product (PROBIOTIC PO) Take 1 capsule by mouth daily as needed (FOR DIGESTIVE HEALTH.).    Marland Kitchen rOPINIRole (REQUIP) 0.25 MG tablet Take 0.25 mg by mouth at bedtime as needed (for restless leg syndrome.).      No current facility-administered medications for this visit.      Past Medical History:  Diagnosis Date  . Anxiety  takes xanax for sleep.   . Aortic insufficiency    a. 03/2013 s/p  AVR w/ biologic 87mm Magna Ease peric tissue valve, model 300TFX, ser# [id number].   . Back pain    BACK BRACE/FRACTURE  . CAD (coronary artery disease)    a. 01/2013 mod calcification w/o sev dzs;  b. 03/2013 CABG x1 VG->RCA @ time of AVR  . Chronic diastolic CHF (congestive heart failure) ([place])    a. 01/2013 echo: EF 50-55%.  . Dementia   . Dilated aortic root ([place])    a. 03/2013 s/p Bentall procedure with Ao Root replacement (75mm Valsalva graft) w/ reimplantationof the  R and L coronary ostium.  Marland Kitchen Dysrhythmia    A. FIB  . Fibromyalgia   . Headache(784.0)    hx migraines  . Hypothyroidism   . Intracranial aneurysm    in late 1970's  . 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 Neoadjuvant RT by Dr. Yisroel Ramming. Surveillance plan: MRI 12/08/11 shows changes in presumed post-op seroma/hematoma with slight increase in size - follow up MRI 11/18/12 showed same size. Will continue with q3 month MRI. CT C/A/P annually alternating with CXR at 6 months.   . Osteoporosis   . Paroxysmal A-fib ([place])    a. Dx 01/2013 - placed on xarelto;  b. 03/2013 s/p left sided MAZE and LA clip @ time of AVR/CABG.  . Peripheral neuropathy   . PMR (polymyalgia rheumatica) ([facility])    Inactive  . Rheumatic heart disease   . 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 Vertigo     Past Surgical History:  Procedure Laterality Date  . ABDOMINAL HYSTERECTOMY    . ASCENDING AORTIC ROOT REPLACEMENT N/A 03/31/2013   Procedure: ASCENDING AORTIC ROOT REPLACEMENT;  Surgeon: Grace Isaac, MD;  Location: [place];  Service: Open Heart Surgery;  Laterality: N/A; clip inserted.  Marland Kitchen BLADDER SUSPENSION    . CARDIOVERSION N/A 01/29/2017   Procedure: CARDIOVERSION;  Surgeon: Jerline Pain, MD;  Location: [place];  Service: Cardiovascular;  Laterality: N/A;  . CARDIOVERSION N/A 10/22/2017   Procedure: CARDIOVERSION;  Surgeon: Lelon Perla, MD;  Location: [facility] ENDOSCOPY;  Service: Cardiovascular;  Laterality: N/A;  . COLONOSCOPY  10/25/2011   Procedure: COLONOSCOPY;  Surgeon: Garlan Fair, MD;  Location: [facility] ENDOSCOPY;  Service: Endoscopy;  Laterality: N/A;  . EYE SURGERY Bilateral    cataracts  . INTRAOPERATIVE TRANSESOPHAGEAL ECHOCARDIOGRAM N/A 03/31/2013   Procedure: INTRAOPERATIVE TRANSESOPHAGEAL ECHOCARDIOGRAM;  Surgeon: Grace Isaac, MD;  Location: [place];   Service: Open Heart Surgery;  Laterality: N/A;  . KYPHOPLASTY N/A 05/07/2018   Procedure: KYPHOPLASTY LUMBAR THREE;  Surgeon: Melina Schools, MD;  Location: [place];  Service: Orthopedics;  Laterality: N/A;  . LEFT AND RIGHT HEART CATHETERIZATION WITH CORONARY ANGIOGRAM Right 02/02/2013   Procedure: LEFT AND RIGHT HEART CATHETERIZATION WITH CORONARY ANGIOGRAM;  Surgeon: Hillary Bow, MD;  Location: [facility] CATH LAB;  Service: Cardiovascular;  Laterality: Right;  Marland Kitchen MAZE N/A 03/31/2013   Procedure: MAZE;  Surgeon: Grace Isaac, MD;  Location: [place];  Service: Open Heart Surgery;  Laterality: N/A;  . Sarcoma resection  2011    Social History   Socioeconomic History  . Marital status: Married    Spouse name: Not on file  . Number of children: Not on file  . Years of education:  Not on file  . Highest education level: Not on file  Occupational History  . Not on file  Social Needs  . Financial resource strain: Not on file  . Food insecurity:    Worry: Not on file    Inability: Not on file  . Transportation needs:    Medical: Not on file    Non-medical: Not on file  Tobacco Use  . Smoking status: Never Smoker  . Smokeless tobacco: Never Used  Substance and Sexual Activity  . Alcohol use: No  . Drug use: No  . Sexual activity: Yes  Lifestyle  . Physical activity:    Days per week: Not on file    Minutes per session: Not on file  . Stress: Not on file  Relationships  . Social connections:    Talks on phone: Not on file    Gets together: Not on file    Attends religious service: Not on file    Active member of club or organization: Not on file    Attends meetings of clubs or organizations: Not on file    Relationship status: Not on file  . Intimate partner violence:    Fear of current or ex partner: Not on file    Emotionally abused: Not on file    Physically abused: Not on file    Forced sexual activity: Not on file  Other Topics Concern  . Not on file  Social History  Narrative  . Not on file    Family History  Problem Relation Age of Onset  . Heart disease Mother   . Stroke Mother   . Arthritis Father   . Cancer Father   . Anesthesia problems Brother   . Hypertension Sister     ROS: Back pain but no fevers or chills, productive cough, hemoptysis, dysphasia, odynophagia, melena, hematochezia, dysuria, hematuria, rash, seizure activity, orthopnea, PND, pedal edema, claudication. Remaining systems are negative.  Physical Exam: Well-developed frail in no acute distress.  Skin is warm and dry.  HEENT is normal.  Neck is supple.  Chest is clear to auscultation with normal expansion.  Cardiovascular exam is regular rate and rhythm.  Abdominal exam nontender or distended. No masses palpated. Extremities show no edema. neuro grossly intact   A/P  1 paroxysmal atrial fibrillation-patient is in sinus rhythm on examination today.  Continue amiodarone at present dose.  Continue apixaban.  Check TSH, liver functions and chest x-ray. Check Hgb and renal function.   2 coronary artery disease-we will continue with present dose of statin.  No aspirin given need for anticoagulation.  3 chronic diastolic congestive heart failure-patient appears to be euvolemic today.  Continue present dose of Lasix.  Check potassium and renal function.  Patient needs low-sodium diet and fluid restriction.  4 hypertension-blood pressure is controlled.  Continue present medications and follow.  5 prior aortic valve replacement-continue SBE prophylaxis.  6 hyperlipidemia-continue statin.    7 tricuspid regurgitation-conservative measures given frailty and age.  [name], MD

## 2018-08-05 DIAGNOSIS — M545 Low back pain: Secondary | ICD-10-CM | POA: Diagnosis not present

## 2018-08-11 ENCOUNTER — Ambulatory Visit (INDEPENDENT_AMBULATORY_CARE_PROVIDER_SITE_OTHER): Payer: Medicare Other | Admitting: Cardiology

## 2018-08-11 ENCOUNTER — Encounter: Payer: Self-pay | Admitting: Cardiology

## 2018-08-11 VITALS — BP 92/60 | HR 66 | Ht 60.5 in | Wt 132.0 lb

## 2018-08-11 DIAGNOSIS — I2510 Atherosclerotic heart disease of native coronary artery without angina pectoris: Secondary | ICD-10-CM

## 2018-08-11 DIAGNOSIS — I480 Paroxysmal atrial fibrillation: Secondary | ICD-10-CM

## 2018-08-11 DIAGNOSIS — I5032 Chronic diastolic (congestive) heart failure: Secondary | ICD-10-CM

## 2018-08-11 DIAGNOSIS — I10 Essential (primary) hypertension: Secondary | ICD-10-CM

## 2018-08-11 NOTE — Patient Instructions (Signed)
Medication Instructions:   NO CHANGE  Labwork:  Your physician recommends that you HAVE LAB WORK TODAY  Testing/Procedures:  A chest x-ray takes a picture of the organs and structures inside the chest, including the heart, lungs, and blood vessels. This test can show several things, including, whether the heart is enlarges; whether fluid is building up in the lungs; and whether pacemaker / defibrillator leads are still in place. AT [facility]  Follow-Up:  Your physician wants you to follow-up in: 6 MONTHS WITH DR CRENSHAW You will receive a reminder letter in the mail two months in advance. If you don't receive a letter, please call our office to schedule the follow-up appointment.   If you need a refill on your cardiac medications before your next appointment, please call your pharmacy.    

## 2018-08-12 ENCOUNTER — Encounter: Payer: Self-pay | Admitting: *Deleted

## 2018-08-12 DIAGNOSIS — M545 Low back pain: Secondary | ICD-10-CM | POA: Diagnosis not present

## 2018-08-12 LAB — CBC
Hematocrit: 37.4 % (ref 34.0–46.6)
Hemoglobin: 12.4 g/dL (ref 11.1–15.9)
MCH: 32.2 pg (ref 26.6–33.0)
MCHC: 33.2 g/dL (ref 31.5–35.7)
MCV: 97 fL (ref 79–97)
Platelets: 179 10*3/uL (ref 150–450)
RBC: 3.85 x10E6/uL (ref 3.77–5.28)
RDW: 14.8 % (ref 12.3–15.4)
WBC: 4.5 10*3/uL (ref 3.4–10.8)

## 2018-08-12 LAB — COMPREHENSIVE METABOLIC PANEL WITH GFR
ALT: 18 [IU]/L (ref 0–32)
AST: 22 [IU]/L (ref 0–40)
Albumin/Globulin Ratio: 1.9 (ref 1.2–2.2)
Albumin: 4 g/dL (ref 3.5–4.7)
Alkaline Phosphatase: 107 [IU]/L (ref 39–117)
BUN/Creatinine Ratio: 17 (ref 12–28)
BUN: 15 mg/dL (ref 8–27)
Bilirubin Total: 0.3 mg/dL (ref 0.0–1.2)
CO2: 26 mmol/L (ref 20–29)
Calcium: 9.6 mg/dL (ref 8.7–10.3)
Chloride: 99 mmol/L (ref 96–106)
Creatinine, Ser: 0.88 mg/dL (ref 0.57–1.00)
GFR calc Af Amer: 68 mL/min/{1.73_m2}
GFR calc non Af Amer: 59 mL/min/{1.73_m2} — ABNORMAL LOW
Globulin, Total: 2.1 g/dL (ref 1.5–4.5)
Glucose: 86 mg/dL (ref 65–99)
Potassium: 5 mmol/L (ref 3.5–5.2)
Sodium: 139 mmol/L (ref 134–144)
Total Protein: 6.1 g/dL (ref 6.0–8.5)

## 2018-08-12 LAB — TSH: TSH: 4.13 u[IU]/mL (ref 0.450–4.500)

## 2018-08-13 DIAGNOSIS — M545 Low back pain: Secondary | ICD-10-CM | POA: Diagnosis not present

## 2018-08-13 DIAGNOSIS — R2689 Other abnormalities of gait and mobility: Secondary | ICD-10-CM | POA: Diagnosis not present

## 2018-08-14 ENCOUNTER — Ambulatory Visit (HOSPITAL_COMMUNITY)
Admission: RE | Admit: 2018-08-14 | Discharge: 2018-08-14 | Disposition: A | Discharge status: Home / Self Care | Payer: Medicare Other | Source: Ambulatory Visit | Attending: Cardiology | Admitting: Cardiology

## 2018-08-14 DIAGNOSIS — I480 Paroxysmal atrial fibrillation: Secondary | ICD-10-CM | POA: Insufficient documentation

## 2018-08-14 DIAGNOSIS — I4891 Unspecified atrial fibrillation: Secondary | ICD-10-CM | POA: Diagnosis not present

## 2018-08-20 DIAGNOSIS — E7800 Pure hypercholesterolemia, unspecified: Secondary | ICD-10-CM | POA: Diagnosis not present

## 2018-08-20 DIAGNOSIS — E039 Hypothyroidism, unspecified: Secondary | ICD-10-CM | POA: Diagnosis not present

## 2018-08-20 DIAGNOSIS — G2581 Restless legs syndrome: Secondary | ICD-10-CM | POA: Diagnosis not present

## 2018-08-20 DIAGNOSIS — E440 Moderate protein-calorie malnutrition: Secondary | ICD-10-CM | POA: Diagnosis not present

## 2018-08-20 DIAGNOSIS — I5032 Chronic diastolic (congestive) heart failure: Secondary | ICD-10-CM | POA: Diagnosis not present

## 2018-08-20 DIAGNOSIS — Z0000 Encounter for general adult medical examination without abnormal findings: Secondary | ICD-10-CM | POA: Diagnosis not present

## 2018-08-20 DIAGNOSIS — R413 Other amnesia: Secondary | ICD-10-CM | POA: Diagnosis not present

## 2018-08-20 DIAGNOSIS — I10 Essential (primary) hypertension: Secondary | ICD-10-CM | POA: Diagnosis not present

## 2018-08-20 DIAGNOSIS — R2689 Other abnormalities of gait and mobility: Secondary | ICD-10-CM | POA: Diagnosis not present

## 2018-08-20 DIAGNOSIS — R600 Localized edema: Secondary | ICD-10-CM | POA: Diagnosis not present

## 2018-08-20 DIAGNOSIS — I480 Paroxysmal atrial fibrillation: Secondary | ICD-10-CM | POA: Diagnosis not present

## 2018-08-20 DIAGNOSIS — M545 Low back pain: Secondary | ICD-10-CM | POA: Diagnosis not present

## 2018-08-20 DIAGNOSIS — Z1389 Encounter for screening for other disorder: Secondary | ICD-10-CM | POA: Diagnosis not present

## 2018-08-20 DIAGNOSIS — M810 Age-related osteoporosis without current pathological fracture: Secondary | ICD-10-CM | POA: Diagnosis not present

## 2018-08-22 DIAGNOSIS — M545 Low back pain: Secondary | ICD-10-CM | POA: Diagnosis not present

## 2018-08-22 DIAGNOSIS — R2689 Other abnormalities of gait and mobility: Secondary | ICD-10-CM | POA: Diagnosis not present

## 2018-08-26 DIAGNOSIS — M545 Low back pain: Secondary | ICD-10-CM | POA: Diagnosis not present

## 2018-08-26 DIAGNOSIS — R2689 Other abnormalities of gait and mobility: Secondary | ICD-10-CM | POA: Diagnosis not present

## 2018-08-28 DIAGNOSIS — R2689 Other abnormalities of gait and mobility: Secondary | ICD-10-CM | POA: Diagnosis not present

## 2018-08-28 DIAGNOSIS — M545 Low back pain: Secondary | ICD-10-CM | POA: Diagnosis not present

## 2018-09-02 DIAGNOSIS — M545 Low back pain: Secondary | ICD-10-CM | POA: Diagnosis not present

## 2018-09-02 DIAGNOSIS — R2689 Other abnormalities of gait and mobility: Secondary | ICD-10-CM | POA: Diagnosis not present

## 2018-09-05 DIAGNOSIS — M545 Low back pain: Secondary | ICD-10-CM | POA: Diagnosis not present

## 2018-09-05 DIAGNOSIS — R2689 Other abnormalities of gait and mobility: Secondary | ICD-10-CM | POA: Diagnosis not present

## 2018-09-09 DIAGNOSIS — M5136 Other intervertebral disc degeneration, lumbar region: Secondary | ICD-10-CM | POA: Diagnosis not present

## 2018-09-11 DIAGNOSIS — M545 Low back pain: Secondary | ICD-10-CM | POA: Diagnosis not present

## 2018-09-11 DIAGNOSIS — R2689 Other abnormalities of gait and mobility: Secondary | ICD-10-CM | POA: Diagnosis not present

## 2018-09-16 DIAGNOSIS — M545 Low back pain: Secondary | ICD-10-CM | POA: Diagnosis not present

## 2018-09-16 DIAGNOSIS — R2689 Other abnormalities of gait and mobility: Secondary | ICD-10-CM | POA: Diagnosis not present

## 2018-09-19 DIAGNOSIS — M545 Low back pain: Secondary | ICD-10-CM | POA: Diagnosis not present

## 2018-09-19 DIAGNOSIS — R2689 Other abnormalities of gait and mobility: Secondary | ICD-10-CM | POA: Diagnosis not present

## 2018-09-23 DIAGNOSIS — R2689 Other abnormalities of gait and mobility: Secondary | ICD-10-CM | POA: Diagnosis not present

## 2018-09-23 DIAGNOSIS — M545 Low back pain: Secondary | ICD-10-CM | POA: Diagnosis not present

## 2018-09-26 DIAGNOSIS — R2689 Other abnormalities of gait and mobility: Secondary | ICD-10-CM | POA: Diagnosis not present

## 2018-09-26 DIAGNOSIS — M545 Low back pain: Secondary | ICD-10-CM | POA: Diagnosis not present

## 2018-09-29 ENCOUNTER — Other Ambulatory Visit: Payer: Self-pay | Admitting: Cardiology

## 2018-09-30 DIAGNOSIS — M5136 Other intervertebral disc degeneration, lumbar region: Secondary | ICD-10-CM | POA: Diagnosis not present

## 2018-09-30 NOTE — Telephone Encounter (Signed)
Ok to refill Kayla Chambers  

## 2018-10-01 NOTE — Telephone Encounter (Signed)
Ok to refill per Dr.Crenshaw. Refill auth phoned in Alprazolam 0.5 mg at bedtime #90 R-0

## 2018-10-03 DIAGNOSIS — M545 Low back pain: Secondary | ICD-10-CM | POA: Diagnosis not present

## 2018-10-03 DIAGNOSIS — R2689 Other abnormalities of gait and mobility: Secondary | ICD-10-CM | POA: Diagnosis not present

## 2018-10-08 ENCOUNTER — Other Ambulatory Visit: Payer: Self-pay | Admitting: Cardiology

## 2018-10-08 DIAGNOSIS — E785 Hyperlipidemia, unspecified: Secondary | ICD-10-CM

## 2018-10-10 DIAGNOSIS — R2689 Other abnormalities of gait and mobility: Secondary | ICD-10-CM | POA: Diagnosis not present

## 2018-10-10 DIAGNOSIS — M545 Low back pain: Secondary | ICD-10-CM | POA: Diagnosis not present

## 2018-10-14 ENCOUNTER — Other Ambulatory Visit: Payer: Self-pay | Admitting: Cardiology

## 2018-10-14 DIAGNOSIS — M545 Low back pain: Secondary | ICD-10-CM | POA: Diagnosis not present

## 2018-10-14 DIAGNOSIS — R2689 Other abnormalities of gait and mobility: Secondary | ICD-10-CM | POA: Diagnosis not present

## 2018-10-14 NOTE — Telephone Encounter (Signed)
Rx request sent to pharmacy.  

## 2018-10-17 DIAGNOSIS — M545 Low back pain: Secondary | ICD-10-CM | POA: Diagnosis not present

## 2018-10-17 DIAGNOSIS — R2689 Other abnormalities of gait and mobility: Secondary | ICD-10-CM | POA: Diagnosis not present

## 2018-10-21 DIAGNOSIS — R2689 Other abnormalities of gait and mobility: Secondary | ICD-10-CM | POA: Diagnosis not present

## 2018-10-21 DIAGNOSIS — M545 Low back pain: Secondary | ICD-10-CM | POA: Diagnosis not present

## 2018-10-24 DIAGNOSIS — R2689 Other abnormalities of gait and mobility: Secondary | ICD-10-CM | POA: Diagnosis not present

## 2018-10-24 DIAGNOSIS — M545 Low back pain: Secondary | ICD-10-CM | POA: Diagnosis not present

## 2018-10-28 DIAGNOSIS — M545 Low back pain: Secondary | ICD-10-CM | POA: Diagnosis not present

## 2018-10-28 DIAGNOSIS — R2689 Other abnormalities of gait and mobility: Secondary | ICD-10-CM | POA: Diagnosis not present

## 2018-11-07 DIAGNOSIS — M545 Low back pain: Secondary | ICD-10-CM | POA: Diagnosis not present

## 2018-11-07 DIAGNOSIS — R2689 Other abnormalities of gait and mobility: Secondary | ICD-10-CM | POA: Diagnosis not present

## 2018-11-14 ENCOUNTER — Other Ambulatory Visit: Payer: Self-pay | Admitting: Cardiology

## 2018-11-27 DIAGNOSIS — M5136 Other intervertebral disc degeneration, lumbar region: Secondary | ICD-10-CM | POA: Diagnosis not present

## 2018-12-12 ENCOUNTER — Other Ambulatory Visit: Payer: Self-pay | Admitting: Cardiology

## 2018-12-12 DIAGNOSIS — I480 Paroxysmal atrial fibrillation: Secondary | ICD-10-CM

## 2018-12-21 ENCOUNTER — Other Ambulatory Visit: Payer: Self-pay | Admitting: Cardiology

## 2018-12-22 NOTE — Telephone Encounter (Signed)
Ok to refill 

## 2019-01-13 ENCOUNTER — Other Ambulatory Visit: Payer: Self-pay | Admitting: Cardiology

## 2019-01-14 DIAGNOSIS — R109 Unspecified abdominal pain: Secondary | ICD-10-CM | POA: Diagnosis not present

## 2019-01-14 DIAGNOSIS — Z01419 Encounter for gynecological examination (general) (routine) without abnormal findings: Secondary | ICD-10-CM | POA: Diagnosis not present

## 2019-01-14 DIAGNOSIS — Z6825 Body mass index (BMI) 25.0-25.9, adult: Secondary | ICD-10-CM | POA: Diagnosis not present

## 2019-01-14 DIAGNOSIS — Z1231 Encounter for screening mammogram for malignant neoplasm of breast: Secondary | ICD-10-CM | POA: Diagnosis not present

## 2019-01-15 ENCOUNTER — Other Ambulatory Visit: Payer: Self-pay | Admitting: Obstetrics and Gynecology

## 2019-01-15 ENCOUNTER — Other Ambulatory Visit (HOSPITAL_COMMUNITY): Payer: Self-pay | Admitting: Obstetrics and Gynecology

## 2019-01-15 DIAGNOSIS — R140 Abdominal distension (gaseous): Secondary | ICD-10-CM

## 2019-01-15 DIAGNOSIS — R109 Unspecified abdominal pain: Secondary | ICD-10-CM

## 2019-01-20 ENCOUNTER — Ambulatory Visit (HOSPITAL_COMMUNITY)
Admission: RE | Admit: 2019-01-20 | Discharge: 2019-01-20 | Disposition: A | Discharge status: Home / Self Care | Payer: Medicare Other | Source: Ambulatory Visit | Attending: Obstetrics and Gynecology | Admitting: Obstetrics and Gynecology

## 2019-01-20 DIAGNOSIS — K7460 Unspecified cirrhosis of liver: Secondary | ICD-10-CM | POA: Diagnosis not present

## 2019-01-20 DIAGNOSIS — R140 Abdominal distension (gaseous): Secondary | ICD-10-CM | POA: Insufficient documentation

## 2019-01-20 DIAGNOSIS — R109 Unspecified abdominal pain: Secondary | ICD-10-CM | POA: Diagnosis not present

## 2019-01-28 ENCOUNTER — Other Ambulatory Visit: Payer: Self-pay | Admitting: Obstetrics and Gynecology

## 2019-01-28 DIAGNOSIS — C229 Malignant neoplasm of liver, not specified as primary or secondary: Secondary | ICD-10-CM

## 2019-01-30 DIAGNOSIS — I2510 Atherosclerotic heart disease of native coronary artery without angina pectoris: Secondary | ICD-10-CM | POA: Diagnosis not present

## 2019-01-30 DIAGNOSIS — K869 Disease of pancreas, unspecified: Secondary | ICD-10-CM | POA: Diagnosis not present

## 2019-01-30 DIAGNOSIS — R160 Hepatomegaly, not elsewhere classified: Secondary | ICD-10-CM | POA: Diagnosis not present

## 2019-01-30 DIAGNOSIS — K7460 Unspecified cirrhosis of liver: Secondary | ICD-10-CM | POA: Diagnosis not present

## 2019-01-30 DIAGNOSIS — I5032 Chronic diastolic (congestive) heart failure: Secondary | ICD-10-CM | POA: Diagnosis not present

## 2019-01-30 DIAGNOSIS — R413 Other amnesia: Secondary | ICD-10-CM | POA: Diagnosis not present

## 2019-01-30 DIAGNOSIS — Z952 Presence of prosthetic heart valve: Secondary | ICD-10-CM | POA: Diagnosis not present

## 2019-02-03 ENCOUNTER — Other Ambulatory Visit: Payer: Self-pay

## 2019-02-03 ENCOUNTER — Ambulatory Visit (HOSPITAL_COMMUNITY)
Admission: RE | Admit: 2019-02-03 | Discharge: 2019-02-03 | Disposition: A | Discharge status: Home / Self Care | Payer: Medicare Other | Source: Ambulatory Visit | Attending: Obstetrics and Gynecology | Admitting: Obstetrics and Gynecology

## 2019-02-03 DIAGNOSIS — C229 Malignant neoplasm of liver, not specified as primary or secondary: Secondary | ICD-10-CM

## 2019-02-03 DIAGNOSIS — K769 Liver disease, unspecified: Secondary | ICD-10-CM | POA: Diagnosis not present

## 2019-02-03 LAB — POCT I-STAT CREATININE: Creatinine, Ser: 0.8 mg/dL (ref 0.44–1.00)

## 2019-02-03 MED ORDER — GADOBUTROL 1 MMOL/ML IV SOLN
7.0000 mL | Freq: Once | INTRAVENOUS | Status: AC | PRN
  Administered 2019-02-03: 7 mL via INTRAVENOUS

## 2019-02-10 ENCOUNTER — Other Ambulatory Visit: Payer: Self-pay | Admitting: Cardiology

## 2019-02-19 ENCOUNTER — Telehealth: Payer: Self-pay

## 2019-02-19 NOTE — Telephone Encounter (Signed)
Called number listed for Home, patient's husband Ellamae Lybeck who is on patient's DPR answered and informed me that it was his cell phone and that he will have her call back when he gets back to the house

## 2019-02-19 NOTE — Progress Notes (Signed)
Virtual Visit via Telephone Note    Evaluation Performed:  Follow-up visit  This visit type was conducted due to national recommendations for restrictions regarding the COVID-19 Pandemic (e.g. social distancing).  This format is felt to be most appropriate for this patient at this time.  All issues noted in this document were discussed and addressed.  No physical exam was performed (except for noted visual exam findings with Video Visits).  Please refer to the patient's chart (MyChart message for video visits and phone note for telephone visits) for the patient's consent to telehealth for [facility].  Date:  02/20/2019   ID:  Kayla Chambers, DOB [birth year], MRN [id number]  Patient Location:  Kayla Chambers  Provider location:   [place]  PCP:  Donald Prose, MD  Cardiologist:  [name], MD   Chief Complaint:  Fu atrial fibrillation  History of Present Illness:    Kayla Chambers is a 85 y.o. female who presents via audio/video conferencing for a telehealth visit today.    The patient does not have symptoms concerning for COVID-19 infection (fever, chills, cough, or new shortness of breath).   Follow-up CADstatus post coronary artery bypass graft, Bentall/AVR and maze procedure. She underwent a Bentall procedure by Dr. Servando Snare on 03/31/13. 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 Preoperatively she had been in and out of atrial fibrillation. Carotid Dopplers March 2017 showed 1-39% bilateral stenosis.Has had recurrent atrial fibrillation.Recently seen with recurrent atrial fibrillation and CHF; diuretics increased and amiodarone initiated; had repeat DCCV 10/22/17. Seen in fu and noted to be in atrial flutter. Plan was repeat DCCV following full amiodarone load.Last echocardiogram November 2018 showed normal LV function, bioprosthetic aortic  valve, mild mitral regurgitation, mild left atrial enlargement,severe right atrial enlargement and severe TR.Abd CT 5/19 showed 1 cm lesion in left lobe of liver; MRI recommended. Since last seen,she has not had chest pain, dyspnea, palpitations, syncope, bleeding or increased pedal edema.   Past Medical History:  Diagnosis Date   Anxiety    takes xanax for sleep.    Aortic insufficiency    a. 03/2013 s/p  AVR w/ biologic 66mm Magna Ease peric tissue valve, model 300TFX, ser# [id number].    Back pain    BACK BRACE/FRACTURE   CAD (coronary artery disease)    a. 01/2013 mod calcification w/o sev dzs;  b. 03/2013 CABG x1 VG->RCA @ time of AVR   Chronic diastolic CHF (congestive heart failure) ([place])    a. 01/2013 echo: EF 50-55%.   Dementia ([place])    Dilated aortic root ([place])    a. 03/2013 s/p Bentall procedure with Ao Root replacement (22mm Valsalva graft) w/ reimplantationof the R and L coronary ostium.   Dysrhythmia    A. 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 Neoadjuvant RT by Dr. Yisroel Ramming. Surveillance plan: MRI 12/08/11 shows changes in presumed post-op seroma/hematoma with slight increase in size - follow up MRI 11/18/12 showed same size. Will continue with q3 month MRI. CT C/A/P annually alternating with CXR at 6 months.    Osteoporosis    Paroxysmal A-fib ([place])    a. Dx  01/2013 - placed on xarelto;  b. 03/2013 s/p left sided MAZE and LA clip @ time of AVR/CABG.   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   BLADDER SUSPENSION     CARDIOVERSION N/A 01/29/2017   Procedure: CARDIOVERSION;  Surgeon: Jerline Pain, MD;  Location: [place];  Service: Cardiovascular;  Laterality: N/A;   CARDIOVERSION N/A 10/22/2017   Procedure: CARDIOVERSION;  Surgeon: Lelon Perla, MD;  Location: [facility] ENDOSCOPY;  Service: Cardiovascular;  Laterality: N/A;   COLONOSCOPY  10/25/2011   Procedure: COLONOSCOPY;  Surgeon: Garlan Fair, MD;  Location: [facility] ENDOSCOPY;  Service: Endoscopy;  Laterality: N/A;   EYE SURGERY Bilateral    cataracts   INTRAOPERATIVE TRANSESOPHAGEAL ECHOCARDIOGRAM N/A 03/31/2013   Procedure: INTRAOPERATIVE TRANSESOPHAGEAL ECHOCARDIOGRAM;  Surgeon: Grace Isaac, MD;  Location: [place];  Service: Open Heart Surgery;  Laterality: N/A;   KYPHOPLASTY N/A 05/07/2018   Procedure: KYPHOPLASTY LUMBAR THREE;  Surgeon: Melina Schools, MD;  Location: [place];  Service: Orthopedics;  Laterality: N/A;   LEFT AND RIGHT HEART CATHETERIZATION WITH CORONARY ANGIOGRAM Right 02/02/2013   Procedure: LEFT AND RIGHT HEART CATHETERIZATION WITH CORONARY ANGIOGRAM;  Surgeon: Hillary Bow, MD;  Location: [facility] CATH LAB;  Service: Cardiovascular;  Laterality: Right;   MAZE N/A 03/31/2013   Procedure: MAZE;  Surgeon: Grace Isaac, MD;  Location: [place];  Service: Open Heart Surgery;  Laterality: N/A;   Sarcoma resection  2011     Current Meds  Medication Sig   ALPRAZolam (XANAX) 0.5 MG tablet TAKE 1 TABLET BY MOUTH EVERY DAY   amiodarone (PACERONE) 200 MG tablet TAKE 1 TABLET BY MOUTH TWICE A DAY (Patient taking differently: Take 200 mg by mouth daily. )   apixaban (ELIQUIS) 2.5 MG TABS tablet Take 1 tablet (2.5 mg total) by mouth 2 (two) times daily.   Ascorbic Acid (VITAMIN C PO) Take 1 tablet by mouth daily as  needed (FOR IMMUNE SYSTEM BOOST).    Calcium Carbonate-Vitamin D (CALCIUM + D PO) Take 1 tablet by mouth daily. CALCIBOOST   Cholecalciferol (VITAMIN D3) 2000 units capsule Take 2,000 Units by mouth daily.   CRANBERRY PO Take 1 tablet by mouth 2 (two) times daily as needed (FOR BLADDER/URINARY ISSUES.).    furosemide (LASIX) 40 MG tablet TAKE 1 TABLET BY MOUTH TWICE DAILY FOR 2 DAYS THEN TAKE 1 TABLET BY MOUTH EVERY DAY (Patient taking differently: Take 40 mg by mouth daily. )   lactase (LACTAID) 3000 UNITS tablet Take 1 tablet by mouth daily as needed (only when eating dairy).   levothyroxine (SYNTHROID, LEVOTHROID) 75 MCG tablet Take 75 mcg by mouth at bedtime.    Loperamide HCl (LOPERAMIDE A-D PO) Take 1 tablet by mouth 4 (four) times daily as needed (diarrhea/loose stools.).    Multiple Vitamin (MULTIVITAMIN WITH MINERALS) TABS Take 1 tablet by mouth daily.   ondansetron (ZOFRAN ODT) 4 MG disintegrating tablet Take 1 tablet (4 mg total) by mouth every 8 (eight) hours as needed for nausea  or vomiting.   polyethylene glycol (MIRALAX / GLYCOLAX) packet Take 17 g by mouth daily.   potassium chloride SA (K-DUR,KLOR-CON) 20 MEQ tablet TAKE 1 TABLET BY MOUTH EVERY DAY   pravastatin (PRAVACHOL) 40 MG tablet TAKE 1 TABLET BY MOUTH EVERY EVENING   Probiotic Product (PROBIOTIC PO) Take 1 capsule by mouth daily as needed (FOR DIGESTIVE HEALTH.).   rOPINIRole (REQUIP) 0.25 MG tablet Take 0.25 mg by mouth at bedtime as needed (for restless leg syndrome.).    [DISCONTINUED] Omega-3 Fatty Acids (FISH OIL PO) Take 1 tablet by mouth daily.     Allergies:   Alendronate sodium; Ambien [zolpidem tartrate]; Lac bovis; Lyrica [pregabalin]; Milk-related compounds; Neurontin [gabapentin]; Procaine hcl; and Toprol xl [metoprolol succinate]   Social History   Tobacco Use   Smoking status: Never Smoker   Smokeless tobacco: Never Used  Substance Use Topics   Alcohol use: No   Drug use: No      Family Hx: The patient's family history includes Anesthesia problems in her brother; Arthritis in her father; Cancer in her father; Heart disease in her mother; Hypertension in her sister; Stroke in her mother.  ROS:   Please see the history of present illness.    No fevers, chills, productive cough or hemoptysis. All other systems reviewed and are negative.   Labs/Other Tests and Data Reviewed:    Recent Labs: 04/09/2018: Magnesium 2.0 08/11/2018: ALT 18; BUN 15; Hemoglobin 12.4; Platelets 179; Potassium 5.0; Sodium 139; TSH 4.130 02/03/2019: Creatinine, Ser 0.80   Recent Lipid Panel Lab Results  Component Value Date/Time   CHOL 154 05/16/2017 12:18 PM   TRIG 97 05/16/2017 12:18 PM   HDL 57 05/16/2017 12:18 PM   CHOLHDL 2.7 05/16/2017 12:18 PM   CHOLHDL 2.3 05/04/2016 08:20 AM   LDLCALC 78 05/16/2017 12:18 PM    Wt Readings from Last 3 Encounters:  02/20/19 140 lb 12.8 oz (63.9 kg)  08/11/18 132 lb (59.9 kg)  05/07/18 127 lb 6.8 oz (57.8 kg)     Objective:    Vital Signs:  BP 124/84    Pulse (!) 54    Ht 5\' 5"  (1.651 m)    Wt 140 lb 12.8 oz (63.9 kg)    BMI 23.43 kg/m    Physical exam not performed as this was a telemedicine visit.  ASSESSMENT & PLAN:    1.  Paroxysmal atrial fibrillation-patient has not had symptoms of palpitations or worsening congestive heart failure.  We will continue amiodarone at present dose.  Continue apixaban.  We will need to arrange PA and lateral chest x-ray, liver functions and TSH as well as CBC and bmet in the next several months.  2 coronary artery disease-plan to continue statin.  Patient is not on aspirin given need for anticoagulation.  3 chronic diastolic congestive heart failure-patient cannot be examined as this is a following visit due to coronavirus pandemic.  However there are no increasing symptoms of dyspnea or pedal edema.  We will continue present dose of Lasix.  Continue fluid restriction and low-sodium diet.  She will  need potassium and renal function checked in the next several months.  4 hypertension-patient's blood pressure is controlled.  Continue present medications and follow.  5 aortic valve replacement-continue SBE prophylaxis.  6 hyperlipidemia-continue statin.  7 history of tricuspid regurgitation-given her age and overall medical condition we are treating conservatively.  COVID-19 Education: The signs and symptoms of COVID-19 were discussed with the patient and how to seek care for  testing (follow up with PCP or arrange E-visit). The importance of social distancing was discussed today.  Patient Risk:   After full review of this patient's clinical status, I feel that they are at least moderate risk at this time.  Time:   Today, I have spent 20 minutes with the patient with telehealth technology discussing atrial fibrillation, hypertension and congestive heart failure symptoms.     Medication Adjustments/Labs and Tests Ordered: Current medicines are reviewed at length with the patient today.  Concerns regarding medicines are outlined above.  Tests Ordered: No orders of the defined types were placed in this encounter.  Medication Changes: No orders of the defined types were placed in this encounter.   Disposition:  Follow up in 6 month(s)  Signed, [name], MD  02/20/2019 3:37 PM    [facility]

## 2019-02-19 NOTE — Telephone Encounter (Signed)
Virtual Visit Pre-Appointment Phone Call  Steps For Call:  1. Confirm consent - "In the setting of the current Covid19 crisis, you are scheduled for a (phone or video) visit with your provider on (date) at (time).  Just as we do with many in-office visits, in order for you to participate in this visit, we must obtain consent.  If you'd like, I can send this to your mychart (if signed up) or email for you to review.  Otherwise, I can obtain your verbal consent now.  All virtual visits are billed to your insurance company just like a normal visit would be.  By agreeing to a virtual visit, we'd like you to understand that the technology does not allow for your provider to perform an examination, and thus may limit your provider's ability to fully assess your condition.  Finally, though the technology is pretty good, we cannot assure that it will always work on either your or our end, and in the setting of a video visit, we may have to convert it to a phone-only visit.  In either situation, we cannot ensure that we have a secure connection.  Are you willing to proceed?"  2. Give patient instructions for WebEx download to smartphone as below if video visit  3. Advise patient to be prepared with any vital sign or heart rhythm information, their current medicines, and a piece of paper and pen handy for any instructions they may receive the day of their visit  4. Inform patient they will receive a phone call 15 minutes prior to their appointment time (may be from unknown caller ID) so they should be prepared to answer  5. Confirm that appointment type is correct in Epic appointment notes (video vs telephone)    TELEPHONE CALL NOTE  Kayla Chambers has been deemed a candidate for a follow-up tele-health visit to limit community exposure during the Covid-19 pandemic. I spoke with the patient via phone to ensure availability of phone/video source, confirm preferred email & phone number, and discuss  instructions and expectations.  I reminded Kayla Chambers to be prepared with any vital sign and/or heart rhythm information that could potentially be obtained via home monitoring, at the time of her visit. I reminded Kayla Chambers to expect a phone call at the time of her visit if her visit.  Did the patient verbally acknowledge consent to treatment? Yes   Kayla Chambers, [facility] 02/19/2019 3:17 PM   DOWNLOADING THE [place], go to [employer] and type in WebEx in the search bar. [place] [employer], the blue/green circle. The app is free but as with any other app downloads, their phone may require them to verify saved payment information or Apple password. The patient does NOT have to create an account.  - If Android, ask patient to go to [employer] and type in WebEx in the search bar. [place] [employer], the blue/green circle. The app is free but as with any other app downloads, their phone may require them to verify saved payment information or Android password. The patient does NOT have to create an account.   CONSENT FOR TELE-HEALTH VISIT - PLEASE REVIEW  I hereby voluntarily request, consent and authorize [facility] and its employed or contracted physicians, physician assistants, nurse practitioners or other licensed health care professionals (the Practitioner), to provide me with telemedicine health care services (the "Services") as deemed necessary by the treating Practitioner.  I acknowledge and consent to receive the Services by the Practitioner via telemedicine. I understand that the telemedicine visit will involve communicating with the Practitioner through live audiovisual communication technology and the disclosure of certain medical information by electronic transmission. I acknowledge that I have been given the opportunity to request an in-person assessment or other available alternative prior to the telemedicine visit  and am voluntarily participating in the telemedicine visit.  I understand that I have the right to withhold or withdraw my consent to the use of telemedicine in the course of my care at any time, without affecting my right to future care or treatment, and that the Practitioner or I may terminate the telemedicine visit at any time. I understand that I have the right to inspect all information obtained and/or recorded in the course of the telemedicine visit and may receive copies of available information for a reasonable fee.  I understand that some of the potential risks of receiving the Services via telemedicine include:  Marland Kitchen Delay or interruption in medical evaluation due to technological equipment failure or disruption; . Information transmitted may not be sufficient (e.g. poor resolution of images) to allow for appropriate medical decision making by the Practitioner; and/or  . In rare instances, security protocols could fail, causing a breach of personal health information.  Furthermore, I acknowledge that it is my responsibility to provide information about my medical history, conditions and care that is complete and accurate to the best of my ability. I acknowledge that Practitioner's advice, recommendations, and/or decision may be based on factors not within their control, such as incomplete or inaccurate data provided by me or distortions of diagnostic images or specimens that may result from electronic transmissions. I understand that the practice of medicine is not an exact science and that Practitioner makes no warranties or guarantees regarding treatment outcomes. I acknowledge that I will receive a copy of this consent concurrently upon execution via email to the email address I last provided but may also request a printed copy by calling the office of [place].    I understand that my insurance will be billed for this visit.   I have read or had this consent read to me. . I understand  the contents of this consent, which adequately explains the benefits and risks of the Services being provided via telemedicine.  . I have been provided ample opportunity to ask questions regarding this consent and the Services and have had my questions answered to my satisfaction. . I give my informed consent for the services to be provided through the use of telemedicine in my medical care  By participating in this telemedicine visit I agree to the above.

## 2019-02-20 ENCOUNTER — Encounter: Payer: Self-pay | Admitting: Cardiology

## 2019-02-20 ENCOUNTER — Telehealth (INDEPENDENT_AMBULATORY_CARE_PROVIDER_SITE_OTHER): Payer: Medicare Other | Admitting: Cardiology

## 2019-02-20 VITALS — BP 124/84 | HR 54 | Ht 65.0 in | Wt 140.8 lb

## 2019-02-20 DIAGNOSIS — I2510 Atherosclerotic heart disease of native coronary artery without angina pectoris: Secondary | ICD-10-CM

## 2019-02-20 DIAGNOSIS — I480 Paroxysmal atrial fibrillation: Secondary | ICD-10-CM

## 2019-02-20 DIAGNOSIS — I5032 Chronic diastolic (congestive) heart failure: Secondary | ICD-10-CM

## 2019-02-20 DIAGNOSIS — I10 Essential (primary) hypertension: Secondary | ICD-10-CM

## 2019-02-20 NOTE — Telephone Encounter (Signed)
This encounter was created in error - please disregard.

## 2019-02-20 NOTE — Patient Instructions (Signed)
Medication Instructions:  NO CHANGE If you need a refill on your cardiac medications before your next appointment, please call your pharmacy.   Lab work: Your physician recommends that you return for lab work in: 6-8 WEEKS If you have labs (blood work) drawn today and your tests are completely normal, you will receive your results only by: Marland Kitchen MyChart Message (if you have MyChart) OR . A paper copy in the mail If you have any lab test that is abnormal or we need to change your treatment, we will call you to review the results.  Testing/Procedures: A chest x-ray takes a picture of the organs and structures inside the chest, including the heart, lungs, and blood vessels. This test can show several things, including, whether the heart is enlarges; whether fluid is building up in the lungs; and whether pacemaker / defibrillator leads are still in place. AT [place]- 6-8 WEEKS  Follow-Up: At [facility], you and your health needs are our priority.  As part of our continuing mission to provide you with exceptional heart care, we have created designated Provider Care Teams.  These Care Teams include your primary Cardiologist (physician) and Advanced Practice Providers (APPs -  Physician Assistants and Nurse Practitioners) who all work together to provide you with the care you need, when you need it. You will need a follow up appointment in 6 months.  Please call our office 2 months in advance to schedule this appointment.  You may see [name], MD or one of the following Advanced Practice Providers on your designated Care Team:   Kerin Ransom, PA-C Roby Lofts, [place] . Sande Rives, PA-C

## 2019-02-20 NOTE — Telephone Encounter (Signed)
New message:   Patient states someone called him and his appt is at 4pm.

## 2019-03-03 ENCOUNTER — Ambulatory Visit: Payer: Medicare Other | Admitting: Cardiology

## 2019-03-06 DIAGNOSIS — K9089 Other intestinal malabsorption: Secondary | ICD-10-CM | POA: Diagnosis not present

## 2019-03-06 DIAGNOSIS — I480 Paroxysmal atrial fibrillation: Secondary | ICD-10-CM | POA: Diagnosis not present

## 2019-03-06 DIAGNOSIS — K7460 Unspecified cirrhosis of liver: Secondary | ICD-10-CM | POA: Diagnosis not present

## 2019-03-06 DIAGNOSIS — K769 Liver disease, unspecified: Secondary | ICD-10-CM | POA: Diagnosis not present

## 2019-04-04 ENCOUNTER — Other Ambulatory Visit: Payer: Self-pay | Admitting: Cardiology

## 2019-04-10 DIAGNOSIS — I2510 Atherosclerotic heart disease of native coronary artery without angina pectoris: Secondary | ICD-10-CM | POA: Diagnosis not present

## 2019-04-10 DIAGNOSIS — I5032 Chronic diastolic (congestive) heart failure: Secondary | ICD-10-CM | POA: Diagnosis not present

## 2019-04-10 DIAGNOSIS — I480 Paroxysmal atrial fibrillation: Secondary | ICD-10-CM | POA: Diagnosis not present

## 2019-04-10 DIAGNOSIS — I10 Essential (primary) hypertension: Secondary | ICD-10-CM | POA: Diagnosis not present

## 2019-04-10 DIAGNOSIS — I110 Hypertensive heart disease with heart failure: Secondary | ICD-10-CM | POA: Diagnosis not present

## 2019-04-10 DIAGNOSIS — E039 Hypothyroidism, unspecified: Secondary | ICD-10-CM | POA: Diagnosis not present

## 2019-04-10 DIAGNOSIS — M810 Age-related osteoporosis without current pathological fracture: Secondary | ICD-10-CM | POA: Diagnosis not present

## 2019-04-11 ENCOUNTER — Other Ambulatory Visit: Payer: Self-pay | Admitting: Cardiology

## 2019-04-28 DIAGNOSIS — M4854XA Collapsed vertebra, not elsewhere classified, thoracic region, initial encounter for fracture: Secondary | ICD-10-CM | POA: Diagnosis not present

## 2019-04-28 DIAGNOSIS — M545 Low back pain: Secondary | ICD-10-CM | POA: Diagnosis not present

## 2019-04-28 DIAGNOSIS — M546 Pain in thoracic spine: Secondary | ICD-10-CM | POA: Diagnosis not present

## 2019-04-29 DIAGNOSIS — M5136 Other intervertebral disc degeneration, lumbar region: Secondary | ICD-10-CM | POA: Diagnosis not present

## 2019-05-04 DIAGNOSIS — M4854XA Collapsed vertebra, not elsewhere classified, thoracic region, initial encounter for fracture: Secondary | ICD-10-CM | POA: Diagnosis not present

## 2019-05-06 ENCOUNTER — Telehealth: Payer: Self-pay

## 2019-05-06 ENCOUNTER — Encounter: Payer: Self-pay | Admitting: Cardiology

## 2019-05-06 NOTE — Telephone Encounter (Signed)
Patient with diagnosis of afib on Eliquis for anticoagulation.    Procedure: T11 Kyphoplasty  Date of procedure: TBD  CHADS2-VASc score of  6 (CHF, HTN, AGE, DM2, stroke/tia x 2, CAD, AGE, female)  CrCl 39 ml/min  Per office protocol, patient can hold Eliquis for 3 days prior to procedure.

## 2019-05-06 NOTE — Telephone Encounter (Signed)
   [facility] Pre-operative Risk Assessment    Request for surgical clearance:  1. What type of surgery is being performed? T11 Kyphoplasty   2. When is this surgery scheduled? pending   3. What type of clearance is required (medical clearance vs. Pharmacy clearance to hold med vs. Both)? both  4. Are there any medications that need to be held prior to surgery and how long? Eliquis 2.5 mg BID   5. Practice name and name of physician performing surgery? [facility]  Dr Melina Schools   6. What is your office phone number? [phone]    7.   What is your office fax number? [phone]  8.   Anesthesia type (None, local, MAC, general)? general   Deshone Lyssy, [place] 05/06/2019, 3:49 PM  _________________________________________________________________   (provider comments below)

## 2019-05-06 NOTE — Telephone Encounter (Signed)
error 

## 2019-05-07 NOTE — Telephone Encounter (Signed)
   Primary Cardiologist: [name], MD  Chart reviewed as part of pre-operative protocol coverage. Given past medical history and time since last visit, based on ACC/AHA guidelines, MAR ZETTLER would be at acceptable risk for the planned procedure without further cardiovascular testing. Based on her last office visit with Dr. Stanford Breed, she had no specific complaints of chest pain, palpitations or fluid volume overload. She was tolerating her medical regimen well. Patient has a diagnosis of afib on Eliquis for anticoagulation.    Per pharmacy:  Procedure: T11 Kyphoplasty Date of procedure: TBD  CHADS2-VASc score of  6 (CHF, HTN, AGE, DM2, stroke/tia x 2, CAD, AGE, female)  CrCl 39 ml/min  Per office protocol, patient can hold Eliquis for 3 days prior to procedure.     I will route this recommendation to the requesting party via Epic fax function and remove from pre-op pool.  Please call with questions.  Kathyrn Drown, NP 05/07/2019, 8:39 AM

## 2019-05-08 ENCOUNTER — Ambulatory Visit: Payer: Self-pay | Admitting: Orthopedic Surgery

## 2019-05-08 NOTE — H&P (Signed)
Subjective:    The patient is here today for a pre-operative History and Physical. They are scheduled for Kyphoplasty T11 on 05-14-19 with Dr. Rolena Infante at [facility].  Patient Active Problem List   Diagnosis Date Noted  . S/P kyphoplasty 05/07/2018  . Vertebral fracture, osteoporotic, initial encounter ([place]) 04/09/2018  . Compression fracture of vertebrae ([place]) 04/09/2018  . Rheumatic heart disease   . PMR (polymyalgia rheumatica) (HCC)   . Paroxysmal A-fib ([place])   . Intracranial aneurysm   . Fibromyalgia   . Anxiety   . H/O aortic valve replacement 02/02/2016  . Cerebrovascular disease 02/02/2016  . Hyperlipidemia 02/02/2016  . Vertigo 01/07/2015  . Bronchitis 12/02/2013  . Malaise and fatigue 05/26/2013  . PAF (paroxysmal atrial fibrillation) ([place]) 04/08/2013  . HTN (hypertension) 04/08/2013  . Chronic diastolic CHF (congestive heart failure) ([place]) 04/08/2013  . CAD (coronary artery disease) 04/08/2013  . Acute on chronic diastolic heart failure ([place]) 01/31/2013  . Disorder of liver 10/07/2012  . Solitary pulmonary nodule 10/07/2012  . 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 Elevated blood pressure 09/12/2011  . Dizziness 08/09/2011  . Peripheral neuropathy 03/07/2011  . Aortic insufficiency 03/06/2011  . Dilated aortic root ([place]) 03/06/2011  . 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 Hypothyroidism 03/06/2011  . Polymyalgia rheumatica ([place]) 03/06/2011   Past Medical History:  Diagnosis Date  . Anxiety    takes xanax for sleep.   . Aortic insufficiency    a. 03/2013 s/p  AVR w/ biologic 8mm Magna Ease peric tissue valve, model 300TFX, ser# [id number].   . Back pain    BACK BRACE/FRACTURE  . CAD (coronary artery disease)    a. 01/2013 mod calcification w/o sev dzs;  b. 03/2013 CABG x1 VG->RCA @ time of AVR  . Chronic diastolic CHF (congestive heart failure) ([place])    a. 01/2013 echo: EF 50-55%.  . Dementia ([place])   . Dilated aortic root ([place])    a.  03/2013 s/p [place] procedure with Ao Root replacement (8mm Valsalva graft) w/ reimplantationof the R and L coronary ostium.  Marland Kitchen Dysrhythmia    A. FIB  . Fibromyalgia   . Headache(784.0)    hx migraines  . Hypothyroidism   . Intracranial aneurysm    in late 1970's  . 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 Neoadjuvant RT by Dr. Yisroel Ramming. Surveillance plan: MRI 12/08/11 shows changes in presumed post-op seroma/hematoma with slight increase in size - follow up MRI 11/18/12 showed same size. Will continue with q3 month MRI. CT C/A/P annually alternating with CXR at 6 months.   . Osteoporosis   . Paroxysmal A-fib ([place])    a. Dx 01/2013 - placed on xarelto;  b. 03/2013 s/p left sided MAZE and LA clip @ time of AVR/CABG.  . Peripheral neuropathy   . PMR (polymyalgia rheumatica) (HCC)    Inactive  . Rheumatic heart disease   . 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 Vertigo     Past Surgical History:  Procedure Laterality Date  . ABDOMINAL HYSTERECTOMY    . ASCENDING AORTIC ROOT REPLACEMENT N/A 03/31/2013   Procedure: ASCENDING AORTIC ROOT REPLACEMENT;  Surgeon: Grace Isaac, MD;  Location: [facility];  Service: Open Heart Surgery;  Laterality: N/A; clip inserted.  Marland Kitchen BLADDER SUSPENSION    . CARDIOVERSION N/A 01/29/2017  Procedure: CARDIOVERSION;  Surgeon: Jerline Pain, MD;  Location: [facility] ENDOSCOPY;  Service: Cardiovascular;  Laterality: N/A;  . CARDIOVERSION N/A 10/22/2017   Procedure: CARDIOVERSION;  Surgeon: Lelon Perla, MD;  Location: [facility] ENDOSCOPY;  Service: Cardiovascular;  Laterality: N/A;  . COLONOSCOPY  10/25/2011   Procedure: COLONOSCOPY;  Surgeon: Garlan Fair, MD;  Location: [facility] ENDOSCOPY;  Service: Endoscopy;  Laterality: N/A;  . EYE SURGERY Bilateral    cataracts  . INTRAOPERATIVE TRANSESOPHAGEAL ECHOCARDIOGRAM N/A 03/31/2013   Procedure:  INTRAOPERATIVE TRANSESOPHAGEAL ECHOCARDIOGRAM;  Surgeon: Grace Isaac, MD;  Location: [place];  Service: Open Heart Surgery;  Laterality: N/A;  . KYPHOPLASTY N/A 05/07/2018   Procedure: KYPHOPLASTY LUMBAR THREE;  Surgeon: Melina Schools, MD;  Location: [facility];  Service: Orthopedics;  Laterality: N/A;  . LEFT AND RIGHT HEART CATHETERIZATION WITH CORONARY ANGIOGRAM Right 02/02/2013   Procedure: LEFT AND RIGHT HEART CATHETERIZATION WITH CORONARY ANGIOGRAM;  Surgeon: Hillary Bow, MD;  Location: [facility] CATH LAB;  Service: Cardiovascular;  Laterality: Right;  Marland Kitchen MAZE N/A 03/31/2013   Procedure: MAZE;  Surgeon: Grace Isaac, MD;  Location: [place];  Service: Open Heart Surgery;  Laterality: N/A;  . Sarcoma resection  2011    Current Outpatient Medications  Medication Sig Dispense Refill Last Dose  . acetaminophen (TYLENOL) 500 MG tablet Take 1,000 mg by mouth every 6 (six) hours as needed (pain.).     Marland Kitchen acetaminophen-codeine (TYLENOL #3) 300-30 MG tablet Take 1 tablet by mouth at bedtime.     . ALPRAZolam (XANAX) 0.5 MG tablet TAKE 1 TABLET BY MOUTH EVERY DAY (Patient taking differently: Take 0.5 mg by mouth at bedtime. ) 90 tablet 3 Taking  . amiodarone (PACERONE) 200 MG tablet TAKE 1 TABLET BY MOUTH TWICE A DAY (Patient taking differently: Take 200 mg by mouth daily. ) 180 tablet 1 Taking  . Ascorbic Acid (VITAMIN C PO) Take 1 tablet by mouth daily as needed (FOR IMMUNE SYSTEM BOOST).    Taking  . Calcium Carbonate-Vitamin D (CALCIUM + D PO) Take 1 tablet by mouth daily. CALCIBOOST   Taking  . Cholecalciferol (VITAMIN D3) 2000 units capsule Take 2,000 Units by mouth daily.   Taking  . colestipol (COLESTID) 1 g tablet Take 2 g by mouth every other day. In the morning     . CRANBERRY PO Take 1 tablet by mouth 2 (two) times daily as needed (FOR BLADDER/URINARY ISSUES.).    Taking  . ELIQUIS 2.5 MG TABS tablet TAKE 1 TABLET TWICE A DAY (Patient taking differently: Take 2.5 mg by mouth 2 (two) times a  day. ) 180 tablet 3   . furosemide (LASIX) 40 MG tablet TAKE 1 TABLET BY MOUTH TWICE DAILY FOR 2 DAYS THEN TAKE 1 TABLET BY MOUTH EVERY DAY (Patient taking differently: Take 40 mg by mouth daily. ) 90 tablet 0   . lactase (LACTAID) 3000 UNITS tablet Take 1 tablet by mouth daily as needed (only when eating dairy).   Taking  . levothyroxine (SYNTHROID, LEVOTHROID) 75 MCG tablet Take 75 mcg by mouth at bedtime.    Taking  . Loperamide HCl (LOPERAMIDE A-D PO) Take 1 tablet by mouth 4 (four) times daily as needed (diarrhea/loose stools.).    Taking  . Multiple Vitamin (MULTIVITAMIN WITH MINERALS) TABS Take 1 tablet by mouth daily.   Taking  . polyethylene glycol (MIRALAX / GLYCOLAX) packet Take 17 g by mouth daily. 14 each 0 Taking  . potassium chloride SA (K-DUR,KLOR-CON) 20 MEQ  tablet TAKE 1 TABLET BY MOUTH EVERY DAY (Patient taking differently: Take 20 mEq by mouth daily at 12 noon. ) 90 tablet 0 Taking  . pravastatin (PRAVACHOL) 40 MG tablet TAKE 1 TABLET BY MOUTH EVERY EVENING (Patient taking differently: Take 40 mg by mouth at bedtime. ) 90 tablet 3 Taking  . Probiotic Product (PROBIOTIC PO) Take 1 capsule by mouth daily as needed (FOR DIGESTIVE HEALTH.).   Taking  . rOPINIRole (REQUIP) 0.25 MG tablet Take 0.25 mg by mouth at bedtime.    Taking   No current facility-administered medications for this visit.    Allergies  Allergen Reactions  . Alendronate Sodium Nausea And Vomiting  . Ambien [Zolpidem Tartrate] Other (See Comments)    hallucinations  . [place] Nausea Only    Drinks soy milk  . Lyrica [Pregabalin] Nausea And Vomiting  . Milk-Related Compounds Diarrhea    Pt can have foods that have milk in it, she just can't drink milk  . Neurontin [Gabapentin] Nausea And Vomiting  . Procaine Hcl Other (See Comments)    Reaction unknown  . Toprol Xl [Metoprolol Succinate] Other (See Comments)    Does not remember. Tolerates Lopressor 04/10/13, Remigio Eisenmenger    Social History   Tobacco Use  .  Smoking status: Never Smoker  . Smokeless tobacco: Never Used  Substance Use Topics  . Alcohol use: No    Family History  Problem Relation Age of Onset  . Heart disease Mother   . Stroke Mother   . Arthritis Father   . Cancer Father   . Anesthesia problems Brother   . Hypertension Sister     Review of Systems As stated in HPI  Objective:   Patient is an 85 year old female.  Clinical exam: Kayla Chambers returns today for follow-up of her thoraco-/lumbar MRI.  Cardio: Regular rate and rhythm, no rubs murmurs or gallops  Pulmonary: Lungs clear to auscultation bilaterally  Abdomen: Bowel sounds 4, soft, nontender, nondistended, no hepatosplenomegaly.  Peripheral pulses: 1+ dorsalis pedis/posterior tibialis pulses bilaterally. Compartment soft and nontender.  Gait pattern: Significant limited gait due to severe lower thoracic pain.  Assistive devices: Wheelchair  Neuro: No focal neurological deficits on motor exam the lower extremity. Intermittent dysesthesias bilaterally in the lower extremity. Negative Babinski test, 1+ deep tendon reflexes at the knee and Achilles.  Musculoskeletal: Significant pain at the lower thoracic region. Pain is worse with palpation or attempts at range of motion. No significant lumbar spine pain with palpation.  X-rays of the thoracic spine demonstrate a T11 compression fracture. When evaluating the MRI from 2019 it did not appear to be there.  Lumbar MRI: completed on 04/29/19 was reviewed with the patient. I have also reviewed the radiology report. Severe spinal stenosis at L2-3 due to retropulsed fragments from prior L2 burst fracture. This is similar to a previous study. Recent fracture of T11 with a proximally 30% loss of height. No retropulsion. Positive edema within the vertebral body. Fracture it was not seen on previous study of 08/05/18. Stable multilevel degenerative changes in the lumbar spine.  Assessment:    Kayla Chambers is a pleasant 85 year old  who had a previous 2 level kyphoplasty and lumbar spine for compression fractures. She is now complaining of severe back buttock and bilateral lower extremity pain. Clinical symptoms are suggestive of spinal stenosis with neurogenic claudication but she also notes that she has had previous surgery on the left lower extremity and this is adding to her pain. Essentially she feels as though  the primary source of her pain is the mid back pain that is debilitating. It is difficult to get an accurate neurological exam due to her pain and inability to ambulate but on clinical exam she does not have signs of myelopathy. We have had a long discussion about surgical intervention and she would like to move forward with a T11 kyphoplasty. I do believe that this is reasonable given the fact that this is an acute fracture.  Plan:    We will move forward with a T11 kyphoplasty at [facility] on 05/14/2019  I have gone over the procedure as well as the risks and benefits with the patient and her husband and all of their questions were encouraged and addressed. Risks of surgery include: Infection, bleeding, death, stroke, paralysis, nerve damage, leak of cement, need for additional surgery including open decompression. Ongoing or worse pain.  Goals of surgery: Reduction in pain, and improvement in quality of life  We have also discussed the post-operative recovery period to include: bathing/showering restrictions, wound healing, activity (and driving) restrictions, medications/pain mangement.  We have also discussed post-operative redflags to include: signs and symptoms of postoperative infection, DVT/PE.  Patient and her husband expressed understanding of all this.  Follow-up: 2 weeks status post surgery.

## 2019-05-11 ENCOUNTER — Other Ambulatory Visit (HOSPITAL_COMMUNITY)
Admission: RE | Admit: 2019-05-11 | Discharge: 2019-05-11 | Disposition: A | Discharge status: Home / Self Care | Payer: Medicare Other | Source: Ambulatory Visit | Attending: Orthopedic Surgery | Admitting: Orthopedic Surgery

## 2019-05-11 DIAGNOSIS — Z1159 Encounter for screening for other viral diseases: Secondary | ICD-10-CM | POA: Diagnosis not present

## 2019-05-11 LAB — SARS CORONAVIRUS 2 (TAT 6-24 HRS): SARS Coronavirus 2: NEGATIVE

## 2019-05-12 ENCOUNTER — Other Ambulatory Visit: Payer: Self-pay | Admitting: Cardiology

## 2019-05-12 DIAGNOSIS — I480 Paroxysmal atrial fibrillation: Secondary | ICD-10-CM | POA: Diagnosis not present

## 2019-05-12 DIAGNOSIS — I5032 Chronic diastolic (congestive) heart failure: Secondary | ICD-10-CM | POA: Diagnosis not present

## 2019-05-12 DIAGNOSIS — E039 Hypothyroidism, unspecified: Secondary | ICD-10-CM | POA: Diagnosis not present

## 2019-05-12 DIAGNOSIS — I10 Essential (primary) hypertension: Secondary | ICD-10-CM | POA: Diagnosis not present

## 2019-05-12 DIAGNOSIS — I110 Hypertensive heart disease with heart failure: Secondary | ICD-10-CM | POA: Diagnosis not present

## 2019-05-12 DIAGNOSIS — M810 Age-related osteoporosis without current pathological fracture: Secondary | ICD-10-CM | POA: Diagnosis not present

## 2019-05-12 DIAGNOSIS — I2510 Atherosclerotic heart disease of native coronary artery without angina pectoris: Secondary | ICD-10-CM | POA: Diagnosis not present

## 2019-05-13 ENCOUNTER — Other Ambulatory Visit: Payer: Self-pay

## 2019-05-13 ENCOUNTER — Encounter (HOSPITAL_COMMUNITY): Payer: Self-pay | Admitting: *Deleted

## 2019-05-13 NOTE — Anesthesia Preprocedure Evaluation (Addendum)
Anesthesia Evaluation  Patient identified by MRN, date of birth, ID band Patient awake    Reviewed: Allergy & Precautions, NPO status , Patient's Chart, lab work & pertinent test results  Airway Mallampati: II  TM Distance: >3 FB Neck ROM: Full    Dental no notable dental hx.    Pulmonary neg pulmonary ROS,    Pulmonary exam normal breath sounds clear to auscultation       Cardiovascular hypertension, + CAD and +CHF  Normal cardiovascular exam+ dysrhythmias Atrial Fibrillation  Rhythm:Regular Rate:Normal  ECG: rate 68. Normal sinus rhythm Left axis deviation Pulmonary disease pattern Incomplete right bundle branch block  [name], MD is cardiologist. Last tele-visit 02/20/19.   Neuro/Psych  Headaches, PSYCHIATRIC DISORDERS Anxiety Dementia Peripheral neuropathy Vertigo  Neuromuscular disease    GI/Hepatic negative GI ROS, Neg liver ROS,   Endo/Other  Hypothyroidism   Renal/GU negative Renal ROS     Musculoskeletal Back pain   Abdominal   Peds  Hematology HLD   Anesthesia Other Findings T11 Compression Fracture  Reproductive/Obstetrics                             Anesthesia Physical Anesthesia Plan  ASA: III  Anesthesia Plan: MAC   Post-op Pain Management:    Induction: Intravenous  PONV Risk Score and Plan: 2 and Ondansetron, Dexamethasone and Treatment may vary due to age or medical condition  Airway Management Planned: Natural Airway  Additional Equipment:   Intra-op Plan:   Post-operative Plan:   Informed Consent: I have reviewed the patients History and Physical, chart, labs and discussed the procedure including the risks, benefits and alternatives for the proposed anesthesia with the patient or authorized representative who has indicated his/her understanding and acceptance.     Dental advisory given  Plan Discussed with: CRNA  Anesthesia Plan Comments:  (Reviewed PAT note written 05/13/2019 by Myra Gianotti, PA-C. SAME DAY WORK-UP    )      Anesthesia Quick Evaluation

## 2019-05-13 NOTE — Progress Notes (Signed)
SDW-pre-op call completed by both pt and spouse, Kayla Chambers. Pt denies having SOB and chest pain. Spouse stated that pt is under the care of Dr. Stanford Breed, Cardiology. Spouse denies that pt had a stress test. Spouse denies that pt had an EKG in the last year. Spouse denies recent labs. Spouse stated that pt last dose of Eliquis was " Sunday :" as instructed. Spouse made aware to have pt stop taking vitamins, fish oil and herbal medications. Do not take any NSAIDs ie: Ibuprofen, Advil, Naproxen (Aleve), Motrin, BC and Goody Powder.   Spouse denies that pt and family tested positive for COVID-19.  Spouse denies that he and pt experienced the following symptoms:  Cough yes/no: No Fever (>100.37F)  yes/no: No Runny nose yes/no: No Sore throat yes/no: No Difficulty breathing/shortness of breath  yes/no: No  Have you or a family member traveled in the last 14 days and where? yes/no: No  Spouse reminded that hospital visitation restrictions are in effect and the importance of the restrictions.  Spouse verbalized understanding of all pre-op instructions.  PA, Anesthesiology, made aware of pt history; see note

## 2019-05-13 NOTE — Progress Notes (Signed)
Anesthesia Chart Review: SAME DAY WORK-UP   Case: [id number] Date/Time: 05/14/19 1115   Procedure: KYPHOPLASTY T11 (N/A ) - 90 mins   Anesthesia type: General   Pre-op diagnosis: T11 Compression Fracture   Location: [facility] OR ROOM 04 / [facility]   Surgeon: Melina Schools, MD      DISCUSSION: Patient is a 85 year old female scheduled for the above procedure. Kayla Chambers is s/p L3 kyphoplasty 05/07/18 done under MAC anesthesia. (At that time, this was the preferred option by patient and family at least partly due to concerns with memory issues).   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 Kayla Chambers had evidence of a benign hepatic hemangioma and mild cirrhotic changes on March 2020 liver MRI.   Per telephone encounter by Kathyrn Drown, NP on 05/07/19: "Given past medical history and time since last visit, based on ACC/AHA guidelines, [name] would be at acceptable risk for the planned procedure without further cardiovascular testing. Based on her last office visit with Dr. Stanford Breed, Kayla Chambers had no specific complaints of chest pain, palpitations or fluid volume overload. Kayla Chambers was tolerating her medical regimen well. Patient has a diagnosis ofafibon Eliquisfor anticoagulation."  Kayla Chambers advised that patient could hold Eliquis for three days prior to procedure. (Per my previous 2019 note, patient cannot tell when Kayla Chambers is in afib, but may not feel well when Kayla Chambers is in afib. Kayla Chambers was using a KardioMobile app then to check her rhythm as needed.)   Presurgical COVID test was negative on 05/11/19. Kayla Chambers is for updated labs and EKG and for anesthesia team evaluation on the day of surgery.    PROVIDERS: Donald Prose, MD is PCP. [name],  MD is cardiologist. Last tele-visit 02/20/19.   LABS: Kayla Chambers is for labs on arrival. In 07/2018, Cr 0.88, H/H 12.4/37.4, PLT 179, AST 22, ALT 18.    IMAGES: MRI Liver 02/03/19: IMPRESSION: 1. 14 mm segment 2 liver lesion has MR imaging characteristics of a benign hepatic hemangioma. 2. Probable mild cirrhotic changes involving the liver but no worrisome hepatic lesions, splenomegaly or ascites. 3. No acute abdominal findings.  Abdominal US 01/20/19: IMPRESSION: 1. Cirrhosis with 1.4 cm iso- to hypoechoic lesion in the left hepatic lobe, corresponding to the enhancing lesion seen on prior CT. Given underlying cirrhosis, definitive evaluation of this lesion with MRI with and without contrast is recommended to exclude hepatocellular carcinoma. 2. Unchanged 5 mm cystic lesion in the pancreatic head, potentially a small IPMN. Given the patient's age, further follow up is not recommended.  CXR 08/14/18: FINDINGS: The heart size and mediastinal contours are stable. Cardiac valvular replacement ring is unchanged. Both lungs are clear. The lungs are hyperinflated. The visualized skeletal structures are stable. IMPRESSION: No active cardiopulmonary disease.   EKG: Kayla Chambers will need an updated EKG on arrival.   EKG 04/09/18: SR, LAD, anteroseptal infarct (age indeterminate), lateral leads are also involved. No significant changes since last tracing.    CV: Echo 10/18/17: Study Conclusions - Left ventricle: The cavity size was normal. Wall thickness was increased in a pattern of mild LVH. Systolic function was normal. The estimated ejection fraction was in the range of 60% to 65%. Wall motion was normal; there were no regional wall motion abnormalities. - Ventricular septum: The contour showed  diastolic flattening and systolic flattening. - Aortic valve: A bioprosthesis was present. - Aortic root: The aortic root was mildly dilated. - Mitral valve: Calcified annulus. Mildly  thickened leaflets . There was mild regurgitation. - Left atrium: The atrium was mildly dilated. - Right atrium: The atrium was severely dilated. - Atrial septum: The septum bowed from right to left, consistent with increased right atrial pressure. - Tricuspid valve: There was severe regurgitation. Impressions: - Normal LV systolic function; mild LVH; s/p AVR with normal mean gradient of 6 mmHg; mildly dilated aortic root; mild MR; mild LAE; severe RAE; severe TR.  Carotid U/S 02/02/16: IMPRESSIONS: Heterogeneous plaque, bilaterally.  1 to 39% bilateral ICA stenosis.  Normal subclavian arteries, bilaterally.  Patent vertebral arteries with antegrade flow.  Cardiac cath 02/02/13: Coronary angiography: Coronary dominance: right - Left mainstem: Short and without obstruction - Left anterior descending (LAD): Moderate calcification with about 30% luminal irregularity proximally. No critical obstruction. The vessel terminates as an apical LAD and large distal diagonal. - Left circumflex (LCx): Consists mainly of a large OM branch. There is about 40% segmental plaquing just beyond the takeoff of the small AV circumflex.  - Right coronary artery (RCA): Large caliber vessel with 30% proximal narrowing that is eccentric. There is a large caliber PDA and smaller PDA, both free of major disease.  - Left ventriculography: There was ectopy with LV angiography, so assessment of LV function is not reliable. EF estimate would be 50%, but unreliable.  - Aortography reveals a very large aortic root down involving the sinus of valsalva. There is mild calcification of the valve. There is moderately severe aortic regurgitation (3 plus) noted.  Final Conclusions:  1. Aortic regurgitation, moderately severe 2. Dilated LV with likely reduction in LV function 3. Recent onset of atrial fibrillation 4. Moderate coronary calcification, without critical obstruction. Recommendations:  1. Defer  surgical decisions to patient with Dr Mare Ferrari. 2. Rate control. 3. Given AI and wide pulse pressure, and very small size will defer reanticoagulation to am. Would consider low dose heparin for 1-2 days before consideration a NOAC given AI and risk of bleeding.    Past Medical History:  Diagnosis Date  . Anxiety    takes xanax for sleep.   . Aortic insufficiency    a. 03/2013 s/p  AVR w/ biologic 4m Magna Ease peric tissue valve, model 300TFX, ser# [id number]   . Back pain    BACK BRACE/FRACTURE  . CAD (coronary artery disease)    a. 01/2013 mod calcification w/o sev dzs;  b. 03/2013 CABG x1 VG->RCA @ time of AVR  . Chronic diastolic CHF (congestive heart failure) ([place]    a. 01/2013 echo: EF 50-55%.  . Dementia ([place]   . Dilated [place] ([place]    a. 03/2013 s/p Bentall procedure with Ao Root replacement (261mValsalva graft) w/ reimplantationof the R and L coronary ostium.  . Marland Kitchenysrhythmia    A. FIB  . Fibromyalgia   . Headache(784.0)    hx migraines  . Hypothyroidism   . Intracranial aneurysm    in late 1970's  . 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 Neoadjuvant RT by Dr. McYisroel RammingSurveillance plan: MRI 12/08/11 shows changes in presumed post-op seroma/hematoma with slight increase in size - follow up MRI 11/18/12 showed same size. Will continue with q3 month MRI. CT C/A/P annually alternating with CXR at 6 months.   .Marland Kitchen  Osteoporosis   . Paroxysmal A-fib ([place])    a. Dx 01/2013 - placed on xarelto;  b. 03/2013 s/p left sided MAZE and LA clip @ time of AVR/CABG.  . Peripheral neuropathy   . PMR (polymyalgia rheumatica) (HCC)    Inactive  . Rheumatic heart disease   . 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 Vertigo     Past Surgical History:  Procedure Laterality Date  . ABDOMINAL HYSTERECTOMY    . ASCENDING AORTIC ROOT REPLACEMENT N/A  03/31/2013   Procedure: ASCENDING AORTIC ROOT REPLACEMENT;  Surgeon: Grace Isaac, MD;  Location: [facility];  Service: Open Heart Surgery;  Laterality: N/A; clip inserted.  Marland Kitchen BLADDER SUSPENSION    . CARDIOVERSION N/A 01/29/2017   Procedure: CARDIOVERSION;  Surgeon: Jerline Pain, MD;  Location: [place];  Service: Cardiovascular;  Laterality: N/A;  . CARDIOVERSION N/A 10/22/2017   Procedure: CARDIOVERSION;  Surgeon: Lelon Perla, MD;  Location: [facility] ENDOSCOPY;  Service: Cardiovascular;  Laterality: N/A;  . COLONOSCOPY  10/25/2011   Procedure: COLONOSCOPY;  Surgeon: Garlan Fair, MD;  Location: [facility] ENDOSCOPY;  Service: Endoscopy;  Laterality: N/A;  . EYE SURGERY Bilateral    cataracts  . INTRAOPERATIVE TRANSESOPHAGEAL ECHOCARDIOGRAM N/A 03/31/2013   Procedure: INTRAOPERATIVE TRANSESOPHAGEAL ECHOCARDIOGRAM;  Surgeon: Grace Isaac, MD;  Location: [place];  Service: Open Heart Surgery;  Laterality: N/A;  . KYPHOPLASTY N/A 05/07/2018   Procedure: KYPHOPLASTY LUMBAR THREE;  Surgeon: Melina Schools, MD;  Location: [place];  Service: Orthopedics;  Laterality: N/A;  . LEFT AND RIGHT HEART CATHETERIZATION WITH CORONARY ANGIOGRAM Right 02/02/2013   Procedure: LEFT AND RIGHT HEART CATHETERIZATION WITH CORONARY ANGIOGRAM;  Surgeon: Hillary Bow, MD;  Location: [facility] CATH LAB;  Service: Cardiovascular;  Laterality: Right;  Marland Kitchen MAZE N/A 03/31/2013   Procedure: MAZE;  Surgeon: Grace Isaac, MD;  Location: [place];  Service: Open Heart Surgery;  Laterality: N/A;  . Sarcoma resection  2011    MEDICATIONS: No current facility-administered medications for this encounter.    Marland Kitchen acetaminophen (TYLENOL) 500 MG tablet  . acetaminophen-codeine (TYLENOL #3) 300-30 MG tablet  . ALPRAZolam (XANAX) 0.5 MG tablet  . amiodarone (PACERONE) 200 MG tablet  . Ascorbic Acid (VITAMIN C PO)  . Calcium Carbonate-Vitamin D (CALCIUM + D PO)  . Cholecalciferol (VITAMIN D3) 2000 units capsule  . colestipol (COLESTID) 1 g  tablet  . CRANBERRY PO  . ELIQUIS 2.5 MG TABS tablet  . furosemide (LASIX) 40 MG tablet  . lactase (LACTAID) 3000 UNITS tablet  . levothyroxine (SYNTHROID, LEVOTHROID) 75 MCG tablet  . Loperamide HCl (LOPERAMIDE A-D PO)  . Multiple Vitamin (MULTIVITAMIN WITH MINERALS) TABS  . polyethylene glycol (MIRALAX / GLYCOLAX) packet  . pravastatin (PRAVACHOL) 40 MG tablet  . Probiotic Product (PROBIOTIC PO)  . rOPINIRole (REQUIP) 0.25 MG tablet  . potassium chloride SA (K-DUR) 20 MEQ tablet    Myra Gianotti, PA-C Surgical Short Stay/Anesthesiology [facility] Phone [phone] [facility] Phone [phone] 05/13/2019 12:54 PM

## 2019-05-14 ENCOUNTER — Ambulatory Visit (HOSPITAL_COMMUNITY): Payer: Medicare Other

## 2019-05-14 ENCOUNTER — Observation Stay (HOSPITAL_COMMUNITY): Payer: Medicare Other

## 2019-05-14 ENCOUNTER — Ambulatory Visit (HOSPITAL_COMMUNITY): Payer: Medicare Other | Admitting: Vascular Surgery

## 2019-05-14 ENCOUNTER — Observation Stay (HOSPITAL_COMMUNITY)
Admission: RE | Admit: 2019-05-14 | Discharge: 2019-05-15 | Disposition: A | Discharge status: Home / Self Care | Payer: Medicare Other | Attending: Orthopedic Surgery | Admitting: Orthopedic Surgery

## 2019-05-14 ENCOUNTER — Encounter (HOSPITAL_COMMUNITY): Payer: Self-pay | Admitting: *Deleted

## 2019-05-14 ENCOUNTER — Other Ambulatory Visit: Payer: Self-pay

## 2019-05-14 ENCOUNTER — Encounter (HOSPITAL_COMMUNITY)
Admission: RE | Disposition: A | Discharge status: Home / Self Care | Payer: Self-pay | Source: Home / Self Care | Attending: Orthopedic Surgery

## 2019-05-14 DIAGNOSIS — Z954 Presence of other heart-valve replacement: Secondary | ICD-10-CM | POA: Insufficient documentation

## 2019-05-14 DIAGNOSIS — M353 Polymyalgia rheumatica: Secondary | ICD-10-CM | POA: Diagnosis not present

## 2019-05-14 DIAGNOSIS — Z79899 Other long term (current) drug therapy: Secondary | ICD-10-CM | POA: Insufficient documentation

## 2019-05-14 DIAGNOSIS — I480 Paroxysmal atrial fibrillation: Secondary | ICD-10-CM

## 2019-05-14 DIAGNOSIS — F0390 Unspecified dementia without behavioral disturbance: Secondary | ICD-10-CM | POA: Insufficient documentation

## 2019-05-14 DIAGNOSIS — G629 Polyneuropathy, unspecified: Secondary | ICD-10-CM | POA: Diagnosis not present

## 2019-05-14 DIAGNOSIS — M8088XA Other osteoporosis with current pathological fracture, vertebra(e), initial encounter for fracture: Principal | ICD-10-CM | POA: Insufficient documentation

## 2019-05-14 DIAGNOSIS — Z01818 Encounter for other preprocedural examination: Secondary | ICD-10-CM

## 2019-05-14 DIAGNOSIS — Z7901 Long term (current) use of anticoagulants: Secondary | ICD-10-CM | POA: Diagnosis not present

## 2019-05-14 DIAGNOSIS — Z951 Presence of aortocoronary bypass graft: Secondary | ICD-10-CM | POA: Diagnosis not present

## 2019-05-14 DIAGNOSIS — Z923 Personal history of irradiation: Secondary | ICD-10-CM | POA: Insufficient documentation

## 2019-05-14 DIAGNOSIS — I5033 Acute on chronic diastolic (congestive) heart failure: Secondary | ICD-10-CM | POA: Diagnosis not present

## 2019-05-14 DIAGNOSIS — F419 Anxiety disorder, unspecified: Secondary | ICD-10-CM | POA: Diagnosis not present

## 2019-05-14 DIAGNOSIS — E039 Hypothyroidism, unspecified: Secondary | ICD-10-CM | POA: Insufficient documentation

## 2019-05-14 DIAGNOSIS — I5032 Chronic diastolic (congestive) heart failure: Secondary | ICD-10-CM | POA: Insufficient documentation

## 2019-05-14 DIAGNOSIS — Z85831 Personal history of malignant neoplasm of soft tissue: Secondary | ICD-10-CM | POA: Insufficient documentation

## 2019-05-14 DIAGNOSIS — Z79890 Hormone replacement therapy (postmenopausal): Secondary | ICD-10-CM | POA: Diagnosis not present

## 2019-05-14 DIAGNOSIS — I110 Hypertensive heart disease with heart failure: Secondary | ICD-10-CM | POA: Insufficient documentation

## 2019-05-14 DIAGNOSIS — M797 Fibromyalgia: Secondary | ICD-10-CM | POA: Insufficient documentation

## 2019-05-14 DIAGNOSIS — I2510 Atherosclerotic heart disease of native coronary artery without angina pectoris: Secondary | ICD-10-CM | POA: Insufficient documentation

## 2019-05-14 DIAGNOSIS — Z981 Arthrodesis status: Secondary | ICD-10-CM | POA: Diagnosis not present

## 2019-05-14 DIAGNOSIS — S22080A Wedge compression fracture of T11-T12 vertebra, initial encounter for closed fracture: Secondary | ICD-10-CM | POA: Diagnosis not present

## 2019-05-14 DIAGNOSIS — Z419 Encounter for procedure for purposes other than remedying health state, unspecified: Secondary | ICD-10-CM

## 2019-05-14 DIAGNOSIS — S22000A Wedge compression fracture of unspecified thoracic vertebra, initial encounter for closed fracture: Secondary | ICD-10-CM | POA: Diagnosis present

## 2019-05-14 HISTORY — PX: KYPHOPLASTY: SHX5884

## 2019-05-14 HISTORY — DX: Wedge compression fracture of unspecified thoracic vertebra, initial encounter for closed fracture: S22.000A

## 2019-05-14 LAB — COMPREHENSIVE METABOLIC PANEL
ALT: 17 U/L (ref 0–44)
AST: 23 U/L (ref 15–41)
Albumin: 3.7 g/dL (ref 3.5–5.0)
Alkaline Phosphatase: 102 U/L (ref 38–126)
Anion gap: 13 (ref 5–15)
BUN: 9 mg/dL (ref 8–23)
CO2: 23 mmol/L (ref 22–32)
Calcium: 9.3 mg/dL (ref 8.9–10.3)
Chloride: 100 mmol/L (ref 98–111)
Creatinine, Ser: 0.92 mg/dL (ref 0.44–1.00)
GFR calc Af Amer: >60 mL/min (ref >60)
GFR calc non Af Amer: 55 mL/min — ABNORMAL LOW (ref >60)
Glucose, Bld: 93 mg/dL (ref 70–99)
Potassium: 4.2 mmol/L (ref 3.5–5.1)
Sodium: 136 mmol/L (ref 135–145)
Total Bilirubin: 0.7 mg/dL (ref 0.3–1.2)
Total Protein: 6.6 g/dL (ref 6.5–8.1)

## 2019-05-14 LAB — CBC
HCT: 42.3 % (ref 36.0–46.0)
Hemoglobin: 14.2 g/dL (ref 12.0–15.0)
MCH: 32.9 pg (ref 26.0–34.0)
MCHC: 33.6 g/dL (ref 30.0–36.0)
MCV: 97.9 fL (ref 80.0–100.0)
Platelets: 213 10*3/uL (ref 150–400)
RBC: 4.32 MIL/uL (ref 3.87–5.11)
RDW: 14.2 % (ref 11.5–15.5)
WBC: 5.9 10*3/uL (ref 4.0–10.5)
nRBC: 0 % (ref 0.0–0.2)

## 2019-05-14 LAB — PROTIME-INR
INR: 1 (ref 0.8–1.2)
Prothrombin Time: 13.3 seconds (ref 11.4–15.2)

## 2019-05-14 LAB — APTT: aPTT: 31 seconds (ref 24–36)

## 2019-05-14 SURGERY — KYPHOPLASTY
Anesthesia: Monitor Anesthesia Care | Site: Back

## 2019-05-14 MED ORDER — CEFAZOLIN SODIUM-DEXTROSE 2-4 GM/100ML-% IV SOLN
INTRAVENOUS | Status: AC
  Filled 2019-05-14: qty 100

## 2019-05-14 MED ORDER — IOPAMIDOL (ISOVUE-300) INJECTION 61%
INTRAVENOUS | Status: DC | PRN
  Administered 2019-05-14: 5 mL

## 2019-05-14 MED ORDER — PROPOFOL 500 MG/50ML IV EMUL
INTRAVENOUS | Status: DC | PRN
  Administered 2019-05-14: 50 ug/kg/min via INTRAVENOUS

## 2019-05-14 MED ORDER — BUPIVACAINE-EPINEPHRINE (PF) 0.25% -1:200000 IJ SOLN
INTRAMUSCULAR | Status: AC
  Filled 2019-05-14: qty 30

## 2019-05-14 MED ORDER — ONDANSETRON HCL 4 MG PO TABS: 4.0000 mg | ORAL_TABLET | Freq: Four times a day (QID) | ORAL | Status: DC | PRN

## 2019-05-14 MED ORDER — METHOCARBAMOL 1000 MG/10ML IJ SOLN
500.0000 mg | Freq: Four times a day (QID) | INTRAVENOUS | Status: DC | PRN
  Filled 2019-05-14: qty 5

## 2019-05-14 MED ORDER — DEXAMETHASONE SODIUM PHOSPHATE 10 MG/ML IJ SOLN
INTRAMUSCULAR | Status: AC
  Filled 2019-05-14: qty 1

## 2019-05-14 MED ORDER — CEFAZOLIN SODIUM-DEXTROSE 2-4 GM/100ML-% IV SOLN
2.0000 g | Freq: Three times a day (TID) | INTRAVENOUS | Status: AC
  Administered 2019-05-14 – 2019-05-15 (×2): 2 g via INTRAVENOUS
  Filled 2019-05-14 (×2): qty 100

## 2019-05-14 MED ORDER — MENTHOL 3 MG MT LOZG: 1.0000 | LOZENGE | OROMUCOSAL | Status: DC | PRN

## 2019-05-14 MED ORDER — ONDANSETRON HCL 4 MG/2ML IJ SOLN
INTRAMUSCULAR | Status: AC
  Filled 2019-05-14: qty 2

## 2019-05-14 MED ORDER — CEFAZOLIN SODIUM-DEXTROSE 2-4 GM/100ML-% IV SOLN: 2.0000 g | Freq: Three times a day (TID) | INTRAVENOUS | Status: DC

## 2019-05-14 MED ORDER — ACETAMINOPHEN 325 MG PO TABS
650.0000 mg | ORAL_TABLET | ORAL | Status: DC | PRN
  Administered 2019-05-14 – 2019-05-15 (×2): 650 mg via ORAL
  Filled 2019-05-14 (×2): qty 2

## 2019-05-14 MED ORDER — ONDANSETRON HCL 4 MG PO TABS: 4.0000 mg | ORAL_TABLET | Freq: Three times a day (TID) | ORAL | 0 refills | Status: AC | PRN

## 2019-05-14 MED ORDER — LIDOCAINE 2% (20 MG/ML) 5 ML SYRINGE
INTRAMUSCULAR | Status: AC
  Filled 2019-05-14: qty 5

## 2019-05-14 MED ORDER — FENTANYL CITRATE (PF) 100 MCG/2ML IJ SOLN
INTRAMUSCULAR | Status: DC | PRN
  Administered 2019-05-14: 50 ug via INTRAVENOUS

## 2019-05-14 MED ORDER — PROPOFOL 10 MG/ML IV BOLUS
INTRAVENOUS | Status: AC
  Filled 2019-05-14: qty 20

## 2019-05-14 MED ORDER — METHOCARBAMOL 500 MG PO TABS
500.0000 mg | ORAL_TABLET | Freq: Four times a day (QID) | ORAL | Status: DC | PRN
  Administered 2019-05-14 – 2019-05-15 (×2): 500 mg via ORAL
  Filled 2019-05-14 (×2): qty 1

## 2019-05-14 MED ORDER — PHENOL 1.4 % MT LIQD: 1.0000 | OROMUCOSAL | Status: DC | PRN

## 2019-05-14 MED ORDER — CEFAZOLIN SODIUM-DEXTROSE 2-4 GM/100ML-% IV SOLN
2.0000 g | INTRAVENOUS | Status: AC
  Administered 2019-05-14: 2 g via INTRAVENOUS

## 2019-05-14 MED ORDER — LEVOTHYROXINE SODIUM 75 MCG PO TABS
75.0000 ug | ORAL_TABLET | Freq: Every day | ORAL | Status: DC
  Administered 2019-05-14: 75 ug via ORAL
  Filled 2019-05-14: qty 1

## 2019-05-14 MED ORDER — ACETAMINOPHEN 325 MG PO TABS: 650.0000 mg | ORAL_TABLET | ORAL | Status: DC | PRN

## 2019-05-14 MED ORDER — SODIUM CHLORIDE 0.9% FLUSH: 3.0000 mL | INTRAVENOUS | Status: DC | PRN

## 2019-05-14 MED ORDER — POLYETHYLENE GLYCOL 3350 17 G PO PACK: 17.0000 g | PACK | Freq: Every day | ORAL | Status: DC | PRN

## 2019-05-14 MED ORDER — ACETAMINOPHEN 650 MG RE SUPP: 650.0000 mg | RECTAL | Status: DC | PRN

## 2019-05-14 MED ORDER — LACTATED RINGERS IV SOLN: INTRAVENOUS | Status: DC

## 2019-05-14 MED ORDER — ONDANSETRON HCL 4 MG/2ML IJ SOLN: 4.0000 mg | Freq: Once | INTRAMUSCULAR | Status: DC | PRN

## 2019-05-14 MED ORDER — ACETAMINOPHEN 500 MG PO TABS
ORAL_TABLET | ORAL | Status: AC
  Administered 2019-05-14: 1000 mg via ORAL
  Filled 2019-05-14: qty 2

## 2019-05-14 MED ORDER — BUPIVACAINE LIPOSOME 1.3 % IJ SUSP
INTRAMUSCULAR | Status: DC | PRN
  Administered 2019-05-14: 10 mL

## 2019-05-14 MED ORDER — ACETAMINOPHEN 500 MG PO TABS
1000.0000 mg | ORAL_TABLET | Freq: Once | ORAL | Status: AC
  Administered 2019-05-14: 1000 mg via ORAL

## 2019-05-14 MED ORDER — METHOCARBAMOL 500 MG PO TABS: 500.0000 mg | ORAL_TABLET | Freq: Four times a day (QID) | ORAL | Status: DC | PRN

## 2019-05-14 MED ORDER — FUROSEMIDE 40 MG PO TABS: 40.0000 mg | ORAL_TABLET | Freq: Every day | ORAL | Status: DC

## 2019-05-14 MED ORDER — ONDANSETRON HCL 4 MG/2ML IJ SOLN: 4.0000 mg | Freq: Four times a day (QID) | INTRAMUSCULAR | Status: DC | PRN

## 2019-05-14 MED ORDER — LACTATED RINGERS IV SOLN
INTRAVENOUS | Status: DC
  Administered 2019-05-14: 1000 mL via INTRAVENOUS

## 2019-05-14 MED ORDER — METHOCARBAMOL 500 MG PO TABS: 500.0000 mg | ORAL_TABLET | Freq: Three times a day (TID) | ORAL | 0 refills | Status: AC | PRN

## 2019-05-14 MED ORDER — ACETAMINOPHEN 500 MG PO TABS: 1000.0000 mg | ORAL_TABLET | Freq: Four times a day (QID) | ORAL | 0 refills | Status: DC | PRN

## 2019-05-14 MED ORDER — ONDANSETRON HCL 4 MG/2ML IJ SOLN
INTRAMUSCULAR | Status: DC | PRN
  Administered 2019-05-14: 4 mg via INTRAVENOUS

## 2019-05-14 MED ORDER — SODIUM CHLORIDE 0.9% FLUSH: 3.0000 mL | Freq: Two times a day (BID) | INTRAVENOUS | Status: DC

## 2019-05-14 MED ORDER — FENTANYL CITRATE (PF) 100 MCG/2ML IJ SOLN
25.0000 ug | INTRAMUSCULAR | Status: DC | PRN
  Administered 2019-05-14: 50 ug via INTRAVENOUS

## 2019-05-14 MED ORDER — BUPIVACAINE LIPOSOME 1.3 % IJ SUSP
20.0000 mL | Freq: Once | INTRAMUSCULAR | Status: DC
  Filled 2019-05-14: qty 20

## 2019-05-14 MED ORDER — BUPIVACAINE-EPINEPHRINE 0.25% -1:200000 IJ SOLN
INTRAMUSCULAR | Status: DC | PRN
  Administered 2019-05-14: 8 mL

## 2019-05-14 MED ORDER — ACETAMINOPHEN-CODEINE #3 300-30 MG PO TABS: 1.0000 | ORAL_TABLET | Freq: Four times a day (QID) | ORAL | 0 refills | Status: DC | PRN

## 2019-05-14 MED ORDER — ROPINIROLE HCL 0.25 MG PO TABS
0.2500 mg | ORAL_TABLET | Freq: Every day | ORAL | Status: DC
  Administered 2019-05-14: 0.25 mg via ORAL
  Filled 2019-05-14: qty 1

## 2019-05-14 MED ORDER — FENTANYL CITRATE (PF) 100 MCG/2ML IJ SOLN
INTRAMUSCULAR | Status: AC
  Filled 2019-05-14: qty 2

## 2019-05-14 MED ORDER — 0.9 % SODIUM CHLORIDE (POUR BTL) OPTIME
TOPICAL | Status: DC | PRN
  Administered 2019-05-14: 1000 mL

## 2019-05-14 MED ORDER — DEXMEDETOMIDINE HCL IN NACL 200 MCG/50ML IV SOLN
INTRAVENOUS | Status: DC | PRN
  Administered 2019-05-14: 8 ug via INTRAVENOUS
  Administered 2019-05-14: 4 ug via INTRAVENOUS

## 2019-05-14 MED ORDER — FENTANYL CITRATE (PF) 250 MCG/5ML IJ SOLN
INTRAMUSCULAR | Status: AC
  Filled 2019-05-14: qty 5

## 2019-05-14 MED ORDER — PRAVASTATIN SODIUM 40 MG PO TABS
40.0000 mg | ORAL_TABLET | Freq: Every day | ORAL | Status: DC
  Administered 2019-05-14: 40 mg via ORAL
  Filled 2019-05-14: qty 1

## 2019-05-14 MED ORDER — BUPIVACAINE-EPINEPHRINE 0.25% -1:200000 IJ SOLN
INTRAMUSCULAR | Status: DC | PRN
  Administered 2019-05-14: 10 mL

## 2019-05-14 MED ORDER — ACETAMINOPHEN-CODEINE #3 300-30 MG PO TABS
1.0000 | ORAL_TABLET | Freq: Every day | ORAL | Status: DC
  Administered 2019-05-14: 1 via ORAL
  Filled 2019-05-14 (×2): qty 1

## 2019-05-14 MED ORDER — AMIODARONE HCL 200 MG PO TABS
200.0000 mg | ORAL_TABLET | Freq: Every day | ORAL | Status: DC
  Filled 2019-05-14: qty 1

## 2019-05-14 SURGICAL SUPPLY — 48 items
ADH SKN CLS APL DERMABOND .7 (GAUZE/BANDAGES/DRESSINGS) ×1
BANDAGE ADH SHEER 1  50/CT (GAUZE/BANDAGES/DRESSINGS) ×6 IMPLANT
BLADE SURG 15 STRL LF DISP TIS (BLADE) ×1 IMPLANT
BLADE SURG 15 STRL SS (BLADE) ×3
BONE FILLER DEVICE STRL SZ3 (INSTRUMENTS) IMPLANT
CEMENT BONE KYPHX HV R (Orthopedic Implant) ×2 IMPLANT
CEMENT KYPHON C01A KIT/MIXER (Cement) ×2 IMPLANT
COVER LIGHT HANDLE  DEROYL (MISCELLANEOUS) ×3 IMPLANT
COVER WAND RF STERILE (DRAPES) ×3 IMPLANT
CURETTE EXPRESS SZ2 7MM (INSTRUMENTS) IMPLANT
CURETTE WEDGE 8.5MM KYPHX (MISCELLANEOUS) IMPLANT
CURRETTE EXPRESS SZ2 7MM (INSTRUMENTS)
DERMABOND ADVANCED (GAUZE/BANDAGES/DRESSINGS) ×2
DERMABOND ADVANCED .7 DNX12 (GAUZE/BANDAGES/DRESSINGS) ×1 IMPLANT
DRAPE C-ARM 42X72 X-RAY (DRAPES) ×6 IMPLANT
DRAPE INCISE IOBAN 66X45 STRL (DRAPES) ×3 IMPLANT
DRAPE LAPAROTOMY T 102X78X121 (DRAPES) ×3 IMPLANT
DRAPE SURG 17X23 STRL (DRAPES) ×3 IMPLANT
DRAPE U-SHAPE 47X51 STRL (DRAPES) ×3 IMPLANT
DRAPE WARM FLUID 44X44 (DRAPES) ×3 IMPLANT
DURAPREP 26ML APPLICATOR (WOUND CARE) ×3 IMPLANT
GLOVE BIO SURGEON STRL SZ 6.5 (GLOVE) ×2 IMPLANT
GLOVE BIO SURGEONS STRL SZ 6.5 (GLOVE) ×1
GLOVE BIOGEL PI IND STRL 6.5 (GLOVE) ×1 IMPLANT
GLOVE BIOGEL PI IND STRL 8.5 (GLOVE) ×1 IMPLANT
GLOVE BIOGEL PI INDICATOR 6.5 (GLOVE) ×2
GLOVE BIOGEL PI INDICATOR 8.5 (GLOVE) ×2
GLOVE SS BIOGEL STRL SZ 8.5 (GLOVE) ×1 IMPLANT
GLOVE SUPERSENSE BIOGEL SZ 8.5 (GLOVE) ×2
GOWN STRL REUS W/ TWL LRG LVL3 (GOWN DISPOSABLE) ×2 IMPLANT
GOWN STRL REUS W/TWL 2XL LVL3 (GOWN DISPOSABLE) ×3 IMPLANT
GOWN STRL REUS W/TWL LRG LVL3 (GOWN DISPOSABLE) ×6
KIT BASIN OR (CUSTOM PROCEDURE TRAY) ×3 IMPLANT
KIT TURNOVER KIT B (KITS) ×3 IMPLANT
NDL SPNL 18GX3.5 QUINCKE PK (NEEDLE) ×2 IMPLANT
NEEDLE 21X1 OR PACK (NEEDLE) ×3 IMPLANT
NEEDLE SPNL 18GX3.5 QUINCKE PK (NEEDLE) ×6 IMPLANT
NS IRRIG 1000ML POUR BTL (IV SOLUTION) ×3 IMPLANT
PACK SURGICAL SETUP 50X90 (CUSTOM PROCEDURE TRAY) ×3 IMPLANT
PACK UNIVERSAL I (CUSTOM PROCEDURE TRAY) ×3 IMPLANT
PAD ARMBOARD 7.5X6 YLW CONV (MISCELLANEOUS) ×6 IMPLANT
SPONGE LAP 4X18 RFD (DISPOSABLE) ×3 IMPLANT
SUT MNCRL AB 3-0 PS2 18 (SUTURE) ×3 IMPLANT
SYR CONTROL 10ML LL (SYRINGE) ×3 IMPLANT
TOWEL OR 17X26 10 PK STRL BLUE (TOWEL DISPOSABLE) ×3 IMPLANT
TRAY KYPHOPAK 15/3 ONESTEP 1ST (MISCELLANEOUS) ×2 IMPLANT
TRAY KYPHOPAK 20/3 ONESTEP 1ST (MISCELLANEOUS) IMPLANT
WATER STERILE IRR 1000ML POUR (IV SOLUTION) ×3 IMPLANT

## 2019-05-14 NOTE — Anesthesia Postprocedure Evaluation (Signed)
Anesthesia Post Note  Patient: Kayla Chambers  Procedure(s) Performed: KYPHOPLASTY T11 (N/A Back)     Patient location during evaluation: PACU Anesthesia Type: MAC Level of consciousness: awake Pain management: pain level controlled Vital Signs Assessment: post-procedure vital signs reviewed and stable Respiratory status: spontaneous breathing, nonlabored ventilation, respiratory function stable and patient connected to nasal cannula oxygen Cardiovascular status: stable and blood pressure returned to baseline Postop Assessment: no apparent nausea or vomiting Anesthetic complications: no    Last Vitals:  Vitals:   05/14/19 1425 05/14/19 1502  BP: (!) 151/79 (!) 163/85  Pulse: (!) 56 (!) 56  Resp: 14 17  Temp: 36.6 C 36.4 C  SpO2: 93%     Last Pain:  Vitals:   05/14/19 1502  TempSrc: Oral  PainSc:                  Ryan P Ellender

## 2019-05-14 NOTE — Op Note (Signed)
Operative note  Preoperative diagnosis: T11 osteoporotic compression fracture  Postoperative diagnosis: Same  Operative procedure: T11 kyphoplasty  Complications: None  Indications: This is a very pleasant 85 year old man who presents with severe lower thoracic pain.  Imaging studies confirmed an acute T11 osteoporotic compression fracture.  Patient had a prior L3 fracture which was treated with kyphoplasty and she did well.  As result of the severe pain and near complete loss of quality of life we elected to move forward with surgery.  Preoperative medical clearance was obtained and all appropriate risks benefits and alternatives to surgery were discussed with the patient and her husband.  Operative report: Patient is brought the operating room placed upon the operating room table in prone position.  IV/MAC anesthesia was provided and the back was prepped and draped in a standard fashion.  A timeout was taken to confirm patient procedure and all other important data.  2C arms were sterilely brought into the field 1 in the AP the other in the lateral plane.  Identified the T11 vertebral body and marked out the lateral aspect of the pedicles I infiltrated this area with quarter percent Marcaine with epinephrine for analgesia.  2 small stab incisions were made and advanced the Jamshidi needles to the lateral aspect of the T11 pedicles.  Under direct guidance I advanced the Jamshidi needle into the pedicle.  I confirmed proper trajectory using both planes.  Once I was nearing the medial wall of the pedicle on the AP I confirmed that I was just beyond the posterior wall of the vertebral body in the lateral plan.  I then placed a drill and then placed the inflatable bone tamps.  I inflated the balloons to create a void and then deflated them and remove them.  I then injected the cement and allowed to cure.  All this was done using frequent biplane fluoroscopy imaging.  I used a total of approximately 2 cc of  cement on the left side and 1-1/2 cc on the right.  There was no leak of cement superiorly anteriorly inferiorly or posteriorly.  After the cement was allowed to harden I remove the Jamshidi trochars and clean the skin.  2 small 3-0 Vicryl simple sutures were used and then a Band-Aid was applied.  The end of the case all needle sponge counts were correct.  Final x-rays were satisfactory.  The patient was transferred to the PACU without incident.

## 2019-05-14 NOTE — Progress Notes (Signed)
Patient unable to obtain a urine sample in SS.

## 2019-05-14 NOTE — H&P (Signed)
Patient continues to have significant mid to lower thoracic pain.  No change in her clinical exam from her last office visit of 05/08/2019.  Plan on moving forward with a T11 kyphoplasty.  Discussed with the patient and reviewed the risks and benefits and she is expressed an understanding.

## 2019-05-14 NOTE — Brief Op Note (Signed)
05/14/2019  1:39 PM  PATIENT:  Kayla Chambers  85 y.o. female  PRE-OPERATIVE DIAGNOSIS:  T11 Compression Fracture  POST-OPERATIVE DIAGNOSIS:  T11 Compression Fracture  PROCEDURE:  Procedure(s) with comments: KYPHOPLASTY T11 (N/A) - 90 mins  SURGEON:  Surgeon(s) and Role:    Melina Schools, MD - Primary  PHYSICIAN ASSISTANT:   ASSISTANTS: none   ANESTHESIA:   IV sedation and MAC  EBL:  5 mL   BLOOD ADMINISTERED:none  DRAINS: none   LOCAL MEDICATIONS USED:  MARCAINE    and OTHER exparel  SPECIMEN:  No Specimen  DISPOSITION OF SPECIMEN:  N/A  COUNTS:  YES  TOURNIQUET:  * No tourniquets in log *  DICTATION: .Dragon Dictation  PLAN OF CARE: Admit for overnight observation  PATIENT DISPOSITION:  PACU - hemodynamically stable.

## 2019-05-14 NOTE — Transfer of Care (Signed)
Immediate Anesthesia Transfer of Care Note  Patient: Kayla Chambers  Procedure(s) Performed: KYPHOPLASTY T11 (N/A Back)  Patient Location: PACU  Anesthesia Type:MAC  Level of Consciousness: awake, alert  and oriented  Airway & Oxygen Therapy: Patient Spontanous Breathing and Patient connected to nasal cannula oxygen  Post-op Assessment: Report given to RN and Post -op Vital signs reviewed and stable  Post vital signs: Reviewed and stable  Last Vitals:  Vitals Value Taken Time  BP 130/72   Temp    Pulse 72   Resp 33 05/14/19 1335  SpO2 98%   Vitals shown include unvalidated device data.  Last Pain:  Vitals:   05/14/19 0942  TempSrc:   PainSc: 4       Patients Stated Pain Goal: 3 (46/28/63 8177)  Complications: No apparent anesthesia complications

## 2019-05-14 NOTE — Evaluation (Signed)
Physical Therapy Evaluation and Discharge Patient Details Name: Kayla Chambers MRN: [id number] DOB: [birth year] Today's Date: 05/14/2019   History of Present Illness  85 year old adm 05/14/19 for T11 kyphoplasty PMH-anxiety, CAD, afib, CHF, dementia, fibromyalgia, neuropathy, kyphoplasty L3  Clinical Impression  Patient evaluated by Physical Therapy with all education completed and the patient has no further questions. She ambulated with minimal pain x 100 ft with RW. Due to patient's recent fall, recommend HHPT for home safety evaluation. See below for any follow-up Physical Therapy or equipment needs. PT is signing off. Thank you for this referral.     Follow Up Recommendations Home health PT    Equipment Recommendations  None recommended by PT    Recommendations for Other Services OT consult     Precautions / Restrictions Precautions Precautions: Fall Restrictions Weight Bearing Restrictions: No      Mobility  Bed Mobility Overal bed mobility: Needs Assistance Bed Mobility: Rolling;Sidelying to Sit Rolling: Modified independent (Device/Increase time) Sidelying to sit: Min guard(with rail; HOB flat)       General bed mobility comments: nearly needed assist to raise torso  Transfers Overall transfer level: Needs assistance Equipment used: Rolling walker (2 wheeled) Transfers: Sit to/from Stand Sit to Stand: Min assist         General transfer comment: to steady RW as pt transitioned hands from bed to RW; close guard from toilet with grab bar  Ambulation/Gait Ambulation/Gait assistance: Min guard Gait Distance (Feet): 100 Feet Assistive device: Rolling walker (2 wheeled) Gait Pattern/deviations: Step-through pattern     General Gait Details: good cadence; vc for upright posture   Stairs            Wheelchair Mobility    Modified Rankin (Stroke Patients Only)       Balance Overall balance assessment: Mild deficits observed, not formally  tested                                           Pertinent Vitals/Pain Pain Assessment: 0-10 Pain Score: 4  Pain Location: back Pain Descriptors / Indicators: Discomfort Pain Intervention(s): Limited activity within patient's tolerance;Monitored during session;Repositioned    Home Living Family/patient expects to be discharged to:: Private residence Living Arrangements: Spouse/significant other;Children(Son) Available Help at Discharge: Family;Available 24 hours/day Type of Home: House Home Access: Stairs to enter Entrance Stairs-Rails: Left Entrance Stairs-Number of Steps: 2   Home Equipment: [place] - 2 wheels;Bedside commode;Tub bench;Cane - quad      Prior Function Level of Independence: Needs assistance   Gait / Transfers Assistance Needed: prior to back pain, did not use device; has been using RW recently           Hand Dominance   Dominant Hand: Right    Extremity/Trunk Assessment   Upper Extremity Assessment Upper Extremity Assessment: Generalized weakness    Lower Extremity Assessment Lower Extremity Assessment: Generalized weakness    Cervical / Trunk Assessment Cervical / Trunk Assessment: Kyphotic  Communication   Communication: No difficulties  Cognition Arousal/Alertness: Awake/alert Behavior During Therapy: WFL for tasks assessed/performed Overall Cognitive Status: Within Functional Limits for tasks assessed                                        General Comments      Exercises  Assessment/Plan    PT Assessment All further PT needs can be met in the next venue of care  PT Problem List Decreased strength;Decreased balance;Decreased mobility;Decreased knowledge of use of DME;Pain       PT Treatment Interventions      PT Goals (Current goals can be found in the Care Plan section)  Acute Rehab PT Goals Patient Stated Goal: go home tomorrow PT Goal Formulation: All assessment and education  complete, DC therapy    Frequency     Barriers to discharge        Co-evaluation               AM-PAC PT "6 Clicks" Mobility  Outcome Measure Help needed turning from your back to your side while in a flat bed without using bedrails?: A Little Help needed moving from lying on your back to sitting on the side of a flat bed without using bedrails?: A Little Help needed moving to and from a bed to a chair (including a wheelchair)?: A Little Help needed standing up from a chair using your arms (e.g., wheelchair or bedside chair)?: A Little Help needed to walk in hospital room?: A Little Help needed climbing 3-5 steps with a railing? : A Little 6 Click Score: 18    End of Session   Activity Tolerance: Patient tolerated treatment well Patient left: in chair;with call bell/phone within reach Nurse Communication: Mobility status PT Visit Diagnosis: Difficulty in walking, not elsewhere classified (R26.2)    Time: 1995-7900 PT Time Calculation (min) (ACUTE ONLY): 27 min   Charges:   PT Evaluation $PT Eval Low Complexity: 1 Low PT Treatments $Gait Training: 8-22 mins          [employer], PT 05/14/2019, 4:31 PM

## 2019-05-15 ENCOUNTER — Encounter (HOSPITAL_COMMUNITY): Payer: Self-pay | Admitting: Orthopedic Surgery

## 2019-05-15 DIAGNOSIS — I5032 Chronic diastolic (congestive) heart failure: Secondary | ICD-10-CM | POA: Diagnosis not present

## 2019-05-15 DIAGNOSIS — E039 Hypothyroidism, unspecified: Secondary | ICD-10-CM | POA: Diagnosis not present

## 2019-05-15 DIAGNOSIS — I2510 Atherosclerotic heart disease of native coronary artery without angina pectoris: Secondary | ICD-10-CM | POA: Diagnosis not present

## 2019-05-15 DIAGNOSIS — F419 Anxiety disorder, unspecified: Secondary | ICD-10-CM | POA: Diagnosis not present

## 2019-05-15 DIAGNOSIS — I110 Hypertensive heart disease with heart failure: Secondary | ICD-10-CM | POA: Diagnosis not present

## 2019-05-15 DIAGNOSIS — M8088XA Other osteoporosis with current pathological fracture, vertebra(e), initial encounter for fracture: Secondary | ICD-10-CM | POA: Diagnosis not present

## 2019-05-15 NOTE — Progress Notes (Signed)
Patient is discharged from room 3C08 at this time. Alert and in stable condition. IV site d/c'd and instructions read to patient with understanding verbalized. Left unit via wheelchair with all belongings at side. 

## 2019-05-15 NOTE — Evaluation (Signed)
Occupational Therapy Evaluation Patient Details Name: Kayla Chambers MRN: [id number] DOB: 09-10-1927 Today's Date: 05/15/2019    History of Present Illness 85 year old adm 05/14/19 for T11 kyphoplasty PMH-anxiety, CAD, afib, CHF, dementia, fibromyalgia, neuropathy, kyphoplasty L3   Clinical Impression   PT admitted with kyphoplasty L3. Pt currently with functional limitiations due to the deficits listed below (see OT problem list). Pt exiting the bathroom with underwear still half don and lacks awareness.  Pt will benefit from skilled OT to increase their independence and safety with adls and balance to allow discharge home.     Follow Up Recommendations  No OT follow up    Equipment Recommendations  None recommended by OT    Recommendations for Other Services       Precautions / Restrictions Precautions Precautions: Fall Restrictions Weight Bearing Restrictions: No      Mobility Bed Mobility               General bed mobility comments: sitting eob static on arrival with table in front of her  Transfers Overall transfer level: Needs assistance Equipment used: Rolling walker (2 wheeled) Transfers: Sit to/from Stand Sit to Stand: Min guard         General transfer comment: pt able to power up from bed surface but requires cues for safety with RW    Balance Overall balance assessment: Mild deficits observed, not formally tested                                         ADL either performed or assessed with clinical judgement   ADL Overall ADL's : Needs assistance/impaired Eating/Feeding: Independent   Grooming: Supervision/safety   Upper Body Bathing: Supervision/ safety   Lower Body Bathing: Min guard   Upper Body Dressing : Supervision/safety   Lower Body Dressing: Min guard;Sit to/from stand   Toilet Transfer: Minimal Insurance claims handler Details (indicate cue type and reason): cues to remain with RW Toileting-  Clothing Manipulation and Hygiene: Supervision/safety;Sitting/lateral lean       Functional mobility during ADLs: Minimal assistance;Rolling walker General ADL Comments: pt very eager to return home to spouse. pt reports daughter will help PRN     Vision         Perception     Praxis      Pertinent Vitals/Pain Pain Assessment: No/denies pain     Hand Dominance Right   Extremity/Trunk Assessment Upper Extremity Assessment Upper Extremity Assessment: Overall WFL for tasks assessed   Lower Extremity Assessment Lower Extremity Assessment: Defer to PT evaluation   Cervical / Trunk Assessment Cervical / Trunk Assessment: Kyphotic   Communication Communication Communication: No difficulties   Cognition Arousal/Alertness: Awake/alert Behavior During Therapy: WFL for tasks assessed/performed Overall Cognitive Status: Within Functional Limits for tasks assessed                                     General Comments  pt insiste on donning bra even cued for bandage    Exercises     Shoulder Instructions      Home Living Family/patient expects to be discharged to:: Private residence Living Arrangements: Spouse/significant other;Children Available Help at Discharge: Family;Available 24 hours/day Type of Home: House Home Access: Stairs to enter [employer] of Steps: 2 Entrance Stairs-Rails: Left Home Layout: Able to live on  main level with bedroom/bathroom;Bed/bath upstairs     Bathroom Shower/Tub: Teacher, early years/pre: Standard Bathroom Accessibility: Yes How Accessible: Accessible via wheelchair Home Equipment: [place] - 2 wheels;Bedside commode;Tub bench;Cane - quad          Prior Functioning/Environment Level of Independence: Needs assistance  Gait / Transfers Assistance Needed: prior to back pain, did not use device; has been using RW recently ADL's / Homemaking Assistance Needed: indep with adls per patient              OT Problem List: Decreased strength;Decreased activity tolerance;Impaired balance (sitting and/or standing);Decreased safety awareness;Decreased knowledge of use of DME or AE;Decreased knowledge of precautions      OT Treatment/Interventions: Self-care/ADL training;Therapeutic exercise;Neuromuscular education;DME and/or AE instruction;Energy conservation;Manual therapy;Modalities;Cognitive remediation/compensation;Therapeutic activities;Patient/family education;Balance training    OT Goals(Current goals can be found in the care plan section) Acute Rehab OT Goals Patient Stated Goal: to go home today OT Goal Formulation: With patient Time For Goal Achievement: 05/29/19 Potential to Achieve Goals: Good  OT Frequency: Min 2X/week   Barriers to D/C:            Co-evaluation              AM-PAC OT "6 Clicks" Daily Activity     Outcome Measure Help from another person eating meals?: None Help from another person taking care of personal grooming?: A Little Help from another person toileting, which includes using toliet, bedpan, or urinal?: A Little Help from another person bathing (including washing, rinsing, drying)?: A Little Help from another person to put on and taking off regular upper body clothing?: None Help from another person to put on and taking off regular lower body clothing?: A Little 6 Click Score: 20   End of Session Equipment Utilized During Treatment: Rolling walker Nurse Communication: Mobility status;Precautions  Activity Tolerance: Patient tolerated treatment well Patient left: in chair;with call bell/phone within reach  OT Visit Diagnosis: Unsteadiness on feet (R26.81);Muscle weakness (generalized) (M62.81)                Time: 2355-7322 OT Time Calculation (min): 13 min Charges:  OT General Charges $OT Visit: 1 Visit OT Evaluation $OT Eval Moderate Complexity: 1 Mod   Jeri Modena, OTR/L  Acute Rehabilitation Services Pager:  [phone] Office: [phone] .   Jeri Modena 05/15/2019, 9:19 AM

## 2019-05-15 NOTE — Discharge Instructions (Signed)
Please restart ELIQUIS  on Saturday 05/16/19

## 2019-05-15 NOTE — Progress Notes (Signed)
    Subjective: Procedure(s) (LRB): KYPHOPLASTY T11 (N/A) 1 Day Post-Op  Patient reports pain as 1 on 0-10 scale.  Reports decreased leg pain reports incisional back pain   Positive void Negative bowel movement Positive flatus Negative chest pain or shortness of breath  Objective: Vital signs in last 24 hours: Temp:  [97.6 F (36.4 C)-98.5 F (36.9 C)] 98 F (36.7 C) (06/26 0416) Pulse Rate:  [56-72] 61 (06/26 0416) Resp:  [14-19] 16 (06/26 0416) BP: (128-163)/(58-86) 128/58 (06/26 0416) SpO2:  [93 %-98 %] 96 % (06/26 0416) Weight:  [60.8 kg] 60.8 kg (06/25 0910)  Intake/Output from previous day: 06/25 0701 - 06/26 0700 In: 900 [I.V.:700; IV Piggyback:200] Out: 5 [Blood:5]  Labs: Recent Labs    05/14/19 0930  WBC 5.9  RBC 4.32  HCT 42.3  PLT 213   Recent Labs    05/14/19 0936  NA 136  K 4.2  CL 100  CO2 23  BUN 9  CREATININE 0.92  GLUCOSE 93  CALCIUM 9.3   Recent Labs    05/14/19 0930  INR 1.0    Physical Exam: Neurologically intact ABD soft Intact pulses distally Dorsiflexion/Plantar flexion intact Incision: dressing C/D/I Compartment soft Body mass index is 21.96 kg/m.   Assessment/Plan: Patient stable  xrays n/a Continue mobilization with physical therapy Continue care  Advance diet Up with therapy  Pre-operative pain resolved Overall doing well Plan on d/c to home today  Melina Schools, MD [facility] [phone]

## 2019-05-27 DIAGNOSIS — I5032 Chronic diastolic (congestive) heart failure: Secondary | ICD-10-CM | POA: Diagnosis not present

## 2019-05-27 DIAGNOSIS — I2510 Atherosclerotic heart disease of native coronary artery without angina pectoris: Secondary | ICD-10-CM | POA: Diagnosis not present

## 2019-05-27 DIAGNOSIS — M810 Age-related osteoporosis without current pathological fracture: Secondary | ICD-10-CM | POA: Diagnosis not present

## 2019-05-27 DIAGNOSIS — E039 Hypothyroidism, unspecified: Secondary | ICD-10-CM | POA: Diagnosis not present

## 2019-05-27 DIAGNOSIS — I10 Essential (primary) hypertension: Secondary | ICD-10-CM | POA: Diagnosis not present

## 2019-05-27 DIAGNOSIS — I110 Hypertensive heart disease with heart failure: Secondary | ICD-10-CM | POA: Diagnosis not present

## 2019-05-27 DIAGNOSIS — I480 Paroxysmal atrial fibrillation: Secondary | ICD-10-CM | POA: Diagnosis not present

## 2019-06-08 NOTE — Discharge Summary (Signed)
Patient ID: Kayla Chambers MRN: [id number] DOB/AGE: 06/29/1927 85 y.o.  Admit date: 05/14/2019 Discharge date: 06/08/2019  Admission Diagnoses:  Active Problems:   Thoracic compression fracture [facility])   Discharge Diagnoses:  Active Problems:   Thoracic compression fracture (HCC)  status post Procedure(s): KYPHOPLASTY T11  Past Medical History:  Diagnosis Date   Anxiety    takes xanax for sleep.    Aortic insufficiency    a. 03/2013 s/p  AVR w/ biologic 78mm Magna Ease peric tissue valve, model 300TFX, ser# [id number].    Back pain    BACK BRACE/FRACTURE   CAD (coronary artery disease)    a. 01/2013 mod calcification w/o sev dzs;  b. 03/2013 CABG x1 VG->RCA @ time of AVR   Chronic diastolic CHF (congestive heart failure) ([place])    a. 01/2013 echo: EF 50-55%.   Compression fracture of body of thoracic vertebra (HCC)    T-11   Dementia ([place])    Dilated aortic root ([place])    a. 03/2013 s/p Bentall procedure with Ao Root replacement (36mm Valsalva graft) w/ reimplantationof the R and L coronary ostium.   Dysrhythmia    A. 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 Neoadjuvant RT by Dr. Yisroel Ramming. Surveillance plan: MRI 12/08/11 shows changes in presumed post-op seroma/hematoma with slight increase in size - follow up MRI 11/18/12 showed same size. Will continue with q3 month MRI. CT C/A/P annually alternating with CXR at 6 months.    Osteoporosis    Paroxysmal A-fib ([place])    a. Dx 01/2013 - placed on xarelto;  b. 03/2013 s/p left sided MAZE and LA clip @ time of AVR/CABG.   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 female who was admitted 05/14/2019 for operative treatment of T11 compression fracture. Patient failed conservative treatments (please see the history and physical for the specifics) and had severe unremitting pain that affects sleep, daily activities and work/hobbies. After pre-op clearance, the patient was taken to the operating room on 05/14/2019 and underwent  Procedure(s): KYPHOPLASTY T11.    Patient was given perioperative antibiotics:  Anti-infectives (From admission, onward)   Start     Dose/Rate Route Frequency Ordered Stop   05/14/19 2030  ceFAZolin (ANCEF) IVPB 2g/100 mL premix     2 g 200 mL/hr over 30 Minutes Intravenous Every 8 hours 05/14/19 1514 05/15/19 1056   05/14/19 2000  ceFAZolin (ANCEF) IVPB 2g/100 mL premix  Status:  Discontinued     2 g 200 mL/hr over 30 Minutes Intravenous Every 8 hours 05/14/19 1510 05/14/19 1522   05/14/19 0913  ceFAZolin (ANCEF) 2-4 GM/100ML-% IVPB    Note to Pharmacy: Marga Melnick   : cabinet override      05/14/19 0913 05/14/19 1240   05/14/19 0910  ceFAZolin (ANCEF) IVPB 2g/100 mL premix     2 g 200 mL/hr over 30 Minutes Intravenous 30 min  pre-op 05/14/19 9833 05/14/19 1310       Patient was given sequential compression devices and early ambulation to prevent DVT.   Patient benefited maximally from hospital stay and there were no complications. At the time of discharge, the patient was urinating/moving their bowels without difficulty, tolerating a regular diet, pain is controlled with oral pain medications and they have been cleared by PT/OT.   Recent vital signs: No data found.   Recent laboratory studies: No results for input(s): WBC, HGB, HCT, PLT, NA, K, CL, CO2, BUN, CREATININE, GLUCOSE, INR, CALCIUM in the last 72 hours.  Invalid input(s): PT, 2   Discharge  Medications:   Allergies as of 05/15/2019      Reactions   Alendronate Sodium Nausea And Vomiting   Ambien [zolpidem Tartrate] Other (See Comments)   hallucinations   Lac Bovis Nausea Only   Drinks soy milk   Lyrica [pregabalin] Nausea And Vomiting   Milk-related Compounds Diarrhea   Pt can have foods that have milk in it, she just can't drink milk   Neurontin [gabapentin] Nausea And Vomiting   Procaine Hcl Other (See Comments)   Reaction unknown   Toprol Xl [metoprolol Succinate] Other (See Comments)   Does not remember. Tolerates Lopressor 04/10/13, [name]      Medication List    TAKE these medications   acetaminophen 500 MG tablet Commonly known as: TYLENOL Take 2 tablets (1,000 mg total) by mouth every 6 (six) hours as needed for mild pain (pain.). What changed: reasons to take this   acetaminophen-codeine 300-30 MG tablet Commonly known as: TYLENOL #3 Take 1 tablet by mouth every 6 (six) hours as needed for moderate pain or severe pain. What changed:   when to take this  reasons to take this   ALPRAZolam 0.5 MG tablet Commonly known as: XANAX TAKE 1 TABLET BY MOUTH EVERY DAY What changed: when to take this   amiodarone 200 MG tablet Commonly known as: PACERONE TAKE 1 TABLET BY MOUTH TWICE A DAY What changed: when to take this   CALCIUM + D PO Take 1 tablet by mouth daily. CALCIBOOST   colestipol 1 g tablet Commonly known as: COLESTID Take 2 g by mouth every other day. In the morning   CRANBERRY PO Take 1 tablet by mouth 2 (two) times daily as needed (FOR BLADDER/URINARY ISSUES.).   Eliquis 2.5 MG Tabs tablet Generic drug: apixaban TAKE 1 TABLET TWICE A DAY What changed:   how much to take  when to take this   furosemide 40 MG tablet Commonly known as: LASIX TAKE 1 TABLET BY MOUTH TWICE DAILY FOR 2 DAYS THEN TAKE 1 TABLET BY MOUTH EVERY DAY What changed: See the new instructions.   lactase 3000 units tablet Commonly known as: LACTAID Take 1  tablet by mouth daily as needed (only when eating dairy).   levothyroxine 75 MCG tablet Commonly known as: SYNTHROID Take 75 mcg by mouth at bedtime.   LOPERAMIDE A-D PO Take 1 tablet by mouth 4 (four) times daily as needed (diarrhea/loose stools.).   multivitamin with minerals Tabs tablet Take 1 tablet by mouth daily.   polyethylene glycol 17 g packet Commonly known as: MIRALAX / GLYCOLAX Take 17 g by mouth daily.   potassium chloride SA 20 MEQ tablet Commonly known as: K-DUR Take 1 tablet (20 mEq total) by mouth daily at 12 noon.   pravastatin 40 MG tablet Commonly known as: PRAVACHOL TAKE 1 TABLET BY MOUTH EVERY EVENING What changed:  when to take this   PROBIOTIC PO Take 1 capsule by mouth daily as needed (FOR DIGESTIVE HEALTH.).   rOPINIRole 0.25 MG tablet Commonly known as: REQUIP Take 0.25 mg by mouth at bedtime.   VITAMIN C PO Take 1 tablet by mouth daily as needed (FOR IMMUNE SYSTEM BOOST).   Vitamin D3 50 MCG (2000 UT) capsule Take 2,000 Units by mouth daily.     ASK your doctor about these medications   methocarbamol 500 MG tablet Commonly known as: Robaxin Take 1 tablet (500 mg total) by mouth every 8 (eight) hours as needed for up to 5 days for muscle spasms. Ask about: Should I take this medication?   ondansetron 4 MG tablet Commonly known as: Zofran Take 1 tablet (4 mg total) by mouth every 8 (eight) hours as needed for up to 5 days for nausea or vomiting. Ask about: Should I take this medication?       Diagnostic Studies: Dg Chest 2 View  Result Date: 05/14/2019 CLINICAL DATA:  Preop kyphoplasty. EXAM: CHEST - 2 VIEW COMPARISON:  08/14/2018 FINDINGS: Postop CABG, aortic valve replacement, and left atrial appendage clip. Heart size normal. Normal vascularity. Negative for heart failure or pneumonia. Lungs well aerated and clear Severe compression fracture lower thoracic spine was not present previously. IMPRESSION: No acute cardiopulmonary  abnormality Severe compression fracture lower thoracic spine not present on the prior study. Electronically Signed   By: Franchot Gallo M.D.   On: 05/14/2019 10:37   Dg Thoracic Spine 1 View  Result Date: 05/14/2019 CLINICAL DATA:  T11 kyphoplasty. EXAM: DG C-ARM 61-120 MIN; THORACIC SPINE - 1 VIEW COMPARISON:  Chest x-ray from same day. FLUOROSCOPY TIME:  1 minutes, 46 seconds. C-arm fluoroscopic images were obtained intraoperatively and submitted for post operative interpretation. FINDINGS: AP and lateral intraoperative fluoroscopic images demonstrate interval T11 kyphoplasty with mildly improved vertebral body height. No evident cement extravasation. IMPRESSION: Intraoperative fluoroscopic guidance for T11 kyphoplasty. Electronically Signed   By: Titus Dubin M.D.   On: 05/14/2019 14:39   Dg C-arm 1-60 Min  Result Date: 05/14/2019 CLINICAL DATA:  T11 kyphoplasty. EXAM: DG C-ARM 61-120 MIN; THORACIC SPINE - 1 VIEW COMPARISON:  Chest x-ray from same day. FLUOROSCOPY TIME:  1 minutes, 46 seconds. C-arm fluoroscopic images were obtained intraoperatively and submitted for post operative interpretation. FINDINGS: AP and lateral intraoperative fluoroscopic images demonstrate interval T11 kyphoplasty with mildly improved vertebral body height. No evident cement extravasation. IMPRESSION: Intraoperative fluoroscopic guidance for T11 kyphoplasty. Electronically Signed   By: Titus Dubin M.D.   On: 05/14/2019 14:39    Discharge Instructions    Incentive spirometry RT   Complete by: As directed    Incentive spirometry RT   Complete by: As directed         Discharge Plan:  discharge to home  Disposition: stable    Signed: Yvonne Kendall Kinta Martis for [facility] PA-C [facility] [phone] 06/08/2019, 8:52 AM

## 2019-06-12 DIAGNOSIS — M810 Age-related osteoporosis without current pathological fracture: Secondary | ICD-10-CM | POA: Diagnosis not present

## 2019-06-17 DIAGNOSIS — M4854XA Collapsed vertebra, not elsewhere classified, thoracic region, initial encounter for fracture: Secondary | ICD-10-CM | POA: Diagnosis not present

## 2019-06-21 ENCOUNTER — Other Ambulatory Visit: Payer: Self-pay | Admitting: Cardiology

## 2019-06-22 NOTE — Telephone Encounter (Signed)
Ok to refill Danny Zimny  

## 2019-06-24 ENCOUNTER — Telehealth: Payer: Self-pay

## 2019-06-24 NOTE — Telephone Encounter (Signed)
   [facility] Pre-operative Risk Assessment    Request for surgical clearance:  1. What type of surgery is being performed? T12 Kyphoplasty   2. When is this surgery scheduled? 07/08/19  3. What type of clearance is required (medical clearance vs. Pharmacy clearance to hold med vs. Both)? Both  4. Are there any medications that need to be held prior to surgery and how long? Eliquis  5. Practice name and name of physician performing surgery? [facility]   Dr.Dahari Brooks  6. What is your office phone number [phone]   7.   What is your office fax number [phone]  8.   Anesthesia type  General   Kathyrn Lass 06/24/2019, 1:37 PM  _________________________________________________________________   (provider comments below)

## 2019-06-24 NOTE — Telephone Encounter (Signed)
   Primary Cardiologist: [name], MD  Chart reviewed as part of pre-operative protocol coverage. Patient was contacted 06/24/2019 in reference to pre-operative risk assessment for pending surgery as outlined below.  Kayla Chambers was last seen on 02/20/2019 by Dr. Stanford Breed via a telemedicine visit.  Since that day, Kayla Chambers has done fine from a cardiac standpoint. She has been quite limited in activity since her fall a couple months ago and has required multiple kyphoplasty procedures to address ongoing back pain. She denies any complaints of chest pain or SOB. She denies palpitations and monitors her rhythm with the Cardia app regularly and denies any recent atrial fibrillation.  Therefore, based on ACC/AHA guidelines, the patient would be at acceptable risk for the planned procedure without further cardiovascular testing.   Per pharmacy recommendations, patient can hold eliquis 3 days prior to her upcoming Kyphoplasty. Eliquis should be restarted as soon as she is cleared to do so by her surgeon.   I will route this recommendation to the requesting party via Epic fax function and remove from pre-op pool.  Please call with questions.  Abigail Butts, PA-C 06/24/2019, 3:54 PM

## 2019-06-24 NOTE — Telephone Encounter (Signed)
Patient with diagnosis of afib on Eliquis for anticoagulation.    Procedure: T12 Kyphoplasty  Date of procedure: 07/08/2019  CHADS2-VASc score of  6 (CHF, HTN, AGE, CAD, AGE, female)  CrCl 39 ml/min  Per office protocol, patient can hold Eliquis for 3 days prior to procedure.

## 2019-06-26 ENCOUNTER — Ambulatory Visit: Payer: Self-pay | Admitting: Orthopedic Surgery

## 2019-06-29 ENCOUNTER — Ambulatory Visit: Payer: Self-pay | Admitting: Orthopedic Surgery

## 2019-06-29 DIAGNOSIS — M4854XA Collapsed vertebra, not elsewhere classified, thoracic region, initial encounter for fracture: Secondary | ICD-10-CM | POA: Diagnosis not present

## 2019-06-29 DIAGNOSIS — S22080A Wedge compression fracture of T11-T12 vertebra, initial encounter for closed fracture: Secondary | ICD-10-CM | POA: Diagnosis not present

## 2019-06-29 DIAGNOSIS — M4856XD Collapsed vertebra, not elsewhere classified, lumbar region, subsequent encounter for fracture with routine healing: Secondary | ICD-10-CM | POA: Diagnosis not present

## 2019-06-29 DIAGNOSIS — M810 Age-related osteoporosis without current pathological fracture: Secondary | ICD-10-CM | POA: Diagnosis not present

## 2019-06-29 NOTE — H&P (Signed)
Subjective:   Kayla Chambers is A pleasant 85 year old female with known osteoporosis and heart valve who is Approximately 6 weeks status post a T11 kyphoplasty (previous L3 kyphoplasty, known L4 compression deformity) who was doing well until she began experiencing new onset mid back pain, a new x-ray indicated a T12 compression fracture which had not been previously noted. Despite conservative measures including narcotic pain medication, the patient's pain has failed to improve. She would like to move forward with surgical intervention. The patient is here today for a pre-operative History and Physical. They are scheduled for Kyphoplasty T 12 on 07-08-19 with Dr. Rolena Infante at [facility].  Of note, patient has a appointment scheduled with her primary care provider in order to restart medication for her osteoporosis.  Patient Active Problem List   Diagnosis Date Noted  . Thoracic compression fracture ([place]) 05/14/2019  . S/P kyphoplasty 05/07/2018  . Vertebral fracture, osteoporotic, initial encounter ([place]) 04/09/2018  . Compression fracture of vertebrae ([place]) 04/09/2018  . Rheumatic heart disease   . PMR (polymyalgia rheumatica) (HCC)   . Paroxysmal A-fib ([place])   . Intracranial aneurysm   . Fibromyalgia   . Anxiety   . H/O aortic valve replacement 02/02/2016  . Cerebrovascular disease 02/02/2016  . Hyperlipidemia 02/02/2016  . Vertigo 01/07/2015  . Bronchitis 12/02/2013  . Malaise and fatigue 05/26/2013  . PAF (paroxysmal atrial fibrillation) ([place]) 04/08/2013  . HTN (hypertension) 04/08/2013  . Chronic diastolic CHF (congestive heart failure) ([place]) 04/08/2013  . CAD (coronary artery disease) 04/08/2013  . Acute on chronic diastolic heart failure ([place]) 01/31/2013  . Disorder of liver 10/07/2012  . Solitary pulmonary nodule 10/07/2012  . 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 Elevated blood pressure 09/12/2011  . Dizziness 08/09/2011  . Peripheral  neuropathy 03/07/2011  . Aortic insufficiency 03/06/2011  . Dilated aortic root ([place]) 03/06/2011  . 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 Hypothyroidism 03/06/2011  . Polymyalgia rheumatica ([place]) 03/06/2011   Past Medical History:  Diagnosis Date  . Anxiety    takes xanax for sleep.   . Aortic insufficiency    a. 03/2013 s/p  AVR w/ biologic 64mm Magna Ease peric tissue valve, model 300TFX, ser# [id number].   . Back pain    BACK BRACE/FRACTURE  . CAD (coronary artery disease)    a. 01/2013 mod calcification w/o sev dzs;  b. 03/2013 CABG x1 VG->RCA @ time of AVR  . Chronic diastolic CHF (congestive heart failure) ([place])    a. 01/2013 echo: EF 50-55%.  . Compression fracture of body of thoracic vertebra (HCC)    T-11  . Dementia ([place])   . Dilated aortic root ([place])    a. 03/2013 s/p Bent[place] procedure with Ao Root replacement (13mm Valsalva graft) w/ reimplantationof the R and L coronary ostium.  Marland Kitchen Dysrhythmia    A. FIB  . Fibromyalgia   . Headache(784.0)    hx migraines  . Hypothyroidism   . Intracranial aneurysm    in late 1970's  . 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 Neoadjuvant RT by Dr. Yisroel Ramming. Surveillance plan: MRI 12/08/11 shows changes in presumed post-op seroma/hematoma with slight increase in size - follow up MRI 11/18/12 showed same size. Will continue with q3 month MRI. CT C/A/P annually alternating with CXR at 6 months.   . Osteoporosis   . Paroxysmal A-fib ([place])    a. Dx 01/2013 - placed  on xarelto;  b. 03/2013 s/p left sided MAZE and LA clip @ time of AVR/CABG.  . Peripheral neuropathy   . PMR (polymyalgia rheumatica) (HCC)    Inactive  . Rheumatic heart disease   . 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 Vertigo     Past Surgical History:  Procedure Laterality Date  . ABDOMINAL HYSTERECTOMY    . ASCENDING AORTIC ROOT REPLACEMENT N/A  03/31/2013   Procedure: ASCENDING AORTIC ROOT REPLACEMENT;  Surgeon: Grace Isaac, MD;  Location: [place];  Service: Open Heart Surgery;  Laterality: N/A; clip inserted.  Marland Kitchen BLADDER SUSPENSION    . CARDIOVERSION N/A 01/29/2017   Procedure: CARDIOVERSION;  Surgeon: Jerline Pain, MD;  Location: [place];  Service: Cardiovascular;  Laterality: N/A;  . CARDIOVERSION N/A 10/22/2017   Procedure: CARDIOVERSION;  Surgeon: Lelon Perla, MD;  Location: [facility] ENDOSCOPY;  Service: Cardiovascular;  Laterality: N/A;  . COLONOSCOPY  10/25/2011   Procedure: COLONOSCOPY;  Surgeon: Garlan Fair, MD;  Location: [facility] ENDOSCOPY;  Service: Endoscopy;  Laterality: N/A;  . EYE SURGERY Bilateral    cataracts  . INTRAOPERATIVE TRANSESOPHAGEAL ECHOCARDIOGRAM N/A 03/31/2013   Procedure: INTRAOPERATIVE TRANSESOPHAGEAL ECHOCARDIOGRAM;  Surgeon: Grace Isaac, MD;  Location: [place];  Service: Open Heart Surgery;  Laterality: N/A;  . KYPHOPLASTY N/A 05/07/2018   Procedure: KYPHOPLASTY LUMBAR THREE;  Surgeon: Melina Schools, MD;  Location: [place];  Service: Orthopedics;  Laterality: N/A;  . KYPHOPLASTY N/A 05/14/2019   Procedure: KYPHOPLASTY T11;  Surgeon: Melina Schools, MD;  Location: [employer];  Service: Orthopedics;  Laterality: N/A;  90 mins  . LEFT AND RIGHT HEART CATHETERIZATION WITH CORONARY ANGIOGRAM Right 02/02/2013   Procedure: LEFT AND RIGHT HEART CATHETERIZATION WITH CORONARY ANGIOGRAM;  Surgeon: Hillary Bow, MD;  Location: [facility] CATH LAB;  Service: Cardiovascular;  Laterality: Right;  Marland Kitchen MAZE N/A 03/31/2013   Procedure: MAZE;  Surgeon: Grace Isaac, MD;  Location: [place];  Service: Open Heart Surgery;  Laterality: N/A;  . Sarcoma resection  2011    Current Outpatient Medications  Medication Sig Dispense Refill Last Dose  . acetaminophen (TYLENOL) 500 MG tablet Take 2 tablets (1,000 mg total) by mouth every 6 (six) hours as needed for mild pain (pain.). 30 tablet 0   . acetaminophen-codeine (TYLENOL #3)  300-30 MG tablet Take 1 tablet by mouth every 6 (six) hours as needed for moderate pain or severe pain. 30 tablet 0   . ALPRAZolam (XANAX) 0.5 MG tablet TAKE 1 TABLET BY MOUTH EVERY DAY 90 tablet 0   . amiodarone (PACERONE) 200 MG tablet TAKE 1 TABLET BY MOUTH TWICE A DAY (Patient taking differently: Take 200 mg by mouth daily. ) 180 tablet 1 05/14/2019 at 0800  . Ascorbic Acid (VITAMIN C PO) Take 1 tablet by mouth daily as needed (FOR IMMUNE SYSTEM BOOST).    Past Week at Unknown time  . Calcium Carbonate-Vitamin D (CALCIUM + D PO) Take 1 tablet by mouth daily. CALCIBOOST   Past Week at Unknown time  . Cholecalciferol (VITAMIN D3) 2000 units capsule Take 2,000 Units by mouth daily.   Past Week at Unknown time  . colestipol (COLESTID) 1 g tablet Take 2 g by mouth every other day. In the morning   Past Week at Unknown time  . CRANBERRY PO Take 1 tablet by mouth 2 (two) times daily as needed (FOR BLADDER/URINARY ISSUES.).    Past Week at Unknown  time  . ELIQUIS 2.5 MG TABS tablet TAKE 1 TABLET TWICE A DAY (Patient taking differently: Take 2.5 mg by mouth 2 (two) times a day. ) 180 tablet 3 05/10/2019  . furosemide (LASIX) 40 MG tablet TAKE 1 TABLET BY MOUTH TWICE DAILY FOR 2 DAYS THEN TAKE 1 TABLET BY MOUTH EVERY DAY (Patient taking differently: Take 40 mg by mouth daily. ) 90 tablet 0 05/13/2019 at Unknown time  . lactase (LACTAID) 3000 UNITS tablet Take 1 tablet by mouth daily as needed (only when eating dairy).   Past Week at Unknown time  . levothyroxine (SYNTHROID, LEVOTHROID) 75 MCG tablet Take 75 mcg by mouth at bedtime.    05/13/2019 at Unknown time  . Loperamide HCl (LOPERAMIDE A-D PO) Take 1 tablet by mouth 4 (four) times daily as needed (diarrhea/loose stools.).    05/13/2019 at Unknown time  . Multiple Vitamin (MULTIVITAMIN WITH MINERALS) TABS Take 1 tablet by mouth daily.   05/13/2019 at Unknown time  . polyethylene glycol (MIRALAX / GLYCOLAX) packet Take 17 g by mouth daily. 14 each 0 Past Week  at Unknown time  . potassium chloride SA (K-DUR) 20 MEQ tablet Take 1 tablet (20 mEq total) by mouth daily at 12 noon. 90 tablet 2 Past Week at Unknown time  . pravastatin (PRAVACHOL) 40 MG tablet TAKE 1 TABLET BY MOUTH EVERY EVENING (Patient taking differently: Take 40 mg by mouth at bedtime. ) 90 tablet 3 05/13/2019 at Unknown time  . Probiotic Product (PROBIOTIC PO) Take 1 capsule by mouth daily as needed ([place].).   05/13/2019 at Unknown time  . rOPINIRole (REQUIP) 0.25 MG tablet Take 0.25 mg by mouth at bedtime.    05/13/2019 at Unknown time   No current facility-administered medications for this visit.    Allergies  Allergen Reactions  . Alendronate Sodium Nausea And Vomiting  . Ambien [Zolpidem Tartrate] Other (See Comments)    hallucinations  . Lac Bovis Nausea Only    Drinks soy milk  . Lyrica [Pregabalin] Nausea And Vomiting  . Milk-Related Compounds Diarrhea    Pt can have foods that have milk in it, she just can't drink milk  . Neurontin [Gabapentin] Nausea And Vomiting  . Procaine Hcl Other (See Comments)    Reaction unknown  . Toprol Xl [Metoprolol Succinate] Other (See Comments)    Does not remember. Tolerates Lopressor 04/10/13, Remigio Eisenmenger    Social History   Tobacco Use  . Smoking status: Never Smoker  . Smokeless tobacco: Never Used  Substance Use Topics  . Alcohol use: No    Family History  Problem Relation Age of Onset  . Heart disease Mother   . Stroke Mother   . Arthritis Father   . Cancer Father   . Anesthesia problems Brother   . Hypertension Sister     Review of Systems As stated in HPI  Objective:   Clinical exam: Kayla Chambers returns today for follow-up of her thoraco-/lumbar MRI.  Cardio: Regular rate and rhythm, There is a clicking noted on auscultation the patient's husband states is due to her bovine valve.  Pulmonary: Lungs clear to auscultation bilaterally  Abdomen: Bowel sounds 4, soft, nontender, nondistended, no  hepatosplenomegaly.  Peripheral pulses: 1+ dorsalis pedis/posterior tibialis pulses bilaterally. Compartment soft and nontender.  Gait pattern: Significant limited gait due to severe lower thoracic pain.  Assistive devices: Wheelchair  Neuro: No focal neurological deficits on motor exam the lower extremity. Intermittent dysesthesias bilaterally in the lower extremity. Negative Babinski  test, 1+ deep tendon reflexes at the knee and Achilles.  Musculoskeletal: Significant pain at the lower thoracic region. Pain is worse with palpation or attempts at range of motion. No significant lumbar spine pain with palpation.  X-rays of the thoracic spine demonstrate a T11 Kyphoplasty. A new T12 kyphoplasty. When evaluating the MRI from 2019 it did not appear to be there. There is also a L3 kyphoplasty with a L4 known compression fracture which has not been augmented.  Lumbar MRI: MRI of the lumbar spine dated June 18, 2019 shows a increased compression deformity of T11 When compared to previous MRI From September 2019, this T11 compression fracture was augmented with a kyphoplasty procedure on 05/14/2019. The T12 level was not noted on MRI in September 2019 as an issue. The June 2020 MRI also does not note T12 to be fractured. In the most recent MRI dated June 18, 2019,T here is a interval development of mild superior endplate compression deformity of T12 without significant loss of height.  Assessment:   Kayla Chambers is A pleasant 85 year old female with known osteoporosis and heart valve who is Approximately 6 weeks status post a T11 kyphoplasty (previous L3 kyphoplasty, known L4 compression deformity) who was doing well until she began experiencing new onset mid back pain, a new x-ray indicated a T12 compression fracture which had not been previously noted. MRI [place] T12 compression fracture. There is been no significant change in the patient's physical exam, she continues to be essentially wheelchair bound  but has 5/5 motor strength bilaterally in her lower extremity. Despite conservative measures including narcotic pain medication, the patient's pain has failed to improve And therefore her and her husband would like to move forward with surgical intervention, I do think that this is reasonable.   Plan:   We will move forward with a T12 kyphoplasty at [facility]  I have gone over the procedure as well as the risks and benefits with the patient and her husband and all of their questions were encouraged and addressed.  Risks of surgery include: Infection, bleeding, death, stroke, paralysis, nerve damage, leak of cement, need for additional surgery including open decompression. Ongoing or worse pain.  Goals of surgery: Reduction in pain, and improvement in quality of life  We have also discussed the post-operative recovery period to include: bathing/showering restrictions, wound healing, activity (and driving) restrictions, medications/pain mangement.  We have also discussed post-operative redflags to include: signs and symptoms of postoperative infection, DVT/PE.  Patient and her husband expressed understanding of all this.  Follow-up: 2 weeks status post surgery.

## 2019-07-04 ENCOUNTER — Other Ambulatory Visit (HOSPITAL_COMMUNITY)
Admission: RE | Admit: 2019-07-04 | Discharge: 2019-07-04 | Disposition: A | Discharge status: Home / Self Care | Payer: Medicare Other | Source: Ambulatory Visit | Attending: Orthopedic Surgery | Admitting: Orthopedic Surgery

## 2019-07-04 DIAGNOSIS — Z20828 Contact with and (suspected) exposure to other viral communicable diseases: Secondary | ICD-10-CM | POA: Insufficient documentation

## 2019-07-04 DIAGNOSIS — Z01812 Encounter for preprocedural laboratory examination: Secondary | ICD-10-CM | POA: Diagnosis not present

## 2019-07-04 LAB — SARS CORONAVIRUS 2 (TAT 6-24 HRS): SARS Coronavirus 2: NEGATIVE

## 2019-07-07 ENCOUNTER — Other Ambulatory Visit: Payer: Self-pay

## 2019-07-07 ENCOUNTER — Encounter (HOSPITAL_COMMUNITY): Payer: Self-pay

## 2019-07-07 NOTE — Progress Notes (Signed)
Spoke with husband.  He states nothing has changed since recent visit.  Eliquis was stopped 07/05/19.  Negative covid test and pt has been quarantined.

## 2019-07-08 ENCOUNTER — Ambulatory Visit (HOSPITAL_COMMUNITY): Payer: Medicare Other | Admitting: Anesthesiology

## 2019-07-08 ENCOUNTER — Ambulatory Visit (HOSPITAL_COMMUNITY): Payer: Medicare Other

## 2019-07-08 ENCOUNTER — Observation Stay (HOSPITAL_COMMUNITY)
Admission: RE | Admit: 2019-07-08 | Discharge: 2019-07-09 | Disposition: A | Discharge status: Home / Self Care | Payer: Medicare Other | Attending: Orthopedic Surgery | Admitting: Orthopedic Surgery

## 2019-07-08 ENCOUNTER — Encounter (HOSPITAL_COMMUNITY): Payer: Self-pay

## 2019-07-08 ENCOUNTER — Other Ambulatory Visit: Payer: Self-pay

## 2019-07-08 ENCOUNTER — Encounter (HOSPITAL_COMMUNITY)
Admission: RE | Disposition: A | Discharge status: Home / Self Care | Payer: Self-pay | Source: Home / Self Care | Attending: Orthopedic Surgery

## 2019-07-08 DIAGNOSIS — J449 Chronic obstructive pulmonary disease, unspecified: Secondary | ICD-10-CM | POA: Diagnosis not present

## 2019-07-08 DIAGNOSIS — E039 Hypothyroidism, unspecified: Secondary | ICD-10-CM | POA: Diagnosis not present

## 2019-07-08 DIAGNOSIS — I2510 Atherosclerotic heart disease of native coronary artery without angina pectoris: Secondary | ICD-10-CM | POA: Insufficient documentation

## 2019-07-08 DIAGNOSIS — I671 Cerebral aneurysm, nonruptured: Secondary | ICD-10-CM | POA: Insufficient documentation

## 2019-07-08 DIAGNOSIS — I480 Paroxysmal atrial fibrillation: Secondary | ICD-10-CM | POA: Diagnosis not present

## 2019-07-08 DIAGNOSIS — Z79890 Hormone replacement therapy (postmenopausal): Secondary | ICD-10-CM | POA: Insufficient documentation

## 2019-07-08 DIAGNOSIS — I5032 Chronic diastolic (congestive) heart failure: Secondary | ICD-10-CM | POA: Diagnosis not present

## 2019-07-08 DIAGNOSIS — Z419 Encounter for procedure for purposes other than remedying health state, unspecified: Secondary | ICD-10-CM

## 2019-07-08 DIAGNOSIS — E785 Hyperlipidemia, unspecified: Secondary | ICD-10-CM | POA: Insufficient documentation

## 2019-07-08 DIAGNOSIS — Z85831 Personal history of malignant neoplasm of soft tissue: Secondary | ICD-10-CM | POA: Insufficient documentation

## 2019-07-08 DIAGNOSIS — M353 Polymyalgia rheumatica: Secondary | ICD-10-CM | POA: Insufficient documentation

## 2019-07-08 DIAGNOSIS — Z79899 Other long term (current) drug therapy: Secondary | ICD-10-CM | POA: Diagnosis not present

## 2019-07-08 DIAGNOSIS — M8008XA Age-related osteoporosis with current pathological fracture, vertebra(e), initial encounter for fracture: Secondary | ICD-10-CM | POA: Diagnosis not present

## 2019-07-08 DIAGNOSIS — Z01818 Encounter for other preprocedural examination: Secondary | ICD-10-CM

## 2019-07-08 DIAGNOSIS — F419 Anxiety disorder, unspecified: Secondary | ICD-10-CM | POA: Diagnosis not present

## 2019-07-08 DIAGNOSIS — Z952 Presence of prosthetic heart valve: Secondary | ICD-10-CM | POA: Insufficient documentation

## 2019-07-08 DIAGNOSIS — Z7901 Long term (current) use of anticoagulants: Secondary | ICD-10-CM | POA: Insufficient documentation

## 2019-07-08 DIAGNOSIS — M8448XA Pathological fracture, other site, initial encounter for fracture: Secondary | ICD-10-CM | POA: Diagnosis not present

## 2019-07-08 DIAGNOSIS — Z923 Personal history of irradiation: Secondary | ICD-10-CM | POA: Diagnosis not present

## 2019-07-08 DIAGNOSIS — Z981 Arthrodesis status: Secondary | ICD-10-CM | POA: Diagnosis not present

## 2019-07-08 DIAGNOSIS — F0390 Unspecified dementia without behavioral disturbance: Secondary | ICD-10-CM | POA: Diagnosis not present

## 2019-07-08 DIAGNOSIS — Z951 Presence of aortocoronary bypass graft: Secondary | ICD-10-CM | POA: Insufficient documentation

## 2019-07-08 DIAGNOSIS — I110 Hypertensive heart disease with heart failure: Secondary | ICD-10-CM | POA: Diagnosis not present

## 2019-07-08 DIAGNOSIS — S22000A Wedge compression fracture of unspecified thoracic vertebra, initial encounter for closed fracture: Secondary | ICD-10-CM | POA: Diagnosis present

## 2019-07-08 HISTORY — PX: KYPHOPLASTY: SHX5884

## 2019-07-08 LAB — URINALYSIS, COMPLETE (UACMP) WITH MICROSCOPIC
Bilirubin Urine: NEGATIVE
Glucose, UA: NEGATIVE mg/dL
Ketones, ur: 5 mg/dL — AB
Nitrite: POSITIVE — AB
Protein, ur: 30 mg/dL — AB
Specific Gravity, Urine: 1.012 (ref 1.005–1.030)
WBC, UA: >50 WBC/hpf — ABNORMAL HIGH (ref 0–5)
pH: 8 (ref 5.0–8.0)

## 2019-07-08 LAB — CBC
HCT: 43.3 % (ref 36.0–46.0)
Hemoglobin: 14.4 g/dL (ref 12.0–15.0)
MCH: 31.7 pg (ref 26.0–34.0)
MCHC: 33.3 g/dL (ref 30.0–36.0)
MCV: 95.4 fL (ref 80.0–100.0)
Platelets: 160 10*3/uL (ref 150–400)
RBC: 4.54 MIL/uL (ref 3.87–5.11)
RDW: 14.8 % (ref 11.5–15.5)
WBC: 6.4 10*3/uL (ref 4.0–10.5)
nRBC: 0 % (ref 0.0–0.2)

## 2019-07-08 LAB — APTT: aPTT: 29 seconds (ref 24–36)

## 2019-07-08 LAB — BASIC METABOLIC PANEL
Anion gap: 10 (ref 5–15)
BUN: 9 mg/dL (ref 8–23)
CO2: 25 mmol/L (ref 22–32)
Calcium: 9.5 mg/dL (ref 8.9–10.3)
Chloride: 103 mmol/L (ref 98–111)
Creatinine, Ser: 0.65 mg/dL (ref 0.44–1.00)
GFR calc Af Amer: >60 mL/min (ref >60)
GFR calc non Af Amer: >60 mL/min (ref >60)
Glucose, Bld: 93 mg/dL (ref 70–99)
Potassium: 4 mmol/L (ref 3.5–5.1)
Sodium: 138 mmol/L (ref 135–145)

## 2019-07-08 LAB — PROTIME-INR
INR: 1 (ref 0.8–1.2)
Prothrombin Time: 12.6 seconds (ref 11.4–15.2)

## 2019-07-08 SURGERY — KYPHOPLASTY
Anesthesia: Monitor Anesthesia Care | Site: Spine Thoracic

## 2019-07-08 MED ORDER — METHOCARBAMOL 1000 MG/10ML IJ SOLN
500.0000 mg | Freq: Four times a day (QID) | INTRAVENOUS | Status: DC | PRN
  Filled 2019-07-08: qty 5

## 2019-07-08 MED ORDER — TRAMADOL HCL 50 MG PO TABS
ORAL_TABLET | ORAL | Status: AC
  Filled 2019-07-08: qty 1

## 2019-07-08 MED ORDER — CEFAZOLIN SODIUM-DEXTROSE 2-4 GM/100ML-% IV SOLN
2.0000 g | Freq: Three times a day (TID) | INTRAVENOUS | Status: AC
  Administered 2019-07-08 – 2019-07-09 (×2): 2 g via INTRAVENOUS
  Filled 2019-07-08 (×2): qty 100

## 2019-07-08 MED ORDER — MIDAZOLAM HCL 2 MG/2ML IJ SOLN
INTRAMUSCULAR | Status: AC
  Filled 2019-07-08: qty 2

## 2019-07-08 MED ORDER — FENTANYL CITRATE (PF) 100 MCG/2ML IJ SOLN: 25.0000 ug | INTRAMUSCULAR | Status: DC | PRN

## 2019-07-08 MED ORDER — ACETAMINOPHEN 650 MG RE SUPP: 650.0000 mg | RECTAL | Status: DC | PRN

## 2019-07-08 MED ORDER — POLYETHYLENE GLYCOL 3350 17 G PO PACK: 17.0000 g | PACK | Freq: Every day | ORAL | Status: DC | PRN

## 2019-07-08 MED ORDER — FENTANYL CITRATE (PF) 250 MCG/5ML IJ SOLN
INTRAMUSCULAR | Status: DC | PRN
  Administered 2019-07-08 (×2): 50 ug via INTRAVENOUS

## 2019-07-08 MED ORDER — MENTHOL 3 MG MT LOZG: 1.0000 | LOZENGE | OROMUCOSAL | Status: DC | PRN

## 2019-07-08 MED ORDER — ACETAMINOPHEN 10 MG/ML IV SOLN: 1000.0000 mg | Freq: Once | INTRAVENOUS | Status: DC | PRN

## 2019-07-08 MED ORDER — LACTATED RINGERS IV SOLN: INTRAVENOUS | Status: DC

## 2019-07-08 MED ORDER — ONDANSETRON HCL 4 MG PO TABS: 4.0000 mg | ORAL_TABLET | Freq: Three times a day (TID) | ORAL | 0 refills | Status: AC | PRN

## 2019-07-08 MED ORDER — IOPAMIDOL (ISOVUE-300) INJECTION 61%
INTRAVENOUS | Status: DC | PRN
  Administered 2019-07-08: 10 mL via INTRAVENOUS

## 2019-07-08 MED ORDER — ONDANSETRON HCL 4 MG/2ML IJ SOLN: 4.0000 mg | Freq: Four times a day (QID) | INTRAMUSCULAR | Status: DC | PRN

## 2019-07-08 MED ORDER — METHOCARBAMOL 500 MG PO TABS: 500.0000 mg | ORAL_TABLET | Freq: Three times a day (TID) | ORAL | 0 refills | Status: AC | PRN

## 2019-07-08 MED ORDER — PROPOFOL 500 MG/50ML IV EMUL
INTRAVENOUS | Status: DC | PRN
  Administered 2019-07-08: 55 ug/kg/min via INTRAVENOUS

## 2019-07-08 MED ORDER — BUPIVACAINE-EPINEPHRINE 0.25% -1:200000 IJ SOLN
INTRAMUSCULAR | Status: DC | PRN
  Administered 2019-07-08: 26 mL

## 2019-07-08 MED ORDER — BUPIVACAINE-EPINEPHRINE (PF) 0.25% -1:200000 IJ SOLN
INTRAMUSCULAR | Status: AC
  Filled 2019-07-08: qty 30

## 2019-07-08 MED ORDER — 0.9 % SODIUM CHLORIDE (POUR BTL) OPTIME
TOPICAL | Status: DC | PRN
  Administered 2019-07-08: 1000 mL

## 2019-07-08 MED ORDER — LEVOTHYROXINE SODIUM 75 MCG PO TABS
75.0000 ug | ORAL_TABLET | Freq: Every day | ORAL | Status: DC
  Administered 2019-07-09: 75 ug via ORAL
  Filled 2019-07-08: qty 1

## 2019-07-08 MED ORDER — SODIUM CHLORIDE 0.9% FLUSH: 3.0000 mL | INTRAVENOUS | Status: DC | PRN

## 2019-07-08 MED ORDER — METHOCARBAMOL 500 MG PO TABS
ORAL_TABLET | ORAL | Status: AC
  Filled 2019-07-08: qty 1

## 2019-07-08 MED ORDER — CEFAZOLIN SODIUM-DEXTROSE 2-4 GM/100ML-% IV SOLN
2.0000 g | INTRAVENOUS | Status: AC
  Administered 2019-07-08: 2 g via INTRAVENOUS
  Filled 2019-07-08: qty 100

## 2019-07-08 MED ORDER — PROPOFOL 10 MG/ML IV BOLUS
INTRAVENOUS | Status: AC
  Filled 2019-07-08: qty 20

## 2019-07-08 MED ORDER — AMIODARONE HCL 200 MG PO TABS
200.0000 mg | ORAL_TABLET | Freq: Every day | ORAL | Status: DC
  Filled 2019-07-08: qty 1

## 2019-07-08 MED ORDER — PROMETHAZINE HCL 25 MG/ML IJ SOLN: 6.2500 mg | INTRAMUSCULAR | Status: DC | PRN

## 2019-07-08 MED ORDER — ONDANSETRON HCL 4 MG PO TABS
4.0000 mg | ORAL_TABLET | Freq: Four times a day (QID) | ORAL | Status: DC | PRN
  Administered 2019-07-09: 4 mg via ORAL
  Filled 2019-07-08: qty 1

## 2019-07-08 MED ORDER — FENTANYL CITRATE (PF) 250 MCG/5ML IJ SOLN
INTRAMUSCULAR | Status: AC
  Filled 2019-07-08: qty 5

## 2019-07-08 MED ORDER — FUROSEMIDE 40 MG PO TABS: 40.0000 mg | ORAL_TABLET | Freq: Every day | ORAL | Status: DC

## 2019-07-08 MED ORDER — LACTATED RINGERS IV SOLN
INTRAVENOUS | Status: DC
  Administered 2019-07-08: 14:00:00 via INTRAVENOUS

## 2019-07-08 MED ORDER — PRAVASTATIN SODIUM 40 MG PO TABS
40.0000 mg | ORAL_TABLET | Freq: Every day | ORAL | Status: DC
  Administered 2019-07-08: 40 mg via ORAL
  Filled 2019-07-08: qty 1

## 2019-07-08 MED ORDER — PHENOL 1.4 % MT LIQD: 1.0000 | OROMUCOSAL | Status: DC | PRN

## 2019-07-08 MED ORDER — SODIUM CHLORIDE 0.9% FLUSH
3.0000 mL | Freq: Two times a day (BID) | INTRAVENOUS | Status: DC
  Administered 2019-07-08: 3 mL via INTRAVENOUS

## 2019-07-08 MED ORDER — METHOCARBAMOL 500 MG PO TABS
500.0000 mg | ORAL_TABLET | Freq: Four times a day (QID) | ORAL | Status: DC | PRN
  Administered 2019-07-08 – 2019-07-09 (×3): 500 mg via ORAL
  Filled 2019-07-08 (×2): qty 1

## 2019-07-08 MED ORDER — ACETAMINOPHEN 325 MG PO TABS
650.0000 mg | ORAL_TABLET | ORAL | Status: DC | PRN
  Administered 2019-07-08 – 2019-07-09 (×3): 650 mg via ORAL
  Filled 2019-07-08 (×3): qty 2

## 2019-07-08 MED ORDER — TRAMADOL HCL 50 MG PO TABS: 50.0000 mg | ORAL_TABLET | Freq: Four times a day (QID) | ORAL | 0 refills | Status: AC | PRN

## 2019-07-08 MED ORDER — TRAMADOL HCL 50 MG PO TABS
50.0000 mg | ORAL_TABLET | Freq: Four times a day (QID) | ORAL | Status: DC
  Administered 2019-07-08 – 2019-07-09 (×3): 50 mg via ORAL
  Filled 2019-07-08 (×2): qty 1

## 2019-07-08 SURGICAL SUPPLY — 49 items
ADH SKN CLS APL DERMABOND .7 (GAUZE/BANDAGES/DRESSINGS) ×1
ADH SKN CLS LQ APL DERMABOND (GAUZE/BANDAGES/DRESSINGS) ×1
BLADE SURG 15 STRL LF DISP TIS (BLADE) ×1 IMPLANT
BLADE SURG 15 STRL SS (BLADE) ×3
BNDG ADH 1X3 SHEER STRL LF (GAUZE/BANDAGES/DRESSINGS) ×6 IMPLANT
BNDG ADH THN 3X1 STRL LF (GAUZE/BANDAGES/DRESSINGS) ×2
CEMENT KYPHON CX01A KIT/MIXER (Cement) ×3 IMPLANT
COVER SURGICAL LIGHT HANDLE (MISCELLANEOUS) ×3 IMPLANT
COVER WAND RF STERILE (DRAPES) ×3 IMPLANT
CURETTE EXPRESS SZ2 7MM (INSTRUMENTS) IMPLANT
CURETTE WEDGE 8.5MM KYPHX (MISCELLANEOUS) IMPLANT
CURRETTE EXPRESS SZ2 7MM (INSTRUMENTS)
DERMABOND ADHESIVE PROPEN (GAUZE/BANDAGES/DRESSINGS) ×2
DERMABOND ADVANCED (GAUZE/BANDAGES/DRESSINGS) ×2
DERMABOND ADVANCED .7 DNX12 (GAUZE/BANDAGES/DRESSINGS) ×1 IMPLANT
DERMABOND ADVANCED .7 DNX6 (GAUZE/BANDAGES/DRESSINGS) IMPLANT
DRAPE C-ARM 42X72 X-RAY (DRAPES) ×6 IMPLANT
DRAPE INCISE IOBAN 66X45 STRL (DRAPES) ×3 IMPLANT
DRAPE LAPAROTOMY T 102X78X121 (DRAPES) ×3 IMPLANT
DRAPE SURG 17X23 STRL (DRAPES) ×3 IMPLANT
DRAPE U-SHAPE 47X51 STRL (DRAPES) ×3 IMPLANT
DRAPE WARM FLUID 44X44 (DRAPES) ×3 IMPLANT
DURAPREP 26ML APPLICATOR (WOUND CARE) ×3 IMPLANT
GLOVE BIO SURGEON STRL SZ 6.5 (GLOVE) ×2 IMPLANT
GLOVE BIO SURGEONS STRL SZ 6.5 (GLOVE) ×1
GLOVE BIOGEL PI IND STRL 6.5 (GLOVE) ×1 IMPLANT
GLOVE BIOGEL PI IND STRL 8.5 (GLOVE) ×1 IMPLANT
GLOVE BIOGEL PI INDICATOR 6.5 (GLOVE) ×2
GLOVE BIOGEL PI INDICATOR 8.5 (GLOVE) ×2
GLOVE SS BIOGEL STRL SZ 8.5 (GLOVE) ×1 IMPLANT
GLOVE SUPERSENSE BIOGEL SZ 8.5 (GLOVE) ×2
GOWN STRL REUS W/ TWL LRG LVL3 (GOWN DISPOSABLE) ×2 IMPLANT
GOWN STRL REUS W/TWL 2XL LVL3 (GOWN DISPOSABLE) ×3 IMPLANT
GOWN STRL REUS W/TWL LRG LVL3 (GOWN DISPOSABLE) ×3
KIT BASIN OR (CUSTOM PROCEDURE TRAY) ×3 IMPLANT
KIT TURNOVER KIT B (KITS) ×3 IMPLANT
NDL SPNL 18GX3.5 QUINCKE PK (NEEDLE) ×2 IMPLANT
NEEDLE SPNL 18GX3.5 QUINCKE PK (NEEDLE) ×6 IMPLANT
NS IRRIG 1000ML POUR BTL (IV SOLUTION) ×3 IMPLANT
PACK SURGICAL SETUP 50X90 (CUSTOM PROCEDURE TRAY) ×3 IMPLANT
PACK UNIVERSAL I (CUSTOM PROCEDURE TRAY) ×3 IMPLANT
PAD ARMBOARD 7.5X6 YLW CONV (MISCELLANEOUS) ×6 IMPLANT
SPONGE LAP 4X18 RFD (DISPOSABLE) ×3 IMPLANT
SUT MNCRL AB 3-0 PS2 18 (SUTURE) ×3 IMPLANT
SYR CONTROL 10ML LL (SYRINGE) ×3 IMPLANT
TOWEL GREEN STERILE (TOWEL DISPOSABLE) ×3 IMPLANT
TRAY KYPHOPAK 15/3 ONESTEP 1ST (MISCELLANEOUS) ×2 IMPLANT
TRAY KYPHOPAK 20/3 ONESTEP 1ST (MISCELLANEOUS) IMPLANT
WATER STERILE IRR 1000ML POUR (IV SOLUTION) ×3 IMPLANT

## 2019-07-08 NOTE — Transfer of Care (Signed)
Immediate Anesthesia Transfer of Care Note  Patient: [name]  Procedure(s) Performed: KYPHOPLASTY THORACIC TWELVE (N/A Spine Thoracic)  Patient Location: PACU  Anesthesia Type:General  Level of Consciousness: awake, alert  and oriented  Airway & Oxygen Therapy: Patient Spontanous Breathing and Patient connected to nasal cannula oxygen  Post-op Assessment: Report given to RN and Post -op Vital signs reviewed and stable  Post vital signs: Reviewed and stable  Last Vitals:  Vitals Value Taken Time  BP 146/86 07/08/19 1753  Temp    Pulse 70 07/08/19 1756  Resp 23 07/08/19 1756  SpO2 98 % 07/08/19 1756  Vitals shown include unvalidated device data.  Last Pain:  Vitals:   07/08/19 1257  TempSrc: Oral         Complications: No apparent anesthesia complications

## 2019-07-08 NOTE — Anesthesia Procedure Notes (Signed)
Procedure Name: MAC Date/Time: 07/08/2019 5:00 PM Performed by: Alain Marion, CRNA Pre-anesthesia Checklist: Patient identified, Emergency Drugs available, Suction available and Patient being monitored Oxygen Delivery Method: Simple face mask Placement Confirmation: positive ETCO2

## 2019-07-08 NOTE — Op Note (Signed)
Operative report  Preoperative diagnosis T12 osteoporotic compression fracture  Postoperative diagnosis: Same  Operative procedure: T12 kyphoplasty  Complications: None  History: Kayla Chambers is a very pleasant 85 year old woman who recently had a T11 and L3 kyphoplasty for osteoporotic compression fractures.  She initially did well after surgery and then started having severe increasing pain.  MRI demonstrated a superior deformity of the T12 endplate with acute edema consistent with an endplate fracture.  As result of the severe increase in pain we elected to move forward with a T12 kyphoplasty.  Operative report patient was brought the operating placed by the operating room table.  After successful induction of IV sedation she was turned prone and the back was prepped and draped in a standard fashion.  Timeout was taken to confirm patient procedure and all other important data.  Using intraoperative fluoroscopy identified the lateral border of the T12 pedicle and infiltrated this area with quarter percent Marcaine with epinephrine.  Once I had the IV and local sedation I then made a small stab incision and advanced the Jamshidi needle down to the lateral aspect of the T12 pedicle.  I advanced this into the pedicle and then into the vertebral body.  I repeated this exact same procedure on the contralateral side.  Again I advanced the Jamshidi needle using the AP x-ray until I was nearing the medial border of the pedicle.  Once I was nearing the medial border of the pedicle I confirmed that I was beyond the posterior border on the lateral view.  Once both Jamshidi needles were properly positioned I then used my drill and then probed the hole to ensure had a solid bony canal I then placed the inflatable bone tamps and inflated to create my bone void.  I then began inserting the cement to fill the bone void.  I inserted approximately 1-1/2 to 2 cc of cement on either side.  X-rays demonstrate satisfactory  fill in the T12 vertebral body just under the superior endplate.  There was no leak noted.  Once the cement was allowed to harden I remove the Jamshidi needles and then clean the skin.  I reapproximate the skin edges with two 3-0 Monocryl sutures and then skin glue.  Patient was then transferred back to the PACU without incident.  The end of the case all needle sponge counts were correct.  There were no adverse intraoperative events.

## 2019-07-08 NOTE — Discharge Instructions (Signed)
Balloon Kyphoplasty, Care After °This sheet gives you information about how to care for yourself after your procedure. Your health care provider may also give you more specific instructions. If you have problems or questions, contact your health care provider. °What can I expect after the procedure? °After your procedure, it is common to have back pain. °Follow these instructions at home: °Medicines °· Take over-the-counter and prescription medicines only as told by your health care provider. °· Ask your health care provider if the medicine prescribed to you: °? Requires you to avoid driving or using heavy machinery. °? Can cause constipation. You may need to take steps to prevent or treat constipation, such as: °§ Drink enough fluid to keep your urine pale yellow. °§ Take over-the-counter or prescription medicines. °§ Eat foods that are high in fiber, such as beans, whole grains, and fresh fruits and vegetables. °§ Limit foods that are high in fat and processed sugars, such as fried or sweet foods. °Puncture site care ° °· Follow instructions from your health care provider about how to take care of your puncture site. Make sure you: °? Wash your hands with soap and water before and after you change your bandage (dressing). If soap and water are not available, use hand sanitizer. °? Change your dressing as told by your health care provider. °? Leave skin glue or adhesive strips in place. These skin closures may need to be in place for 2 weeks or longer. If adhesive strip edges start to loosen and curl up, you may trim the loose edges. Do not remove adhesive strips completely unless your health care provider tells you to do that. °· Check your puncture site every day for signs of infection. Watch for: °? Redness, swelling, or pain. °? Fluid or blood. °? Warmth. °? Pus or a bad smell. °· Keep your dressing dry until your health care provider says that it can be removed. °Managing pain, stiffness, and swelling ° °· If  directed, put ice on the painful area. °? Put ice in a plastic bag. °? Place a towel between your skin and the bag. °? Leave the ice on for 20 minutes, 2-3 times a day. °Activity °· Rest your back and avoid intense physical activity for as long as told by your health care provider. °· Avoid bending, lifting, or twisting your back for as long as told by your health care provider. °· Return to your normal activities as told by your health care provider. Ask your health care provider what activities are safe for you. °· Do not lift anything that is heavier than 5 lb (2.2 kg). You may need to avoid heavy lifting for several weeks. °General instructions °· Do not use any products that contain nicotine or tobacco, such as cigarettes, e-cigarettes, and chewing tobacco. These can delay bone healing. If you need help quitting, ask your health care provider. °· Do not drive for 24 hours if you were given a sedative during your procedure. °· Keep all follow-up visits as told by your health care provider. This is important. °Contact a health care provider if: °· You have a fever or chills. °· You have redness, swelling, or pain at the site of your puncture. °· You have fluid, blood, or pus coming from the puncture site. °· You have pain that gets worse or does not get better with medicine. °· You develop numbness or weakness in any part of your body. °Get help right away if: °· You have chest pain. °·   You have difficulty breathing. °· You have weakness, numbness, or tingling in your legs. °· You cannot control your bladder or bowel movements. °· You suddenly become weak or numb on one side of your body. °· You become very confused. °· You have trouble speaking or understanding, or both. °Summary °· Follow instructions from your health care provider about how to take care of your puncture site. °· Take over-the-counter and prescription medicines only as told by your health care provider. °· Rest your back and avoid intense  physical activity for as long as told by your health care provider. °· Contact a health care provider if you have pain that gets worse or does not get better with medicine. °· Keep all follow-up visits as told by your health care provider. This is important. °This information is not intended to replace advice given to you by your health care provider. Make sure you discuss any questions you have with your health care provider. °Document Released: 07/27/2015 Document Revised: 10/13/2018 Document Reviewed: 10/13/2018 °Elsevier Patient Education © 2020 [employer] ° °

## 2019-07-08 NOTE — Brief Op Note (Signed)
07/08/2019  5:53 PM  PATIENT:  Kayla Chambers  85 y.o. female  PRE-OPERATIVE DIAGNOSIS:  T12 compression fracture  POST-OPERATIVE DIAGNOSIS:  T12 compression fracture  PROCEDURE:  Procedure(s) with comments: KYPHOPLASTY THORACIC TWELVE (N/A) - 60 mins  SURGEON:  Surgeon(s) and Role:    Melina Schools, MD - Primary  PHYSICIAN ASSISTANT:   ASSISTANTS: none   ANESTHESIA:   local and IV sedation  EBL:  5 mL   BLOOD ADMINISTERED:none  DRAINS: none   LOCAL MEDICATIONS USED:  MARCAINE     SPECIMEN:  No Specimen  DISPOSITION OF SPECIMEN:  N/A  COUNTS:  YES  TOURNIQUET:  * No tourniquets in log *  DICTATION: .Dragon Dictation  PLAN OF CARE: Admit for overnight observation  PATIENT DISPOSITION:  PACU - hemodynamically stable.

## 2019-07-08 NOTE — H&P (Signed)
Addendum H&P  Ms. Dotson is a very pleasant 85 year old man with a recurrent osteoporotic compression fracture.  Patient recently had a kyphoplasty for osteoporotic compression fractures and was doing well until she suffered another fracture.  Patient has an MRI that confirms an acute T12 compression deformity.  The result of the severe pain we elected to return to the OR for a kyphoplasty.  All appropriate risks benefits and alternatives were explained to the patient and her husband and consent was obtained.  There is been no change in her clinical exam since her last office visit of 06/29/2019

## 2019-07-08 NOTE — Anesthesia Preprocedure Evaluation (Addendum)
Anesthesia Evaluation  Patient identified by MRN, date of birth, ID band Patient awake    Reviewed: Allergy & Precautions, NPO status , Patient's Chart, lab work & pertinent test results  History of Anesthesia Complications Negative for: history of anesthetic complications  Airway Mallampati: I  TM Distance: >3 FB Neck ROM: Full    Dental   Pulmonary neg pulmonary ROS,    Pulmonary exam normal        Cardiovascular hypertension, Pt. on medications + CAD and +CHF  Normal cardiovascular exam+ dysrhythmias Atrial Fibrillation + Valvular Problems/Murmurs AI   Aortic regurgitation s/p Bentall procedure in 2014  TTE 2018: mild LVH, EF 60-65%, bioprosthetic aortic valve, mild MR, mild LAE, severe RAE, increased right atrial pressure, severe TR   Neuro/Psych Anxiety Dementia negative neurological ROS     GI/Hepatic negative GI ROS, Neg liver ROS,   Endo/Other  Hypothyroidism   Renal/GU negative Renal ROS     Musculoskeletal  (+) Fibromyalgia -  Abdominal   Peds  Hematology negative hematology ROS (+)   Anesthesia Other Findings Day of surgery medications reviewed with the patient.  Reproductive/Obstetrics                           Anesthesia Physical Anesthesia Plan  ASA: III  Anesthesia Plan: MAC   Post-op Pain Management:    Induction: Intravenous  PONV Risk Score and Plan: 2 and Treatment may vary due to age or medical condition and Ondansetron  Airway Management Planned: Simple Face Mask  Additional Equipment: None  Intra-op Plan:   Post-operative Plan:   Informed Consent: I have reviewed the patients History and Physical, chart, labs and discussed the procedure including the risks, benefits and alternatives for the proposed anesthesia with the patient or authorized representative who has indicated his/her understanding and acceptance.       Plan Discussed with: CRNA and  Surgeon  Anesthesia Plan Comments:        Anesthesia Quick Evaluation

## 2019-07-09 ENCOUNTER — Encounter (HOSPITAL_COMMUNITY): Payer: Self-pay | Admitting: Orthopedic Surgery

## 2019-07-09 DIAGNOSIS — M353 Polymyalgia rheumatica: Secondary | ICD-10-CM | POA: Diagnosis not present

## 2019-07-09 DIAGNOSIS — I480 Paroxysmal atrial fibrillation: Secondary | ICD-10-CM | POA: Diagnosis not present

## 2019-07-09 DIAGNOSIS — I5032 Chronic diastolic (congestive) heart failure: Secondary | ICD-10-CM | POA: Diagnosis not present

## 2019-07-09 DIAGNOSIS — M8008XA Age-related osteoporosis with current pathological fracture, vertebra(e), initial encounter for fracture: Secondary | ICD-10-CM | POA: Diagnosis not present

## 2019-07-09 DIAGNOSIS — I110 Hypertensive heart disease with heart failure: Secondary | ICD-10-CM | POA: Diagnosis not present

## 2019-07-09 DIAGNOSIS — I671 Cerebral aneurysm, nonruptured: Secondary | ICD-10-CM | POA: Diagnosis not present

## 2019-07-09 NOTE — Care Management CC44 (Signed)
Condition Code 44 Documentation Completed  Patient Details  Name: Kayla Chambers MRN: [id number] Date of Birth: 29-Sep-1927   Condition Code 44 given:  Yes Patient signature on Condition Code 44 notice:  Yes Documentation of 2 MD's agreement:  Yes Code 44 added to claim:  Yes    Sharin Mons, RN 07/09/2019, 10:02 AM

## 2019-07-09 NOTE — Progress Notes (Signed)
Patient is discharged from room 3C06 at this time. Alert and in stable condition. IV site d/c'[d and instructions read to patient and spouse with understanding verbalized. Left unit via wheelchair with all belongings at side. 

## 2019-07-09 NOTE — Progress Notes (Signed)
This 85 y/o female presents with the above. PTA pt reports she is mod independent with functional mobility using RW, reports spouse assist with ADL tasks. Pt currently with limitations due to pain, weakness, decreased standing balance. Pt requiring minA for functional mobility using RW; light minA for seated UB ADL and modA for LB ADL. Pt requires cues for recall of back precautions and for carryover of precautions during functional tasks. Further educated on precautions, safety and compensatory techniques for completing ADL and functional transfers. Pt reports she has good support from spouse who is able to assist with ADL/iADL needs. She will benefit from continued OT services while in acute setting to maximize her safety and independence with ADL and mobility prior to return home. Will follow.     07/09/19 1016  OT Visit Information  Last OT Received On 07/09/19  Assistance Needed +1  Precautions  Precautions Fall;Back  Precaution Booklet Issued Yes (comment)  Precaution Comments pt unable to recall 3/3 back precautions start of session, able to recall end of session when cued  Required Braces or Orthoses  ("no brace needed" order)  Restrictions  Weight Bearing Restrictions No  Home Living  Family/patient expects to be discharged to: Private residence  Living Arrangements Spouse/significant other;Children  Available Help at Discharge Family;Available 24 hours/day  Type of Home House  Home Access Stairs to enter  Entrance Stairs-Number of Steps 2  Entrance Stairs-Rails Left  Home Layout Able to live on main level with bedroom/bathroom;Bed/bath upstairs  Bathroom Higher education careers adviser - 2 wheels;BSC;Tub bench;Cane - quad  Prior Function  Level of Independence Needs assistance  Gait / Transfers Assistance Needed has been using RW recently  ADL's / [place] pt initially reports she dresses herself and spouse  assists with bathing, later in session pt reports spouse assists with LB dressing  Communication  Communication HOH  Pain Assessment  Pain Assessment Faces  Faces Pain Scale 4  Pain Location back, incisional  Cognition  Arousal/Alertness Awake/alert  Behavior During Therapy WFL for tasks assessed/performed  Overall Cognitive Status No family/caregiver present to determine baseline cognitive functioning  General Comments pt with decreased recall of precautions and some confusion noted when educating pt in back precautions, suspect partly due to pt being [facility]  Upper Extremity Assessment  Upper Extremity Assessment Generalized weakness  Lower Extremity Assessment  Lower Extremity Assessment Defer to PT evaluation  Cervical / Trunk Assessment  Cervical / Trunk Assessment Other exceptions  Cervical / Trunk Exceptions s/p spinal surgery  ADL  Overall ADL's  Needs assistance/impaired  Eating/Feeding Independent  Grooming Set up;Min guard;Sitting  Grooming Details (indicate cue type and reason) pt reports she typically performs in sitting at home  Upper Body Bathing Set up;Sitting;Supervision/ safety  Lower Body Bathing Moderate assistance;Sitting/lateral leans;Sit to/from stand  Lower Body Bathing Details (indicate cue type and reason) due to need to adhere to back precautions  Upper Body Dressing  Minimal assistance;Sitting  Upper Body Dressing Details (indicate cue type and reason) assist for clasping bra, pt donning overhead shirt with setup assist  Lower Body Dressing Sit to/from stand;Moderate assistance  Toilet Transfer Minimal assistance;RW;Ambulation  Toilet Transfer Details (indicate cue type and reason) simulated via transfer to/from EOB, room level mobility  Toileting- Clothing Manipulation and Hygiene Minimal assistance;Sit to/from stand;Sitting/lateral lean  Functional mobility during ADLs Minimal assistance;Rolling walker  Bed Mobility  Overal bed mobility Needs Assistance   Bed Mobility Rolling;Sidelying to Sit;Sit to Sidelying  Rolling Modified independent (Device/Increase time)  Sidelying to sit Min guard  Sit to sidelying Min guard  General bed mobility comments close guarding for safety, use of bedrail and cues for proper log roll technique especially when returning to supine  Transfers  Overall transfer level Needs assistance  Equipment used Rolling walker (2 wheeled)  Transfers Sit to/from Stand  Sit to Stand Min assist  General transfer comment assist for power up to full standing from EOB  Balance  Overall balance assessment Needs assistance  Sitting-balance support Feet supported  Sitting balance-Leahy Scale Poor  Sitting balance - Comments reliant on BUE support, pt reports due to pain  Standing balance support Bilateral upper extremity supported  Standing balance-Leahy Scale Poor  Standing balance comment reliant on UE/external support  OT - End of Session  Equipment Utilized During Treatment Rolling walker;Gait belt  Activity Tolerance Patient tolerated treatment well  Patient left with call bell/phone within reach;in bed  Nurse Communication Mobility status  OT Assessment  OT Recommendation/Assessment Patient needs continued OT Services  OT Visit Diagnosis Unsteadiness on feet (R26.81);Muscle weakness (generalized) (M62.81);Pain  Pain - part of body  (back)  OT Problem List Decreased strength;Decreased activity tolerance;Impaired balance (sitting and/or standing);Decreased safety awareness;Decreased knowledge of use of DME or AE;Decreased knowledge of precautions;Pain  OT Plan  OT Frequency (ACUTE ONLY) Min 2X/week  OT Treatment/Interventions (ACUTE ONLY) Self-care/ADL training;Therapeutic exercise;Neuromuscular education;DME and/or AE instruction;Energy conservation;Manual therapy;Modalities;Cognitive remediation/compensation;Therapeutic activities;Patient/family education;Balance training  AM-PAC OT "6 Clicks" Daily Activity Outcome  Measure (Version 2)  Help from another person eating meals? 4  Help from another person taking care of personal grooming? 3  Help from another person toileting, which includes using toliet, bedpan, or urinal? 2  Help from another person bathing (including washing, rinsing, drying)? 2  Help from another person to put on and taking off regular upper body clothing? 3  Help from another person to put on and taking off regular lower body clothing? 2  6 Click Score 16  OT Recommendation  Follow Up Recommendations No OT follow up;Supervision/Assistance - 24 hour  OT Equipment None recommended by OT (pt's DME needs are met)  Individuals Consulted  Consulted and Agree with Results and Recommendations Patient  Acute Rehab OT Goals  Patient Stated Goal to go home today  OT Goal Formulation With patient  Time For Goal Achievement 07/23/19  Potential to Achieve Goals Good  OT Time Calculation  OT Start Time (ACUTE ONLY) 0910  OT Stop Time (ACUTE ONLY) 0936  OT Time Calculation (min) 26 min  OT General Charges  $OT Visit 1 Visit  OT Evaluation  $OT Eval Low Complexity 1 Low  OT Treatments  $Self Care/Home Management  8-22 mins  Written Expression  Dominant Hand Right   Lou Cal, [place] Pager [phone] Office [phone]

## 2019-07-09 NOTE — Care Management Obs Status (Signed)
MEDICARE OBSERVATION STATUS NOTIFICATION   Patient Details  Name: Kayla Chambers MRN: [id number] Date of Birth: Jan 23, 1927   Medicare Observation Status Notification Given:  Yes    Sharin Mons, RN 07/09/2019, 10:01 AM

## 2019-07-09 NOTE — Progress Notes (Signed)
Subjective: 1 Day Post-Op Procedure(s) (LRB): KYPHOPLASTY THORACIC TWELVE (N/A) Patient reports pain as mild.  C/o neck pain only, back pain improved Tolerating PO w/o N/V +void +flatus -BM Denies SOB, CP, HA, calf pain  Objective: Vital signs in last 24 hours: Temp:  [97.7 F (36.5 C)-98.6 F (37 C)] 97.7 F (36.5 C) (08/20 0441) Pulse Rate:  [59-78] 60 (08/20 0441) Resp:  [14-18] 18 (08/20 0441) BP: (107-189)/(56-90) 152/84 (08/20 0441) SpO2:  [97 %-100 %] 99 % (08/20 0441) Weight:  [57.2 kg] 57.2 kg (08/19 1331)  Intake/Output from previous day: 08/19 0701 - 08/20 0700 In: 440 [P.O.:240; I.V.:200] Out: 5 [Blood:5] Intake/Output this shift: No intake/output data recorded.  Recent Labs    07/08/19 1320  HGB 14.4   Recent Labs    07/08/19 1320  WBC 6.4  RBC 4.54  HCT 43.3  PLT 160   Recent Labs    07/08/19 1320  NA 138  K 4.0  CL 103  CO2 25  BUN 9  CREATININE 0.65  GLUCOSE 93  CALCIUM 9.5   Recent Labs    07/08/19 1320  INR 1.0    Neurologically intact ABD soft Neurovascular intact Sensation intact distally Intact pulses distally Dorsiflexion/Plantar flexion intact Incision: dressing C/D/I Compartment soft   Assessment/Plan: 1 Day Post-Op Procedure(s) (LRB): KYPHOPLASTY THORACIC TWELVE (N/A) Advance diet Up with therapy DVT PPx: Teds, SCDs, ambulation Encourage IS Plan D/C home today  Durward Parcel 07/09/2019, 7:39 AM

## 2019-07-09 NOTE — Anesthesia Postprocedure Evaluation (Signed)
Anesthesia Post Note  Patient: RANETTE LUCKADOO  Procedure(s) Performed: KYPHOPLASTY THORACIC TWELVE (N/A Spine Thoracic)     Patient location during evaluation: PACU Anesthesia Type: MAC Level of consciousness: awake and alert Pain management: pain level controlled Vital Signs Assessment: post-procedure vital signs reviewed and stable Respiratory status: spontaneous breathing, nonlabored ventilation, respiratory function stable and patient connected to nasal cannula oxygen Cardiovascular status: stable and blood pressure returned to baseline Postop Assessment: no apparent nausea or vomiting Anesthetic complications: no    Last Vitals:  Vitals:   07/09/19 0441 07/09/19 0806  BP: (!) 152/84 102/89  Pulse: 60 63  Resp: 18 17  Temp: 36.5 C 36.6 C  SpO2: 99% 98%    Last Pain:  Vitals:   07/09/19 0806  TempSrc: Oral  PainSc:                  Shelby Peltz DAVID

## 2019-07-09 NOTE — Evaluation (Signed)
Physical Therapy Evaluation Patient Details Name: Kayla Chambers MRN: [id number] DOB: 07/08/1927 Today's Date: 07/09/2019   History of Present Illness  85 year old adm 05/14/19 for T11 kyphoplasty PMH-anxiety, CAD, afib, CHF, dementia, fibromyalgia, neuropathy, kyphoplasty L3  Clinical Impression  Pt admitted with above diagnosis. At the time of PT eval pt was able to perform transfers and ambulation with gross min guard assist to min assist for balance support, safety, and maintenance of back precautions with RW use. Pt moving slow and with decreased tolerance for functional activity overall. It appears that pt is near her baseline of function aside from added surgical pain. Pt reports that husband will be present at home to continue assisting pt as he does at baseline with ADL's and mobility. Pt was educated on appropriate activity progression, car transfer, and safety. Pt currently with functional limitations due to the deficits listed below (see PT Problem List). Pt will benefit from skilled PT to increase their independence and safety with mobility to allow discharge to the venue listed below.       Follow Up Recommendations No PT follow up;Supervision for mobility/OOB    Equipment Recommendations  None recommended by PT    Recommendations for Other Services       Precautions / Restrictions Precautions Precautions: Fall;Back Precaution Booklet Issued: Yes (comment) Precaution Comments: pt unable to recall 3/3 back precautions start of session, able to recall end of session when cued Required Braces or Orthoses: ("no brace needed" order) Restrictions Weight Bearing Restrictions: No      Mobility  Bed Mobility Overal bed mobility: Needs Assistance Bed Mobility: Rolling;Sidelying to Sit Rolling: Modified independent (Device/Increase time) Sidelying to sit: Min guard       General bed mobility comments: Hands on guarding initially at shoulders to achieve full sitting  position.   Transfers Overall transfer level: Needs assistance Equipment used: Rolling walker (2 wheeled) Transfers: Sit to/from Stand Sit to Stand: Min assist         General transfer comment: Assist for power-up to full standing position. Less from toilet as pt pulling up from bar in bathroom.   Ambulation/Gait Ambulation/Gait assistance: Min guard;Min assist Gait Distance (Feet): 100 Feet Assistive device: Rolling walker (2 wheeled) Gait Pattern/deviations: Step-through pattern;Decreased stride length;Trunk flexed Gait velocity: Decreased Gait velocity interpretation: <1.31 ft/sec, indicative of household ambulator General Gait Details: Occasional assist provided for balance support with RW. Frequent cues for improved posture and safety.   Stairs Stairs: Yes Stairs assistance: Min assist Stair Management: Two rails;Step to pattern;Forwards Number of Stairs: 3 General stair comments: VC's for sequencing and general safety with stair negotiation.   Wheelchair Mobility    Modified Rankin (Stroke Patients Only)       Balance Overall balance assessment: Needs assistance Sitting-balance support: Feet supported;No upper extremity supported Sitting balance-Leahy Scale: Poor Sitting balance - Comments: Difficulty sitting EOB without BUE support intially.  Postural control: Posterior lean Standing balance support: Bilateral upper extremity supported Standing balance-Leahy Scale: Poor Standing balance comment: Reliant on external support                             Pertinent Vitals/Pain Pain Assessment: Faces Faces Pain Scale: Hurts even more Pain Location: back, incisional Pain Descriptors / Indicators: Discomfort Pain Intervention(s): Monitored during session;Repositioned;Limited activity within patient's tolerance    Home Living Family/patient expects to be discharged to:: Private residence Living Arrangements: Spouse/significant  other;Children Available Help at Discharge: Family;Available 24 hours/day  Type of Home: House Home Access: Stairs to enter Entrance Stairs-Rails: Left Entrance Stairs-Number of Steps: 2 Home Layout: Able to live on main level with bedroom/bathroom;Bed/bath upstairs Home Equipment: Walker - 2 wheels;Bedside commode;Tub bench;Cane - quad      Prior Function Level of Independence: Needs assistance   Gait / Transfers Assistance Needed: has been using RW recently  ADL's / Homemaking Assistance Needed: pt initially reports she dresses herself and spouse assists with bathing, later in session pt reports spouse assists with LB dressing        Hand Dominance   Dominant Hand: Right    Extremity/Trunk Assessment   Upper Extremity Assessment Upper Extremity Assessment: Generalized weakness    Lower Extremity Assessment Lower Extremity Assessment: Generalized weakness    Cervical / Trunk Assessment Cervical / Trunk Assessment: Other exceptions Cervical / Trunk Exceptions: s/p spinal surgery  Communication   Communication: HOH  Cognition Arousal/Alertness: Awake/alert Behavior During Therapy: WFL for tasks assessed/performed Overall Cognitive Status: No family/caregiver present to determine baseline cognitive functioning                                 General Comments: pt with decreased recall of precautions and some confusion noted when educating pt in back precautions, suspect partly due to pt being [facility]      General Comments      Exercises     Assessment/Plan    PT Assessment Patent does not need any further PT services  PT Problem List Decreased strength;Decreased balance;Decreased mobility;Decreased knowledge of use of DME;Pain       PT Treatment Interventions      PT Goals (Current goals can be found in the Care Plan section)  Acute Rehab PT Goals Patient Stated Goal: to go home today PT Goal Formulation: With patient Time For Goal Achievement:  07/16/19 Potential to Achieve Goals: Good    Frequency     Barriers to discharge        Co-evaluation               AM-PAC PT "6 Clicks" Mobility  Outcome Measure Help needed turning from your back to your side while in a flat bed without using bedrails?: A Little Help needed moving from lying on your back to sitting on the side of a flat bed without using bedrails?: A Little Help needed moving to and from a bed to a chair (including a wheelchair)?: A Little Help needed standing up from a chair using your arms (e.g., wheelchair or bedside chair)?: A Little Help needed to walk in hospital room?: A Little Help needed climbing 3-5 steps with a railing? : A Little 6 Click Score: 18    End of Session Equipment Utilized During Treatment: Gait belt Activity Tolerance: Patient tolerated treatment well Patient left: in chair;with call bell/phone within reach Nurse Communication: Mobility status PT Visit Diagnosis: Difficulty in walking, not elsewhere classified (R26.2);Pain Pain - part of body: (back)    Time: 2355-7322 PT Time Calculation (min) (ACUTE ONLY): 28 min   Charges:   PT Evaluation $PT Eval Moderate Complexity: 1 Mod PT Treatments $Gait Training: 8-22 mins        Rolinda Roan, PT, DPT Acute Rehabilitation Services Pager: [phone] Office: [phone]   Thelma Comp 07/09/2019, 10:35 AM

## 2019-07-14 NOTE — Discharge Summary (Signed)
Patient ID: Kayla Chambers MRN: [id number] DOB/AGE: 05/02/1927 85 y.o.  Admit date: 07/08/2019 Discharge date: 07/14/2019  Admission Diagnoses:  Active Problems:   Thoracic compression fracture [facility])   Discharge Diagnoses:  Active Problems:   Thoracic compression fracture (HCC)  status post Procedure(s): KYPHOPLASTY THORACIC TWELVE  Past Medical History:  Diagnosis Date  . Anxiety    takes xanax for sleep.   . Aortic insufficiency    a. 03/2013 s/p  AVR w/ biologic 68mm Magna Ease peric tissue valve, model 300TFX, ser# [id number].   . Back pain    BACK BRACE/FRACTURE  . CAD (coronary artery disease)    a. 01/2013 mod calcification w/o sev dzs;  b. 03/2013 CABG x1 VG->RCA @ time of AVR  . Chronic diastolic CHF (congestive heart failure) ([place])    a. 01/2013 echo: EF 50-55%.  . Compression fracture of body of thoracic vertebra (HCC)    T-11  . Dementia ([place])   . Dilated aortic root ([place])    a. 03/2013 s/p Bentall procedure with Ao Root replacement (59mm Valsalva graft) w/ reimplantationof the R and L coronary ostium.  Marland Kitchen Dysrhythmia    A. FIB  . Fibromyalgia   . Headache(784.0)    hx migraines  . Hypothyroidism   . Intracranial aneurysm    in late 1970's  . Malignant neoplasm of connective and soft tissue ([place]) 01/08/2012   Overview:  11 cm, low grade, resection with negative margins 12/30/09. Neoadjuvant RT by Dr. Yisroel Ramming. Surveillance plan: MRI 12/08/11 shows changes in presumed post-op seroma/hematoma with slight increase in size - follow up MRI 11/18/12 showed same size. Will continue with q3 month MRI. CT C/A/P annually alternating with CXR at 6 months.   . Osteoporosis   . Paroxysmal A-fib ([place])    a. Dx 01/2013 - placed on xarelto;  b. 03/2013 s/p left sided MAZE and LA clip @ time of AVR/CABG.  . Peripheral neuropathy   . PMR (polymyalgia rheumatica) (HCC)    Inactive  . Rheumatic heart disease   . 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 Vertigo     Surgeries: Procedure(s): KYPHOPLASTY THORACIC TWELVE on 07/08/2019   Consultants:   Discharged Condition: Improved  Hospital Course: Kayla Chambers is an 85 y.o. female who was admitted 07/08/2019 for operative treatment of T12 compression fraction. Patient failed conservative treatments (please see the history and physical for the specifics) and had severe unremitting pain that affects sleep, daily activities and work/hobbies. After pre-op clearance, the patient was taken to the operating room on 07/08/2019 and underwent  Procedure(s): KYPHOPLASTY THORACIC TWELVE.    Patient was given perioperative antibiotics:  Anti-infectives (From admission, onward)   Start     Dose/Rate Route Frequency Ordered Stop   07/08/19 1900  ceFAZolin (ANCEF) IVPB 2g/100 mL premix     2 g 200 mL/hr over 30 Minutes Intravenous Every 8 hours 07/08/19 1850 07/09/19 1144   07/08/19 1247  ceFAZolin (ANCEF) IVPB 2g/100 mL premix     2 g 200 mL/hr over 30 Minutes Intravenous 30 min pre-op 07/08/19 1247 07/08/19 1641       Patient was given sequential compression devices and early ambulation to prevent DVT.   Patient benefited maximally from hospital stay and there were no complications. At the time of discharge, the patient was urinating/moving their bowels without difficulty, tolerating a regular diet, pain is controlled with oral pain medications and they have  been cleared by PT/OT.   Recent vital signs: No data found.   Recent laboratory studies: No results for input(s): WBC, HGB, HCT, PLT, NA, K, CL, CO2, BUN, CREATININE, GLUCOSE, INR, CALCIUM in the last 72 hours.  Invalid input(s): PT, 2   Discharge Medications:   Allergies as of 07/09/2019      Reactions   Alendronate Sodium Nausea And Vomiting   Ambien [zolpidem Tartrate] Other (See Comments)   hallucinations   Lac Bovis Nausea Only   Drinks soy milk   Lyrica [pregabalin] Nausea And Vomiting   Milk-related Compounds  Diarrhea   Pt can have foods that have milk in it, she just can't drink milk   Neurontin [gabapentin] Nausea And Vomiting   Procaine Hcl Other (See Comments)   Reaction unknown   Toprol Xl [metoprolol Succinate] Other (See Comments)   Does not remember. Tolerates Lopressor 04/10/13, [name]      Medication List    STOP taking these medications   acetaminophen-codeine 300-30 MG tablet Commonly known as: TYLENOL #3   calcitonin (salmon) 200 UNIT/ACT nasal spray Commonly known as: MIACALCIN/FORTICAL   CRANBERRY PO   Eliquis 2.5 MG Tabs tablet Generic drug: apixaban   HYDROcodone-acetaminophen 5-325 MG tablet Commonly known as: NORCO/VICODIN   multivitamin with minerals Tabs tablet   potassium chloride SA 20 MEQ tablet Commonly known as: K-DUR   PROBIOTIC PO   vitamin C 1000 MG tablet     TAKE these medications   acetaminophen 500 MG tablet Commonly known as: TYLENOL Take 2 tablets (1,000 mg total) by mouth every 6 (six) hours as needed for mild pain (pain.).   ALPRAZolam 0.5 MG tablet Commonly known as: XANAX TAKE 1 TABLET BY MOUTH EVERY DAY What changed: when to take this   amiodarone 200 MG tablet Commonly known as: PACERONE TAKE 1 TABLET BY MOUTH TWICE A DAY What changed: when to take this   furosemide 40 MG tablet Commonly known as: LASIX TAKE 1 TABLET BY MOUTH TWICE DAILY FOR 2 DAYS THEN TAKE 1 TABLET BY MOUTH EVERY DAY What changed: See the new instructions.   lactase 3000 units tablet Commonly known as: LACTAID Take 1 tablet by mouth daily as needed (only when eating dairy).   levothyroxine 75 MCG tablet Commonly known as: SYNTHROID Take 75 mcg by mouth at bedtime.   loperamide 2 MG tablet Commonly known as: IMODIUM A-D Take 2 mg by mouth 4 (four) times daily as needed for diarrhea or loose stools.   polyethylene glycol 17 g packet Commonly known as: MIRALAX / GLYCOLAX Take 17 g by mouth daily.   pravastatin 40 MG tablet Commonly known as:  PRAVACHOL TAKE 1 TABLET BY MOUTH EVERY EVENING What changed: when to take this   rOPINIRole 0.25 MG tablet Commonly known as: REQUIP Take 0.25 mg by mouth at bedtime.     ASK your doctor about these medications   methocarbamol 500 MG tablet Commonly known as: Robaxin Take 1 tablet (500 mg total) by mouth every 8 (eight) hours as needed for up to 5 days for muscle spasms. Ask about: Should I take this medication?   ondansetron 4 MG tablet Commonly known as: Zofran Take 1 tablet (4 mg total) by mouth every 8 (eight) hours as needed for up to 5 days for nausea or vomiting. Ask about: Should I take this medication?   traMADol 50 MG tablet Commonly known as: Ultram Take 1 tablet (50 mg total) by mouth every 6 (six) hours as needed  for up to 5 days for moderate pain. Ask about: Should I take this medication?       Diagnostic Studies: Dg Chest 2 View  Result Date: 07/08/2019 CLINICAL DATA:  Preop EXAM: CHEST - 2 VIEW COMPARISON:  None. FINDINGS: Prior median sternotomy, CABG and valve replacement. There is hyperinflation of the lungs compatible with COPD. Heart and mediastinal contours are within normal limits. No focal opacities or effusions. Changes of multilevel vertebroplasty in the lower thoracic and upper lumbar spine. Moderate compression deformity below the thoracic vertebroplasty level, possibly lower thoracic or upper lumbar spine. This was not present on prior study. IMPRESSION: COPD. New moderate compression fracture in the lower thoracic or upper lumbar spine, below the previous thoracic vertebroplasty level. Electronically Signed   By: Rolm Baptise M.D.   On: 07/08/2019 13:33   Dg Lumbar Spine 2-3 Views  Result Date: 07/09/2019 CLINICAL DATA:  Thoracic kyphoplasty EXAM: LUMBAR SPINE - 2-3 VIEW; DG C-ARM 61-120 MIN COMPARISON:  05/14/2019 FINDINGS: Three intraoperative spot images demonstrate multiple levels of vertebral augmentation in the lower thoracic and upper to mid  lumbar spine on the lateral view. No visible complicating feature. IMPRESSION: Vertebral augmentation.  No visible complicating feature. Electronically Signed   By: Rolm Baptise M.D.   On: 07/09/2019 09:53   Dg C-arm 1-60 Min  Result Date: 07/09/2019 CLINICAL DATA:  Thoracic kyphoplasty EXAM: LUMBAR SPINE - 2-3 VIEW; DG C-ARM 61-120 MIN COMPARISON:  05/14/2019 FINDINGS: Three intraoperative spot images demonstrate multiple levels of vertebral augmentation in the lower thoracic and upper to mid lumbar spine on the lateral view. No visible complicating feature. IMPRESSION: Vertebral augmentation.  No visible complicating feature. Electronically Signed   By: Rolm Baptise M.D.   On: 07/09/2019 09:53    Discharge Instructions    Incentive spirometry RT   Complete by: As directed         Discharge Plan:  discharge to home  Disposition: stable    Signed: Yvonne Kendall Ward for [facility] PA-C [facility] [phone] 07/14/2019, 8:02 AM

## 2019-07-15 ENCOUNTER — Other Ambulatory Visit: Payer: Self-pay | Admitting: Cardiology

## 2019-07-15 IMAGING — MR MR LUMBAR SPINE W/O CM
4 of 5 series · 17 of 48 positions shown · non-contrast
Comparison: Prior CT and radiograph from earlier same day.

CLINICAL DATA: Initial evaluation for acute trauma, fall. Evaluate
for possible fracture.

EXAM:
MRI LUMBAR SPINE WITHOUT CONTRAST
TECHNIQUE: Multiplanar, multisequence MR imaging of the lumbar spine was
performed. No intravenous contrast was administered.

[Series 3: T1 · sagittal · 4.0mm · 0.51mm/px · 3 of 14 slices shown (1 of 2)]
[im 3/14]
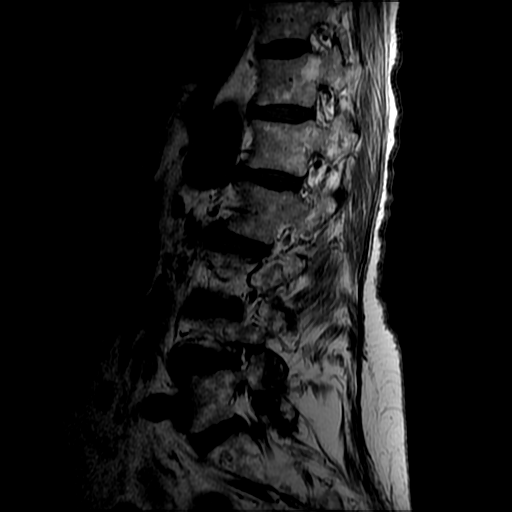
[im 8/14]
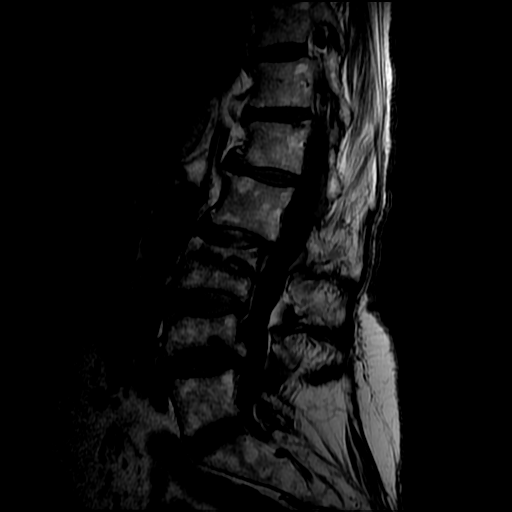
[im 14/14]
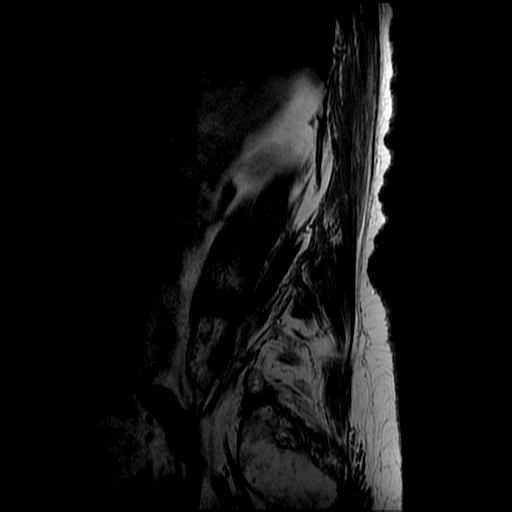

[Series 4: T2 · sagittal · 4.0mm · 0.51mm/px · 6 of 14 slices shown (1 of 2)]
[im 1/14]
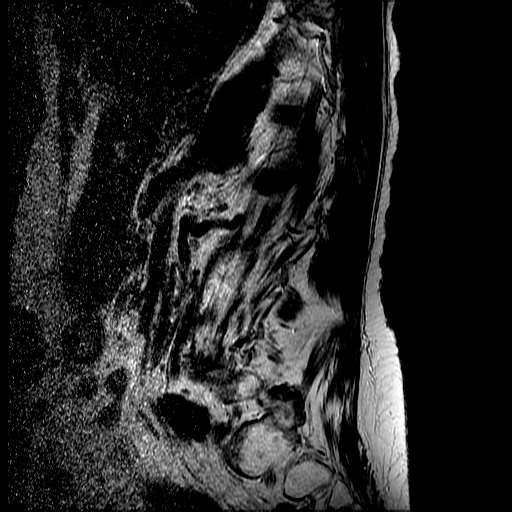
[im 3/14]
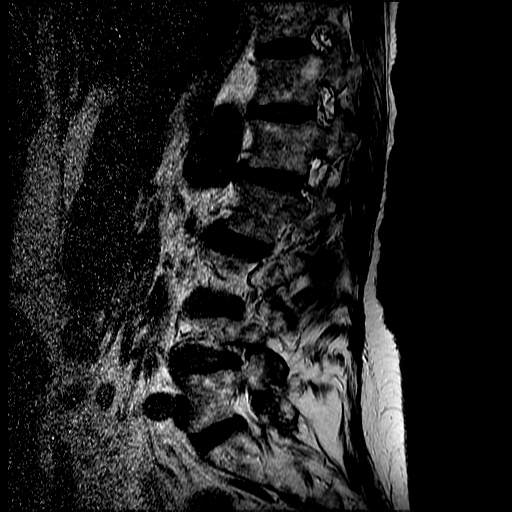
[im 6/14]
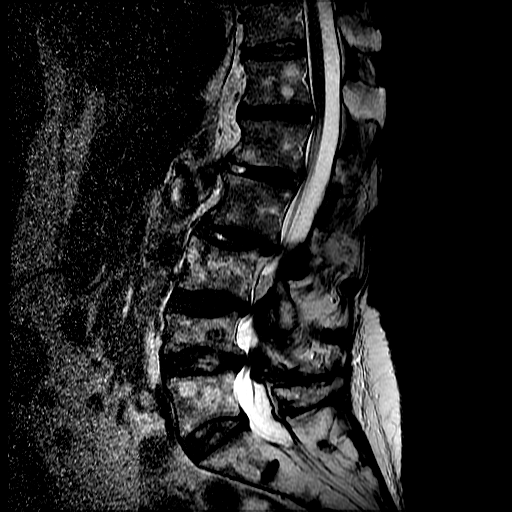
[im 8/14]
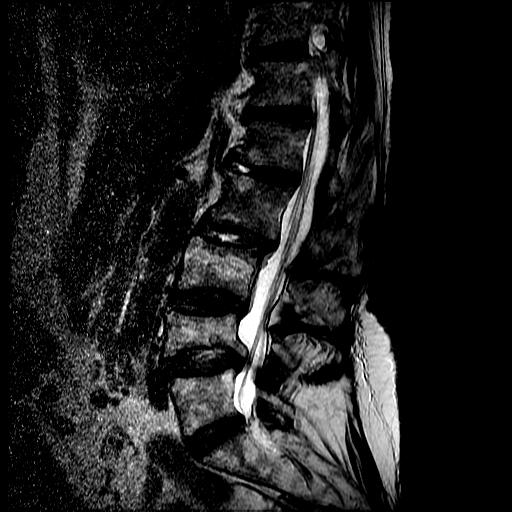
[im 11/14]
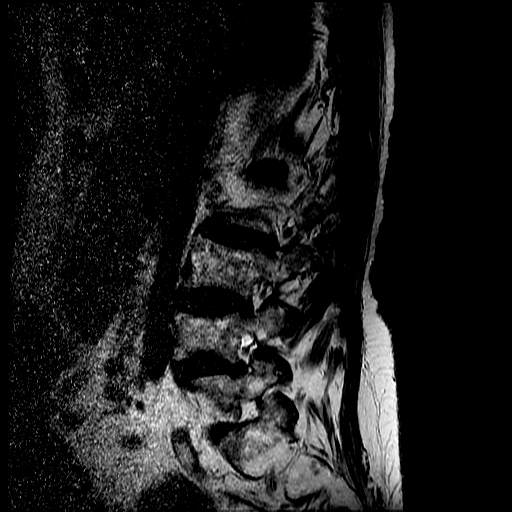
[im 14/14]
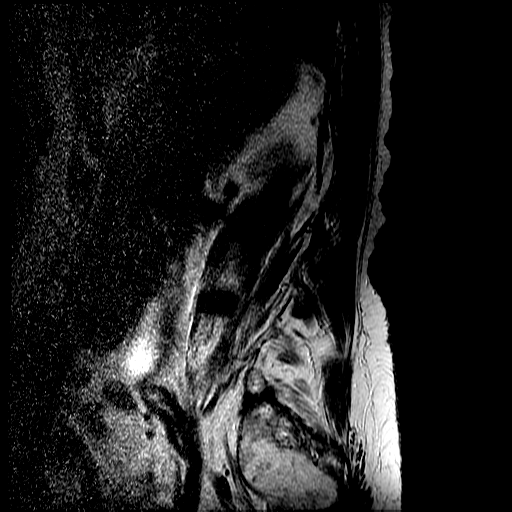

[Series 6: T2 · axial · 4.0mm · 0.39mm/px · z∈[-48,+97]mm · 5 of 35 slices shown (2 of 2)]
[im 1/35]
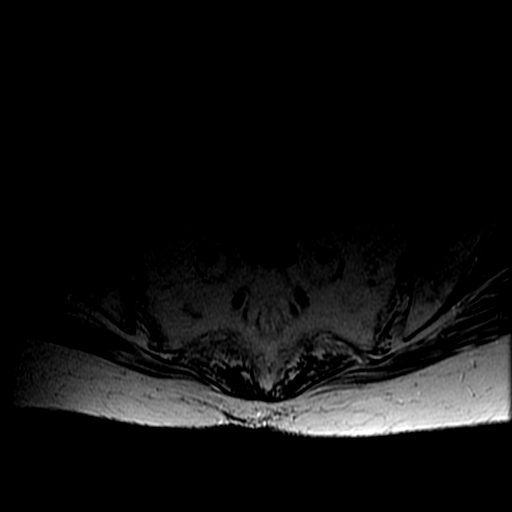
[im 5/35]
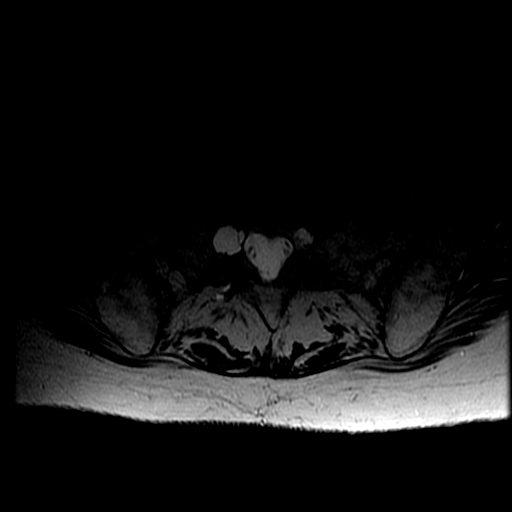
[im 10/35]
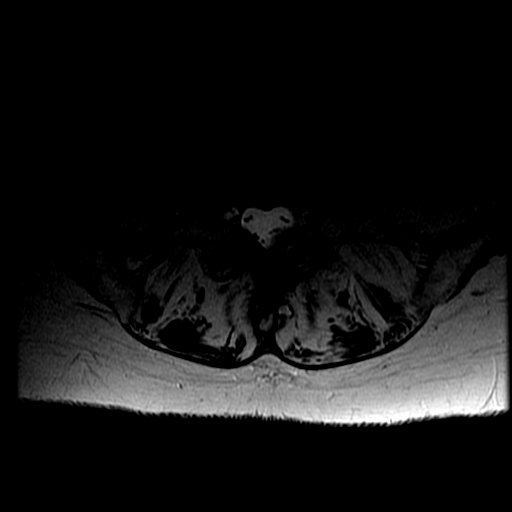
[im 18/35]
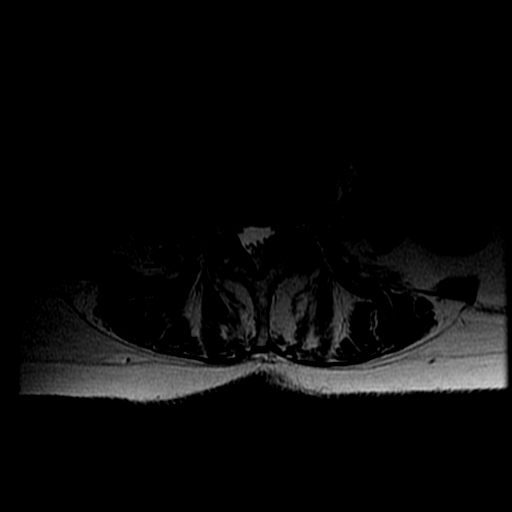
[im 30/35]
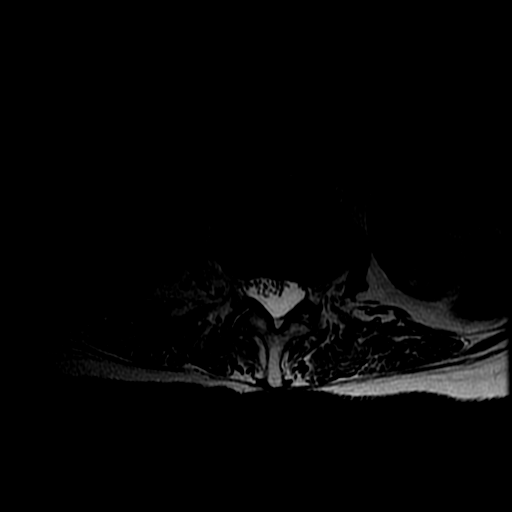

[Series 7: T1 · axial · 4.0mm · 0.39mm/px · z∈[-28,+97]mm · 3 of 35 slices shown (2 of 2)]
[im 5/35]
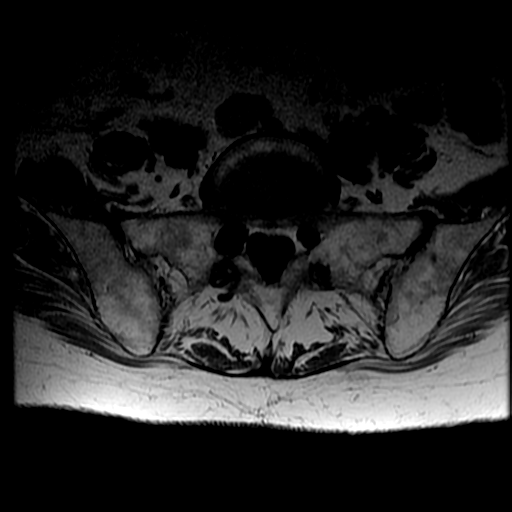
[im 18/35]
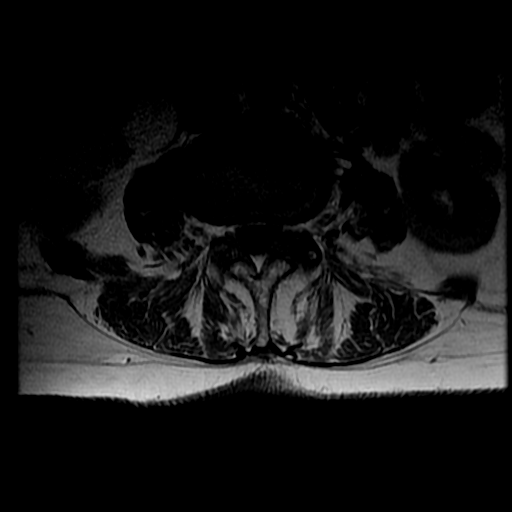
[im 30/35]
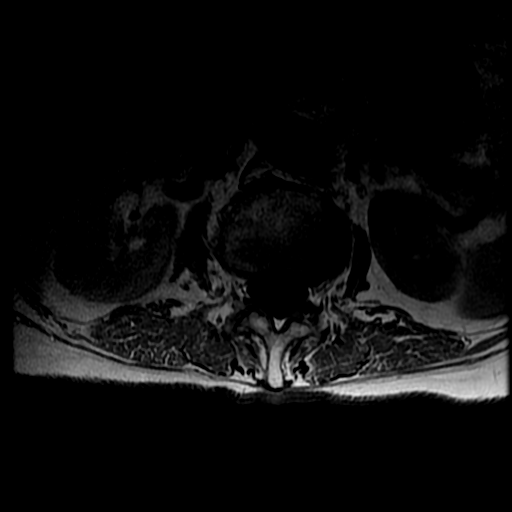

[17 of 48 positions shown; findings below may reference images not displayed]

FINDINGS: Segmentation: Normal segmentation. Lowest well-formed disc labeled
the L5-S1 level.

Alignment: Mild dextroscoliosis. 6 mm retrolisthesis of L2 on L3,
with trace 2-3 mm retrolisthesis of L3 on L4 and L4 on L5.

Vertebrae: Linear T1 hypointense, T2/STIR hyperintense signal
intensity extending through the mid/upper aspect of the L3 vertebral
body, consistent with acute compression fracture. Mild central
height loss of 30% without significant bony retropulsion. This is
benign/mechanical in appearance with no underlying pathologic
lesion.

Compression deformity involving the L4 vertebral body age is chronic
in appearance, relatively similar as compared to previous CT from
01/02/2016. Mild edema within the L4 vertebral body felt to be
related to prominent inferior endplate Schmorl's node.

Vertebral body heights otherwise maintained without evidence for
acute or chronic fracture. Trace edema within the S3 segment felt to
be either degenerative in nature or possibly related to atypical
hemangioma. Underlying bone marrow signal intensity diffusely
heterogeneous without worrisome osseous mass.

Conus medullaris and cauda equina: Conus extends to the T12-L1
level. Conus and cauda equina appear normal.

Paraspinal and other soft tissues: Paraspinous soft tissues
demonstrate no acute abnormality. Chronic atrophy noted within the
lower paraspinous musculature. Subcentimeter T2 hyperintense cyst
noted within the posterior left kidney. Visualized visceral
structures otherwise unremarkable.

Disc levels:

L1-2: Mild diffuse disc bulge. Superimposed tiny left paracentral
disc extrusion with superior migration. No significant canal or
foraminal stenosis.

L2-3: 6 mm retrolisthesis. Mild diffuse disc bulge. Moderate facet
and ligament flavum hypertrophy. Resultant moderate left with mild
right lateral recess narrowing without significant canal stenosis.
Foramina remain patent.

L3-4: Trace retrolisthesis. Diffuse disc bulge. Moderate facet and
ligamentum flavum hypertrophy, slightly worse on the left. Resultant
mild left lateral recess narrowing without significant canal
stenosis. Mild left L3 foraminal stenosis.

L4-5: 5 mm bony retropulsion related to the chronic L4 compression
fracture. Mild diffuse disc bulge. Reactive endplate changes with
endplate osteophytic spurring. Moderate to advanced facet and
ligament flavum hypertrophy. Resultant moderate canal with bilateral
subarticular stenosis. Moderate left with mild right L4 foraminal
stenosis.

L5-S1: Negative interspace. Mild to moderate facet hypertrophy,
slightly worse on the left. No stenosis.
IMPRESSION: 1. Acute compression fracture involving the upper/central L3
vertebral body with mild central height loss of up to 30% without
bony retropulsion.
2. Chronic compression deformity of L4 with associated 5 mm bony
retropulsion.
3. Multifactorial degenerative changes at L4-5 with resultant
moderate canal and bilateral subarticular stenosis, with moderate
left L4 foraminal narrowing.

## 2019-07-20 NOTE — Telephone Encounter (Signed)
Refill Brian Crenshaw  

## 2019-07-21 DIAGNOSIS — M810 Age-related osteoporosis without current pathological fracture: Secondary | ICD-10-CM | POA: Diagnosis not present

## 2019-08-03 DIAGNOSIS — S32019A Unspecified fracture of first lumbar vertebra, initial encounter for closed fracture: Secondary | ICD-10-CM | POA: Diagnosis not present

## 2019-08-03 DIAGNOSIS — M4854XA Collapsed vertebra, not elsewhere classified, thoracic region, initial encounter for fracture: Secondary | ICD-10-CM | POA: Diagnosis not present

## 2019-08-03 DIAGNOSIS — M4856XD Collapsed vertebra, not elsewhere classified, lumbar region, subsequent encounter for fracture with routine healing: Secondary | ICD-10-CM | POA: Diagnosis not present

## 2019-08-03 DIAGNOSIS — Z4789 Encounter for other orthopedic aftercare: Secondary | ICD-10-CM | POA: Diagnosis not present

## 2019-08-17 DIAGNOSIS — I2510 Atherosclerotic heart disease of native coronary artery without angina pectoris: Secondary | ICD-10-CM | POA: Diagnosis not present

## 2019-08-17 DIAGNOSIS — I480 Paroxysmal atrial fibrillation: Secondary | ICD-10-CM | POA: Diagnosis not present

## 2019-08-17 DIAGNOSIS — M810 Age-related osteoporosis without current pathological fracture: Secondary | ICD-10-CM | POA: Diagnosis not present

## 2019-08-17 DIAGNOSIS — I110 Hypertensive heart disease with heart failure: Secondary | ICD-10-CM | POA: Diagnosis not present

## 2019-08-17 DIAGNOSIS — I5032 Chronic diastolic (congestive) heart failure: Secondary | ICD-10-CM | POA: Diagnosis not present

## 2019-08-17 DIAGNOSIS — I10 Essential (primary) hypertension: Secondary | ICD-10-CM | POA: Diagnosis not present

## 2019-08-17 DIAGNOSIS — E039 Hypothyroidism, unspecified: Secondary | ICD-10-CM | POA: Diagnosis not present

## 2019-08-20 DIAGNOSIS — M810 Age-related osteoporosis without current pathological fracture: Secondary | ICD-10-CM | POA: Diagnosis not present

## 2019-08-24 DIAGNOSIS — E039 Hypothyroidism, unspecified: Secondary | ICD-10-CM | POA: Diagnosis not present

## 2019-08-24 DIAGNOSIS — I10 Essential (primary) hypertension: Secondary | ICD-10-CM | POA: Diagnosis not present

## 2019-08-24 DIAGNOSIS — E440 Moderate protein-calorie malnutrition: Secondary | ICD-10-CM | POA: Diagnosis not present

## 2019-08-24 DIAGNOSIS — G2581 Restless legs syndrome: Secondary | ICD-10-CM | POA: Diagnosis not present

## 2019-08-24 DIAGNOSIS — R600 Localized edema: Secondary | ICD-10-CM | POA: Diagnosis not present

## 2019-08-24 DIAGNOSIS — Z1389 Encounter for screening for other disorder: Secondary | ICD-10-CM | POA: Diagnosis not present

## 2019-08-24 DIAGNOSIS — I5032 Chronic diastolic (congestive) heart failure: Secondary | ICD-10-CM | POA: Diagnosis not present

## 2019-08-24 DIAGNOSIS — I480 Paroxysmal atrial fibrillation: Secondary | ICD-10-CM | POA: Diagnosis not present

## 2019-08-24 DIAGNOSIS — E7800 Pure hypercholesterolemia, unspecified: Secondary | ICD-10-CM | POA: Diagnosis not present

## 2019-08-24 DIAGNOSIS — Z952 Presence of prosthetic heart valve: Secondary | ICD-10-CM | POA: Diagnosis not present

## 2019-08-24 DIAGNOSIS — Z0000 Encounter for general adult medical examination without abnormal findings: Secondary | ICD-10-CM | POA: Diagnosis not present

## 2019-08-24 DIAGNOSIS — R413 Other amnesia: Secondary | ICD-10-CM | POA: Diagnosis not present

## 2019-08-31 DIAGNOSIS — Z4889 Encounter for other specified surgical aftercare: Secondary | ICD-10-CM | POA: Diagnosis not present

## 2019-09-07 ENCOUNTER — Other Ambulatory Visit: Payer: Self-pay

## 2019-09-07 DIAGNOSIS — I480 Paroxysmal atrial fibrillation: Secondary | ICD-10-CM

## 2019-09-07 MED ORDER — AMIODARONE HCL 200 MG PO TABS: 200.0000 mg | ORAL_TABLET | Freq: Every day | ORAL | 1 refills | Status: DC

## 2019-09-14 DIAGNOSIS — B029 Zoster without complications: Secondary | ICD-10-CM | POA: Diagnosis not present

## 2019-09-21 DIAGNOSIS — M810 Age-related osteoporosis without current pathological fracture: Secondary | ICD-10-CM | POA: Diagnosis not present

## 2019-09-23 DIAGNOSIS — N390 Urinary tract infection, site not specified: Secondary | ICD-10-CM | POA: Diagnosis not present

## 2019-09-23 DIAGNOSIS — D6869 Other thrombophilia: Secondary | ICD-10-CM | POA: Diagnosis not present

## 2019-09-23 DIAGNOSIS — Z7901 Long term (current) use of anticoagulants: Secondary | ICD-10-CM | POA: Diagnosis not present

## 2019-09-23 DIAGNOSIS — I480 Paroxysmal atrial fibrillation: Secondary | ICD-10-CM | POA: Diagnosis not present

## 2019-09-23 DIAGNOSIS — B029 Zoster without complications: Secondary | ICD-10-CM | POA: Diagnosis not present

## 2019-09-25 ENCOUNTER — Telehealth: Payer: Self-pay | Admitting: Cardiology

## 2019-09-25 NOTE — Telephone Encounter (Signed)
Spoke with pt husband, the UTI has cleared up and the patient is still bleeding when she urinates. The patient has been off the eliquis for the second day today. He is not able to tell if from the urinary tract or vagina. It occurs when she urinates. He is going to call the GYN but is not sure what to do about the eliquis. Will forward for dr Stanford Breed review

## 2019-09-25 NOTE — Telephone Encounter (Signed)
New Message:     Pt had UTI and had bleeding. Pt wants to know what she should do about her Eliquis please?Kayla Chambers

## 2019-09-25 NOTE — Telephone Encounter (Signed)
Left message for pt to call.

## 2019-09-28 ENCOUNTER — Telehealth: Payer: Self-pay | Admitting: Cardiology

## 2019-09-28 MED ORDER — ALPRAZOLAM 0.25 MG PO TABS: 0.5000 mg | ORAL_TABLET | Freq: Every day | ORAL | 3 refills | Status: DC | PRN

## 2019-09-28 NOTE — Telephone Encounter (Signed)
Called patient husband- he states that the pharmacy has the 0.5 mg dose on backorder- it is a Designer, fashion/clothing problem as they have tried other pharmacies as well. They have other doses and just wanted to know if okay to switch they have 0.25 mg doses. Patient husband just wants to have it sent to pharmacy.  Will route to MD to recommend dose.

## 2019-09-28 NOTE — Telephone Encounter (Signed)
Called RX into pharmacy- updated in computer system.

## 2019-09-28 NOTE — Telephone Encounter (Signed)
Spoke with pt husband, he reports the bleeding has improved and the eliquis was restarted this morning. He will watch for bleeding closely.

## 2019-09-28 NOTE — Telephone Encounter (Signed)
Hold apixaban until hematuria improves and then resume Kayla Chambers

## 2019-09-28 NOTE — Telephone Encounter (Signed)
Spoke with pt husband, he is going to call the medical doctor as he wants a higher dosage because the 0.5 mg does not always help her sleep. Advised the increase would need to come from medical doctor. He will call us back if not able to get script.

## 2019-09-28 NOTE — Telephone Encounter (Signed)
Ok for xanax 0.25 mg bid as needed. [name]

## 2019-09-28 NOTE — Telephone Encounter (Signed)
Pt c/o medication issue:  1. Name of Medication: ALPRAZolam (XANAX) 0.5 MG tablet 2. How are you currently taking this medication (dosage and times per day)? As directed  3. Are you having a reaction (difficulty breathing--STAT)? no  4. What is your medication issue? Patient's husband states that the manufacturer does not make a 0.5mg  for ALPRAZolam, he is confused on what to do. He has spoken with [facility] [place] [facility] [place] and they are unable to refill it.

## 2019-10-09 DIAGNOSIS — K9089 Other intestinal malabsorption: Secondary | ICD-10-CM | POA: Diagnosis not present

## 2019-10-09 DIAGNOSIS — K769 Liver disease, unspecified: Secondary | ICD-10-CM | POA: Diagnosis not present

## 2019-10-09 DIAGNOSIS — I480 Paroxysmal atrial fibrillation: Secondary | ICD-10-CM | POA: Diagnosis not present

## 2019-10-09 DIAGNOSIS — K7460 Unspecified cirrhosis of liver: Secondary | ICD-10-CM | POA: Diagnosis not present

## 2019-10-09 DIAGNOSIS — R140 Abdominal distension (gaseous): Secondary | ICD-10-CM | POA: Diagnosis not present

## 2019-10-12 ENCOUNTER — Other Ambulatory Visit: Payer: Self-pay | Admitting: Gastroenterology

## 2019-10-12 DIAGNOSIS — R140 Abdominal distension (gaseous): Secondary | ICD-10-CM

## 2019-10-21 DIAGNOSIS — M810 Age-related osteoporosis without current pathological fracture: Secondary | ICD-10-CM | POA: Diagnosis not present

## 2019-10-21 NOTE — Progress Notes (Deleted)
HPI: Follow-up CADstatus post coronary artery bypass graft, Bentall/AVR and maze procedure. She underwent a Bentall procedure by Dr. Servando Snare on 03/31/13. 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 Preoperatively she had been in and out of atrial fibrillation. Carotid Dopplers March 2017 showed 1-39% bilateral stenosis.Has had recurrent atrial fibrillation now maintained on amiodarone. Last echocardiogram November 2018 showed normal LV function, bioprosthetic aortic valve, mild mitral regurgitation, mild left atrial enlargement,severe right atrial enlargement and severe TR.Since last seen,   Current Outpatient Medications  Medication Sig Dispense Refill  . acetaminophen (TYLENOL) 500 MG tablet Take 2 tablets (1,000 mg total) by mouth every 6 (six) hours as needed for mild pain (pain.). 30 tablet 0  . ALPRAZolam (XANAX) 0.25 MG tablet Take 2 tablets (0.5 mg total) by mouth daily as needed for anxiety. 30 tablet 3  . amiodarone (PACERONE) 200 MG tablet Take 1 tablet (200 mg total) by mouth daily. 180 tablet 1  . furosemide (LASIX) 40 MG tablet Take 1 tablet (40 mg total) by mouth daily. 90 tablet 3  . lactase (LACTAID) 3000 UNITS tablet Take 1 tablet by mouth daily as needed (only when eating dairy).    Marland Kitchen levothyroxine (SYNTHROID, LEVOTHROID) 75 MCG tablet Take 75 mcg by mouth at bedtime.     Marland Kitchen loperamide (IMODIUM A-D) 2 MG tablet Take 2 mg by mouth 4 (four) times daily as needed for diarrhea or loose stools.    . polyethylene glycol (MIRALAX / GLYCOLAX) packet Take 17 g by mouth daily. (Patient not taking: Reported on 07/01/2019) 14 each 0  . pravastatin (PRAVACHOL) 40 MG tablet TAKE 1 TABLET BY MOUTH EVERY EVENING (Patient taking differently: Take 40 mg by mouth at bedtime. ) 90 tablet 3  . rOPINIRole (REQUIP) 0.25 MG tablet Take 0.25 mg by mouth at bedtime.       No current facility-administered medications for this visit.      Past Medical History:  Diagnosis Date  . Anxiety    takes xanax for sleep.   . Aortic insufficiency    a. 03/2013 s/p  AVR w/ biologic 91mm Magna Ease peric tissue valve, model 300TFX, ser# [id number].   . Back pain    BACK BRACE/FRACTURE  . CAD (coronary artery disease)    a. 01/2013 mod calcification w/o sev dzs;  b. 03/2013 CABG x1 VG->RCA @ time of AVR  . Chronic diastolic CHF (congestive heart failure) ([place])    a. 01/2013 echo: EF 50-55%.  . Compression fracture of body of thoracic vertebra (HCC)    T-11  . Dementia ([place])   . Dilated aortic root ([place])    a. 03/2013 s/p Bentall procedure with Ao Root replacement (21mm Valsalva graft) w/ reimplantationof the R and L coronary ostium.  Marland Kitchen Dysrhythmia    A. FIB  . Fibromyalgia   . Headache(784.0)    hx migraines  . Hypothyroidism   . Intracranial aneurysm    in late 1970's  . 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 Neoadjuvant RT by Dr. Yisroel Ramming. Surveillance plan: MRI 12/08/11 shows changes in presumed post-op seroma/hematoma with slight increase in size - follow up MRI 11/18/12 showed same size. Will continue with q3 month MRI. CT C/A/P annually alternating with CXR at 6 months.   . Osteoporosis   .  Paroxysmal A-fib ([place])    a. Dx 01/2013 - placed on xarelto;  b. 03/2013 s/p left sided MAZE and LA clip @ time of AVR/CABG.  . Peripheral neuropathy   . PMR (polymyalgia rheumatica) (HCC)    Inactive  . Rheumatic heart disease   . 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 Vertigo     Past Surgical History:  Procedure Laterality Date  . ABDOMINAL HYSTERECTOMY    . ASCENDING AORTIC ROOT REPLACEMENT N/A 03/31/2013   Procedure: ASCENDING AORTIC ROOT REPLACEMENT;  Surgeon: Grace Isaac, MD;  Location: [place];  Service: Open Heart Surgery;   Laterality: N/A; clip inserted.  Marland Kitchen BLADDER SUSPENSION    . CARDIOVERSION N/A 01/29/2017   Procedure: CARDIOVERSION;  Surgeon: Jerline Pain, MD;  Location: [place];  Service: Cardiovascular;  Laterality: N/A;  . CARDIOVERSION N/A 10/22/2017   Procedure: CARDIOVERSION;  Surgeon: Lelon Perla, MD;  Location: [facility] ENDOSCOPY;  Service: Cardiovascular;  Laterality: N/A;  . COLONOSCOPY  10/25/2011   Procedure: COLONOSCOPY;  Surgeon: Garlan Fair, MD;  Location: [facility] ENDOSCOPY;  Service: Endoscopy;  Laterality: N/A;  . EYE SURGERY Bilateral    cataracts  . INTRAOPERATIVE TRANSESOPHAGEAL ECHOCARDIOGRAM N/A 03/31/2013   Procedure: INTRAOPERATIVE TRANSESOPHAGEAL ECHOCARDIOGRAM;  Surgeon: Grace Isaac, MD;  Location: [place];  Service: Open Heart Surgery;  Laterality: N/A;  . KYPHOPLASTY N/A 05/07/2018   Procedure: KYPHOPLASTY LUMBAR THREE;  Surgeon: Melina Schools, MD;  Location: [place];  Service: Orthopedics;  Laterality: N/A;  . KYPHOPLASTY N/A 05/14/2019   Procedure: KYPHOPLASTY T11;  Surgeon: Melina Schools, MD;  Location: [place];  Service: Orthopedics;  Laterality: N/A;  90 mins  . KYPHOPLASTY N/A 07/08/2019   Procedure: KYPHOPLASTY THORACIC TWELVE;  Surgeon: Melina Schools, MD;  Location: [place];  Service: Orthopedics;  Laterality: N/A;  60 mins  . LEFT AND RIGHT HEART CATHETERIZATION WITH CORONARY ANGIOGRAM Right 02/02/2013   Procedure: LEFT AND RIGHT HEART CATHETERIZATION WITH CORONARY ANGIOGRAM;  Surgeon: Hillary Bow, MD;  Location: [facility] CATH LAB;  Service: Cardiovascular;  Laterality: Right;  Marland Kitchen MAZE N/A 03/31/2013   Procedure: MAZE;  Surgeon: Grace Isaac, MD;  Location: [place];  Service: Open Heart Surgery;  Laterality: N/A;  . Sarcoma resection  2011    Social History   Socioeconomic History  . Marital status: Married    Spouse name: Not on file  . Number of children: Not on file  . Years of education: Not on file  . Highest education level: Not on file  Occupational  History  . Not on file  Social Needs  . Financial resource strain: Not on file  . Food insecurity    Worry: Not on file    Inability: Not on file  . Transportation needs    Medical: Not on file    Non-medical: Not on file  Tobacco Use  . Smoking status: Never Smoker  . Smokeless tobacco: Never Used  Substance and Sexual Activity  . Alcohol use: No  . Drug use: No  . Sexual activity: Yes    Birth control/protection: Post-menopausal  Lifestyle  . Physical activity    Days per week: Not on file    Minutes per session: Not on file  . Stress: Not on file  Relationships  . Social Herbalist on phone: Not on file    Gets together: Not on file    Attends religious service:  Not on file    Active member of club or organization: Not on file    Attends meetings of clubs or organizations: Not on file    Relationship status: Not on file  . Intimate partner violence    Fear of current or ex partner: Not on file    Emotionally abused: Not on file    Physically abused: Not on file    Forced sexual activity: Not on file  Other Topics Concern  . Not on file  Social History Narrative  . Not on file    Family History  Problem Relation Age of Onset  . Heart disease Mother   . Stroke Mother   . Arthritis Father   . Cancer Father   . Anesthesia problems Brother   . Hypertension Sister     ROS: no fevers or chills, productive cough, hemoptysis, dysphasia, odynophagia, melena, hematochezia, dysuria, hematuria, rash, seizure activity, orthopnea, PND, pedal edema, claudication. Remaining systems are negative.  Physical Exam: Well-developed well-nourished in no acute distress.  Skin is warm and dry.  HEENT is normal.  Neck is supple.  Chest is clear to auscultation with normal expansion.  Cardiovascular exam is regular rate and rhythm.  Abdominal exam nontender or distended. No masses palpated. Extremities show no edema. neuro grossly intact  ECG- personally reviewed   A/P  1 paroxysmal atrial fibrillation-patient remains in sinus rhythm.  Continue amiodarone.  Check chest x-ray, liver functions, TSH, hemoglobin and creatinine.  Continue apixaban.  2 coronary artery disease-continue statin.  No aspirin given need for apixaban.  3 chronic diastolic congestive heart failure-patient's volume status appears to be reasonable at present.  Continue present dose of Lasix.  Check potassium and renal function.  4 hypertension-blood pressure controlled.  5 hyperlipidemia-continue statin.  6 aortic valve replacement-continue SBE prophylaxis.  7 severe tricuspid regurgitation.  Conservative measures given comorbidities and age.  [name], MD

## 2019-10-22 ENCOUNTER — Other Ambulatory Visit: Payer: Self-pay

## 2019-10-22 ENCOUNTER — Ambulatory Visit
Admission: RE | Admit: 2019-10-22 | Discharge: 2019-10-22 | Disposition: A | Discharge status: Home / Self Care | Payer: Medicare Other | Source: Ambulatory Visit | Attending: Gastroenterology | Admitting: Gastroenterology

## 2019-10-22 DIAGNOSIS — K5730 Diverticulosis of large intestine without perforation or abscess without bleeding: Secondary | ICD-10-CM | POA: Diagnosis not present

## 2019-10-22 DIAGNOSIS — R140 Abdominal distension (gaseous): Secondary | ICD-10-CM

## 2019-10-22 MED ORDER — IOPAMIDOL (ISOVUE-300) INJECTION 61%
100.0000 mL | Freq: Once | INTRAVENOUS | Status: AC | PRN
  Administered 2019-10-22: 14:00:00 100 mL via INTRAVENOUS

## 2019-10-23 ENCOUNTER — Telehealth: Payer: Self-pay | Admitting: Cardiology

## 2019-10-23 NOTE — Telephone Encounter (Signed)
Per pt's husbands call he would like to know if the 12/9 appt can be a virtual appointment or does she have to come in.

## 2019-10-23 NOTE — Telephone Encounter (Signed)
Spoke with pt, virtual visit arranged with dr Stanford Breed next week.

## 2019-10-26 NOTE — Progress Notes (Signed)
Virtual Visit via Video Note   This visit type was conducted due to national recommendations for restrictions regarding the COVID-19 Pandemic (e.g. social distancing) in an effort to limit this patient's exposure and mitigate transmission in our community.  Due to her co-morbid illnesses, this patient is at least at moderate risk for complications without adequate follow up.  This format is felt to be most appropriate for this patient at this time.  All issues noted in this document were discussed and addressed.  A limited physical exam was performed with this format.  Please refer to the patient's chart for her consent to telehealth for [facility].   Date:  10/27/2019   ID:  Kayla Chambers, DOB 05-Oct-1927, MRN [id number]  Patient Location:Home Provider Location: Home  PCP:  Donald Prose, MD  Cardiologist:  Dr Stanford Breed  Evaluation Performed:  Follow-Up Visit  Chief Complaint:  FU CAD  History of Present Illness:    Follow-up CADstatus post coronary artery bypass graft, Bentall/AVR and maze procedure. She underwent a Bentall procedure by Dr. Servando Snare on 03/31/13. 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 Preoperatively she had been in and out of atrial fibrillation. Carotid Dopplers March 2017 showed 1-39% bilateral stenosis.Has had recurrent atrial fibrillation. Previously seen with recurrent atrial fibrillation and CHF; diuretics increased and amiodarone initiated; had repeat DCCV 10/22/17. Seen in fu and noted to be in atrial flutter. Plan was repeat DCCV following full amiodarone load.Last echocardiogram November 2018 showed normal LV function, bioprosthetic aortic valve, mild mitral regurgitation, mild left atrial enlargement,severe right atrial enlargement and severe TR.Since last seen,she denies dyspnea, chest pain, palpitations, syncope or pedal  edema.  She had hematuria recently which resolved after holding Eliquis.  She has fallen 3 times in the past 1-1/2 years.  The patient does not have symptoms concerning for COVID-19 infection (fever, chills, cough, or new shortness of breath).    Past Medical History:  Diagnosis Date  . Anxiety    takes xanax for sleep.   . Aortic insufficiency    a. 03/2013 s/p  AVR w/ biologic 49mm Magna Ease peric tissue valve, model 300TFX, ser# [id number].   . Back pain    BACK BRACE/FRACTURE  . CAD (coronary artery disease)    a. 01/2013 mod calcification w/o sev dzs;  b. 03/2013 CABG x1 VG->RCA @ time of AVR  . Chronic diastolic CHF (congestive heart failure) ([place])    a. 01/2013 echo: EF 50-55%.  . Compression fracture of body of thoracic vertebra (HCC)    T-11  . Dementia ([place])   . Dilated aortic root ([place])    a. 03/2013 s/p Bentall procedure with Ao Root replacement (36mm Valsalva graft) w/ reimplantationof the R and L coronary ostium.  Marland Kitchen Dysrhythmia    A. FIB  . Fibromyalgia   . Headache(784.0)    hx migraines  . Hypothyroidism   . Intracranial aneurysm    in late 1970's  . 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 Neoadjuvant RT by Dr. Yisroel Ramming. Surveillance plan: MRI 12/08/11 shows changes in presumed post-op seroma/hematoma with slight increase in size - follow up MRI 11/18/12 showed same size. Will continue with q3 month MRI. CT C/A/P annually alternating with CXR at 6 months.   . Osteoporosis   . Paroxysmal A-fib ([place])  a. Dx 01/2013 - placed on xarelto;  b. 03/2013 s/p left sided MAZE and LA clip @ time of AVR/CABG.  . Peripheral neuropathy   . PMR (polymyalgia rheumatica) (HCC)    Inactive  . Rheumatic heart disease   . 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 Vertigo    Past Surgical History:  Procedure Laterality Date  . ABDOMINAL HYSTERECTOMY     . ASCENDING AORTIC ROOT REPLACEMENT N/A 03/31/2013   Procedure: ASCENDING AORTIC ROOT REPLACEMENT;  Surgeon: Grace Isaac, MD;  Location: [place];  Service: Open Heart Surgery;  Laterality: N/A; clip inserted.  Marland Kitchen BLADDER SUSPENSION    . CARDIOVERSION N/A 01/29/2017   Procedure: CARDIOVERSION;  Surgeon: Jerline Pain, MD;  Location: [place];  Service: Cardiovascular;  Laterality: N/A;  . CARDIOVERSION N/A 10/22/2017   Procedure: CARDIOVERSION;  Surgeon: Lelon Perla, MD;  Location: [facility] ENDOSCOPY;  Service: Cardiovascular;  Laterality: N/A;  . COLONOSCOPY  10/25/2011   Procedure: COLONOSCOPY;  Surgeon: Garlan Fair, MD;  Location: [facility] ENDOSCOPY;  Service: Endoscopy;  Laterality: N/A;  . EYE SURGERY Bilateral    cataracts  . INTRAOPERATIVE TRANSESOPHAGEAL ECHOCARDIOGRAM N/A 03/31/2013   Procedure: INTRAOPERATIVE TRANSESOPHAGEAL ECHOCARDIOGRAM;  Surgeon: Grace Isaac, MD;  Location: [place];  Service: Open Heart Surgery;  Laterality: N/A;  . KYPHOPLASTY N/A 05/07/2018   Procedure: KYPHOPLASTY LUMBAR THREE;  Surgeon: Melina Schools, MD;  Location: [place];  Service: Orthopedics;  Laterality: N/A;  . KYPHOPLASTY N/A 05/14/2019   Procedure: KYPHOPLASTY T11;  Surgeon: Melina Schools, MD;  Location: [place];  Service: Orthopedics;  Laterality: N/A;  90 mins  . KYPHOPLASTY N/A 07/08/2019   Procedure: KYPHOPLASTY THORACIC TWELVE;  Surgeon: Melina Schools, MD;  Location: [place];  Service: Orthopedics;  Laterality: N/A;  60 mins  . LEFT AND RIGHT HEART CATHETERIZATION WITH CORONARY ANGIOGRAM Right 02/02/2013   Procedure: LEFT AND RIGHT HEART CATHETERIZATION WITH CORONARY ANGIOGRAM;  Surgeon: Hillary Bow, MD;  Location: [facility] CATH LAB;  Service: Cardiovascular;  Laterality: Right;  Marland Kitchen MAZE N/A 03/31/2013   Procedure: MAZE;  Surgeon: Grace Isaac, MD;  Location: [place];  Service: Open Heart Surgery;  Laterality: N/A;  . Sarcoma resection  2011     Current Meds  Medication Sig  . acetaminophen  (TYLENOL) 500 MG tablet Take 2 tablets (1,000 mg total) by mouth every 6 (six) hours as needed for mild pain (pain.).  Marland Kitchen ALPRAZolam (XANAX) 0.25 MG tablet Take 2 tablets (0.5 mg total) by mouth daily as needed for anxiety.  Marland Kitchen amiodarone (PACERONE) 200 MG tablet Take 1 tablet (200 mg total) by mouth daily.  . furosemide (LASIX) 40 MG tablet Take 1 tablet (40 mg total) by mouth daily.  Marland Kitchen lactase (LACTAID) 3000 UNITS tablet Take 1 tablet by mouth daily as needed (only when eating dairy).  Marland Kitchen levothyroxine (SYNTHROID, LEVOTHROID) 75 MCG tablet Take 75 mcg by mouth at bedtime.   Marland Kitchen loperamide (IMODIUM A-D) 2 MG tablet Take 2 mg by mouth 4 (four) times daily as needed for diarrhea or loose stools.  . pravastatin (PRAVACHOL) 40 MG tablet TAKE 1 TABLET BY MOUTH EVERY EVENING (Patient taking differently: Take 40 mg by mouth at bedtime. )  . rOPINIRole (REQUIP) 0.25 MG tablet Take 0.25 mg by mouth at bedtime.      Allergies:   Alendronate sodium, Ambien [zolpidem tartrate], Lac bovis, Lyrica [pregabalin], Milk-related compounds, Neurontin [gabapentin], Procaine hcl, and  Toprol xl [metoprolol succinate]   Social History   Tobacco Use  . Smoking status: Never Smoker  . Smokeless tobacco: Never Used  Substance Use Topics  . Alcohol use: No  . Drug use: No     Family Hx: The patient's family history includes Anesthesia problems in her brother; Arthritis in her father; Cancer in her father; Heart disease in her mother; Hypertension in her sister; Stroke in her mother.  ROS:   Please see the history of present illness.    No Fever, chills  or productive cough All other systems reviewed and are negative.   Recent Labs: 05/14/2019: ALT 17 07/08/2019: BUN 9; Creatinine, Ser 0.65; Hemoglobin 14.4; Platelets 160; Potassium 4.0; Sodium 138   Recent Lipid Panel Lab Results  Component Value Date/Time   CHOL 154 05/16/2017 12:18 PM   TRIG 97 05/16/2017 12:18 PM   HDL 57 05/16/2017 12:18 PM   CHOLHDL 2.7  05/16/2017 12:18 PM   CHOLHDL 2.3 05/04/2016 08:20 AM   LDLCALC 78 05/16/2017 12:18 PM    Wt Readings from Last 3 Encounters:  10/27/19 126 lb (57.2 kg)  07/08/19 126 lb (57.2 kg)  05/14/19 134 lb (60.8 kg)     Objective:    Vital Signs:  BP 136/81   Pulse (!) 56   Ht 5\' 5"  (1.651 m)   Wt 126 lb (57.2 kg)   LMP  (LMP Unknown)   BMI 20.97 kg/m    VITAL SIGNS:  reviewed NAD Answers questions appropriately Normal affect Remainder of physical examination not performed (telehealth visit; coronavirus pandemic)  ASSESSMENT & PLAN:    1. Paroxysmal atrial fibrillation-patient remains in sinus rhythm based on history.  Continue amiodarone.  Check TSH, liver functions, hemoglobin and renal function.  Recent chest x-ray without acute infiltrate.  She has fallen 3 times in the past 1-1/2 years.  We discussed the risks and benefits of apixaban.  Patient's husband states that she is more stable now than she has been in a long time.  If she has more frequent falls in the future we will discontinue apixaban but for now we will continue. 2. Coronary artery disease-no chest pain.  Continue statin.  She is not on aspirin given need for anticoagulation. 3. Hypertension-blood pressure is controlled.  Continue present medical regimen. 4. Prior aortic valve replacement-continue SBE prophylaxis. 5. Chronic diastolic congestive heart failure-she appears to be euvolemic.  The majority of her heart failure previously was related to recurrent atrial fibrillation.  Continue Lasix at present dose.  Check potassium and renal function. 6. Hyperlipidemia-continue statin. 7. Tricuspid regurgitation-we are treating conservatively given her overall medical condition and age.  COVID-19 Education: The importance of social distancing was discussed today.  Time:   Today, I have spent 17 minutes with the patient with telehealth technology discussing the above problems.     Medication Adjustments/Labs and Tests  Ordered: Current medicines are reviewed at length with the patient today.  Concerns regarding medicines are outlined above.   Tests Ordered: No orders of the defined types were placed in this encounter.   Medication Changes: No orders of the defined types were placed in this encounter.   Follow Up:  Either In Person or Virtual in 6 month(s)  Signed, [name], MD  10/27/2019 8:25 AM    [place]

## 2019-10-27 ENCOUNTER — Encounter: Payer: Self-pay | Admitting: Cardiology

## 2019-10-27 ENCOUNTER — Other Ambulatory Visit: Payer: Self-pay | Admitting: *Deleted

## 2019-10-27 ENCOUNTER — Telehealth (INDEPENDENT_AMBULATORY_CARE_PROVIDER_SITE_OTHER): Payer: Medicare Other | Admitting: Cardiology

## 2019-10-27 VITALS — BP 136/81 | HR 56 | Ht 65.0 in | Wt 126.0 lb

## 2019-10-27 DIAGNOSIS — I5032 Chronic diastolic (congestive) heart failure: Secondary | ICD-10-CM

## 2019-10-27 DIAGNOSIS — I2510 Atherosclerotic heart disease of native coronary artery without angina pectoris: Secondary | ICD-10-CM | POA: Diagnosis not present

## 2019-10-27 DIAGNOSIS — I071 Rheumatic tricuspid insufficiency: Secondary | ICD-10-CM

## 2019-10-27 DIAGNOSIS — I480 Paroxysmal atrial fibrillation: Secondary | ICD-10-CM

## 2019-10-27 DIAGNOSIS — E785 Hyperlipidemia, unspecified: Secondary | ICD-10-CM

## 2019-10-27 DIAGNOSIS — I110 Hypertensive heart disease with heart failure: Secondary | ICD-10-CM

## 2019-10-27 DIAGNOSIS — Z952 Presence of prosthetic heart valve: Secondary | ICD-10-CM

## 2019-10-27 DIAGNOSIS — Z7901 Long term (current) use of anticoagulants: Secondary | ICD-10-CM | POA: Diagnosis not present

## 2019-10-27 MED ORDER — PRAVASTATIN SODIUM 40 MG PO TABS: 40.0000 mg | ORAL_TABLET | Freq: Every evening | ORAL | 3 refills | Status: DC

## 2019-10-27 NOTE — Telephone Encounter (Signed)
Rx has been sent to the pharmacy electronically. ° °

## 2019-10-27 NOTE — Patient Instructions (Signed)
Medication Instructions:  NO CHANGE *If you need a refill on your cardiac medications before your next appointment, please call your pharmacy*  Lab Work: Your physician recommends that you return for lab work WHEN CONVENIENT  If you have labs (blood work) drawn today and your tests are completely normal, you will receive your results only by: Marland Kitchen MyChart Message (if you have MyChart) OR . A paper copy in the mail If you have any lab test that is abnormal or we need to change your treatment, we will call you to review the results.  Follow-Up: At [facility], you and your health needs are our priority.  As part of our continuing mission to provide you with exceptional heart care, we have created designated Provider Care Teams.  These Care Teams include your primary Cardiologist (physician) and Advanced Practice Providers (APPs -  Physician Assistants and Nurse Practitioners) who all work together to provide you with the care you need, when you need it.  Your next appointment:   6 month(s)  The format for your next appointment:   Either In Person or Virtual  Provider:   You may see [name], MD or one of the following Advanced Practice Providers on your designated Care Team:    Kerin Ransom, PA-C  [place], [place]  Coletta Memos, [place]

## 2019-10-28 ENCOUNTER — Telehealth: Payer: Medicare Other | Admitting: Cardiology

## 2019-11-02 DIAGNOSIS — Z4889 Encounter for other specified surgical aftercare: Secondary | ICD-10-CM | POA: Diagnosis not present

## 2019-11-02 DIAGNOSIS — Z7901 Long term (current) use of anticoagulants: Secondary | ICD-10-CM | POA: Diagnosis not present

## 2019-11-02 DIAGNOSIS — I480 Paroxysmal atrial fibrillation: Secondary | ICD-10-CM | POA: Diagnosis not present

## 2019-11-03 ENCOUNTER — Encounter: Payer: Self-pay | Admitting: Cardiology

## 2019-11-03 LAB — COMPREHENSIVE METABOLIC PANEL
ALT: 14 IU/L (ref 0–32)
AST: 22 IU/L (ref 0–40)
Albumin/Globulin Ratio: 1.8 (ref 1.2–2.2)
Albumin: 4.1 g/dL (ref 3.5–4.6)
Alkaline Phosphatase: 141 IU/L — ABNORMAL HIGH (ref 39–117)
BUN/Creatinine Ratio: 15 (ref 12–28)
BUN: 12 mg/dL (ref 10–36)
Bilirubin Total: 0.3 mg/dL (ref 0.0–1.2)
CO2: 27 mmol/L (ref 20–29)
Calcium: 9.3 mg/dL (ref 8.7–10.3)
Chloride: 101 mmol/L (ref 96–106)
Creatinine, Ser: 0.78 mg/dL (ref 0.57–1.00)
GFR calc Af Amer: 77 mL/min/{1.73_m2} (ref >59)
GFR calc non Af Amer: 67 mL/min/{1.73_m2} (ref >59)
Globulin, Total: 2.3 g/dL (ref 1.5–4.5)
Glucose: 83 mg/dL (ref 65–99)
Potassium: 5.1 mmol/L (ref 3.5–5.2)
Sodium: 139 mmol/L (ref 134–144)
Total Protein: 6.4 g/dL (ref 6.0–8.5)

## 2019-11-03 LAB — CBC
Hematocrit: 35.7 % (ref 34.0–46.6)
Hemoglobin: 13.1 g/dL (ref 11.1–15.9)
MCH: 35.7 pg — ABNORMAL HIGH (ref 26.6–33.0)
MCHC: 36.7 g/dL — ABNORMAL HIGH (ref 31.5–35.7)
MCV: 97 fL (ref 79–97)
Platelets: 169 10*3/uL (ref 150–450)
RBC: 3.67 x10E6/uL — ABNORMAL LOW (ref 3.77–5.28)
RDW: 14.1 % (ref 11.7–15.4)
WBC: 4.8 10*3/uL (ref 3.4–10.8)

## 2019-11-03 LAB — TSH: TSH: 2.95 u[IU]/mL (ref 0.450–4.500)

## 2019-11-04 ENCOUNTER — Encounter: Payer: Self-pay | Admitting: *Deleted

## 2019-11-19 DIAGNOSIS — U071 COVID-19: Secondary | ICD-10-CM | POA: Diagnosis not present

## 2019-11-19 DIAGNOSIS — J069 Acute upper respiratory infection, unspecified: Secondary | ICD-10-CM | POA: Diagnosis not present

## 2019-11-20 ENCOUNTER — Other Ambulatory Visit: Payer: Self-pay | Admitting: Unknown Physician Specialty

## 2019-11-20 ENCOUNTER — Telehealth: Payer: Self-pay | Admitting: Unknown Physician Specialty

## 2019-11-20 DIAGNOSIS — U071 COVID-19: Secondary | ICD-10-CM

## 2019-11-20 NOTE — Telephone Encounter (Signed)
  I connected by phone with Kayla Chambers's daughter and husband on 11/20/2019 at 11:18 AM to discuss the potential use of an new treatment for mild to moderate COVID-19 viral infection in non-hospitalized patients.  This patient is a 86 y.o. female that meets the FDA criteria for Emergency Use Authorization of bamlanivimab or casirivimab\imdevimab.  Has a (+) direct SARS-CoV-2 viral test result  Has mild or moderate COVID-19   Is ? 84 years of age and weighs ? 40 kg  Is NOT hospitalized due to COVID-19  Is NOT requiring oxygen therapy or requiring an increase in baseline oxygen flow rate due to COVID-19  Is within 10 days of symptom onset  Has at least one of the high risk factor(s) for progression to severe COVID-19 and/or hospitalization as defined in EUA.  Specific high risk criteria : >/= 84 yo   I have spoken and communicated the following to the patient or parent/caregiver:  1. FDA has authorized the emergency use of bamlanivimab and casirivimab\imdevimab for the treatment of mild to moderate COVID-19 in adults and pediatric patients with positive results of direct SARS-CoV-2 viral testing who are 55 years of age and older weighing at least 40 kg, and who are at high risk for progressing to severe COVID-19 and/or hospitalization.  2. The significant known and potential risks and benefits of bamlanivimab and casirivimab\imdevimab, and the extent to which such potential risks and benefits are unknown.  3. Information on available alternative treatments and the risks and benefits of those alternatives, including clinical trials.  4. Patients treated with bamlanivimab and casirivimab\imdevimab should continue to self-isolate and use infection control measures (e.g., wear mask, isolate, social distance, avoid sharing personal items, clean and disinfect "high touch" surfaces, and frequent handwashing) according to CDC guidelines.   5. The patient or parent/caregiver has the option to  accept or refuse bamlanivimab or casirivimab\imdevimab .  After reviewing this information with the patient, The patient agreed to proceed with receiving the bamlanimivab infusion and will be provided a copy of the Fact sheet prior to receiving the infusion..  Note: daughter and husband agreed Kayla Chambers 11/20/2019 11:18 AM Symptom onset 12/28

## 2019-11-24 ENCOUNTER — Ambulatory Visit (HOSPITAL_COMMUNITY)
Admission: RE | Admit: 2019-11-24 | Discharge: 2019-11-24 | Disposition: A | Discharge status: Home / Self Care | Payer: Medicare Other | Source: Ambulatory Visit | Attending: Pulmonary Disease | Admitting: Pulmonary Disease

## 2019-11-24 DIAGNOSIS — U071 COVID-19: Secondary | ICD-10-CM

## 2019-11-24 DIAGNOSIS — Z23 Encounter for immunization: Secondary | ICD-10-CM | POA: Diagnosis not present

## 2019-11-24 MED ORDER — SODIUM CHLORIDE 0.9 % IV SOLN
700.0000 mg | Freq: Once | INTRAVENOUS | Status: AC
  Administered 2019-11-24: 13:00:00 700 mg via INTRAVENOUS
  Filled 2019-11-24: qty 20

## 2019-11-24 MED ORDER — METHYLPREDNISOLONE SODIUM SUCC 125 MG IJ SOLR: 125.0000 mg | Freq: Once | INTRAMUSCULAR | Status: DC | PRN

## 2019-11-24 MED ORDER — EPINEPHRINE 0.3 MG/0.3ML IJ SOAJ: 0.3000 mg | Freq: Once | INTRAMUSCULAR | Status: DC | PRN

## 2019-11-24 MED ORDER — DIPHENHYDRAMINE HCL 50 MG/ML IJ SOLN: 50.0000 mg | Freq: Once | INTRAMUSCULAR | Status: DC | PRN

## 2019-11-24 MED ORDER — ALBUTEROL SULFATE HFA 108 (90 BASE) MCG/ACT IN AERS: 2.0000 | INHALATION_SPRAY | Freq: Once | RESPIRATORY_TRACT | Status: DC | PRN

## 2019-11-24 MED ORDER — FAMOTIDINE IN NACL 20-0.9 MG/50ML-% IV SOLN: 20.0000 mg | Freq: Once | INTRAVENOUS | Status: DC | PRN

## 2019-11-24 MED ORDER — SODIUM CHLORIDE 0.9 % IV SOLN
INTRAVENOUS | Status: DC | PRN
  Administered 2019-11-24: 13:00:00 250 mL via INTRAVENOUS

## 2019-11-24 NOTE — Discharge Instructions (Signed)

## 2019-11-24 NOTE — Progress Notes (Signed)
Patient ID: EMRI CADD, female   DOB: 03/09/1927, 86 y.o.   MRN: [id number]  Diagnosis: [id number]  Physician:  Procedure: [facility] Med: bamlanivimab infusion - Provided patient with bamlanimivab fact sheet for patients, parents and caregivers prior to infusion.  Complications: No immediate complications noted.  Discharge: Discharged home   Heide Scales 11/24/2019

## 2019-12-03 DIAGNOSIS — M810 Age-related osteoporosis without current pathological fracture: Secondary | ICD-10-CM | POA: Diagnosis not present

## 2020-01-04 DIAGNOSIS — M810 Age-related osteoporosis without current pathological fracture: Secondary | ICD-10-CM | POA: Diagnosis not present

## 2020-02-01 DIAGNOSIS — M810 Age-related osteoporosis without current pathological fracture: Secondary | ICD-10-CM | POA: Diagnosis not present

## 2020-03-03 DIAGNOSIS — M810 Age-related osteoporosis without current pathological fracture: Secondary | ICD-10-CM | POA: Diagnosis not present

## 2020-03-29 ENCOUNTER — Other Ambulatory Visit: Payer: Self-pay | Admitting: Cardiology

## 2020-04-12 ENCOUNTER — Other Ambulatory Visit: Payer: Self-pay

## 2020-04-20 ENCOUNTER — Other Ambulatory Visit: Payer: Self-pay

## 2020-05-16 NOTE — Progress Notes (Signed)
HPI: Follow-up CADstatus post coronary artery bypass graft, Bentall/AVR and maze procedure. She underwent a Bentall procedure by Dr. Servando Snare on 03/31/13. 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 Preoperatively she had been in and out of atrial fibrillation. Carotid Dopplers March 2017 showed 1-39% bilateral stenosis.Has had recurrent atrial fibrillation. Previously seen with recurrent atrial fibrillation and CHF; diuretics increased and amiodarone initiated; had repeat DCCV 10/22/17. Seen in fu and noted to be in atrial flutter. Plan was repeat DCCV following full amiodarone load.Last echocardiogram November 2018 showed normal LV function, bioprosthetic aortic valve, mild mitral regurgitation, mild left atrial enlargement,severe right atrial enlargement and severe TR.Since last seen,she denies dyspnea, chest pain, palpitations, syncope or bleeding.  Current Outpatient Medications  Medication Sig Dispense Refill  . acetaminophen (TYLENOL) 500 MG tablet Take 2 tablets (1,000 mg total) by mouth every 6 (six) hours as needed for mild pain (pain.). 30 tablet 0  . ALPRAZolam (XANAX) 0.25 MG tablet Take 2 tablets (0.5 mg total) by mouth daily as needed for anxiety. 30 tablet 3  . amiodarone (PACERONE) 200 MG tablet Take 1 tablet (200 mg total) by mouth daily. 180 tablet 1  . ELIQUIS 2.5 MG TABS tablet TAKE 1 TABLET TWICE A DAY 180 tablet 3  . furosemide (LASIX) 40 MG tablet Take 1 tablet (40 mg total) by mouth daily. 90 tablet 3  . lactase (LACTAID) 3000 UNITS tablet Take 1 tablet by mouth daily as needed (only when eating dairy).    Marland Kitchen levothyroxine (SYNTHROID, LEVOTHROID) 75 MCG tablet Take 75 mcg by mouth at bedtime.     Marland Kitchen loperamide (IMODIUM A-D) 2 MG tablet Take 2 mg by mouth 4 (four) times daily as needed for diarrhea or loose stools.    . polyethylene glycol  (MIRALAX / GLYCOLAX) packet Take 17 g by mouth daily. 14 each 0  . pravastatin (PRAVACHOL) 40 MG tablet Take 1 tablet (40 mg total) by mouth every evening. 90 tablet 3  . rOPINIRole (REQUIP) 0.25 MG tablet Take 0.25 mg by mouth at bedtime.      No current facility-administered medications for this visit.     Past Medical History:  Diagnosis Date  . Anxiety    takes xanax for sleep.   . Aortic insufficiency    a. 03/2013 s/p  AVR w/ biologic 6mm Magna Ease peric tissue valve, model 300TFX, ser# [id number].   . Back pain    BACK BRACE/FRACTURE  . CAD (coronary artery disease)    a. 01/2013 mod calcification w/o sev dzs;  b. 03/2013 CABG x1 VG->RCA @ time of AVR  . Chronic diastolic CHF (congestive heart failure) ([place])    a. 01/2013 echo: EF 50-55%.  . Compression fracture of body of thoracic vertebra (HCC)    T-11  . Dementia ([place])   . Dilated aortic root ([place])    a. 03/2013 s/p Bentall procedure with Ao Root replacement (71mm Valsalva graft) w/ reimplantationof the R and L coronary ostium.  Marland Kitchen Dysrhythmia    A. FIB  . Fibromyalgia   . Headache(784.0)    hx migraines  . Hypothyroidism   . Intracranial aneurysm    in late 1970's  . 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 Neoadjuvant RT by Dr. Yisroel Ramming. Surveillance plan: MRI 12/08/11 shows changes  in presumed post-op seroma/hematoma with slight increase in size - follow up MRI 11/18/12 showed same size. Will continue with q3 month MRI. CT C/A/P annually alternating with CXR at 6 months.   . Osteoporosis   . Paroxysmal A-fib ([place])    a. Dx 01/2013 - placed on xarelto;  b. 03/2013 s/p left sided MAZE and LA clip @ time of AVR/CABG.  . Peripheral neuropathy   . PMR (polymyalgia rheumatica) (HCC)    Inactive  . Rheumatic heart disease   . 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 Vertigo     Past Surgical  History:  Procedure Laterality Date  . ABDOMINAL HYSTERECTOMY    . ASCENDING AORTIC ROOT REPLACEMENT N/A 03/31/2013   Procedure: ASCENDING AORTIC ROOT REPLACEMENT;  Surgeon: Grace Isaac, MD;  Location: [place];  Service: Open Heart Surgery;  Laterality: N/A; clip inserted.  Marland Kitchen BLADDER SUSPENSION    . CARDIOVERSION N/A 01/29/2017   Procedure: CARDIOVERSION;  Surgeon: Jerline Pain, MD;  Location: [place];  Service: Cardiovascular;  Laterality: N/A;  . CARDIOVERSION N/A 10/22/2017   Procedure: CARDIOVERSION;  Surgeon: Lelon Perla, MD;  Location: [facility] ENDOSCOPY;  Service: Cardiovascular;  Laterality: N/A;  . COLONOSCOPY  10/25/2011   Procedure: COLONOSCOPY;  Surgeon: Garlan Fair, MD;  [facility] ENDOSCOPY;  Service: Endoscopy;  Laterality: N/A;  . EYE SURGERY Bilateral    cataracts  . INTRAOPERATIVE TRANSESOPHAGEAL ECHOCARDIOGRAM N/A 03/31/2013   Procedure: INTRAOPERATIVE TRANSESOPHAGEAL ECHOCARDIOGRAM;  Surgeon: Grace Isaac, MD;  Location: [place];  Service: Open Heart Surgery;  Laterality: N/A;  . KYPHOPLASTY N/A 05/07/2018   Procedure: KYPHOPLASTY LUMBAR THREE;  Surgeon: Melina Schools, MD;  Location: [place];  Service: Orthopedics;  Laterality: N/A;  . KYPHOPLASTY N/A 05/14/2019   Procedure: KYPHOPLASTY T11;  Surgeon: Melina Schools, MD;  Location: [place];  Service: Orthopedics;  Laterality: N/A;  90 mins  . KYPHOPLASTY N/A 07/08/2019   Procedure: KYPHOPLASTY THORACIC TWELVE;  Surgeon: Melina Schools, MD;  Location: [place];  Service: Orthopedics;  Laterality: N/A;  60 mins  . LEFT AND RIGHT HEART CATHETERIZATION WITH CORONARY ANGIOGRAM Right 02/02/2013   Procedure: LEFT AND RIGHT HEART CATHETERIZATION WITH CORONARY ANGIOGRAM;  Surgeon: Hillary Bow, MD;  Location: [facility] CATH LAB;  Service: Cardiovascular;  Laterality: Right;  Marland Kitchen MAZE N/A 03/31/2013   Procedure: MAZE;  Surgeon: Grace Isaac, MD;  Location: [place];  Service: Open Heart Surgery;  Laterality: N/A;  . Sarcoma  resection  2011    Social History   Socioeconomic History  . Marital status: Married    Spouse name: Not on file  . Number of children: Not on file  . Years of education: Not on file  . Highest education level: Not on file  Occupational History  . Not on file  Tobacco Use  . Smoking status: Never Smoker  . Smokeless tobacco: Never Used  Vaping Use  . Vaping Use: Never used  Substance and Sexual Activity  . Alcohol use: No  . Drug use: No  . Sexual activity: Yes    Birth control/protection: Post-menopausal  Other Topics Concern  . Not on file  Social History Narrative  . Not on file   Social Determinants of Health   Financial Resource Strain:   . Difficulty of Paying Living Expenses:   Food Insecurity:   . Worried About Charity fundraiser in the Last Year:   . Ran  Out of Food in the Last Year:   Transportation Needs:   . Lack of Transportation (Medical):   Marland Kitchen Lack of Transportation (Non-Medical):   Physical Activity:   . Days of Exercise per Week:   . Minutes of Exercise per Session:   Stress:   . Feeling of Stress :   Social Connections:   . Frequency of Communication with Friends and Family:   . Frequency of Social Gatherings with Friends and Family:   . Attends Religious Services:   . Active Member of Clubs or Organizations:   . Attends Archivist Meetings:   Marland Kitchen Marital Status:   Intimate Partner Violence:   . Fear of Current or Ex-Partner:   . Emotionally Abused:   Marland Kitchen Physically Abused:   . Sexually Abused:     Family History  Problem Relation Age of Onset  . Heart disease Mother   . Stroke Mother   . Arthritis Father   . Cancer Father   . Anesthesia problems Brother   . Hypertension Sister     ROS: no fevers or chills, productive cough, hemoptysis, dysphasia, odynophagia, melena, hematochezia, dysuria, hematuria, rash, seizure activity, orthopnea, PND, pedal edema, claudication. Remaining systems are negative.  Physical  Exam: Well-developed well-nourished in no acute distress.  Skin is warm and dry.  HEENT is normal.  Neck is supple.  Chest is clear to auscultation with normal expansion.  Cardiovascular exam is regular rate and rhythm.  2/6 systolic murmur left sternal border. Abdominal exam nontender or distended. No masses palpated. Extremities show no edema. neuro grossly intact  ECG-sinus rhythm with PACs, nonspecific T wave changes.  Personally reviewed  A/P  1 PAF-pt remains in sinus; continue amiodarone; check TSH, LFTs and chest xray.  Continue apixaban.  Check hemoglobin and renal function.  She has had no further falls.  2 coronary artery disease-patient denies chest pain.  Continue statin.  No aspirin given need for apixaban.  3 prior aortic valve replacement-continue SBE prophylaxis.  We will likely repeat echocardiogram when she returns in 6 months.  4 hypertension-blood pressure controlled.  Continue present medications.  5 chronic diastolic congestive heart failure-volume status appears to be stable.  As outlined in previous notes for volume excess in the past was related primarily to recurrent atrial fibrillation.  We will decrease Lasix to 20 mg daily and follow-up for worsening edema.  6 tricuspid regurgitation-conservative measures given age and overall medical condition.  6 hyperlipidemia-continue statin.  [name], MD

## 2020-05-24 ENCOUNTER — Other Ambulatory Visit: Payer: Self-pay

## 2020-05-24 ENCOUNTER — Encounter: Payer: Self-pay | Admitting: Cardiology

## 2020-05-24 ENCOUNTER — Ambulatory Visit (INDEPENDENT_AMBULATORY_CARE_PROVIDER_SITE_OTHER): Payer: Medicare Other | Admitting: Cardiology

## 2020-05-24 ENCOUNTER — Ambulatory Visit: Payer: Medicare Other | Admitting: Cardiology

## 2020-05-24 VITALS — BP 128/64 | HR 62 | Ht 65.0 in | Wt 133.4 lb

## 2020-05-24 DIAGNOSIS — I2510 Atherosclerotic heart disease of native coronary artery without angina pectoris: Secondary | ICD-10-CM

## 2020-05-24 DIAGNOSIS — I10 Essential (primary) hypertension: Secondary | ICD-10-CM

## 2020-05-24 DIAGNOSIS — I480 Paroxysmal atrial fibrillation: Secondary | ICD-10-CM | POA: Diagnosis not present

## 2020-05-24 DIAGNOSIS — I5032 Chronic diastolic (congestive) heart failure: Secondary | ICD-10-CM | POA: Diagnosis not present

## 2020-05-24 MED ORDER — FUROSEMIDE 40 MG PO TABS: ORAL_TABLET | ORAL | 3 refills | Status: DC

## 2020-05-24 NOTE — Patient Instructions (Signed)
Medication Instructions:   DECREASE FUROSEMIDE TO 20 MG ONCE DAILY AND TAKE AN EXTRA 1 TABLET AS NEEDED FOR SWELLING  *If you need a refill on your cardiac medications before your next appointment, please call your pharmacy*   Lab Work: Your physician recommends that you HAVE LAB WORK TODAY  If you have labs (blood work) drawn today and your tests are completely normal, you will receive your results only by: Marland Kitchen MyChart Message (if you have MyChart) OR . A paper copy in the mail If you have any lab test that is abnormal or we need to change your treatment, we will call you to review the results.   Testing/Procedures: A chest x-ray takes a picture of the organs and structures inside the chest, including the heart, lungs, and blood vessels. This test can show several things, including, whether the heart is enlarges; whether fluid is building up in the lungs; and whether pacemaker / defibrillator leads are still in place. [address]   Follow-Up: At [facility], you and your health needs are our priority.  As part of our continuing mission to provide you with exceptional heart care, we have created designated Provider Care Teams.  These Care Teams include your primary Cardiologist (physician) and Advanced Practice Providers (APPs -  Physician Assistants and Nurse Practitioners) who all work together to provide you with the care you need, when you need it.  We recommend signing up for the patient portal called "MyChart".  Sign up information is provided on this After Visit Summary.  MyChart is used to connect with patients for Virtual Visits (Telemedicine).  Patients are able to view lab/test results, encounter notes, upcoming appointments, etc.  Non-urgent messages can be sent to your provider as well.   To learn more about what you can do with MyChart, go to [email or web address].    Your next appointment:   6 month(s)  The format for your next appointment:    Either In Person or Virtual  Provider:   You may see [name], MD or one of the following Advanced Practice Providers on your designated Care Team:    Kerin Ransom, PA-C  [place], [place]  Coletta Memos, [place]

## 2020-05-25 LAB — TSH: TSH: 4.2 u[IU]/mL (ref 0.450–4.500)

## 2020-05-25 LAB — COMPREHENSIVE METABOLIC PANEL WITH GFR
ALT: 18 IU/L (ref 0–32)
AST: 23 IU/L (ref 0–40)
Albumin/Globulin Ratio: 1.9 (ref 1.2–2.2)
Albumin: 4.4 g/dL (ref 3.5–4.6)
Alkaline Phosphatase: 107 IU/L (ref 48–121)
BUN/Creatinine Ratio: 16 (ref 12–28)
BUN: 15 mg/dL (ref 10–36)
Bilirubin Total: 0.2 mg/dL (ref 0.0–1.2)
CO2: 24 mmol/L (ref 20–29)
Calcium: 9.5 mg/dL (ref 8.7–10.3)
Chloride: 104 mmol/L (ref 96–106)
Creatinine, Ser: 0.91 mg/dL (ref 0.57–1.00)
GFR calc Af Amer: 64 mL/min/1.73
GFR calc non Af Amer: 56 mL/min/1.73 — ABNORMAL LOW
Globulin, Total: 2.3 g/dL (ref 1.5–4.5)
Glucose: 81 mg/dL (ref 65–99)
Potassium: 5.4 mmol/L — ABNORMAL HIGH (ref 3.5–5.2)
Sodium: 141 mmol/L (ref 134–144)
Total Protein: 6.7 g/dL (ref 6.0–8.5)

## 2020-05-25 LAB — CBC
Hematocrit: 34.2 % (ref 34.0–46.6)
Hemoglobin: 12.7 g/dL (ref 11.1–15.9)
MCH: 37.5 pg — ABNORMAL HIGH (ref 26.6–33.0)
MCHC: 37.1 g/dL — ABNORMAL HIGH (ref 31.5–35.7)
MCV: 101 fL — ABNORMAL HIGH (ref 79–97)
Platelets: 156 10*3/uL (ref 150–450)
RBC: 3.39 x10E6/uL — ABNORMAL LOW (ref 3.77–5.28)
RDW: 12.7 % (ref 11.7–15.4)
WBC: 4.9 10*3/uL (ref 3.4–10.8)

## 2020-05-31 ENCOUNTER — Ambulatory Visit
Admission: RE | Admit: 2020-05-31 | Discharge: 2020-05-31 | Disposition: A | Discharge status: Home / Self Care | Payer: Medicare Other | Source: Ambulatory Visit | Attending: Cardiology | Admitting: Cardiology

## 2020-05-31 ENCOUNTER — Other Ambulatory Visit: Payer: Self-pay

## 2020-05-31 DIAGNOSIS — I480 Paroxysmal atrial fibrillation: Secondary | ICD-10-CM

## 2020-06-01 ENCOUNTER — Telehealth: Payer: Self-pay | Admitting: Cardiology

## 2020-06-01 NOTE — Telephone Encounter (Signed)
Kayla Perla, MD  05/31/2020 3:42 PM EDT     No amiodarone toxicity [name]   Pt husband informed of providers result & recommendations. Pt verbalized understanding. No further questions .

## 2020-06-01 NOTE — Telephone Encounter (Signed)
    Pt's husband returning call from [facility] to get chest x-ray result

## 2020-06-06 DIAGNOSIS — M810 Age-related osteoporosis without current pathological fracture: Secondary | ICD-10-CM | POA: Diagnosis not present

## 2020-06-13 ENCOUNTER — Ambulatory Visit: Payer: Medicare Other | Admitting: Cardiology

## 2020-06-17 DIAGNOSIS — M810 Age-related osteoporosis without current pathological fracture: Secondary | ICD-10-CM | POA: Diagnosis not present

## 2020-06-17 DIAGNOSIS — I5032 Chronic diastolic (congestive) heart failure: Secondary | ICD-10-CM | POA: Diagnosis not present

## 2020-06-17 DIAGNOSIS — E7800 Pure hypercholesterolemia, unspecified: Secondary | ICD-10-CM | POA: Diagnosis not present

## 2020-06-17 DIAGNOSIS — I110 Hypertensive heart disease with heart failure: Secondary | ICD-10-CM | POA: Diagnosis not present

## 2020-06-17 DIAGNOSIS — I2510 Atherosclerotic heart disease of native coronary artery without angina pectoris: Secondary | ICD-10-CM | POA: Diagnosis not present

## 2020-06-17 DIAGNOSIS — I10 Essential (primary) hypertension: Secondary | ICD-10-CM | POA: Diagnosis not present

## 2020-06-17 DIAGNOSIS — I480 Paroxysmal atrial fibrillation: Secondary | ICD-10-CM | POA: Diagnosis not present

## 2020-06-17 DIAGNOSIS — E039 Hypothyroidism, unspecified: Secondary | ICD-10-CM | POA: Diagnosis not present

## 2020-07-07 DIAGNOSIS — M810 Age-related osteoporosis without current pathological fracture: Secondary | ICD-10-CM | POA: Diagnosis not present

## 2020-07-16 ENCOUNTER — Other Ambulatory Visit: Payer: Self-pay | Admitting: Cardiology

## 2020-07-16 DIAGNOSIS — E785 Hyperlipidemia, unspecified: Secondary | ICD-10-CM

## 2020-07-19 DIAGNOSIS — N958 Other specified menopausal and perimenopausal disorders: Secondary | ICD-10-CM | POA: Diagnosis not present

## 2020-07-19 DIAGNOSIS — M816 Localized osteoporosis [Lequesne]: Secondary | ICD-10-CM | POA: Diagnosis not present

## 2020-07-31 ENCOUNTER — Inpatient Hospital Stay (HOSPITAL_COMMUNITY)
Admission: EM | Admit: 2020-07-31 | Discharge: 2020-08-02 | DRG: 872 | Disposition: A | Discharge status: Home / Self Care | Payer: Medicare Other | Attending: Internal Medicine | Admitting: Internal Medicine

## 2020-07-31 ENCOUNTER — Encounter (HOSPITAL_COMMUNITY): Payer: Self-pay | Admitting: Family Medicine

## 2020-07-31 ENCOUNTER — Other Ambulatory Visit: Payer: Self-pay

## 2020-07-31 ENCOUNTER — Emergency Department (HOSPITAL_COMMUNITY): Payer: Medicare Other

## 2020-07-31 DIAGNOSIS — N3000 Acute cystitis without hematuria: Secondary | ICD-10-CM | POA: Diagnosis not present

## 2020-07-31 DIAGNOSIS — M5489 Other dorsalgia: Secondary | ICD-10-CM | POA: Diagnosis not present

## 2020-07-31 DIAGNOSIS — R1031 Right lower quadrant pain: Secondary | ICD-10-CM | POA: Diagnosis not present

## 2020-07-31 DIAGNOSIS — K7460 Unspecified cirrhosis of liver: Secondary | ICD-10-CM | POA: Diagnosis present

## 2020-07-31 DIAGNOSIS — M353 Polymyalgia rheumatica: Secondary | ICD-10-CM | POA: Diagnosis present

## 2020-07-31 DIAGNOSIS — R933 Abnormal findings on diagnostic imaging of other parts of digestive tract: Secondary | ICD-10-CM | POA: Diagnosis not present

## 2020-07-31 DIAGNOSIS — Z85831 Personal history of malignant neoplasm of soft tissue: Secondary | ICD-10-CM

## 2020-07-31 DIAGNOSIS — R1084 Generalized abdominal pain: Secondary | ICD-10-CM | POA: Diagnosis not present

## 2020-07-31 DIAGNOSIS — I5032 Chronic diastolic (congestive) heart failure: Secondary | ICD-10-CM | POA: Diagnosis present

## 2020-07-31 DIAGNOSIS — I5033 Acute on chronic diastolic (congestive) heart failure: Secondary | ICD-10-CM | POA: Diagnosis not present

## 2020-07-31 DIAGNOSIS — K449 Diaphragmatic hernia without obstruction or gangrene: Secondary | ICD-10-CM | POA: Diagnosis present

## 2020-07-31 DIAGNOSIS — Z7901 Long term (current) use of anticoagulants: Secondary | ICD-10-CM

## 2020-07-31 DIAGNOSIS — I480 Paroxysmal atrial fibrillation: Secondary | ICD-10-CM | POA: Diagnosis present

## 2020-07-31 DIAGNOSIS — E039 Hypothyroidism, unspecified: Secondary | ICD-10-CM | POA: Diagnosis present

## 2020-07-31 DIAGNOSIS — Z8261 Family history of arthritis: Secondary | ICD-10-CM

## 2020-07-31 DIAGNOSIS — K5730 Diverticulosis of large intestine without perforation or abscess without bleeding: Secondary | ICD-10-CM | POA: Diagnosis present

## 2020-07-31 DIAGNOSIS — A4151 Sepsis due to Escherichia coli [E. coli]: Secondary | ICD-10-CM | POA: Diagnosis present

## 2020-07-31 DIAGNOSIS — I10 Essential (primary) hypertension: Secondary | ICD-10-CM | POA: Diagnosis not present

## 2020-07-31 DIAGNOSIS — E785 Hyperlipidemia, unspecified: Secondary | ICD-10-CM | POA: Diagnosis present

## 2020-07-31 DIAGNOSIS — G629 Polyneuropathy, unspecified: Secondary | ICD-10-CM | POA: Diagnosis present

## 2020-07-31 DIAGNOSIS — G2581 Restless legs syndrome: Secondary | ICD-10-CM | POA: Diagnosis present

## 2020-07-31 DIAGNOSIS — Z809 Family history of malignant neoplasm, unspecified: Secondary | ICD-10-CM

## 2020-07-31 DIAGNOSIS — D1803 Hemangioma of intra-abdominal structures: Secondary | ICD-10-CM | POA: Diagnosis not present

## 2020-07-31 DIAGNOSIS — Z79899 Other long term (current) drug therapy: Secondary | ICD-10-CM

## 2020-07-31 DIAGNOSIS — N390 Urinary tract infection, site not specified: Secondary | ICD-10-CM | POA: Diagnosis present

## 2020-07-31 DIAGNOSIS — R197 Diarrhea, unspecified: Secondary | ICD-10-CM | POA: Diagnosis not present

## 2020-07-31 DIAGNOSIS — M8008XA Age-related osteoporosis with current pathological fracture, vertebra(e), initial encounter for fracture: Secondary | ICD-10-CM | POA: Diagnosis present

## 2020-07-31 DIAGNOSIS — Z20822 Contact with and (suspected) exposure to covid-19: Secondary | ICD-10-CM | POA: Diagnosis present

## 2020-07-31 DIAGNOSIS — Z8249 Family history of ischemic heart disease and other diseases of the circulatory system: Secondary | ICD-10-CM

## 2020-07-31 DIAGNOSIS — Z952 Presence of prosthetic heart valve: Secondary | ICD-10-CM

## 2020-07-31 DIAGNOSIS — Z79890 Hormone replacement therapy (postmenopausal): Secondary | ICD-10-CM

## 2020-07-31 DIAGNOSIS — G43909 Migraine, unspecified, not intractable, without status migrainosus: Secondary | ICD-10-CM | POA: Diagnosis present

## 2020-07-31 DIAGNOSIS — I509 Heart failure, unspecified: Secondary | ICD-10-CM | POA: Diagnosis not present

## 2020-07-31 DIAGNOSIS — I77810 Thoracic aortic ectasia: Secondary | ICD-10-CM | POA: Diagnosis present

## 2020-07-31 DIAGNOSIS — Z884 Allergy status to anesthetic agent status: Secondary | ICD-10-CM

## 2020-07-31 DIAGNOSIS — R109 Unspecified abdominal pain: Secondary | ICD-10-CM | POA: Diagnosis not present

## 2020-07-31 DIAGNOSIS — I110 Hypertensive heart disease with heart failure: Secondary | ICD-10-CM | POA: Diagnosis present

## 2020-07-31 DIAGNOSIS — M810 Age-related osteoporosis without current pathological fracture: Secondary | ICD-10-CM | POA: Diagnosis present

## 2020-07-31 DIAGNOSIS — K838 Other specified diseases of biliary tract: Secondary | ICD-10-CM | POA: Diagnosis present

## 2020-07-31 DIAGNOSIS — M797 Fibromyalgia: Secondary | ICD-10-CM | POA: Diagnosis present

## 2020-07-31 DIAGNOSIS — A419 Sepsis, unspecified organism: Secondary | ICD-10-CM | POA: Diagnosis not present

## 2020-07-31 DIAGNOSIS — F419 Anxiety disorder, unspecified: Secondary | ICD-10-CM | POA: Diagnosis present

## 2020-07-31 DIAGNOSIS — F0390 Unspecified dementia without behavioral disturbance: Secondary | ICD-10-CM | POA: Diagnosis present

## 2020-07-31 DIAGNOSIS — I351 Nonrheumatic aortic (valve) insufficiency: Secondary | ICD-10-CM | POA: Diagnosis present

## 2020-07-31 DIAGNOSIS — C499 Malignant neoplasm of connective and soft tissue, unspecified: Secondary | ICD-10-CM | POA: Diagnosis present

## 2020-07-31 DIAGNOSIS — I4891 Unspecified atrial fibrillation: Secondary | ICD-10-CM | POA: Diagnosis not present

## 2020-07-31 DIAGNOSIS — Z66 Do not resuscitate: Secondary | ICD-10-CM | POA: Diagnosis present

## 2020-07-31 DIAGNOSIS — Z91011 Allergy to milk products: Secondary | ICD-10-CM

## 2020-07-31 DIAGNOSIS — I2510 Atherosclerotic heart disease of native coronary artery without angina pectoris: Secondary | ICD-10-CM | POA: Diagnosis present

## 2020-07-31 DIAGNOSIS — Z823 Family history of stroke: Secondary | ICD-10-CM

## 2020-07-31 DIAGNOSIS — I499 Cardiac arrhythmia, unspecified: Secondary | ICD-10-CM | POA: Diagnosis not present

## 2020-07-31 DIAGNOSIS — Z888 Allergy status to other drugs, medicaments and biological substances status: Secondary | ICD-10-CM

## 2020-07-31 DIAGNOSIS — M545 Low back pain: Secondary | ICD-10-CM | POA: Diagnosis not present

## 2020-07-31 DIAGNOSIS — R1010 Upper abdominal pain, unspecified: Secondary | ICD-10-CM | POA: Diagnosis not present

## 2020-07-31 DIAGNOSIS — R0902 Hypoxemia: Secondary | ICD-10-CM | POA: Diagnosis not present

## 2020-07-31 DIAGNOSIS — K839 Disease of biliary tract, unspecified: Secondary | ICD-10-CM | POA: Diagnosis not present

## 2020-07-31 LAB — COMPREHENSIVE METABOLIC PANEL
ALT: 18 U/L (ref 0–44)
ALT: 22 U/L (ref 0–44)
AST: 68 U/L — ABNORMAL HIGH (ref 15–41)
AST: 38 U/L (ref 15–41)
Albumin: 3.2 g/dL — ABNORMAL LOW (ref 3.5–5.0)
Albumin: 4.1 g/dL (ref 3.5–5.0)
Alkaline Phosphatase: 82 U/L (ref 38–126)
Alkaline Phosphatase: 105 U/L (ref 38–126)
Anion gap: 11 (ref 5–15)
Anion gap: 14 (ref 5–15)
BUN: 10 mg/dL (ref 8–23)
BUN: 11 mg/dL (ref 8–23)
CO2: 21 mmol/L — ABNORMAL LOW (ref 22–32)
CO2: 25 mmol/L (ref 22–32)
Calcium: 7.6 mg/dL — ABNORMAL LOW (ref 8.9–10.3)
Calcium: 9 mg/dL (ref 8.9–10.3)
Chloride: 103 mmol/L (ref 98–111)
Chloride: 99 mmol/L (ref 98–111)
Creatinine, Ser: 0.76 mg/dL (ref 0.44–1.00)
Creatinine, Ser: 0.64 mg/dL (ref 0.44–1.00)
GFR calc Af Amer: >60 mL/min (ref >60)
GFR calc Af Amer: >60 mL/min (ref >60)
GFR calc non Af Amer: >60 mL/min (ref >60)
GFR calc non Af Amer: >60 mL/min (ref >60)
Glucose, Bld: 82 mg/dL (ref 70–99)
Glucose, Bld: 87 mg/dL (ref 70–99)
Potassium: 6.3 mmol/L (ref 3.5–5.1)
Potassium: 3.8 mmol/L (ref 3.5–5.1)
Sodium: 135 mmol/L (ref 135–145)
Sodium: 138 mmol/L (ref 135–145)
Total Bilirubin: 2 mg/dL — ABNORMAL HIGH (ref 0.3–1.2)
Total Bilirubin: 0.7 mg/dL (ref 0.3–1.2)
Total Protein: 6.3 g/dL — ABNORMAL LOW (ref 6.5–8.1)
Total Protein: 7.7 g/dL (ref 6.5–8.1)

## 2020-07-31 LAB — CBC WITH DIFFERENTIAL/PLATELET
Abs Immature Granulocytes: 0.02 10*3/uL (ref 0.00–0.07)
Basophils Absolute: 0 10*3/uL (ref 0.0–0.1)
Basophils Relative: 0 %
Eosinophils Absolute: 0 10*3/uL (ref 0.0–0.5)
Eosinophils Relative: 0 %
HCT: 40.6 % (ref 36.0–46.0)
Hemoglobin: 13.1 g/dL (ref 12.0–15.0)
Immature Granulocytes: 0 %
Lymphocytes Relative: 7 %
Lymphs Abs: 0.6 10*3/uL — ABNORMAL LOW (ref 0.7–4.0)
MCH: 32.6 pg (ref 26.0–34.0)
MCHC: 32.3 g/dL (ref 30.0–36.0)
MCV: 101 fL — ABNORMAL HIGH (ref 80.0–100.0)
Monocytes Absolute: 0.4 10*3/uL (ref 0.1–1.0)
Monocytes Relative: 6 %
Neutro Abs: 6.6 10*3/uL (ref 1.7–7.7)
Neutrophils Relative %: 87 %
Platelets: 136 10*3/uL — ABNORMAL LOW (ref 150–400)
RBC: 4.02 MIL/uL (ref 3.87–5.11)
RDW: 13.6 % (ref 11.5–15.5)
WBC: 7.7 10*3/uL (ref 4.0–10.5)
nRBC: 0 % (ref 0.0–0.2)

## 2020-07-31 LAB — URINALYSIS, ROUTINE W REFLEX MICROSCOPIC
Bilirubin Urine: NEGATIVE
Glucose, UA: NEGATIVE mg/dL
Ketones, ur: 20 mg/dL — AB
Nitrite: POSITIVE — AB
Protein, ur: 30 mg/dL — AB
Specific Gravity, Urine: 1.017 (ref 1.005–1.030)
WBC, UA: >50 WBC/hpf — ABNORMAL HIGH (ref 0–5)
pH: 5 (ref 5.0–8.0)

## 2020-07-31 LAB — SARS CORONAVIRUS 2 BY RT PCR (HOSPITAL ORDER, PERFORMED IN ~~LOC~~ HOSPITAL LAB): SARS Coronavirus 2: NEGATIVE

## 2020-07-31 LAB — LIPASE, BLOOD: Lipase: 17 U/L (ref 11–51)

## 2020-07-31 LAB — LACTIC ACID, PLASMA: Lactic Acid, Venous: 1.9 mmol/L (ref 0.5–1.9)

## 2020-07-31 MED ORDER — POTASSIUM CHLORIDE CRYS ER 20 MEQ PO TBCR
20.0000 meq | EXTENDED_RELEASE_TABLET | Freq: Every day | ORAL | Status: DC
  Administered 2020-07-31 – 2020-08-02 (×3): 20 meq via ORAL
  Filled 2020-07-31 (×3): qty 1

## 2020-07-31 MED ORDER — ROPINIROLE HCL 0.5 MG PO TABS
0.2500 mg | ORAL_TABLET | Freq: Every day | ORAL | Status: DC
  Administered 2020-07-31 – 2020-08-01 (×2): 0.25 mg via ORAL
  Filled 2020-07-31 (×2): qty 1

## 2020-07-31 MED ORDER — SODIUM CHLORIDE 0.9 % IV BOLUS
500.0000 mL | Freq: Once | INTRAVENOUS | Status: AC
  Administered 2020-07-31: 500 mL via INTRAVENOUS

## 2020-07-31 MED ORDER — ADULT MULTIVITAMIN W/MINERALS CH
1.0000 | ORAL_TABLET | Freq: Every day | ORAL | Status: DC
  Administered 2020-07-31 – 2020-08-02 (×3): 1 via ORAL
  Filled 2020-07-31 (×3): qty 1

## 2020-07-31 MED ORDER — MORPHINE SULFATE (PF) 2 MG/ML IV SOLN
2.0000 mg | Freq: Once | INTRAVENOUS | Status: AC
  Administered 2020-07-31: 2 mg via INTRAVENOUS
  Filled 2020-07-31: qty 1

## 2020-07-31 MED ORDER — IOHEXOL 300 MG/ML  SOLN
100.0000 mL | Freq: Once | INTRAMUSCULAR | Status: AC | PRN
  Administered 2020-07-31: 100 mL via INTRAVENOUS

## 2020-07-31 MED ORDER — SODIUM CHLORIDE 0.9 % IV SOLN
1.0000 g | INTRAVENOUS | Status: DC
  Administered 2020-08-01: 1 g via INTRAVENOUS
  Filled 2020-07-31: qty 1
  Filled 2020-07-31: qty 10

## 2020-07-31 MED ORDER — ONDANSETRON HCL 4 MG/2ML IJ SOLN: 4.0000 mg | Freq: Four times a day (QID) | INTRAMUSCULAR | Status: DC | PRN

## 2020-07-31 MED ORDER — FUROSEMIDE 20 MG PO TABS
20.0000 mg | ORAL_TABLET | Freq: Every day | ORAL | Status: DC
  Administered 2020-07-31 – 2020-08-02 (×3): 20 mg via ORAL
  Filled 2020-07-31 (×3): qty 1

## 2020-07-31 MED ORDER — ALPRAZOLAM 0.5 MG PO TABS
0.5000 mg | ORAL_TABLET | Freq: Every day | ORAL | Status: DC
  Administered 2020-08-01: 0.5 mg via ORAL
  Filled 2020-07-31 (×2): qty 1

## 2020-07-31 MED ORDER — TRAZODONE HCL 50 MG PO TABS: 50.0000 mg | ORAL_TABLET | Freq: Every evening | ORAL | Status: DC | PRN

## 2020-07-31 MED ORDER — OXYCODONE HCL 5 MG PO TABS
5.0000 mg | ORAL_TABLET | ORAL | Status: DC | PRN
  Administered 2020-07-31 – 2020-08-01 (×2): 5 mg via ORAL
  Filled 2020-07-31 (×2): qty 1

## 2020-07-31 MED ORDER — LACTASE 3000 UNITS PO TABS
1.0000 | ORAL_TABLET | Freq: Every day | ORAL | Status: DC | PRN
  Filled 2020-07-31: qty 1

## 2020-07-31 MED ORDER — ACETAMINOPHEN 650 MG RE SUPP: 650.0000 mg | Freq: Four times a day (QID) | RECTAL | Status: DC | PRN

## 2020-07-31 MED ORDER — APIXABAN 2.5 MG PO TABS
2.5000 mg | ORAL_TABLET | Freq: Two times a day (BID) | ORAL | Status: DC
  Administered 2020-07-31 – 2020-08-02 (×4): 2.5 mg via ORAL
  Filled 2020-07-31 (×4): qty 1

## 2020-07-31 MED ORDER — LEVOTHYROXINE SODIUM 75 MCG PO TABS
75.0000 ug | ORAL_TABLET | Freq: Every day | ORAL | Status: DC
  Administered 2020-07-31 – 2020-08-01 (×2): 75 ug via ORAL
  Filled 2020-07-31 (×2): qty 1

## 2020-07-31 MED ORDER — ACETAMINOPHEN 325 MG PO TABS
650.0000 mg | ORAL_TABLET | Freq: Once | ORAL | Status: AC
  Administered 2020-07-31: 650 mg via ORAL
  Filled 2020-07-31: qty 2

## 2020-07-31 MED ORDER — METHOCARBAMOL 1000 MG/10ML IJ SOLN
500.0000 mg | Freq: Four times a day (QID) | INTRAVENOUS | Status: DC | PRN
  Filled 2020-07-31: qty 5

## 2020-07-31 MED ORDER — AMIODARONE HCL 200 MG PO TABS
200.0000 mg | ORAL_TABLET | Freq: Every day | ORAL | Status: DC
  Administered 2020-07-31 – 2020-08-02 (×3): 200 mg via ORAL
  Filled 2020-07-31 (×3): qty 1

## 2020-07-31 MED ORDER — SODIUM CHLORIDE 0.9 % IV SOLN
1.0000 g | Freq: Once | INTRAVENOUS | Status: AC
  Administered 2020-07-31: 1 g via INTRAVENOUS
  Filled 2020-07-31: qty 10

## 2020-07-31 MED ORDER — POLYETHYLENE GLYCOL 3350 17 G PO PACK: 17.0000 g | PACK | Freq: Every day | ORAL | Status: DC | PRN

## 2020-07-31 MED ORDER — ONDANSETRON HCL 4 MG PO TABS: 4.0000 mg | ORAL_TABLET | Freq: Four times a day (QID) | ORAL | Status: DC | PRN

## 2020-07-31 MED ORDER — ACETAMINOPHEN 325 MG PO TABS: 650.0000 mg | ORAL_TABLET | Freq: Four times a day (QID) | ORAL | Status: DC | PRN

## 2020-07-31 MED ORDER — PRAVASTATIN SODIUM 20 MG PO TABS
40.0000 mg | ORAL_TABLET | Freq: Every day | ORAL | Status: DC
  Administered 2020-07-31: 40 mg via ORAL
  Filled 2020-07-31: qty 2

## 2020-07-31 NOTE — ED Triage Notes (Signed)
Pt states that she had several episodes of diarrhea yesterday followed by profuse RLQ abd pain that kept her up all night. Pt was given 179mcg of fentanyl by EMS en route to this facility and pt reports pain at 1/10 at this time.

## 2020-07-31 NOTE — ED Provider Notes (Signed)
[facility] DEPT Provider Note   CSN: [id number] Arrival date & time: 07/31/20  0844     History Chief Complaint  Patient presents with  . Abdominal Pain    Kayla Chambers is a 86 y.o. female with PMHx HTN, CAD, CHF with EF 60-65%, Hypothyroidism, Fibromyalgia, PMR, and T11 compression fracture who presents to the ED today via EMS for complaint of sudden onset, constant, sharp, RLQ abdominal pain that began yesterday. Pt also complains of several episodes of diarrhea; no blood. She reports that a couple of days ago she had nausea and 1 episode of NBNB emesis but none since. She did not think much of it until she began having abdominal pain. Pt was given 100 mcg Fentanyl en route by EMS. She reports her pain is somewhat improved. PSHx to abdomen includes hysterectomy. Pt denies fevers, chills, chest pain, SOB, urinary sx, or any other associated symptoms.   The history is provided by the patient, medical records and the EMS personnel.       Past Medical History:  Diagnosis Date  . Anxiety    takes xanax for sleep.   . Aortic insufficiency    a. 03/2013 s/p  AVR w/ biologic 82mm Magna Ease peric tissue valve, model 300TFX, ser# [id number].   . Back pain    BACK BRACE/FRACTURE  . CAD (coronary artery disease)    a. 01/2013 mod calcification w/o sev dzs;  b. 03/2013 CABG x1 VG->RCA @ time of AVR  . Chronic diastolic CHF (congestive heart failure) ([place])    a. 01/2013 echo: EF 50-55%.  . Compression fracture of body of thoracic vertebra (HCC)    T-11  . Dementia ([place])   . Dilated aortic root ([place])    a. 03/2013 s/p Bentall procedure with Ao Root replacement (65mm Valsalva graft) w/ reimplantationof the R and L coronary ostium.  Marland Kitchen Dysrhythmia    A. FIB  . Fibromyalgia   . Headache(784.0)    hx migraines  . Hypothyroidism   . Intracranial aneurysm    in late 1970's  . 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 Neoadjuvant RT by Dr. Yisroel Ramming. Surveillance plan: MRI 12/08/11 shows changes in presumed post-op seroma/hematoma with slight increase in size - follow up MRI 11/18/12 showed same size. Will continue with q3 month MRI. CT C/A/P annually alternating with CXR at 6 months.   . Osteoporosis   . Paroxysmal A-fib ([place])    a. Dx 01/2013 - placed on xarelto;  b. 03/2013 s/p left sided MAZE and LA clip @ time of AVR/CABG.  . Peripheral neuropathy   . PMR (polymyalgia rheumatica) (HCC)    Inactive  . Rheumatic heart disease   . 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 Vertigo     Patient Active Problem List   Diagnosis Date Noted  . Abdominal pain 07/31/2020  . Thoracic compression fracture ([place]) 05/14/2019  . S/P kyphoplasty 05/07/2018  . Vertebral fracture, osteoporotic, initial encounter ([place]) 04/09/2018  . Compression fracture of vertebrae ([place]) 04/09/2018  . Rheumatic heart disease   . PMR (polymyalgia rheumatica) (HCC)   . Paroxysmal A-fib ([place])   . Intracranial aneurysm   . Fibromyalgia   . Anxiety   . H/O aortic valve replacement 02/02/2016  . Cerebrovascular disease 02/02/2016  . Hyperlipidemia 02/02/2016  . Vertigo 01/07/2015  .  Bronchitis 12/02/2013  . Malaise and fatigue 05/26/2013  . PAF (paroxysmal atrial fibrillation) ([place]) 04/08/2013  . HTN (hypertension) 04/08/2013  . Chronic diastolic CHF (congestive heart failure) ([place]) 04/08/2013  . CAD (coronary artery disease) 04/08/2013  . Acute on chronic diastolic heart failure ([place]) 01/31/2013  . Disorder of liver 10/07/2012  . Solitary pulmonary nodule 10/07/2012  . 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 Elevated blood pressure 09/12/2011  . Dizziness 08/09/2011  . Peripheral neuropathy 03/07/2011  . Aortic insufficiency 03/06/2011  . Dilated aortic root ([place]) 03/06/2011  . 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  Hypothyroidism 03/06/2011  . Polymyalgia rheumatica ([place]) 03/06/2011    Past Surgical History:  Procedure Laterality Date  . ABDOMINAL HYSTERECTOMY    . ASCENDING AORTIC ROOT REPLACEMENT N/A 03/31/2013   Procedure: ASCENDING AORTIC ROOT REPLACEMENT;  Surgeon: Grace Isaac, MD;  Location: [facility];  Service: Open Heart Surgery;  Laterality: N/A; clip inserted.  Marland Kitchen BLADDER SUSPENSION    . CARDIOVERSION N/A 01/29/2017   Procedure: CARDIOVERSION;  Surgeon: Jerline Pain, MD;  Location: [facility];  Service: Cardiovascular;  Laterality: N/A;  . CARDIOVERSION N/A 10/22/2017   Procedure: CARDIOVERSION;  Surgeon: Lelon Perla, MD;  Location: [facility] ENDOSCOPY;  Service: Cardiovascular;  Laterality: N/A;  . COLONOSCOPY  10/25/2011   Procedure: COLONOSCOPY;  Surgeon: Garlan Fair, MD;  Location: [facility] ENDOSCOPY;  Service: Endoscopy;  Laterality: N/A;  . EYE SURGERY Bilateral    cataracts  . INTRAOPERATIVE TRANSESOPHAGEAL ECHOCARDIOGRAM N/A 03/31/2013   Procedure: INTRAOPERATIVE TRANSESOPHAGEAL ECHOCARDIOGRAM;  Surgeon: Grace Isaac, MD;  Location: [facility];  Service: Open Heart Surgery;  Laterality: N/A;  . KYPHOPLASTY N/A 05/07/2018   Procedure: KYPHOPLASTY LUMBAR THREE;  Surgeon: Melina Schools, MD;  Location: [facility];  Service: Orthopedics;  Laterality: N/A;  . KYPHOPLASTY N/A 05/14/2019   Procedure: KYPHOPLASTY T11;  Surgeon: Melina Schools, MD;  Location: [facility];  Service: Orthopedics;  Laterality: N/A;  90 mins  . KYPHOPLASTY N/A 07/08/2019   Procedure: KYPHOPLASTY THORACIC TWELVE;  Surgeon: Melina Schools, MD;  Location: [facility];  Service: Orthopedics;  Laterality: N/A;  60 mins  . LEFT AND RIGHT HEART CATHETERIZATION WITH CORONARY ANGIOGRAM Right 02/02/2013   Procedure: LEFT AND RIGHT HEART CATHETERIZATION WITH CORONARY ANGIOGRAM;  Surgeon: Hillary Bow, MD;  Location: [facility] CATH LAB;  Service: Cardiovascular;  Laterality: Right;  Marland Kitchen MAZE N/A 03/31/2013   Procedure: MAZE;  Surgeon: Grace Isaac, MD;  Location: [facility];  Service: Open Heart Surgery;  Laterality: N/A;  . Sarcoma resection  2011     OB History   No obstetric history on file.     Family History  Problem Relation Age of Onset  . Heart disease Mother   . Stroke Mother   . Arthritis Father   . Cancer Father   . Anesthesia problems Brother   . Hypertension Sister     Social History   Tobacco Use  . Smoking status: Never Smoker  . Smokeless tobacco: Never Used  Vaping Use  . Vaping Use: Never used  Substance Use Topics  . Alcohol use: No  . Drug use: No    Home Medications Prior to Admission medications   Medication Sig Start Date End Date Taking? Authorizing Provider  acetaminophen (TYLENOL) 500 MG tablet Take 2 tablets (1,000 mg total) by mouth every 6 (six) hours as needed for mild pain (pain.). 05/14/19   Ward, Yvonne Kendall, PA-C  ALPRAZolam Duanne Moron) 0.25 MG  tablet Take 2 tablets (0.5 mg total) by mouth daily as needed for anxiety. 09/28/19   Lelon Perla, MD  ALPRAZolam Duanne Moron) 0.5 MG tablet Take 0.5 mg by mouth at bedtime. 07/05/20   [provider]  amiodarone (PACERONE) 200 MG tablet Take 1 tablet (200 mg total) by mouth daily. 09/07/19   Lelon Perla, MD  ELIQUIS 2.5 MG TABS tablet TAKE 1 TABLET TWICE A DAY 03/29/20   Lelon Perla, MD  furosemide (LASIX) 40 MG tablet Take 1 tablet daily and can take extra 1 tablet as needed for swelling 05/24/20   Lelon Perla, MD  lactase (LACTAID) 3000 UNITS tablet Take 1 tablet by mouth daily as needed (only when eating dairy).    [provider]  levothyroxine (SYNTHROID, LEVOTHROID) 75 MCG tablet Take 75 mcg by mouth at bedtime.     [provider]  loperamide (IMODIUM A-D) 2 MG tablet Take 2 mg by mouth 4 (four) times daily as needed for diarrhea or loose stools.    [provider]  polyethylene glycol (MIRALAX / GLYCOLAX) packet Take 17 g by mouth daily. 04/11/18   Raiford Noble Latif, DO  potassium  chloride SA (KLOR-CON) 20 MEQ tablet Take 20 mEq by mouth daily. 05/05/20   [provider]  pravastatin (PRAVACHOL) 40 MG tablet TAKE 1 TABLET(40 MG) BY MOUTH EVERY EVENING 07/18/20   Lelon Perla, MD  rOPINIRole (REQUIP) 0.25 MG tablet Take 0.25 mg by mouth at bedtime.     [provider]    Allergies    Alendronate sodium, Ambien [zolpidem tartrate], Lac bovis, Lyrica [pregabalin], Milk-related compounds, Neurontin [gabapentin], Procaine hcl, and Toprol xl [metoprolol succinate]  Review of Systems   Review of Systems  Constitutional: Negative for chills and fever.  Respiratory: Negative for shortness of breath.   Cardiovascular: Negative for chest pain.  Gastrointestinal: Positive for abdominal pain, diarrhea, nausea and vomiting (resolved).  Genitourinary: Negative for difficulty urinating, dysuria, flank pain and frequency.  All other systems reviewed and are negative.   Physical Exam Updated Vital Signs BP (!) 169/85 (BP Location: Right Arm)   Pulse 79   Temp 100 F (37.8 C) (Oral)   Resp 17   Ht 5\' 5"  (1.651 m)   Wt 59.9 kg   LMP  (LMP Unknown)   SpO2 94%   BMI 21.97 kg/m   Physical Exam Vitals and nursing note reviewed.  Constitutional:      Appearance: She is not ill-appearing or diaphoretic.  HENT:     Head: Normocephalic and atraumatic.  Eyes:     Conjunctiva/sclera: Conjunctivae normal.  Cardiovascular:     Rate and Rhythm: Normal rate and regular rhythm.     Heart sounds: Normal heart sounds.  Pulmonary:     Effort: Pulmonary effort is normal.     Breath sounds: Normal breath sounds. No wheezing, rhonchi or rales.  Abdominal:     Palpations: Abdomen is soft.     Tenderness: There is generalized abdominal tenderness. There is guarding (voluntary). There is no right CVA tenderness, left CVA tenderness or rebound.  Musculoskeletal:     Cervical back: Neck supple.  Skin:    General: Skin is warm and dry.  Neurological:     Mental  Status: She is alert.     ED Results / Procedures / Treatments   Labs (all labs ordered are listed, but only abnormal results are displayed) Labs Reviewed  COMPREHENSIVE METABOLIC PANEL - Abnormal; Notable for  the following components:      Result Value   Potassium 6.3 (*)    CO2 21 (*)    Calcium 7.6 (*)    Total Protein 6.3 (*)    Albumin 3.2 (*)    AST 68 (*)    Total Bilirubin 2.0 (*)    All other components within normal limits  URINALYSIS, ROUTINE W REFLEX MICROSCOPIC - Abnormal; Notable for the following components:   Color, Urine AMBER (*)    APPearance CLOUDY (*)    Hgb urine dipstick MODERATE (*)    Ketones, ur 20 (*)    Protein, ur 30 (*)    Nitrite POSITIVE (*)    Leukocytes,Ua LARGE (*)    WBC, UA >50 (*)    Bacteria, UA RARE (*)    Non Squamous Epithelial 0-5 (*)    All other components within normal limits  CBC WITH DIFFERENTIAL/PLATELET - Abnormal; Notable for the following components:   MCV 101.0 (*)    Platelets 136 (*)    Lymphs Abs 0.6 (*)    All other components within normal limits  SARS CORONAVIRUS 2 BY RT PCR (HOSPITAL ORDER, [place] [facility])  CULTURE, BLOOD (ROUTINE X 2)  CULTURE, BLOOD (ROUTINE X 2)  URINE CULTURE  LIPASE, BLOOD  COMPREHENSIVE METABOLIC PANEL  LACTIC ACID, PLASMA  LACTIC ACID, PLASMA    EKG None  Radiology CT Abdomen Pelvis W Contrast  Result Date: 07/31/2020 CLINICAL DATA:  86 year old who had multiple episodes of diarrhea yesterday which was followed by severe RIGHT LOWER QUADRANT abdominal pain which kept her from sleeping last night. Surgical history includes abdominal hysterectomy and cystic pexy. 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 EXAM: CT ABDOMEN AND PELVIS WITH CONTRAST TECHNIQUE: Multidetector CT imaging of the abdomen and pelvis was performed using the standard protocol following bolus administration of intravenous contrast. CONTRAST:  150mL  OMNIPAQUE IOHEXOL 300 MG/ML IV. COMPARISON:  10/22/2019 and earlier. FINDINGS: Lower chest: Scarring in the lower lobes, RIGHT MIDDLE LOBE and lingula, unchanged. RIGHT LOWER LOBE bronchiectasis perhaps slightly increased since the prior examinations. Visualized lung bases otherwise clear. Heart moderately enlarged but stable with RIGHT atrial enlargement in particular. Hepatobiliary: Mildly irregular hepatic contour and relative enlargement of the LEFT lobe as noted previously. Stable 1.6 cm benign hemangioma involving the LEFT lobe of the liver. Progressive intra and extrahepatic biliary ductal dilation since the December, 2020 CT, though I do not identify an obstructing stone or mass. The common bile duct measures up to approximately 13 mm diameter. Mild gallbladder distension. No CT evidence of cholelithiasis or pericholecystic inflammation. Pancreas: Mild to moderate atrophy. Mild of the pancreatic duct in the head and proximal body up to approximately 4 mm. No mass or peripancreatic inflammation. Spleen: Normal in size and appearance. Adrenals/Urinary Tract: Normal appearing adrenal glands. Benign approximate 8 mm cortical cyst arising from the LOWER pole of the RIGHT kidney, unchanged. No significant focal parenchymal abnormality involving either kidney. No hydronephrosis. No urinary tract calculi on either side. Normal appearing urinary bladder. Stomach/Bowel: Small hiatal hernia, unchanged. Otherwise normal appearing decompressed J-shaped stomach. Normal-appearing small bowel. Mobile cecum positioned in the RIGHT UPPER QUADRANT of the abdomen extending to the midline. Expected colonic stool burden. Extensive sigmoid colon diverticulosis without evidence of acute diverticulitis. Remainder of the colon tortuous and elongated without focal abnormality elsewhere. Appendix is inconspicuous, but no evidence of pericecal inflammation. Vascular/Lymphatic: Severe aortoiliofemoral atherosclerosis. No evidence of  abdominal aortic aneurysm. Approximate 2.0 cm aneurysm involving the LEFT common iliac artery. Normal-appearing portal venous and systemic venous systems. No pathologic lymphadenopathy. Reproductive: Surgically absent uterus.  No adnexal masses. Other: None. Musculoskeletal: Osseous demineralization. Augmentation of vertebral body fractures at T11, T12 and L3. Compression fractures of L1 and L4, unchanged. Severe spinal stenosis at the L3 level due to the retropulsion of the compression fracture at this level, unchanged. No acute findings. IMPRESSION: 1. Progressive intra and extrahepatic biliary ductal dilation since the December, 2020 CT, without evidence of an obstructing stone or mass. ERCP or MRCP may be helpful in further evaluation to exclude an occult ampullary mass. 2. Stable CT findings of hepatic cirrhosis. Stable benign hemangioma involving the LEFT hepatic lobe. 3. Extensive sigmoid colon diverticulosis without evidence of acute diverticulitis. 4. Stable small hiatal hernia. 5. Stable mild pancreatic atrophy. 6. 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 Aortic Atherosclerosis (ICD10-I70.0). Electronically Signed   By: Evangeline Dakin M.D.   On: 07/31/2020 12:26    Procedures Procedures (including critical care time)  Medications Ordered in ED Medications  sodium chloride 0.9 % bolus 500 mL (500 mLs Intravenous New Bag/Given 07/31/20 1314)  morphine 2 MG/ML injection 2 mg (2 mg Intravenous Given 07/31/20 1250)  iohexol (OMNIPAQUE) 300 MG/ML solution 100 mL (100 mLs Intravenous Contrast Given 07/31/20 1150)  acetaminophen (TYLENOL) tablet 650 mg (650 mg Oral Given 07/31/20 1300)  cefTRIAXone (ROCEPHIN) 1 g in sodium chloride 0.9 % 100 mL IVPB (1 g Intravenous New Bag/Given 07/31/20 1314)    ED Course  I have reviewed the triage vital signs and the nursing notes.  Pertinent labs & imaging results that were available during my care of the patient were reviewed by me and  considered in my medical decision making (see chart for details).    MDM Rules/Calculators/A&P                          86 year old female who presents to the ED today with complaint of right lower quadrant abdominal pain and diarrhea that began yesterday, several episodes.  Did have nausea and vomiting earlier in the week.  He was given 100 mcg of fentanyl in route with mild improvement in her symptoms.  On arrival to the ED patient's oral temp is noted to be 100.0.  Nontachycardic and nontachypneic.  She appears to be in no acute distress.  She does have diffuse abdominal tenderness palpation with voluntary guarding.  Plan for rectal temperature at this time.  We will plan for CT abdomen and pelvis.  Lab work is obtained prior to being seen, potassium has returned at 6.3 however it is suspected that this is hemolyzed.  We will plan for repeat.  CBC without leukocytosis, hemoglobin stable at 13.1.  Lipase within normal limits at 17.  We will plan for fluids and small dose of pain medication.  We will continue to monitor in the ED.  Will test for Covid in case any surgical repair needs to be done on the abdomen.   Rectal temp 101.2; tylenol provided. Will add lactic acid at this time.  Repeat CMP with normal potassium 3.8; no other electrolyte abnormalities. LFTs within normal limits.   U/A with leuks, nitrites, > 50 WBC, 11-20 RBCs, rare bacteria. Will await CT to further assess for infection prior to abx.   CT scan: IMPRESSION:  1. Progressive intra and extrahepatic biliary ductal dilation since  the December, 2020  CT, without evidence of an obstructing stone or  mass. ERCP or MRCP may be helpful in further evaluation to exclude  an occult ampullary mass.  2. Stable CT findings of hepatic cirrhosis. Stable benign hemangioma  involving the LEFT hepatic lobe.  3. Extensive sigmoid colon diverticulosis without evidence of acute  diverticulitis.  4. Stable small hiatal hernia.  5. Stable mild  pancreatic atrophy.  6. 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   Will treat for UTI at this time. I do feel given age, fever, and infection that pt would benefit from admission at this time. She will need to be worked up further for biliary ductal dilation.   Discussed case with Dr. Dione Plover who agrees to evaluate patient for admission.   This note was prepared using Dragon voice recognition software and may include unintentional dictation errors due to the inherent limitations of voice recognition software.  Final Clinical Impression(s) / ED Diagnoses Final diagnoses:  Acute cystitis without hematuria  Generalized abdominal pain  Diarrhea, unspecified type  Dilation of biliary tract    Rx / DC Orders ED Discharge Orders    None       Eustaquio Maize, PA-C 07/31/20 1346    Truddie Hidden, MD 07/31/20 1954

## 2020-07-31 NOTE — H&P (Signed)
[facility] History and Physical  GISSELA BLOCH IWL:[id number] DOB: 23-Jul-1927 DOA: 07/31/2020  Referring physician: Eustaquio Maize, PA-C PCP: Donald Prose, MD   Chief Complaint: Abdominal pain  HPI: Kayla Chambers is a 87 y.o. female with history of hypertension, CAD, chronic diastolic CHF, aortic valve replacement, paroxysmal A. fib, osteoporosis, polymyalgia rheumatica, who presents with abdominal pain.  Patient reports that she awoke this morning and had severe onset of sudden right lower quadrant abdominal pain.  She said the sensation made her think she was having appendicitis.  Additional symptoms include significant amount of diarrhea yesterday but none today, diarrhea was nonbloody, denies any associated nausea or vomiting.  She endorses increased urinary frequency but denies any burning or pain with urination.  She denies feeling fevers or chills but she was told on arrival that she had a fever.  She denies missing any of her medications recently, says that her husband helps manage all of her medications.  In the ED initial vital signs notable for fever to 101.2 Fahrenheit and tachypnea to 22, other vital signs normal.  Labs largely unremarkable, with normal CMP and CBC, normal lactic acid, and negative Covid test.  UA suspicious for infection with large leukocytes and positive nitrites.  Blood and urine cultures were obtained.  CT abdomen and pelvis was obtained which showed intra and extrahepatic biliary ductal dilation progressive since prior CT from December 2020, stable findings of hepatic cirrhosis and a benign hemangioma in the left hepatic lobe, no significant findings in the kidneys or bladder.  She was treated symptomatically for pain, and started on ceftriaxone for presumed urinary tract infection.  She reports her pain is improved significantly since she first arrived in the ED.  She was admitted for further management of her sepsis.  Review of Systems:  Pertinent  positives and negative per HPI, all others reviewed and negative  Past Medical History:  Diagnosis Date  . Anxiety    takes xanax for sleep.   . Aortic insufficiency    a. 03/2013 s/p  AVR w/ biologic 51mm Magna Ease peric tissue valve, model 300TFX, ser# [id number].   . Back pain    BACK BRACE/FRACTURE  . CAD (coronary artery disease)    a. 01/2013 mod calcification w/o sev dzs;  b. 03/2013 CABG x1 VG->RCA @ time of AVR  . Chronic diastolic CHF (congestive heart failure) ([place])    a. 01/2013 echo: EF 50-55%.  . Compression fracture of body of thoracic vertebra (HCC)    T-11  . Dementia ([place])   . Dilated aortic root ([place])    a. 03/2013 s/p Bentall procedure with Ao Root replacement (32mm Valsalva graft) w/ reimplantationof the R and L coronary ostium.  Marland Kitchen Dysrhythmia    A. FIB  . Fibromyalgia   . Headache(784.0)    hx migraines  . Hypothyroidism   . Intracranial aneurysm    in late 1970's  . 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 Neoadjuvant RT by Dr. Yisroel Ramming. Surveillance plan: MRI 12/08/11 shows changes in presumed post-op seroma/hematoma with slight increase in size - follow up MRI 11/18/12 showed same size. Will continue with q3 month MRI. CT C/A/P annually alternating with CXR at 6 months.   . Osteoporosis   . Paroxysmal A-fib ([place])    a. Dx 01/2013 - placed on xarelto;  b. 03/2013 s/p left sided MAZE and LA clip @ time of AVR/CABG.  Marland Kitchen  Peripheral neuropathy   . PMR (polymyalgia rheumatica) (HCC)    Inactive  . Rheumatic heart disease   . 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 Vertigo    Past Surgical History:  Procedure Laterality Date  . ABDOMINAL HYSTERECTOMY    . ASCENDING AORTIC ROOT REPLACEMENT N/A 03/31/2013   Procedure: ASCENDING AORTIC ROOT REPLACEMENT;  Surgeon: Grace Isaac, MD;  Location: [place];  Service: Open Heart Surgery;   Laterality: N/A; clip inserted.  Marland Kitchen BLADDER SUSPENSION    . CARDIOVERSION N/A 01/29/2017   Procedure: CARDIOVERSION;  Surgeon: Jerline Pain, MD;  Location: [place];  Service: Cardiovascular;  Laterality: N/A;  . CARDIOVERSION N/A 10/22/2017   Procedure: CARDIOVERSION;  Surgeon: Lelon Perla, MD;  Location: [facility] ENDOSCOPY;  Service: Cardiovascular;  Laterality: N/A;  . COLONOSCOPY  10/25/2011   Procedure: COLONOSCOPY;  Surgeon: Garlan Fair, MD;  Location: [facility] ENDOSCOPY;  Service: Endoscopy;  Laterality: N/A;  . EYE SURGERY Bilateral    cataracts  . INTRAOPERATIVE TRANSESOPHAGEAL ECHOCARDIOGRAM N/A 03/31/2013   Procedure: INTRAOPERATIVE TRANSESOPHAGEAL ECHOCARDIOGRAM;  Surgeon: Grace Isaac, MD;  Location: [place];  Service: Open Heart Surgery;  Laterality: N/A;  . KYPHOPLASTY N/A 05/07/2018   Procedure: KYPHOPLASTY LUMBAR THREE;  Surgeon: Melina Schools, MD;  Location: [place];  Service: Orthopedics;  Laterality: N/A;  . KYPHOPLASTY N/A 05/14/2019   Procedure: KYPHOPLASTY T11;  Surgeon: Melina Schools, MD;  Location: [place];  Service: Orthopedics;  Laterality: N/A;  90 mins  . KYPHOPLASTY N/A 07/08/2019   Procedure: KYPHOPLASTY THORACIC TWELVE;  Surgeon: Melina Schools, MD;  Location: [place];  Service: Orthopedics;  Laterality: N/A;  60 mins  . LEFT AND RIGHT HEART CATHETERIZATION WITH CORONARY ANGIOGRAM Right 02/02/2013   Procedure: LEFT AND RIGHT HEART CATHETERIZATION WITH CORONARY ANGIOGRAM;  Surgeon: Hillary Bow, MD;  Location: [facility] CATH LAB;  Service: Cardiovascular;  Laterality: Right;  Marland Kitchen MAZE N/A 03/31/2013   Procedure: MAZE;  Surgeon: Grace Isaac, MD;  Location: [place];  Service: Open Heart Surgery;  Laterality: N/A;  . Sarcoma resection  2011   Social History:  reports that she has never smoked. She has never used smokeless tobacco. She reports that she does not drink alcohol and does not use drugs.  Allergies  Allergen Reactions  . Alendronate Sodium Nausea And Vomiting   . Ambien [Zolpidem Tartrate] Other (See Comments)    hallucinations  . [place] Nausea Only    Drinks soy milk  . Lyrica [Pregabalin] Nausea And Vomiting  . Milk-Related Compounds Diarrhea    Pt can have foods that have milk in it, she just can't drink milk  . Neurontin [Gabapentin] Nausea And Vomiting  . Procaine Hcl Other (See Comments)    Reaction unknown  . Toprol Xl [Metoprolol Succinate] Other (See Comments)    Does not remember. Tolerates Lopressor 04/10/13, [name]    Family History  Problem Relation Age of Onset  . Heart disease Mother   . Stroke Mother   . Arthritis Father   . Cancer Father   . Anesthesia problems Brother   . Hypertension Sister      Prior to Admission medications   Medication Sig Start Date End Date Taking? Authorizing Provider  acetaminophen (TYLENOL) 500 MG tablet Take 2 tablets (1,000 mg total) by mouth every 6 (six) hours as needed for mild pain (pain.). 05/14/19  Yes Ward, Yvonne Kendall, PA-C  ALPRAZolam Duanne Moron) 0.5 MG  tablet Take 0.5 mg by mouth at bedtime. 07/05/20  Yes [provider]  amiodarone (PACERONE) 200 MG tablet Take 1 tablet (200 mg total) by mouth daily. 09/07/19  Yes Crenshaw, Denice Bors, MD  ELIQUIS 2.5 MG TABS tablet TAKE 1 TABLET TWICE A DAY Patient taking differently: Take 2.5 mg by mouth 2 (two) times daily.  03/29/20  Yes Lelon Perla, MD  furosemide (LASIX) 40 MG tablet Take 1 tablet daily and can take extra 1 tablet as needed for swelling Patient taking differently: Take 20 mg by mouth daily.  05/24/20  Yes Lelon Perla, MD  lactase (LACTAID) 3000 UNITS tablet Take 1 tablet by mouth daily as needed (only when eating dairy).   Yes [provider]  levothyroxine (SYNTHROID, LEVOTHROID) 75 MCG tablet Take 75 mcg by mouth at bedtime.    Yes [provider]  Multiple Vitamin (MULTIVITAMIN WITH MINERALS) TABS tablet Take 1 tablet by mouth daily.   Yes [provider]  OVER THE COUNTER MEDICATION  Take 1 tablet by mouth with breakfast, with lunch, and with evening meal. Golo supplement   Yes [provider]  OVER THE COUNTER MEDICATION Take 1 tablet by mouth daily. prevagin for memory OTC   Yes [provider]  potassium chloride SA (KLOR-CON) 20 MEQ tablet Take 20 mEq by mouth daily. 05/05/20  Yes [provider]  pravastatin (PRAVACHOL) 40 MG tablet TAKE 1 TABLET(40 MG) BY MOUTH EVERY EVENING Patient taking differently: Take 40 mg by mouth daily.  07/18/20  Yes Lelon Perla, MD  rOPINIRole (REQUIP) 0.25 MG tablet Take 0.25 mg by mouth at bedtime.    Yes [provider]  ALPRAZolam (XANAX) 0.25 MG tablet Take 2 tablets (0.5 mg total) by mouth daily as needed for anxiety. Patient not taking: Reported on 07/31/2020 09/28/19   Lelon Perla, MD  polyethylene glycol [facility] Floria Raveling) packet Take 17 g by mouth daily. Patient not taking: Reported on 07/31/2020 04/11/18   Kerney Elbe, DO   Physical Exam: Vitals:   07/31/20 0901 07/31/20 1128 07/31/20 1134 07/31/20 1345  BP:  139/86  108/74  Pulse:  89  76  Resp:  17  (!) 22  Temp:   (!) 101.2 F (38.4 C)   TempSrc:   Rectal   SpO2:  97%  95%  Weight: 59.9 kg     Height: 5\' 5"  (1.651 m)       Wt Readings from Last 3 Encounters:  07/31/20 59.9 kg  05/24/20 60.5 kg  10/27/19 57.2 kg     . General:  Appears calm and comfortable . Eyes: PERRL, normal lids, irises & conjunctiva . ENT: grossly normal hearing, lips & tongue . Neck: no masses . Cardiovascular: Irregularly irregular, intermittent S3, 2 out of 6 systolic murmur, JVD at 30 degrees just above the clavicle. No LE edema. . Telemetry: A. fib . Respiratory: CTA bilaterally, no w/r/r. Normal respiratory effort. . Abdomen: soft, bowel sounds present, mildly distended, mild tenderness to palpation in right upper quadrant and right lower quadrant . Skin: no rash or induration seen on limited exam . Musculoskeletal: grossly normal  tone BUE/BLE . Psychiatric: grossly normal mood and affect, speech fluent and appropriate . Neurologic: grossly non-focal.          Labs on Admission:  Basic Metabolic Panel: Recent Labs  Lab 07/31/20 0923 07/31/20 1111  NA 135 138  K 6.3* 3.8  CL 103 99  CO2 21* 25  GLUCOSE 82 87  BUN 10 11  CREATININE 0.76 0.64  CALCIUM 7.6* 9.0   Liver Function Tests: Recent Labs  Lab 07/31/20 0923 07/31/20 1111  AST 68* 38  ALT 18 22  ALKPHOS 82 105  BILITOT 2.0* 0.7  PROT 6.3* 7.7  ALBUMIN 3.2* 4.1   Recent Labs  Lab 07/31/20 0923  LIPASE 17   No results for input(s): AMMONIA in the last 168 hours. CBC: Recent Labs  Lab 07/31/20 0923  WBC 7.7  NEUTROABS 6.6  HGB 13.1  HCT 40.6  MCV 101.0*  PLT 136*   Cardiac Enzymes: No results for input(s): CKTOTAL, CKMB, CKMBINDEX, TROPONINI in the last 168 hours.  BNP (last 3 results) No results for input(s): BNP in the last 8760 hours.  ProBNP (last 3 results) No results for input(s): PROBNP in the last 8760 hours.  CBG: No results for input(s): GLUCAP in the last 168 hours.  Radiological Exams on Admission: CT Abdomen Pelvis W Contrast  Result Date: 07/31/2020 CLINICAL DATA:  86 year old who had multiple episodes of diarrhea yesterday which was followed by severe RIGHT LOWER QUADRANT abdominal pain which kept her from sleeping last night. Surgical history includes abdominal hysterectomy and cystic pexy. 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 EXAM: CT ABDOMEN AND PELVIS WITH CONTRAST TECHNIQUE: Multidetector CT imaging of the abdomen and pelvis was performed using the standard protocol following bolus administration of intravenous contrast. CONTRAST:  181mL OMNIPAQUE IOHEXOL 300 MG/ML IV. COMPARISON:  10/22/2019 and earlier. FINDINGS: Lower chest: Scarring in the lower lobes, RIGHT MIDDLE LOBE and lingula, unchanged. RIGHT LOWER LOBE bronchiectasis perhaps slightly increased since the  prior examinations. Visualized lung bases otherwise clear. Heart moderately enlarged but stable with RIGHT atrial enlargement in particular. Hepatobiliary: Mildly irregular hepatic contour and relative enlargement of the LEFT lobe as noted previously. Stable 1.6 cm benign hemangioma involving the LEFT lobe of the liver. Progressive intra and extrahepatic biliary ductal dilation since the December, 2020 CT, though I do not identify an obstructing stone or mass. The common bile duct measures up to approximately 13 mm diameter. Mild gallbladder distension. No CT evidence of cholelithiasis or pericholecystic inflammation. Pancreas: Mild to moderate atrophy. Mild of the pancreatic duct in the head and proximal body up to approximately 4 mm. No mass or peripancreatic inflammation. Spleen: Normal in size and appearance. Adrenals/Urinary Tract: Normal appearing adrenal glands. Benign approximate 8 mm cortical cyst arising from the LOWER pole of the RIGHT kidney, unchanged. No significant focal parenchymal abnormality involving either kidney. No hydronephrosis. No urinary tract calculi on either side. Normal appearing urinary bladder. Stomach/Bowel: Small hiatal hernia, unchanged. Otherwise normal appearing decompressed J-shaped stomach. Normal-appearing small bowel. Mobile cecum positioned in the RIGHT UPPER QUADRANT of the abdomen extending to the midline. Expected colonic stool burden. Extensive sigmoid colon diverticulosis without evidence of acute diverticulitis. Remainder of the colon tortuous and elongated without focal abnormality elsewhere. Appendix is inconspicuous, but no evidence of pericecal inflammation. Vascular/Lymphatic: Severe aortoiliofemoral atherosclerosis. No evidence of abdominal aortic aneurysm. Approximate 2.0 cm aneurysm involving the LEFT common iliac artery. Normal-appearing portal venous and systemic venous systems. No pathologic lymphadenopathy. Reproductive: Surgically absent uterus.  No  adnexal masses. Other: None. Musculoskeletal: Osseous demineralization. Augmentation of vertebral body fractures at T11, T12 and L3. Compression fractures of L1 and L4, unchanged. Severe spinal stenosis at the L3 level due to the retropulsion of the compression fracture at this level, unchanged. No acute findings. IMPRESSION: 1. Progressive  intra and extrahepatic biliary ductal dilation since the December, 2020 CT, without evidence of an obstructing stone or mass. ERCP or MRCP may be helpful in further evaluation to exclude an occult ampullary mass. 2. Stable CT findings of hepatic cirrhosis. Stable benign hemangioma involving the LEFT hepatic lobe. 3. Extensive sigmoid colon diverticulosis without evidence of acute diverticulitis. 4. Stable small hiatal hernia. 5. Stable mild pancreatic atrophy. 6. 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 Aortic Atherosclerosis (ICD10-I70.0). Electronically Signed   By: Evangeline Dakin M.D.   On: 07/31/2020 12:26    EKG: Independently reviewed.  A. fib, suspect limb lead reversal based on negative deflections in 1 and aVL compared to prior, within those limitations, anterolateral nonspecific ST changes in precordial leads.  Compared to prior slightly more pronounced but overall no significant change.  Assessment/Plan Active Problems:   Abdominal pain   #Sepsis #Abdominal pain #Likely Intra-abdominal source Meets criteria for SIRS with fever and tachypnea, sepsis with likely intra-abdominal infection though not severe sepsis is no signs of endorgan dysfunction.  UA points to likely urinary source though patient not particularly symptomatic.  Other possibilities include bacterial colitis such as Campylobacter which can mimic appendicitis symptoms though CT did not show significant signs of colitis.  Viral gastroenteritis is another possibility as patient reporting diarrhea though unusual to cause fevers.  Unlikely to be related to biliary tract  dilatation with normal white count and relatively well-appearing, low suspicion for cholangitis.  Low suspicion for bowel ischemia with normal lactic acid and patient compliant with anticoagulation. -Continue ceftriaxone 1 g every 24 hours for presumed urinary infection -Defer broadening of antibiotics at this time, remainder of likely possibilities either self-limited or do not require antibiotics -Follow-Up Urine and Blood Cultures -Follow-up GI PCR panel  #Biliary ductal dilatation #Hepatic cirrhosis Patient with progressive intra and extrahepatic biliary ductal dilatation since last CT in December 2020.  Chart review shows also underwent liver MRI in March 2020 to evaluate lesion that turned out to be hemangioma, no comment made at that time of biliary ductal dilatation.  Low suspicion that this is related to patient's presentation, however report that her grandmother died of biliary cancer is concerning.  Will consult GI to help further work-up. -Consult placed, secure chat sent to on-call [facility] provider to request patient be seen in a.m.  #Chronic medical problems Anxiety: Continue Xanax at bedtime A. fib: Continue amiodarone, Eliquis CHF: Continue furosemide, potassium Hypothyroidism: Continue Synthroid Hyperlipidemia: Continue pravastatin RLS: Continue ropinirole  Code Status: DNR DVT Prophylaxis: Lovenox Family Communication: None Disposition Plan: Inpatient   Time spent: 50 min  Clarnce Flock MD/[facility]

## 2020-07-31 NOTE — ED Provider Notes (Signed)
Patient seen and examined, agree with assessment and plan by APP. Patient here with abdominal pain, diarrhea, fever and signs of UTI. Awaiting labs and CT abd/pel. Likely admission.    Truddie Hidden, MD 07/31/20 [phone]

## 2020-08-01 ENCOUNTER — Inpatient Hospital Stay (HOSPITAL_COMMUNITY): Payer: Medicare Other

## 2020-08-01 DIAGNOSIS — N3000 Acute cystitis without hematuria: Secondary | ICD-10-CM

## 2020-08-01 DIAGNOSIS — I2510 Atherosclerotic heart disease of native coronary artery without angina pectoris: Secondary | ICD-10-CM

## 2020-08-01 DIAGNOSIS — R1010 Upper abdominal pain, unspecified: Secondary | ICD-10-CM

## 2020-08-01 LAB — COMPREHENSIVE METABOLIC PANEL
ALT: 58 U/L — ABNORMAL HIGH (ref 0–44)
AST: 93 U/L — ABNORMAL HIGH (ref 15–41)
Albumin: 3.2 g/dL — ABNORMAL LOW (ref 3.5–5.0)
Alkaline Phosphatase: 85 U/L (ref 38–126)
Anion gap: 11 (ref 5–15)
BUN: 13 mg/dL (ref 8–23)
CO2: 23 mmol/L (ref 22–32)
Calcium: 8.4 mg/dL — ABNORMAL LOW (ref 8.9–10.3)
Chloride: 102 mmol/L (ref 98–111)
Creatinine, Ser: 0.63 mg/dL (ref 0.44–1.00)
GFR calc Af Amer: >60 mL/min (ref >60)
GFR calc non Af Amer: >60 mL/min (ref >60)
Glucose, Bld: 99 mg/dL (ref 70–99)
Potassium: 3.6 mmol/L (ref 3.5–5.1)
Sodium: 136 mmol/L (ref 135–145)
Total Bilirubin: 0.5 mg/dL (ref 0.3–1.2)
Total Protein: 5.9 g/dL — ABNORMAL LOW (ref 6.5–8.1)

## 2020-08-01 LAB — CBC
HCT: 36.1 % (ref 36.0–46.0)
Hemoglobin: 12.1 g/dL (ref 12.0–15.0)
MCH: 34 pg (ref 26.0–34.0)
MCHC: 33.5 g/dL (ref 30.0–36.0)
MCV: 101.4 fL — ABNORMAL HIGH (ref 80.0–100.0)
Platelets: 140 10*3/uL — ABNORMAL LOW (ref 150–400)
RBC: 3.56 MIL/uL — ABNORMAL LOW (ref 3.87–5.11)
RDW: 13.8 % (ref 11.5–15.5)
WBC: 5.7 10*3/uL (ref 4.0–10.5)
nRBC: 0 % (ref 0.0–0.2)

## 2020-08-01 MED ORDER — GADOBUTROL 1 MMOL/ML IV SOLN
6.0000 mL | Freq: Once | INTRAVENOUS | Status: AC | PRN
  Administered 2020-08-01: 6 mL via INTRAVENOUS

## 2020-08-01 MED ORDER — SODIUM CHLORIDE 0.9% FLUSH
3.0000 mL | Freq: Two times a day (BID) | INTRAVENOUS | Status: DC
  Administered 2020-08-01 (×2): 3 mL via INTRAVENOUS

## 2020-08-01 MED ORDER — SODIUM CHLORIDE 0.9 % IV SOLN: 250.0000 mL | INTRAVENOUS | Status: DC | PRN

## 2020-08-01 MED ORDER — SODIUM CHLORIDE 0.9 % IV SOLN: INTRAVENOUS | Status: DC

## 2020-08-01 MED ORDER — SODIUM CHLORIDE 0.9% FLUSH: 3.0000 mL | INTRAVENOUS | Status: DC | PRN

## 2020-08-01 NOTE — Consult Note (Signed)
Subjective:   HPI  The patient is a 86 year old female who was admitted to the hospital yesterday with complaints of sudden onset of right lower quadrant abdominal pain.  She was thinking that she had appendicitis.  She came to the emergency department where she was found to have a temperature of 101.2.  She had a CT scan of the abdomen and pelvis and there was no mention of pancreatitis but she did have progressive dilatation of the intra and extrahepatic biliary tree with no obvious stones and no masses to account for this.  She had had a previous CT scan in December 2020.  Her liver enzymes are somewhat elevated with an AST of 93 and ALT 58.  She does have some slight nodularity of the liver which is felt to be probably cirrhosis.  She had some loose stools.    Past Medical History:  Diagnosis Date  . Anxiety    takes xanax for sleep.   . Aortic insufficiency    a. 03/2013 s/p  AVR w/ biologic 25mm Magna Ease peric tissue valve, model 300TFX, ser# [id number].   . Back pain    BACK BRACE/FRACTURE  . CAD (coronary artery disease)    a. 01/2013 mod calcification w/o sev dzs;  b. 03/2013 CABG x1 VG->RCA @ time of AVR  . Chronic diastolic CHF (congestive heart failure) ([place])    a. 01/2013 echo: EF 50-55%.  . Compression fracture of body of thoracic vertebra (HCC)    T-11  . Dementia ([place])   . Dilated aortic root ([place])    a. 03/2013 s/p Bentall procedure with Ao Root replacement (73mm Valsalva graft) w/ reimplantationof the R and L coronary ostium.  Marland Kitchen Dysrhythmia    A. FIB  . Fibromyalgia   . Headache(784.0)    hx migraines  . Hypothyroidism   . Intracranial aneurysm    in late 1970's  . 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 Neoadjuvant RT by Dr. Yisroel Ramming. Surveillance plan: MRI 12/08/11 shows changes in presumed post-op seroma/hematoma with slight increase in size - follow up MRI 11/18/12 showed  same size. Will continue with q3 month MRI. CT C/A/P annually alternating with CXR at 6 months.   . Osteoporosis   . Paroxysmal A-fib ([place])    a. Dx 01/2013 - placed on xarelto;  b. 03/2013 s/p left sided MAZE and LA clip @ time of AVR/CABG.  . Peripheral neuropathy   . PMR (polymyalgia rheumatica) (HCC)    Inactive  . Rheumatic heart disease   . 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 Vertigo    Past Surgical History:  Procedure Laterality Date  . ABDOMINAL HYSTERECTOMY    . ASCENDING AORTIC ROOT REPLACEMENT N/A 03/31/2013   Procedure: ASCENDING AORTIC ROOT REPLACEMENT;  Surgeon: Grace Isaac, MD;  Location: [place];  Service: Open Heart Surgery;  Laterality: N/A; clip inserted.  Marland Kitchen BLADDER SUSPENSION    . CARDIOVERSION N/A 01/29/2017   Procedure: CARDIOVERSION;  Surgeon: Jerline Pain, MD;  Location: [place];  Service: Cardiovascular;  Laterality: N/A;  . CARDIOVERSION N/A 10/22/2017   Procedure: CARDIOVERSION;  Surgeon: Lelon Perla, MD;  Location: [facility] ENDOSCOPY;  Service: Cardiovascular;  Laterality: N/A;  . COLONOSCOPY  10/25/2011   Procedure: COLONOSCOPY;  Surgeon: Garlan Fair, MD;  Location: [facility] ENDOSCOPY;  Service: Endoscopy;  Laterality: N/A;  .  EYE SURGERY Bilateral    cataracts  . INTRAOPERATIVE TRANSESOPHAGEAL ECHOCARDIOGRAM N/A 03/31/2013   Procedure: INTRAOPERATIVE TRANSESOPHAGEAL ECHOCARDIOGRAM;  Surgeon: Grace Isaac, MD;  Location: [place];  Service: Open Heart Surgery;  Laterality: N/A;  . KYPHOPLASTY N/A 05/07/2018   Procedure: KYPHOPLASTY LUMBAR THREE;  Surgeon: Melina Schools, MD;  Location: [place];  Service: Orthopedics;  Laterality: N/A;  . KYPHOPLASTY N/A 05/14/2019   Procedure: KYPHOPLASTY T11;  Surgeon: Melina Schools, MD;  Location: [place];  Service: Orthopedics;  Laterality: N/A;  90 mins  . KYPHOPLASTY N/A 07/08/2019   Procedure: KYPHOPLASTY THORACIC TWELVE;  Surgeon: Melina Schools, MD;  Location: [place];   Service: Orthopedics;  Laterality: N/A;  60 mins  . LEFT AND RIGHT HEART CATHETERIZATION WITH CORONARY ANGIOGRAM Right 02/02/2013   Procedure: LEFT AND RIGHT HEART CATHETERIZATION WITH CORONARY ANGIOGRAM;  Surgeon: Hillary Bow, MD;  Location: [facility] CATH LAB;  Service: Cardiovascular;  Laterality: Right;  Marland Kitchen MAZE N/A 03/31/2013   Procedure: MAZE;  Surgeon: Grace Isaac, MD;  Location: [place];  Service: Open Heart Surgery;  Laterality: N/A;  . Sarcoma resection  2011   Social History   Socioeconomic History  . Marital status: Married    Spouse name: Not on file  . Number of children: Not on file  . Years of education: Not on file  . Highest education level: Not on file  Occupational History  . Not on file  Tobacco Use  . Smoking status: Never Smoker  . Smokeless tobacco: Never Used  Vaping Use  . Vaping Use: Never used  Substance and Sexual Activity  . Alcohol use: No  . Drug use: No  . Sexual activity: Yes    Birth control/protection: Post-menopausal  Other Topics Concern  . Not on file  Social History Narrative  . Not on file   Social Determinants of Health   Financial Resource Strain:   . Difficulty of Paying Living Expenses: Not on file  Food Insecurity:   . Worried About Charity fundraiser in the Last Year: Not on file  . Ran Out of Food in the Last Year: Not on file  Transportation Needs:   . Lack of Transportation (Medical): Not on file  . Lack of Transportation (Non-Medical): Not on file  Physical Activity:   . Days of Exercise per Week: Not on file  . Minutes of Exercise per Session: Not on file  Stress:   . Feeling of Stress : Not on file  Social Connections:   . Frequency of Communication with Friends and Family: Not on file  . Frequency of Social Gatherings with Friends and Family: Not on file  . Attends Religious Services: Not on file  . Active Member of Clubs or Organizations: Not on file  . Attends Archivist Meetings: Not on file  .  Marital Status: Not on file  Intimate Partner Violence:   . Fear of Current or Ex-Partner: Not on file  . Emotionally Abused: Not on file  . Physically Abused: Not on file  . Sexually Abused: Not on file   family history includes Anesthesia problems in her brother; Arthritis in her father; Cancer in her father; Heart disease in her mother; Hypertension in her sister; Stroke in her mother.  Current Facility-Administered Medications:  .  0.9 %  sodium chloride infusion, 250 mL, Intravenous, PRN, Rai, Ripudeep K, MD .  acetaminophen (TYLENOL) tablet 650 mg, 650 mg, Oral, Q6H PRN **OR** acetaminophen (TYLENOL) suppository 650 mg,  650 mg, Rectal, Q6H PRN, Clarnce Flock, MD .  ALPRAZolam Duanne Moron) tablet 0.5 mg, 0.5 mg, Oral, QHS, Clarnce Flock, MD .  amiodarone (PACERONE) tablet 200 mg, 200 mg, Oral, Daily, Clarnce Flock, MD, 200 mg at 08/01/20 1012 .  apixaban (ELIQUIS) tablet 2.5 mg, 2.5 mg, Oral, BID, Clarnce Flock, MD, 2.5 mg at 08/01/20 1012 .  cefTRIAXone (ROCEPHIN) 1 g in sodium chloride 0.9 % 100 mL IVPB, 1 g, Intravenous, Q24H, Clarnce Flock, MD .  furosemide (LASIX) tablet 20 mg, 20 mg, Oral, Daily, Clarnce Flock, MD, 20 mg at 08/01/20 1012 .  lactase (LACTAID) tablet 3,000 Units, 1 tablet, Oral, Daily PRN, Clarnce Flock, MD .  levothyroxine (SYNTHROID) tablet 75 mcg, 75 mcg, Oral, QHS, Clarnce Flock, MD, 75 mcg at 07/31/20 2105 .  methocarbamol (ROBAXIN) 500 mg in dextrose 5 % 50 mL IVPB, 500 mg, Intravenous, Q6H PRN, Clarnce Flock, MD .  multivitamin with minerals tablet 1 tablet, 1 tablet, Oral, Daily, Clarnce Flock, MD, 1 tablet at 08/01/20 1012 .  ondansetron (ZOFRAN) tablet 4 mg, 4 mg, Oral, Q6H PRN **OR** ondansetron (ZOFRAN) injection 4 mg, 4 mg, Intravenous, Q6H PRN, Clarnce Flock, MD .  oxyCODONE (Oxy IR/ROXICODONE) immediate release tablet 5 mg, 5 mg, Oral, Q4H PRN, Clarnce Flock, MD, 5 mg at 07/31/20 2103 .   polyethylene glycol (MIRALAX / GLYCOLAX) packet 17 g, 17 g, Oral, Daily PRN, Clarnce Flock, MD .  potassium chloride SA (KLOR-CON) CR tablet 20 mEq, 20 mEq, Oral, Daily, Clarnce Flock, MD, 20 mEq at 08/01/20 1012 .  rOPINIRole (REQUIP) tablet 0.25 mg, 0.25 mg, Oral, QHS, Clarnce Flock, MD, 0.25 mg at 07/31/20 2104 .  sodium chloride flush (NS) 0.9 % injection 3 mL, 3 mL, Intravenous, Q12H, Rai, Ripudeep K, MD, 3 mL at 08/01/20 1132 .  sodium chloride flush (NS) 0.9 % injection 3 mL, 3 mL, Intravenous, PRN, Rai, Ripudeep K, MD .  traZODone (DESYREL) tablet 50 mg, 50 mg, Oral, QHS PRN, Clarnce Flock, MD Allergies  Allergen Reactions  . Alendronate Sodium Nausea And Vomiting  . Ambien [Zolpidem Tartrate] Other (See Comments)    hallucinations  . [place]ov[place] Nausea Only    Drinks soy milk  . Lyrica [Pregabalin] Nausea And Vomiting  . Milk-Related Compounds Diarrhea    Pt can have foods that have milk in it, she just can't drink milk  . Neurontin [Gabapentin] Nausea And Vomiting  . Procaine Hcl Other (See Comments)    Reaction unknown  . Toprol Xl [Metoprolol Succinate] Other (See Comments)    Does not remember. Tolerates Lopressor 04/10/13, [name]     Objective:     BP 133/89 (BP Location: Right Arm)   Pulse 77   Temp 98 F (36.7 C)   Resp 20   Ht 5\' 5"  (1.651 m)   Wt 59.9 kg   LMP  (LMP Unknown)   SpO2 98%   BMI 21.97 kg/m   No distress  Heart regular rhythm  Lungs clear  Abdomen soft and nontender, she states the pain is gone  Laboratory No components found for: D1    Assessment:     Right lower quadrant nominal pain etiology unclear.  Progressive dilatation of the biliary tree etiology unclear  Nodularity of liver  Other medical problems as mentioned above  She is being managed for sepsis at this time      Plan:  From a GI standpoint and the progressive dilatation of the biliary tree order a MRCP.  Continue other treatment as is  currently being done.  Further decisions after reviewing the MRCP.

## 2020-08-01 NOTE — Plan of Care (Signed)

## 2020-08-01 NOTE — Progress Notes (Signed)
[facility]                                                                              Patient Demographics  Kayla Chambers, is a 86 y.o. female, DOB - 1927/09/10, HYW:[id number]  Admit date - 07/31/2020   Admitting Physician Clarnce Flock, MD  Outpatient Primary MD for the patient is Donald Prose, MD  Outpatient specialists:   LOS - 1  days   Medical records reviewed and are as summarized below:    Chief Complaint  Patient presents with  . Abdominal Pain       Brief summary   Patient is a 86 year old female with history of hypertension, CAD, chronic diastolic CHF, AVR, paroxysmal atrial fibrillation, osteoporosis, PMR presented with abdominal pain.  Patient reported that she woke up on the morning of admission on 9/12 with severe onset of right lower quadrant abdominal pain, thought she was having appendicitis.  She also reported significant amount of diarrhea a day before the admission, nonbloody but no nausea or vomiting.  Also reported increased urinary frequency but no dysuria.  Patient had a fever of 101.2 F, tachypnea RR 22 at the time of admission. Negative Covid test. UA positive for UTI CT abdomen pelvis showed intra and extrahepatic biliary ductal dilatation progressive since CT from 10/2019  patient was placed on IV Rocephin and pain medication, admitted for further work-up   Assessment & Plan    Sepsis likely from intra-abdominal source, GI versus UTI possible gastroenteritis -Patient met sepsis criteria at the time of admission with fevers, tachypnea, UA positive for UTI, + ketones, normal lactic acid -Follow blood cultures, urine culture, continue IV Rocephin -Currently no abdominal pain, however LFTs are trending up    Hepatic cirrhosis with biliary duct dilatation - Patient with progressive intra and extrahepatic biliary ductal dilatation since last CT in December 2020.  Chart review shows also underwent liver MRI in March 2020 to  evaluate lesion that turned out to be hemangioma, no comment made at that time of biliary ductal dilatation - Follow GI recommendation, currently no fevers, worsening abdominal pain although LFTs are trending up, low suspicion for acute cholangitis  Anxiety -Continue Xanax at bedtime  History of chronic atrial fibrillation -Rate controlled, continue amiodarone, Eliquis  History of CHF, diastolic -Prior echo in 48/5462 had shown normal EF with no regional wall motion abnormalities -Currently euvolemic, continue Lasix  Hypothyroidism -Continue Synthroid  Hyperlipidemia -Hold statin secondary to elevated LFTs  History of restless leg syndrome -Continue Requip    Code Status: DNR DVT Prophylaxis: Apixaban Family Communication: Discussed all imaging results, lab results, explained to the patient and daughter, Hoyle Sauer    Disposition Plan:     Status is: Inpatient  Remains inpatient appropriate because:Inpatient level of care appropriate due to severity of illness   Dispo: The patient is from: Home              Anticipated d/c is to: Home              Anticipated d/c date is: 1 day  Patient currently is not medically stable to d/c.  Awaiting GI evaluation, LFTs trending up    Time Spent in minutes   35 mins   Procedures:  CT abd  Consultants:   GI - Dr Penelope Coop   Antimicrobials:   Anti-infectives (From admission, onward)   Start     Dose/Rate Route Frequency Ordered Stop   08/01/20 1200  cefTRIAXone (ROCEPHIN) 1 g in sodium chloride 0.9 % 100 mL IVPB        1 g 200 mL/hr over 30 Minutes Intravenous Every 24 hours 07/31/20 1640 08/05/20 1159   07/31/20 1245  cefTRIAXone (ROCEPHIN) 1 g in sodium chloride 0.9 % 100 mL IVPB        1 g 200 mL/hr over 30 Minutes Intravenous  Once 07/31/20 1233 07/31/20 1344          Medications  Scheduled Meds: . ALPRAZolam  0.5 mg Oral QHS  . amiodarone  200 mg Oral Daily  . apixaban  2.5 mg Oral BID  . furosemide   20 mg Oral Daily  . levothyroxine  75 mcg Oral QHS  . multivitamin with minerals  1 tablet Oral Daily  . potassium chloride SA  20 mEq Oral Daily  . pravastatin  40 mg Oral Daily  . rOPINIRole  0.25 mg Oral QHS   Continuous Infusions: . sodium chloride 75 mL/hr at 08/01/20 1017  . cefTRIAXone (ROCEPHIN)  IV    . methocarbamol (ROBAXIN) IV     PRN Meds:.acetaminophen **OR** acetaminophen, lactase, methocarbamol (ROBAXIN) IV, ondansetron **OR** ondansetron (ZOFRAN) IV, oxyCODONE, polyethylene glycol, traZODone      Subjective:   Kayla Chambers was seen and examined today.  Afebrile this morning, abdominal pain improved, no nausea or vomiting.  Hungry, waiting for her breakfast at the time of my examination. Patient denies dizziness, chest pain, shortness of breath.  No acute events overnight.    Objective:   Vitals:   07/31/20 1643 07/31/20 2111 08/01/20 0121 08/01/20 0454  BP:  118/80 117/74 118/67  Pulse:  80 71 72  Resp:  15 17 17   Temp: 98.1 F (36.7 C) 98.3 F (36.8 C) 99.1 F (37.3 C) 98.6 F (37 C)  TempSrc: Oral     SpO2:  97% 98% 97%  Weight:      Height:        Intake/Output Summary (Last 24 hours) at 08/01/2020 1112 Last data filed at 08/01/2020 1004 Gross per 24 hour  Intake 920 ml  Output 600 ml  Net 320 ml     Wt Readings from Last 3 Encounters:  07/31/20 59.9 kg  05/24/20 60.5 kg  10/27/19 57.2 kg     Exam  General: Alert and oriented x 3, NAD  Cardiovascular: S1 S2 auscultated, no murmurs, RRR  Respiratory: Clear to auscultation bilaterally, no wheezing, rales or rhonchi  Gastrointestinal: Soft, nontender, nondistended, + bowel sounds  Ext: no pedal edema bilaterally  Neuro: no new deficits  Musculoskeletal: No digital cyanosis, clubbing  Skin: No rashes  Psych: Normal affect and demeanor, alert and oriented x3    Data Reviewed:  I have personally reviewed following labs and imaging studies  Micro Results Recent Results (from  the past 240 hour(s))  SARS Coronavirus 2 by RT PCR (hospital order, performed in [place] hospital lab) Nasopharyngeal Nasopharyngeal Swab     Status: None   Collection Time: 07/31/20  1:08 PM   Specimen: Nasopharyngeal Swab  Result Value Ref Range Status   SARS Coronavirus  2 NEGATIVE NEGATIVE Final    Comment: (NOTE) SARS-CoV-2 target nucleic acids are NOT DETECTED.  The SARS-CoV-2 RNA is generally detectable in upper and lower respiratory specimens during the acute phase of infection. The lowest concentration of SARS-CoV-2 viral copies this assay can detect is 250 copies / mL. A negative result does not preclude SARS-CoV-2 infection and should not be used as the sole basis for treatment or other patient management decisions.  A negative result may occur with improper specimen collection / handling, submission of specimen other than nasopharyngeal swab, presence of viral mutation(s) within the areas targeted by this assay, and inadequate number of viral copies (<250 copies / mL). A negative result must be combined with clinical observations, patient history, and epidemiological information.  Fact Sheet for Patients:   StrictlyIdeas.no  Fact Sheet for Healthcare Providers: BankingDealers.co.za  This test is not yet approved or  cleared by the [place] FDA and has been authorized for detection and/or diagnosis of SARS-CoV-2 by FDA under an Emergency Use Authorization (EUA).  This EUA will remain in effect (meaning this test can be used) for the duration of the COVID-19 declaration under Section 564(b)(1) of the Act, 21 U.S.C. section 360bbb-3(b)(1), unless the authorization is terminated or revoked sooner.  Performed at [facility], [place] [address] [place], [place] [address]     Radiology Reports CT Abdomen Pelvis W Contrast  Result Date: 07/31/2020 CLINICAL DATA:  86 year old who had multiple  episodes of diarrhea yesterday which was followed by severe RIGHT LOWER QUADRANT abdominal pain which kept her from sleeping last night. Surgical history includes abdominal hysterectomy and cystic pexy. 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 EXAM: CT ABDOMEN AND PELVIS WITH CONTRAST TECHNIQUE: Multidetector CT imaging of the abdomen and pelvis was performed using the standard protocol following bolus administration of intravenous contrast. CONTRAST:  151m OMNIPAQUE IOHEXOL 300 MG/ML IV. COMPARISON:  10/22/2019 and earlier. FINDINGS: Lower chest: Scarring in the lower lobes, RIGHT MIDDLE LOBE and lingula, unchanged. RIGHT LOWER LOBE bronchiectasis perhaps slightly increased since the prior examinations. Visualized lung bases otherwise clear. Heart moderately enlarged but stable with RIGHT atrial enlargement in particular. Hepatobiliary: Mildly irregular hepatic contour and relative enlargement of the LEFT lobe as noted previously. Stable 1.6 cm benign hemangioma involving the LEFT lobe of the liver. Progressive intra and extrahepatic biliary ductal dilation since the December, 2020 CT, though I do not identify an obstructing stone or mass. The common bile duct measures up to approximately 13 mm diameter. Mild gallbladder distension. No CT evidence of cholelithiasis or pericholecystic inflammation. Pancreas: Mild to moderate atrophy. Mild of the pancreatic duct in the head and proximal body up to approximately 4 mm. No mass or peripancreatic inflammation. Spleen: Normal in size and appearance. Adrenals/Urinary Tract: Normal appearing adrenal glands. Benign approximate 8 mm cortical cyst arising from the LOWER pole of the RIGHT kidney, unchanged. No significant focal parenchymal abnormality involving either kidney. No hydronephrosis. No urinary tract calculi on either side. Normal appearing urinary bladder. Stomach/Bowel: Small hiatal hernia, unchanged. Otherwise normal  appearing decompressed J-shaped stomach. Normal-appearing small bowel. Mobile cecum positioned in the RIGHT UPPER QUADRANT of the abdomen extending to the midline. Expected colonic stool burden. Extensive sigmoid colon diverticulosis without evidence of acute diverticulitis. Remainder of the colon tortuous and elongated without focal abnormality elsewhere. Appendix is inconspicuous, but no evidence of pericecal inflammation. Vascular/Lymphatic: Severe aortoiliofemoral atherosclerosis. No evidence of abdominal aortic aneurysm. Approximate 2.0 cm aneurysm  involving the LEFT common iliac artery. Normal-appearing portal venous and systemic venous systems. No pathologic lymphadenopathy. Reproductive: Surgically absent uterus.  No adnexal masses. Other: None. Musculoskeletal: Osseous demineralization. Augmentation of vertebral body fractures at T11, T12 and L3. Compression fractures of L1 and L4, unchanged. Severe spinal stenosis at the L3 level due to the retropulsion of the compression fracture at this level, unchanged. No acute findings. IMPRESSION: 1. Progressive intra and extrahepatic biliary ductal dilation since the December, 2020 CT, without evidence of an obstructing stone or mass. ERCP or MRCP may be helpful in further evaluation to exclude an occult ampullary mass. 2. Stable CT findings of hepatic cirrhosis. Stable benign hemangioma involving the LEFT hepatic lobe. 3. Extensive sigmoid colon diverticulosis without evidence of acute diverticulitis. 4. Stable small hiatal hernia. 5. Stable mild pancreatic atrophy. 6. 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 Aortic Atherosclerosis (ICD10-I70.0). Electronically Signed   By: Evangeline Dakin M.D.   On: 07/31/2020 12:26    Lab Data:  CBC: Recent Labs  Lab 07/31/20 0923 08/01/20 0345  WBC 7.7 5.7  NEUTROABS 6.6  --   HGB 13.1 12.1  HCT 40.6 36.1  MCV 101.0* 101.4*  PLT 136* 366*   Basic Metabolic Panel: Recent Labs  Lab  07/31/20 0923 07/31/20 1111 08/01/20 0345  NA 135 138 136  K 6.3* 3.8 3.6  CL 103 99 102  CO2 21* 25 23  GLUCOSE 82 87 99  BUN 10 11 13   CREATININE 0.76 0.64 0.63  CALCIUM 7.6* 9.0 8.4*   GFR: Estimated Creatinine Clearance: 42.1 mL/min (by C-G formula based on SCr of 0.63 mg/dL). Liver Function Tests: Recent Labs  Lab 07/31/20 0923 07/31/20 1111 08/01/20 0345  AST 68* 38 93*  ALT 18 22 58*  ALKPHOS 82 105 85  BILITOT 2.0* 0.7 0.5  PROT 6.3* 7.7 5.9*  ALBUMIN 3.2* 4.1 3.2*   Recent Labs  Lab 07/31/20 0923  LIPASE 17   No results for input(s): AMMONIA in the last 168 hours. Coagulation Profile: No results for input(s): INR, PROTIME in the last 168 hours. Cardiac Enzymes: No results for input(s): CKTOTAL, CKMB, CKMBINDEX, TROPONINI in the last 168 hours. BNP (last 3 results) No results for input(s): PROBNP in the last 8760 hours. HbA1C: No results for input(s): HGBA1C in the last 72 hours. CBG: No results for input(s): GLUCAP in the last 168 hours. Lipid Profile: No results for input(s): CHOL, HDL, LDLCALC, TRIG, CHOLHDL, LDLDIRECT in the last 72 hours. Thyroid Function Tests: No results for input(s): TSH, T4TOTAL, FREET4, T3FREE, THYROIDAB in the last 72 hours. Anemia Panel: No results for input(s): VITAMINB12, FOLATE, FERRITIN, TIBC, IRON, RETICCTPCT in the last 72 hours. Urine analysis:    Component Value Date/Time   COLORURINE AMBER (A) 07/31/2020 0905   APPEARANCEUR CLOUDY (A) 07/31/2020 0905   LABSPEC 1.017 07/31/2020 0905   PHURINE 5.0 07/31/2020 0905   GLUCOSEU NEGATIVE 07/31/2020 0905   HGBUR MODERATE (A) 07/31/2020 0905   BILIRUBINUR NEGATIVE 07/31/2020 0905   KETONESUR 20 (A) 07/31/2020 0905   PROTEINUR 30 (A) 07/31/2020 0905   UROBILINOGEN 0.2 03/26/2013 1330   NITRITE POSITIVE (A) 07/31/2020 0905   LEUKOCYTESUR LARGE (A) 07/31/2020 0905     Blayke Cordrey M.D. [facility] 08/01/2020, 11:12 AM   Call night coverage person covering  after 7pm

## 2020-08-02 DIAGNOSIS — I5033 Acute on chronic diastolic (congestive) heart failure: Secondary | ICD-10-CM

## 2020-08-02 DIAGNOSIS — R933 Abnormal findings on diagnostic imaging of other parts of digestive tract: Secondary | ICD-10-CM

## 2020-08-02 LAB — COMPREHENSIVE METABOLIC PANEL
ALT: 54 U/L — ABNORMAL HIGH (ref 0–44)
AST: 63 U/L — ABNORMAL HIGH (ref 15–41)
Albumin: 3.1 g/dL — ABNORMAL LOW (ref 3.5–5.0)
Alkaline Phosphatase: 78 U/L (ref 38–126)
Anion gap: 12 (ref 5–15)
BUN: 16 mg/dL (ref 8–23)
CO2: 24 mmol/L (ref 22–32)
Calcium: 8.7 mg/dL — ABNORMAL LOW (ref 8.9–10.3)
Chloride: 102 mmol/L (ref 98–111)
Creatinine, Ser: 0.62 mg/dL (ref 0.44–1.00)
GFR calc Af Amer: >60 mL/min (ref >60)
GFR calc non Af Amer: >60 mL/min (ref >60)
Glucose, Bld: 96 mg/dL (ref 70–99)
Potassium: 4 mmol/L (ref 3.5–5.1)
Sodium: 138 mmol/L (ref 135–145)
Total Bilirubin: 0.3 mg/dL (ref 0.3–1.2)
Total Protein: 6 g/dL — ABNORMAL LOW (ref 6.5–8.1)

## 2020-08-02 LAB — URINE CULTURE: Culture: >=100000 — AB

## 2020-08-02 MED ORDER — PRAVASTATIN SODIUM 40 MG PO TABS: 40.0000 mg | ORAL_TABLET | Freq: Every day | ORAL | 3 refills | Status: DC

## 2020-08-02 MED ORDER — CEPHALEXIN 500 MG PO CAPS
500.0000 mg | ORAL_CAPSULE | Freq: Two times a day (BID) | ORAL | Status: DC
  Administered 2020-08-02: 500 mg via ORAL
  Filled 2020-08-02: qty 1

## 2020-08-02 MED ORDER — CEPHALEXIN 500 MG PO CAPS: 500.0000 mg | ORAL_CAPSULE | Freq: Two times a day (BID) | ORAL | 0 refills | Status: AC

## 2020-08-02 MED ORDER — ACETAMINOPHEN 500 MG PO TABS: 500.0000 mg | ORAL_TABLET | Freq: Four times a day (QID) | ORAL | 0 refills | Status: AC | PRN

## 2020-08-02 NOTE — Plan of Care (Signed)
Patient is discharged home, instructions were given to patient and her husband. All questions were answered and patient was taken to main entrance via wheelchair.

## 2020-08-02 NOTE — Discharge Summary (Signed)
Physician Discharge Summary   Patient ID: Kayla Chambers MRN: [id number] DOB/AGE: 05/11/1927 86 y.o.  Admit date: 07/31/2020 Discharge date: 08/02/2020  Primary Care Physician:  Kayla Prose, MD   Recommendations for Outpatient Follow-up:  1. Follow up with PCP in 1-2 weeks 2. Please check liver function tests on appointment  Home Health: Per patient and husband, she is back to baseline and declined PT evaluation Equipment/Devices:   Discharge Condition: stable CODE STATUS: DNR Diet recommendation: Heart healthy diet  Discharge Diagnoses:    . Abdominal pain possibly due to UTI  Sepsis likely from E. coli UTI  Mild transaminitis . 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 Hypothyroidism . Polymyalgia rheumatica ([place]) .  chronic diastolic heart failure ([place]) . PAF (paroxysmal atrial fibrillation) ([place]) . HTN (hypertension) . CAD (coronary artery disease) . PMR (polymyalgia rheumatica) ([facility]) . Vertebral fracture, osteoporotic, initial encounter ([place]) . Aortic insufficiency   Consults: Gastroenterology, Dr. Penelope Coop     Allergies:   Allergies  Allergen Reactions  . Alendronate Sodium Nausea And Vomiting  . Ambien [Zolpidem Tartrate] Other (See Comments)    hallucinations  . Lac Bovis Nausea Only    Drinks soy milk  . Lyrica [Pregabalin] Nausea And Vomiting  . Milk-Related Compounds Diarrhea    Pt can have foods that have milk in it, she just can't drink milk  . Neurontin [Gabapentin] Nausea And Vomiting  . Procaine Hcl Other (See Comments)    Reaction unknown  . Toprol Xl [Metoprolol Succinate] Other (See Comments)    Does not remember. Tolerates Lopressor 04/10/13, [name]     DISCHARGE MEDICATIONS: Allergies as of 08/02/2020      Reactions   Alendronate Sodium Nausea And Vomiting   Ambien [zolpidem Tartrate] Other (See Comments)   hallucinations   Lac Bovis Nausea Only   Drinks soy milk   Lyrica [pregabalin] Nausea And Vomiting   Milk-related Compounds Diarrhea   Pt can have  foods that have milk in it, she just can't drink milk   Neurontin [gabapentin] Nausea And Vomiting   Procaine Hcl Other (See Comments)   Reaction unknown   Toprol Xl [metoprolol Succinate] Other (See Comments)   Does not remember. Tolerates Lopressor 04/10/13, [name]      Medication List    TAKE these medications   acetaminophen 500 MG tablet Commonly known as: TYLENOL Take 1 tablet (500 mg total) by mouth every 6 (six) hours as needed for mild pain (pain.). What changed: how much to take   ALPRAZolam 0.5 MG tablet Commonly known as: XANAX Take 0.5 mg by mouth at bedtime.   amiodarone 200 MG tablet Commonly known as: PACERONE Take 1 tablet (200 mg total) by mouth daily.   cephALEXin 500 MG capsule Commonly known as: KEFLEX Take 1 capsule (500 mg total) by mouth 2 (two) times daily for 3 days.   Eliquis 2.5 MG Tabs tablet Generic drug: apixaban TAKE 1 TABLET TWICE A DAY What changed: how much to take   furosemide 40 MG tablet Commonly known as: LASIX Take 1 tablet daily and can take extra 1 tablet as needed for swelling What changed:   how much to take  how to take this  when to take this  additional instructions   lactase 3000 units tablet Commonly known as: LACTAID Take 1 tablet by mouth daily as needed (only when eating dairy).   levothyroxine 75 MCG tablet Commonly known as: SYNTHROID Take 75 mcg by mouth at bedtime.   multivitamin with minerals Tabs tablet Take 1  tablet by mouth daily.   OVER THE COUNTER MEDICATION Take 1 tablet by mouth with breakfast, with lunch, and with evening meal. Golo supplement   OVER THE COUNTER MEDICATION Take 1 tablet by mouth daily. prevagin for memory OTC   potassium chloride SA 20 MEQ tablet Commonly known as: KLOR-CON Take 20 mEq by mouth daily.   pravastatin 40 MG tablet Commonly known as: PRAVACHOL Take 1 tablet (40 mg total) by mouth daily. HOLD until liver function tests are normal What changed: See the new  instructions.   rOPINIRole 0.25 MG tablet Commonly known as: REQUIP Take 0.25 mg by mouth at bedtime.        Brief H and P: For complete details please refer to admission H and P, but in brief Patient is a 86 year old female with history of hypertension, CAD, chronic diastolic CHF, AVR, paroxysmal atrial fibrillation, osteoporosis, PMR presented with abdominal pain.  Patient reported that she woke up on the morning of admission on 9/12 with severe onset of right lower quadrant abdominal pain, thought she was having appendicitis.  She also reported significant amount of diarrhea a day before the admission, nonbloody but no nausea or vomiting.  Also reported increased urinary frequency but no dysuria.  Patient had a fever of 101.2 F, tachypnea RR 22 at the time of admission. Negative Covid test. UA positive for UTI CT abdomen pelvis showed intra and extrahepatic biliary ductal dilatation progressive since CT from 10/2019. Patient was placed on IV Rocephin and pain medication, admitted for further work-up  Hospital Course:    Sepsis likely from E. coli UTI -Patient met sepsis criteria at the time of admission with fevers, tachypnea, UA positive for UTI, + ketones, normal lactic acid -Blood cultures negative to date, urine culture showed more than 100,000 colonies of E. coli -Currently no abdominal pain, patient appears back to her baseline.  Will DC home on oral Keflex for 3 days.     abdominal pain, transaminitis likely due to sepsis - Patient with progressive intra and extrahepatic biliary ductal dilatation since last CT in December 2020. Chart review shows also underwent liver MRI in March 2020 to evaluate lesion that turned out to be hemangioma, no comment made at that time of biliary ductal dilatation -GI was consulted due to acute transaminitis, MRCP negative for any biliary ductal dilatation, or gallstones/choledocholithiasis  -Patient tolerating diet, LFTs improving, likely due  to #1, outpatient follow-up with PCP and recheck labs  Anxiety -Continue Xanax at bedtime  History of chronic atrial fibrillation -Rate controlled, continue amiodarone, Eliquis  History of CHF, diastolic -Prior echo in 63/8466 had shown normal EF with no regional wall motion abnormalities -Currently euvolemic, continue Lasix  Hypothyroidism -Continue Synthroid  Hyperlipidemia -Hold statin secondary to elevated LFTs  History of restless leg syndrome -Continue Requip   Day of Discharge S: Alert and oriented, husband at the bedside, feels back to her baseline, hoping to be discharged today.  Declined PT evaluation  BP (!) 154/98   Pulse 87   Temp 98.3 F (36.8 C) (Oral)   Resp 18   Ht 5' 5"  (1.651 m)   Wt 59.9 kg   LMP  (LMP Unknown)   SpO2 98%   BMI 21.97 kg/m   Physical Exam: General: Alert and awake oriented x3 not in any acute distress. HEENT: anicteric sclera, pupils reactive to light and accommodation CVS: S1-S2 clear no murmur rubs or gallops Chest: clear to auscultation bilaterally, no wheezing rales or rhonchi Abdomen: soft nontender,  nondistended, normal bowel sounds Extremities: no cyanosis, clubbing or edema noted bilaterally Neuro: Cranial nerves II-XII intact, no focal neurological deficits    Get Medicines reviewed and adjusted: Please take all your medications with you for your next visit with your Primary MD  Please request your Primary MD to go over all hospital tests and procedure/radiological results at the follow up. Please ask your Primary MD to get all Hospital records sent to his/her office.  If you experience worsening of your admission symptoms, develop shortness of breath, life threatening emergency, suicidal or homicidal thoughts you must seek medical attention immediately by calling 911 or calling your MD immediately  if symptoms less severe.  You must read complete instructions/literature along with all the possible adverse  reactions/side effects for all the Medicines you take and that have been prescribed to you. Take any new Medicines after you have completely understood and accept all the possible adverse reactions/side effects.   Do not drive when taking pain medications.   Do not take more than prescribed Pain, Sleep and Anxiety Medications  Special Instructions: If you have smoked or chewed Tobacco  in the last 2 yrs please stop smoking, stop any regular Alcohol  and or any Recreational drug use.  Wear Seat belts while driving.  Please note  You were cared for by a hospitalist during your hospital stay. Once you are discharged, your primary care physician will handle any further medical issues. Please note that NO REFILLS for any discharge medications will be authorized once you are discharged, as it is imperative that you return to your primary care physician (or establish a relationship with a primary care physician if you do not have one) for your aftercare needs so that they can reassess your need for medications and monitor your lab values.   The results of significant diagnostics from this hospitalization (including imaging, microbiology, ancillary and laboratory) are listed below for reference.      Procedures/Studies:  CT Abdomen Pelvis W Contrast  Result Date: 07/31/2020 CLINICAL DATA:  86 year old who had multiple episodes of diarrhea yesterday which was followed by severe RIGHT LOWER QUADRANT abdominal pain which kept her from sleeping last night. Surgical history includes abdominal hysterectomy and cystic pexy. 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 EXAM: CT ABDOMEN AND PELVIS WITH CONTRAST TECHNIQUE: Multidetector CT imaging of the abdomen and pelvis was performed using the standard protocol following bolus administration of intravenous contrast. CONTRAST:  125m OMNIPAQUE IOHEXOL 300 MG/ML IV. COMPARISON:  10/22/2019 and earlier. FINDINGS: Lower chest:  Scarring in the lower lobes, RIGHT MIDDLE LOBE and lingula, unchanged. RIGHT LOWER LOBE bronchiectasis perhaps slightly increased since the prior examinations. Visualized lung bases otherwise clear. Heart moderately enlarged but stable with RIGHT atrial enlargement in particular. Hepatobiliary: Mildly irregular hepatic contour and relative enlargement of the LEFT lobe as noted previously. Stable 1.6 cm benign hemangioma involving the LEFT lobe of the liver. Progressive intra and extrahepatic biliary ductal dilation since the December, 2020 CT, though I do not identify an obstructing stone or mass. The common bile duct measures up to approximately 13 mm diameter. Mild gallbladder distension. No CT evidence of cholelithiasis or pericholecystic inflammation. Pancreas: Mild to moderate atrophy. Mild of the pancreatic duct in the head and proximal body up to approximately 4 mm. No mass or peripancreatic inflammation. Spleen: Normal in size and appearance. Adrenals/Urinary Tract: Normal appearing adrenal glands. Benign approximate 8 mm cortical cyst arising from the LOWER pole of  the RIGHT kidney, unchanged. No significant focal parenchymal abnormality involving either kidney. No hydronephrosis. No urinary tract calculi on either side. Normal appearing urinary bladder. Stomach/Bowel: Small hiatal hernia, unchanged. Otherwise normal appearing decompressed J-shaped stomach. Normal-appearing small bowel. Mobile cecum positioned in the RIGHT UPPER QUADRANT of the abdomen extending to the midline. Expected colonic stool burden. Extensive sigmoid colon diverticulosis without evidence of acute diverticulitis. Remainder of the colon tortuous and elongated without focal abnormality elsewhere. Appendix is inconspicuous, but no evidence of pericecal inflammation. Vascular/Lymphatic: Severe aortoiliofemoral atherosclerosis. No evidence of abdominal aortic aneurysm. Approximate 2.0 cm aneurysm involving the LEFT common iliac artery.  Normal-appearing portal venous and systemic venous systems. No pathologic lymphadenopathy. Reproductive: Surgically absent uterus.  No adnexal masses. Other: None. Musculoskeletal: Osseous demineralization. Augmentation of vertebral body fractures at T11, T12 and L3. Compression fractures of L1 and L4, unchanged. Severe spinal stenosis at the L3 level due to the retropulsion of the compression fracture at this level, unchanged. No acute findings. IMPRESSION: 1. Progressive intra and extrahepatic biliary ductal dilation since the December, 2020 CT, without evidence of an obstructing stone or mass. ERCP or MRCP may be helpful in further evaluation to exclude an occult ampullary mass. 2. Stable CT findings of hepatic cirrhosis. Stable benign hemangioma involving the LEFT hepatic lobe. 3. Extensive sigmoid colon diverticulosis without evidence of acute diverticulitis. 4. Stable small hiatal hernia. 5. Stable mild pancreatic atrophy. 6. 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 Aortic Atherosclerosis (ICD10-I70.0). Electronically Signed   By: Evangeline Dakin M.D.   On: 07/31/2020 12:26   MR 3D Recon At Scanner  Result Date: 08/02/2020 CLINICAL DATA:  Abdominal pain. EXAM: MRI ABDOMEN WITHOUT AND WITH CONTRAST (INCLUDING MRCP) TECHNIQUE: Multiplanar multisequence MR imaging of the abdomen was performed both before and after the administration of intravenous contrast. Heavily T2-weighted images of the biliary and pancreatic ducts were obtained, and three-dimensional MRCP images were rendered by post processing. CONTRAST:  75m GADAVIST GADOBUTROL 1 MMOL/ML IV SOLN COMPARISON:  CT scan 07/31/2020 FINDINGS: Lower chest: The lung bases are grossly clear. Mild cardiac enlargement. Moderate tortuosity and calcification of the lower thoracic aorta. Hepatobiliary: 2 left hepatic lobe lesions are again demonstrated. The 15 mm segment 2 lesion demonstrates bright T2 signal intensity and peripheral nodular  enhancement with incomplete filling in on delayed images. The segment 3 lesion noted peripherally measures 8.5 mm and also demonstrates bright T2 signal intensity and peripheral nodular enhancement. These lesions are consistent with benign hepatic hemangiomas and are unchanged since the prior MRI from 02/03/2019 and also a prior CT scan from 2019. No intra or extrahepatic biliary dilatation given the patient's age. The common bile duct measures 8 mm which is within normal limits. No common bile duct stones are identified. The gallbladder is unremarkable. No gallstones. No obvious cystic duct stones. Pancreas:  No mass, inflammation or ductal dilatation. Spleen:  Normal size. No focal lesions. Adrenals/Urinary Tract: The adrenal glands and kidneys are grossly normal. Stomach/Bowel: Visualized portions within the abdomen are grossly unremarkable. Vascular/Lymphatic: Aortic atherosclerosis but no aneurysm or dissection. Other:  No ascites or abdominal wall hernia. Musculoskeletal: No significant bony findings. IMPRESSION: 1. Very limited examination. 2. No intra or extrahepatic biliary dilatation. No gallstones or common bile duct stones are identified. 3. Stable left hepatic lobe hemangiomas. 4. No acute abdominal findings, mass lesions or adenopathy. Electronically Signed   By: PMarijo SanesM.D.   On: 08/02/2020 05:47   MR ABDOMEN MRCP W WO CONTAST  Result Date: 08/02/2020 CLINICAL DATA:  Abdominal pain. EXAM: MRI ABDOMEN WITHOUT AND WITH CONTRAST (INCLUDING MRCP) TECHNIQUE: Multiplanar multisequence MR imaging of the abdomen was performed both before and after the administration of intravenous contrast. Heavily T2-weighted images of the biliary and pancreatic ducts were obtained, and three-dimensional MRCP images were rendered by post processing. CONTRAST:  67m GADAVIST GADOBUTROL 1 MMOL/ML IV SOLN COMPARISON:  CT scan 07/31/2020 FINDINGS: Lower chest: The lung bases are grossly clear. Mild cardiac enlargement.  Moderate tortuosity and calcification of the lower thoracic aorta. Hepatobiliary: 2 left hepatic lobe lesions are again demonstrated. The 15 mm segment 2 lesion demonstrates bright T2 signal intensity and peripheral nodular enhancement with incomplete filling in on delayed images. The segment 3 lesion noted peripherally measures 8.5 mm and also demonstrates bright T2 signal intensity and peripheral nodular enhancement. These lesions are consistent with benign hepatic hemangiomas and are unchanged since the prior MRI from 02/03/2019 and also a prior CT scan from 2019. No intra or extrahepatic biliary dilatation given the patient's age. The common bile duct measures 8 mm which is within normal limits. No common bile duct stones are identified. The gallbladder is unremarkable. No gallstones. No obvious cystic duct stones. Pancreas:  No mass, inflammation or ductal dilatation. Spleen:  Normal size. No focal lesions. Adrenals/Urinary Tract: The adrenal glands and kidneys are grossly normal. Stomach/Bowel: Visualized portions within the abdomen are grossly unremarkable. Vascular/Lymphatic: Aortic atherosclerosis but no aneurysm or dissection. Other:  No ascites or abdominal wall hernia. Musculoskeletal: No significant bony findings. IMPRESSION: 1. Very limited examination. 2. No intra or extrahepatic biliary dilatation. No gallstones or common bile duct stones are identified. 3. Stable left hepatic lobe hemangiomas. 4. No acute abdominal findings, mass lesions or adenopathy. Electronically Signed   By: PMarijo SanesM.D.   On: 08/02/2020 05:47       LAB RESULTS: Basic Metabolic Panel: Recent Labs  Lab 08/01/20 0345 08/02/20 0402  NA 136 138  K 3.6 4.0  CL 102 102  CO2 23 24  GLUCOSE 99 96  BUN 13 16  CREATININE 0.63 0.62  CALCIUM 8.4* 8.7*   Liver Function Tests: Recent Labs  Lab 08/01/20 0345 08/02/20 0402  AST 93* 63*  ALT 58* 54*  ALKPHOS 85 78  BILITOT 0.5 0.3  PROT 5.9* 6.0*  ALBUMIN  3.2* 3.1*   Recent Labs  Lab 07/31/20 0923  LIPASE 17   No results for input(s): AMMONIA in the last 168 hours. CBC: Recent Labs  Lab 07/31/20 0923 07/31/20 0923 08/01/20 0345  WBC 7.7  --  5.7  NEUTROABS 6.6  --   --   HGB 13.1  --  12.1  HCT 40.6  --  36.1  MCV 101.0*   < > 101.4*  PLT 136*  --  140*   < > = values in this interval not displayed.   Cardiac Enzymes: No results for input(s): CKTOTAL, CKMB, CKMBINDEX, TROPONINI in the last 168 hours. BNP: Invalid input(s): POCBNP CBG: No results for input(s): GLUCAP in the last 168 hours.     Disposition and Follow-up: Discharge Instructions    Diet - low sodium heart healthy   Complete by: As directed    Discharge instructions   Complete by: As directed    Please hold pravachol for a few days until Liver function tests are normal.   Increase activity slowly   Complete by: As directed        DISPOSITION: Home   DISCHARGE FOLLOW-UP  Follow-up Information    Kayla Prose, MD. Schedule an appointment as soon as possible for a visit in 1 week(s).   Specialty: Family Medicine Why: for hospital follow-up, please check liver function tests on appointment Contact information: [address] [place] [address] [phone]        Lelon Perla, MD .   Specialty: Cardiology Contact information: [address] [place] [address] [phone]                Time coordinating discharge:  35 minutes  Signed:   Estill Cotta M.D. [facility] 08/02/2020, 1:19 PM

## 2020-08-02 NOTE — Plan of Care (Signed)
Plan of care reviewed and discussed with the patient. 

## 2020-08-05 DIAGNOSIS — I110 Hypertensive heart disease with heart failure: Secondary | ICD-10-CM | POA: Diagnosis not present

## 2020-08-05 DIAGNOSIS — R7401 Elevation of levels of liver transaminase levels: Secondary | ICD-10-CM | POA: Diagnosis not present

## 2020-08-05 DIAGNOSIS — I5032 Chronic diastolic (congestive) heart failure: Secondary | ICD-10-CM | POA: Diagnosis not present

## 2020-08-05 DIAGNOSIS — N390 Urinary tract infection, site not specified: Secondary | ICD-10-CM | POA: Diagnosis not present

## 2020-08-05 LAB — CULTURE, BLOOD (ROUTINE X 2)
Culture: NO GROWTH
Culture: NO GROWTH
Special Requests: ADEQUATE

## 2020-08-13 DIAGNOSIS — E039 Hypothyroidism, unspecified: Secondary | ICD-10-CM | POA: Diagnosis not present

## 2020-08-13 DIAGNOSIS — E7800 Pure hypercholesterolemia, unspecified: Secondary | ICD-10-CM | POA: Diagnosis not present

## 2020-08-13 DIAGNOSIS — I480 Paroxysmal atrial fibrillation: Secondary | ICD-10-CM | POA: Diagnosis not present

## 2020-08-13 DIAGNOSIS — M810 Age-related osteoporosis without current pathological fracture: Secondary | ICD-10-CM | POA: Diagnosis not present

## 2020-08-13 DIAGNOSIS — I2510 Atherosclerotic heart disease of native coronary artery without angina pectoris: Secondary | ICD-10-CM | POA: Diagnosis not present

## 2020-08-13 DIAGNOSIS — I5032 Chronic diastolic (congestive) heart failure: Secondary | ICD-10-CM | POA: Diagnosis not present

## 2020-08-13 DIAGNOSIS — I110 Hypertensive heart disease with heart failure: Secondary | ICD-10-CM | POA: Diagnosis not present

## 2020-08-13 DIAGNOSIS — I10 Essential (primary) hypertension: Secondary | ICD-10-CM | POA: Diagnosis not present

## 2020-08-16 ENCOUNTER — Other Ambulatory Visit: Payer: Self-pay

## 2020-08-26 DIAGNOSIS — I480 Paroxysmal atrial fibrillation: Secondary | ICD-10-CM | POA: Diagnosis not present

## 2020-08-26 DIAGNOSIS — M810 Age-related osteoporosis without current pathological fracture: Secondary | ICD-10-CM | POA: Diagnosis not present

## 2020-08-26 DIAGNOSIS — E7800 Pure hypercholesterolemia, unspecified: Secondary | ICD-10-CM | POA: Diagnosis not present

## 2020-08-26 DIAGNOSIS — I2510 Atherosclerotic heart disease of native coronary artery without angina pectoris: Secondary | ICD-10-CM | POA: Diagnosis not present

## 2020-08-26 DIAGNOSIS — I5032 Chronic diastolic (congestive) heart failure: Secondary | ICD-10-CM | POA: Diagnosis not present

## 2020-08-26 DIAGNOSIS — I10 Essential (primary) hypertension: Secondary | ICD-10-CM | POA: Diagnosis not present

## 2020-08-26 DIAGNOSIS — E039 Hypothyroidism, unspecified: Secondary | ICD-10-CM | POA: Diagnosis not present

## 2020-08-26 DIAGNOSIS — I110 Hypertensive heart disease with heart failure: Secondary | ICD-10-CM | POA: Diagnosis not present

## 2020-09-15 ENCOUNTER — Other Ambulatory Visit: Payer: Self-pay | Admitting: Cardiology

## 2020-09-15 DIAGNOSIS — I480 Paroxysmal atrial fibrillation: Secondary | ICD-10-CM

## 2020-09-28 DIAGNOSIS — Z0000 Encounter for general adult medical examination without abnormal findings: Secondary | ICD-10-CM | POA: Diagnosis not present

## 2020-09-28 DIAGNOSIS — I110 Hypertensive heart disease with heart failure: Secondary | ICD-10-CM | POA: Diagnosis not present

## 2020-09-28 DIAGNOSIS — K9089 Other intestinal malabsorption: Secondary | ICD-10-CM | POA: Diagnosis not present

## 2020-09-28 DIAGNOSIS — I480 Paroxysmal atrial fibrillation: Secondary | ICD-10-CM | POA: Diagnosis not present

## 2020-09-28 DIAGNOSIS — F0390 Unspecified dementia without behavioral disturbance: Secondary | ICD-10-CM | POA: Diagnosis not present

## 2020-09-28 DIAGNOSIS — I700 Atherosclerosis of aorta: Secondary | ICD-10-CM | POA: Diagnosis not present

## 2020-09-28 DIAGNOSIS — E039 Hypothyroidism, unspecified: Secondary | ICD-10-CM | POA: Diagnosis not present

## 2020-09-28 DIAGNOSIS — Z1389 Encounter for screening for other disorder: Secondary | ICD-10-CM | POA: Diagnosis not present

## 2020-09-28 DIAGNOSIS — D6869 Other thrombophilia: Secondary | ICD-10-CM | POA: Diagnosis not present

## 2020-09-28 DIAGNOSIS — E7800 Pure hypercholesterolemia, unspecified: Secondary | ICD-10-CM | POA: Diagnosis not present

## 2020-09-28 DIAGNOSIS — I5032 Chronic diastolic (congestive) heart failure: Secondary | ICD-10-CM | POA: Diagnosis not present

## 2020-09-28 DIAGNOSIS — M810 Age-related osteoporosis without current pathological fracture: Secondary | ICD-10-CM | POA: Diagnosis not present

## 2020-09-29 DIAGNOSIS — I10 Essential (primary) hypertension: Secondary | ICD-10-CM | POA: Diagnosis not present

## 2020-09-29 DIAGNOSIS — E7800 Pure hypercholesterolemia, unspecified: Secondary | ICD-10-CM | POA: Diagnosis not present

## 2020-09-29 DIAGNOSIS — I5032 Chronic diastolic (congestive) heart failure: Secondary | ICD-10-CM | POA: Diagnosis not present

## 2020-09-29 DIAGNOSIS — I480 Paroxysmal atrial fibrillation: Secondary | ICD-10-CM | POA: Diagnosis not present

## 2020-09-29 DIAGNOSIS — F0390 Unspecified dementia without behavioral disturbance: Secondary | ICD-10-CM | POA: Diagnosis not present

## 2020-09-29 DIAGNOSIS — I2510 Atherosclerotic heart disease of native coronary artery without angina pectoris: Secondary | ICD-10-CM | POA: Diagnosis not present

## 2020-09-29 DIAGNOSIS — M810 Age-related osteoporosis without current pathological fracture: Secondary | ICD-10-CM | POA: Diagnosis not present

## 2020-09-29 DIAGNOSIS — I110 Hypertensive heart disease with heart failure: Secondary | ICD-10-CM | POA: Diagnosis not present

## 2020-09-29 DIAGNOSIS — E039 Hypothyroidism, unspecified: Secondary | ICD-10-CM | POA: Diagnosis not present

## 2020-10-07 DIAGNOSIS — Z7901 Long term (current) use of anticoagulants: Secondary | ICD-10-CM | POA: Diagnosis not present

## 2020-10-07 DIAGNOSIS — N390 Urinary tract infection, site not specified: Secondary | ICD-10-CM | POA: Diagnosis not present

## 2020-10-07 DIAGNOSIS — I480 Paroxysmal atrial fibrillation: Secondary | ICD-10-CM | POA: Diagnosis not present

## 2020-10-11 ENCOUNTER — Other Ambulatory Visit: Payer: Self-pay | Admitting: Cardiology

## 2020-10-11 ENCOUNTER — Telehealth: Payer: Self-pay | Admitting: Cardiology

## 2020-10-11 NOTE — Telephone Encounter (Signed)
Patient is on the appropriate dose of Eliquis. She should probably be seen by urology. CHADSVASC is 6 I will let Dr. Stanford Breed weigh in as to if she should hold Eliquis.

## 2020-10-11 NOTE — Telephone Encounter (Signed)
Spoke with patient's spouse per DPR. Last Friday patient noticed bleeding in her urine and went to her PCP. She was prescribed Cipro and sent home. This morning the bleeding still has not cleared so they contacted their PCP again. It was recommended that patient contact Dr. Jacalyn Lefevre office to see if the Eliquis could be the cause of the bleeding and if the dosage should be adjusted. Patient has not been referred to urology at this time.   Will route the message to Dr. Stanford Breed and Pharm D for review.

## 2020-10-11 NOTE — Telephone Encounter (Signed)
*  STAT* If patient is at the pharmacy, call can be transferred to refill team.   1. Which medications need to be refilled? (please list name of each medication and dose if known)  potassium chloride SA (KLOR-CON) 20 MEQ tablet  2. Which pharmacy/location (including street and city if local pharmacy) is medication to be sent to? [facility] [phone] - [place], [place] - [address]  3. Do they need a 30 day or 90 day supply? 90 with refills

## 2020-10-11 NOTE — Telephone Encounter (Signed)
Pt c/o medication issue:  1. Name of Medication: ELIQUIS 2.5 MG TABS tablet  2. How are you currently taking this medication (dosage and times per day)? As directed  3. Are you having a reaction (difficulty breathing--STAT)? yes  4. What is your medication issue? Per husband, patient is having blood in her urine. Her PCP thinks she may need to reduce or stop eliquis but is leaving the decision up to Dr. Stanford Breed. Please advise.

## 2020-10-12 MED ORDER — POTASSIUM CHLORIDE CRYS ER 20 MEQ PO TBCR: 20.0000 meq | EXTENDED_RELEASE_TABLET | Freq: Every day | ORAL | 1 refills | Status: DC

## 2020-10-12 NOTE — Telephone Encounter (Signed)
Hold apixaban until hematuria improves and then resume; agree with urology eval [name]

## 2020-10-12 NOTE — Telephone Encounter (Signed)
Spoke with pt husband, Aware of dr Jacalyn Lefevre recommendations. If the bleeding returns once she restarts the eliquis, he will call for a referral.

## 2020-12-06 NOTE — Progress Notes (Signed)
HPI: Follow-up CADstatus post coronary artery bypass graft, Bentall/AVR and maze procedure. She underwent a Bentall procedure by Dr. Servando Snare on 03/31/13. 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 Preoperatively she had been in and out of atrial fibrillation. Carotid Dopplers March 2017 showed 1-39% bilateral stenosis.Previously seen with recurrent atrial fibrillation and CHF; diuretics increased and amiodarone initiated; had repeat DCCV 10/22/17. Seen in fu and noted to be in atrial flutter. Plan was repeat DCCV following full amiodarone load.Last echocardiogram November 2018 showed normal LV function, bioprosthetic aortic valve, mild mitral regurgitation, mild left atrial enlargement, severe right atrial enlargement and severe TR. 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 Since last seen,she denies increased dyspnea, chest pain, palpitations, syncope, bleeding or pedal edema.  Current Outpatient Medications  Medication Sig Dispense Refill  . acetaminophen (TYLENOL) 500 MG tablet Take 1 tablet (500 mg total) by mouth every 6 (six) hours as needed for mild pain (pain.). 30 tablet 0  . ALPRAZolam (XANAX) 0.5 MG tablet Take 0.5 mg by mouth at bedtime.    Marland Kitchen amiodarone (PACERONE) 200 MG tablet TAKE 1 TABLET BY MOUTH DAILY 180 tablet 1  . ELIQUIS 2.5 MG TABS tablet TAKE 1 TABLET TWICE A DAY (Patient taking differently: Take 2.5 mg by mouth 2 (two) times daily.) 180 tablet 3  . furosemide (LASIX) 40 MG tablet Take 1 tablet daily and can take extra 1 tablet as needed for swelling (Patient taking differently: Take 20 mg by mouth daily.) 90 tablet 3  . lactase (LACTAID) 3000 UNITS tablet Take 1 tablet by mouth daily as needed (only when eating dairy).    Marland Kitchen levothyroxine (SYNTHROID, LEVOTHROID) 75 MCG tablet Take 75 mcg by mouth at bedtime.    .  Multiple Vitamin (MULTIVITAMIN WITH MINERALS) TABS tablet Take 1 tablet by mouth daily.    Marland Kitchen OVER THE COUNTER MEDICATION Take 1 tablet by mouth with breakfast, with lunch, and with evening meal. Golo supplement    . OVER THE COUNTER MEDICATION Take 1 tablet by mouth daily. prevagin for memory OTC    . potassium chloride SA (KLOR-CON) 20 MEQ tablet Take 1 tablet (20 mEq total) by mouth daily. 90 tablet 1  . pravastatin (PRAVACHOL) 40 MG tablet Take 1 tablet (40 mg total) by mouth daily. HOLD until liver function tests are normal 90 tablet 3  . rOPINIRole (REQUIP) 0.25 MG tablet Take 0.25 mg by mouth at bedtime.     No current facility-administered medications for this visit.     Past Medical History:  Diagnosis Date  . Anxiety    takes xanax for sleep.   . Aortic insufficiency    a. 03/2013 s/p  AVR w/ biologic 71mm Magna Ease peric tissue valve, model 300TFX, ser# [id number].   . Back pain    BACK BRACE/FRACTURE  . CAD (coronary artery disease)    a. 01/2013 mod calcification w/o sev dzs;  b. 03/2013 CABG x1 VG->RCA @ time of AVR  . Chronic diastolic CHF (congestive heart failure) ([place])    a. 01/2013 echo: EF 50-55%.  . Compression fracture of body of thoracic vertebra (HCC)    T-11  . Dementia ([place])   . Dilated aortic root ([place])    a. 03/2013 s/p Bentall procedure with Ao Root replacement (72mm Valsalva graft) w/ reimplantationof the R and L coronary ostium.  Marland Kitchen  Dysrhythmia    A. FIB  . Fibromyalgia   . Headache(784.0)    hx migraines  . Hypothyroidism   . Intracranial aneurysm    in late 1970's  . 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 Neoadjuvant RT by Dr. Yisroel Ramming. Surveillance plan: MRI 12/08/11 shows changes in presumed post-op seroma/hematoma with slight increase in size - follow up MRI 11/18/12 showed same size. Will continue with q3 month MRI. CT C/A/P annually alternating with CXR at  6 months.   . Osteoporosis   . Paroxysmal A-fib ([place])    a. Dx 01/2013 - placed on xarelto;  b. 03/2013 s/p left sided MAZE and LA clip @ time of AVR/CABG.  . Peripheral neuropathy   . PMR (polymyalgia rheumatica) (HCC)    Inactive  . Rheumatic heart disease   . [facility])    radiation & left lower extremity s/p resection in Feb 2012  . Vertigo     Past Surgical History:  Procedure Laterality Date  . ABDOMINAL HYSTERECTOMY    . ASCENDING AORTIC ROOT REPLACEMENT N/A 03/31/2013   Procedure: ASCENDING AORTIC ROOT REPLACEMENT;  Surgeon: Grace Isaac, MD;  Location: [place];  Service: Open Heart Surgery;  Laterality: N/A; clip inserted.  Marland Kitchen BLADDER SUSPENSION    . CARDIOVERSION N/A 01/29/2017   Procedure: CARDIOVERSION;  Surgeon: Jerline Pain, MD;  Location: [place];  Service: Cardiovascular;  Laterality: N/A;  . CARDIOVERSION N/A 10/22/2017   Procedure: CARDIOVERSION;  Surgeon: Lelon Perla, MD;  Location: [facility] ENDOSCOPY;  Service: Cardiovascular;  Laterality: N/A;  . COLONOSCOPY  10/25/2011   Procedure: COLONOSCOPY;  Surgeon: Garlan Fair, MD;  Location: [facility] ENDOSCOPY;  Service: Endoscopy;  Laterality: N/A;  . EYE SURGERY Bilateral    cataracts  . INTRAOPERATIVE TRANSESOPHAGEAL ECHOCARDIOGRAM N/A 03/31/2013   Procedure: INTRAOPERATIVE TRANSESOPHAGEAL ECHOCARDIOGRAM;  Surgeon: Grace Isaac, MD;  Location: [place];  Service: Open Heart Surgery;  Laterality: N/A;  . KYPHOPLASTY N/A 05/07/2018   Procedure: KYPHOPLASTY LUMBAR THREE;  Surgeon: Melina Schools, MD;  Location: [place];  Service: Orthopedics;  Laterality: N/A;  . KYPHOPLASTY N/A 05/14/2019   Procedure: KYPHOPLASTY T11;  Surgeon: Melina Schools, MD;  Location: [place];  Service: Orthopedics;  Laterality: N/A;  90 mins  . KYPHOPLASTY N/A 07/08/2019   Procedure: KYPHOPLASTY THORACIC TWELVE;  Surgeon: Melina Schools, MD;  Location: [place];  Service: Orthopedics;  Laterality: N/A;  60 mins  . LEFT AND RIGHT HEART  CATHETERIZATION WITH CORONARY ANGIOGRAM Right 02/02/2013   Procedure: LEFT AND RIGHT HEART CATHETERIZATION WITH CORONARY ANGIOGRAM;  Surgeon: Hillary Bow, MD;  Location: [facility] CATH LAB;  Service: Cardiovascular;  Laterality: Right;  Marland Kitchen MAZE N/A 03/31/2013   Procedure: MAZE;  Surgeon: Grace Isaac, MD;  Location: [place];  Service: Open Heart Surgery;  Laterality: N/A;  . Sarcoma resection  2011    Social History   Socioeconomic History  . Marital status: Married    Spouse name: Not on file  . Number of children: Not on file  . Years of education: Not on file  . Highest education level: Not on file  Occupational History  . Not on file  Tobacco Use  . Smoking status: Never Smoker  . Smokeless tobacco: Never Used  Vaping Use  . Vaping Use: Never used  Substance and Sexual Activity  . Alcohol use: No  . Drug use: No  .  Sexual activity: Yes    Birth control/protection: Post-menopausal  Other Topics Concern  . Not on file  Social History Narrative  . Not on file   Social Determinants of Health   Financial Resource Strain: Not on file  Food Insecurity: Not on file  Transportation Needs: Not on file  Physical Activity: Not on file  Stress: Not on file  Social Connections: Not on file  Intimate Partner Violence: Not on file    Family History  Problem Relation Age of Onset  . Heart disease Mother   . Stroke Mother   . Arthritis Father   . Cancer Father   . Anesthesia problems Brother   . Hypertension Sister     ROS: no fevers or chills, productive cough, hemoptysis, dysphasia, odynophagia, melena, hematochezia, dysuria, hematuria, rash, seizure activity, orthopnea, PND, pedal edema, claudication. Remaining systems are negative.  Physical Exam: Well-developed well-nourished in no acute distress.  Skin is warm and dry.  HEENT is normal.  Neck is supple.  Chest is clear to auscultation with normal expansion.  Cardiovascular exam is regular rate and rhythm.  2/6  systolic murmur left sternal border. Abdominal exam nontender or distended. No masses palpated. Extremities show no edema. neuro grossly intact   A/P  1 paroxysmal atrial fibrillation-continue amiodarone at present dose. Continue apixaban.  2 coronary artery disease-continue statin.  She is not on aspirin given need for apixaban.  3 hypertension-patient's blood pressure is elevated; however it is controlled at home.  Continue present medications and follow.  4 previous aortic valve replacement-continue SBE prophylaxis.  Repeat echocardiogram.  5 severe tricuspid regurgitation-conservative measures given patient's age and overall medical condition.  6 hyperlipidemia-continue statin.  7 chronic diastolic congestive heart failure-she appears to be euvolemic today.  She is only taking Lasix and potassium occasionally which I think is fine as long as she remains euvolemic.  We discussed fluid restriction and low-sodium diet.  [name], MD

## 2020-12-12 ENCOUNTER — Ambulatory Visit (INDEPENDENT_AMBULATORY_CARE_PROVIDER_SITE_OTHER): Payer: Medicare Other | Admitting: Cardiology

## 2020-12-12 ENCOUNTER — Encounter: Payer: Self-pay | Admitting: Cardiology

## 2020-12-12 ENCOUNTER — Other Ambulatory Visit: Payer: Self-pay

## 2020-12-12 VITALS — BP 159/78 | HR 64 | Ht 65.0 in | Wt 131.0 lb

## 2020-12-12 DIAGNOSIS — I10 Essential (primary) hypertension: Secondary | ICD-10-CM | POA: Diagnosis not present

## 2020-12-12 DIAGNOSIS — I480 Paroxysmal atrial fibrillation: Secondary | ICD-10-CM | POA: Diagnosis not present

## 2020-12-12 DIAGNOSIS — I5032 Chronic diastolic (congestive) heart failure: Secondary | ICD-10-CM

## 2020-12-12 DIAGNOSIS — I2510 Atherosclerotic heart disease of native coronary artery without angina pectoris: Secondary | ICD-10-CM | POA: Diagnosis not present

## 2020-12-12 DIAGNOSIS — I359 Nonrheumatic aortic valve disorder, unspecified: Secondary | ICD-10-CM | POA: Diagnosis not present

## 2020-12-12 NOTE — Patient Instructions (Signed)

## 2021-01-05 ENCOUNTER — Ambulatory Visit (HOSPITAL_COMMUNITY): Payer: Medicare Other | Attending: Internal Medicine

## 2021-01-05 ENCOUNTER — Other Ambulatory Visit: Payer: Self-pay

## 2021-01-05 DIAGNOSIS — I359 Nonrheumatic aortic valve disorder, unspecified: Secondary | ICD-10-CM | POA: Insufficient documentation

## 2021-01-05 LAB — ECHOCARDIOGRAM COMPLETE
AR max vel: 1.49 cm2
AV Area VTI: 1.59 cm2
AV Area mean vel: 1.53 cm2
AV Mean grad: 8 mmHg
AV Peak grad: 17.5 mmHg
Ao pk vel: 2.09 m/s
Area-P 1/2: 3.65 cm2
S' Lateral: 1.5 cm

## 2021-03-22 DIAGNOSIS — F0390 Unspecified dementia without behavioral disturbance: Secondary | ICD-10-CM | POA: Diagnosis not present

## 2021-03-22 DIAGNOSIS — I2510 Atherosclerotic heart disease of native coronary artery without angina pectoris: Secondary | ICD-10-CM | POA: Diagnosis not present

## 2021-03-22 DIAGNOSIS — M810 Age-related osteoporosis without current pathological fracture: Secondary | ICD-10-CM | POA: Diagnosis not present

## 2021-03-22 DIAGNOSIS — E7800 Pure hypercholesterolemia, unspecified: Secondary | ICD-10-CM | POA: Diagnosis not present

## 2021-03-22 DIAGNOSIS — I5032 Chronic diastolic (congestive) heart failure: Secondary | ICD-10-CM | POA: Diagnosis not present

## 2021-03-22 DIAGNOSIS — I10 Essential (primary) hypertension: Secondary | ICD-10-CM | POA: Diagnosis not present

## 2021-03-22 DIAGNOSIS — I110 Hypertensive heart disease with heart failure: Secondary | ICD-10-CM | POA: Diagnosis not present

## 2021-03-22 DIAGNOSIS — I480 Paroxysmal atrial fibrillation: Secondary | ICD-10-CM | POA: Diagnosis not present

## 2021-03-22 DIAGNOSIS — E039 Hypothyroidism, unspecified: Secondary | ICD-10-CM | POA: Diagnosis not present

## 2021-03-28 ENCOUNTER — Other Ambulatory Visit: Payer: Self-pay | Admitting: Cardiology

## 2021-03-28 NOTE — Telephone Encounter (Signed)
46f, 59.4kg, scr 0.62 08/02/20, lovw/crenshaw 12/12/20

## 2021-04-05 DIAGNOSIS — M810 Age-related osteoporosis without current pathological fracture: Secondary | ICD-10-CM | POA: Diagnosis not present

## 2021-05-05 DIAGNOSIS — I2510 Atherosclerotic heart disease of native coronary artery without angina pectoris: Secondary | ICD-10-CM | POA: Diagnosis not present

## 2021-05-05 DIAGNOSIS — E039 Hypothyroidism, unspecified: Secondary | ICD-10-CM | POA: Diagnosis not present

## 2021-05-05 DIAGNOSIS — I10 Essential (primary) hypertension: Secondary | ICD-10-CM | POA: Diagnosis not present

## 2021-05-05 DIAGNOSIS — I480 Paroxysmal atrial fibrillation: Secondary | ICD-10-CM | POA: Diagnosis not present

## 2021-05-05 DIAGNOSIS — F0390 Unspecified dementia without behavioral disturbance: Secondary | ICD-10-CM | POA: Diagnosis not present

## 2021-05-05 DIAGNOSIS — I5032 Chronic diastolic (congestive) heart failure: Secondary | ICD-10-CM | POA: Diagnosis not present

## 2021-05-05 DIAGNOSIS — I110 Hypertensive heart disease with heart failure: Secondary | ICD-10-CM | POA: Diagnosis not present

## 2021-05-05 DIAGNOSIS — E7800 Pure hypercholesterolemia, unspecified: Secondary | ICD-10-CM | POA: Diagnosis not present

## 2021-05-05 DIAGNOSIS — M810 Age-related osteoporosis without current pathological fracture: Secondary | ICD-10-CM | POA: Diagnosis not present

## 2021-05-16 ENCOUNTER — Other Ambulatory Visit: Payer: Self-pay | Admitting: Cardiology

## 2021-05-28 DIAGNOSIS — Z20822 Contact with and (suspected) exposure to covid-19: Secondary | ICD-10-CM | POA: Diagnosis not present

## 2021-05-31 DIAGNOSIS — G894 Chronic pain syndrome: Secondary | ICD-10-CM | POA: Diagnosis not present

## 2021-07-18 DIAGNOSIS — E039 Hypothyroidism, unspecified: Secondary | ICD-10-CM | POA: Diagnosis not present

## 2021-07-18 DIAGNOSIS — I110 Hypertensive heart disease with heart failure: Secondary | ICD-10-CM | POA: Diagnosis not present

## 2021-07-18 DIAGNOSIS — F0390 Unspecified dementia without behavioral disturbance: Secondary | ICD-10-CM | POA: Diagnosis not present

## 2021-07-18 DIAGNOSIS — M810 Age-related osteoporosis without current pathological fracture: Secondary | ICD-10-CM | POA: Diagnosis not present

## 2021-07-18 DIAGNOSIS — I5032 Chronic diastolic (congestive) heart failure: Secondary | ICD-10-CM | POA: Diagnosis not present

## 2021-07-18 DIAGNOSIS — I10 Essential (primary) hypertension: Secondary | ICD-10-CM | POA: Diagnosis not present

## 2021-07-18 DIAGNOSIS — I2510 Atherosclerotic heart disease of native coronary artery without angina pectoris: Secondary | ICD-10-CM | POA: Diagnosis not present

## 2021-07-18 DIAGNOSIS — I480 Paroxysmal atrial fibrillation: Secondary | ICD-10-CM | POA: Diagnosis not present

## 2021-07-18 DIAGNOSIS — E7800 Pure hypercholesterolemia, unspecified: Secondary | ICD-10-CM | POA: Diagnosis not present

## 2021-08-19 ENCOUNTER — Other Ambulatory Visit: Payer: Self-pay | Admitting: Cardiology

## 2021-08-19 DIAGNOSIS — I480 Paroxysmal atrial fibrillation: Secondary | ICD-10-CM

## 2021-09-05 DIAGNOSIS — I110 Hypertensive heart disease with heart failure: Secondary | ICD-10-CM | POA: Diagnosis not present

## 2021-09-05 DIAGNOSIS — I480 Paroxysmal atrial fibrillation: Secondary | ICD-10-CM | POA: Diagnosis not present

## 2021-09-05 DIAGNOSIS — I10 Essential (primary) hypertension: Secondary | ICD-10-CM | POA: Diagnosis not present

## 2021-09-05 DIAGNOSIS — I2510 Atherosclerotic heart disease of native coronary artery without angina pectoris: Secondary | ICD-10-CM | POA: Diagnosis not present

## 2021-09-05 DIAGNOSIS — F0390 Unspecified dementia without behavioral disturbance: Secondary | ICD-10-CM | POA: Diagnosis not present

## 2021-09-05 DIAGNOSIS — M810 Age-related osteoporosis without current pathological fracture: Secondary | ICD-10-CM | POA: Diagnosis not present

## 2021-09-05 DIAGNOSIS — E7800 Pure hypercholesterolemia, unspecified: Secondary | ICD-10-CM | POA: Diagnosis not present

## 2021-09-05 DIAGNOSIS — E039 Hypothyroidism, unspecified: Secondary | ICD-10-CM | POA: Diagnosis not present

## 2021-09-05 DIAGNOSIS — I5032 Chronic diastolic (congestive) heart failure: Secondary | ICD-10-CM | POA: Diagnosis not present

## 2021-09-06 NOTE — Progress Notes (Signed)
HPI: Follow-up PAF, CAD status post coronary artery bypass graft, Bentall/AVR and maze procedure. She underwent a Bentall procedure by Dr. Servando Snare on 03/31/13. 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 Preoperatively she had been in and out of atrial fibrillation. Carotid Dopplers March 2017 showed 1-39% bilateral stenosis. 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  Echocardiogram February 2022 showed normal LV function, mild left ventricular hypertrophy, moderate right ventricular enlargement, severe right atrial enlargement, mild to moderate tricuspid regurgitation, previous Bentall with aortic valve replacement and mean gradient 8 mmHg with no aortic insufficiency.  Also with history of paroxysmal atrial fibrillation.  Since last seen, she denies dyspnea, chest pain, palpitations, syncope or bleeding.  Her pedal edema is well controlled with diuretic.  Current Outpatient Medications  Medication Sig Dispense Refill   acetaminophen (TYLENOL) 500 MG tablet Take 1 tablet (500 mg total) by mouth every 6 (six) hours as needed for mild pain (pain.). 30 tablet 0   ALPRAZolam (XANAX) 0.5 MG tablet Take 0.5 mg by mouth at bedtime.     amiodarone (PACERONE) 200 MG tablet TAKE 1 TABLET BY MOUTH DAILY 180 tablet 0   ELIQUIS 2.5 MG TABS tablet TAKE 1 TABLET TWICE A DAY 180 tablet 1   furosemide (LASIX) 40 MG tablet Take 1 tablet daily and can take extra 1 tablet as needed for swelling (Patient taking differently: Take 20 mg by mouth daily.) 90 tablet 3   lactase (LACTAID) 3000 UNITS tablet Take 1 tablet by mouth daily as needed (only when eating dairy).     levothyroxine (SYNTHROID, LEVOTHROID) 75 MCG tablet Take 75 mcg by mouth at bedtime.     Multiple Vitamin (MULTIVITAMIN WITH MINERALS) TABS tablet Take 1 tablet by mouth daily.     OVER THE COUNTER  MEDICATION Take 1 tablet by mouth with breakfast, with lunch, and with evening meal. Golo supplement     OVER THE COUNTER MEDICATION Take 1 tablet by mouth daily. prevagin for memory OTC     potassium chloride SA (KLOR-CON) 20 MEQ tablet TAKE 1 TABLET(20 MEQ) BY MOUTH DAILY 90 tablet 1   pravastatin (PRAVACHOL) 40 MG tablet Take 1 tablet (40 mg total) by mouth daily. HOLD until liver function tests are normal 90 tablet 3   rOPINIRole (REQUIP) 0.25 MG tablet Take 0.25 mg by mouth at bedtime.     No current facility-administered medications for this visit.     Past Medical History:  Diagnosis Date   Anxiety    takes xanax for sleep.    Aortic insufficiency    a. 03/2013 s/p  AVR w/ biologic 16mm Magna Ease peric tissue valve, model 300TFX, ser# [id number].    Back pain    BACK BRACE/FRACTURE   CAD (coronary artery disease)    a. 01/2013 mod calcification w/o sev dzs;  b. 03/2013 CABG x1 VG->RCA @ time of AVR   Chronic diastolic CHF (congestive heart failure) ([place])    a. 01/2013 echo: EF 50-55%.   Compression fracture of body of thoracic vertebra (HCC)    T-11   Dementia ([place])    Dilated aortic root ([place])    a. 03/2013 s/p Bentall procedure with Ao Root replacement (66mm Valsalva graft) w/ reimplantationof the R and L coronary ostium.   Dysrhythmia    A. 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 Neoadjuvant RT by Dr. Yisroel Ramming. Surveillance plan: MRI 12/08/11 shows changes in presumed post-op seroma/hematoma with slight increase in size - follow up MRI 11/18/12 showed same size. Will continue with q3 month MRI. CT C/A/P annually alternating with CXR at 6 months.    Osteoporosis    Paroxysmal A-fib ([employer])    a. Dx 01/2013 - placed on xarelto;  b. 03/2013 s/p left sided MAZE and LA clip @  time of AVR/CABG.   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   BLADDER SUSPENSION     CARDIOVERSION N/A 01/29/2017   Procedure: CARDIOVERSION;  Surgeon: Jerline Pain, MD;  Location: [place];  Service: Cardiovascular;  Laterality: N/A;   CARDIOVERSION N/A 10/22/2017   Procedure: CARDIOVERSION;  Surgeon: Lelon Perla, MD;  Location: [facility] ENDOSCOPY;  Service: Cardiovascular;  Laterality: N/A;   COLONOSCOPY  10/25/2011   Procedure: COLONOSCOPY;  Surgeon: Garlan Fair, MD;  [facility] ENDOSCOPY;  Service: Endoscopy;  Laterality: N/A;   EYE SURGERY Bilateral    cataracts   INTRAOPERATIVE TRANSESOPHAGEAL ECHOCARDIOGRAM N/A 03/31/2013   Procedure: INTRAOPERATIVE TRANSESOPHAGEAL ECHOCARDIOGRAM;  Surgeon: Grace Isaac, MD;  Location: [place];  Service: Open Heart Surgery;  Laterality: N/A;   KYPHOPLASTY N/A 05/07/2018   Procedure: KYPHOPLASTY LUMBAR THREE;  Surgeon: Melina Schools, MD;  Location: [place];  Service: Orthopedics;  Laterality: N/A;   KYPHOPLASTY N/A 05/14/2019   Procedure: KYPHOPLASTY T11;  Surgeon: Melina Schools, MD;  Location: [place];  Service: Orthopedics;  Laterality: N/A;  90 mins   KYPHOPLASTY N/A 07/08/2019   Procedure: KYPHOPLASTY THORACIC TWELVE;  Surgeon: Melina Schools, MD;  Location: [place];  Service: Orthopedics;  Laterality: N/A;  60 mins   LEFT AND RIGHT HEART CATHETERIZATION WITH CORONARY ANGIOGRAM Right 02/02/2013   Procedure: LEFT AND RIGHT HEART CATHETERIZATION WITH CORONARY ANGIOGRAM;  Surgeon: Hillary Bow, MD;   [facility] CATH LAB;  Service: Cardiovascular;  Laterality: Right;   MAZE N/A 03/31/2013   Procedure: MAZE;  Surgeon: Grace Isaac, MD;  Location: [place];  Service: Open Heart Surgery;  Laterality: N/A;   Sarcoma resection  2011    Social History   Socioeconomic History   Marital status: Married    Spouse name: Not on file   Number of children: Not on file   Years of education: Not on file   Highest education level: Not on file  Occupational History   Not on file  Tobacco Use   Smoking status: Never   Smokeless tobacco: Never  Vaping Use   Vaping Use: Never used  Substance and Sexual Activity   Alcohol use: No   Drug use: No   Sexual activity: Yes    Birth control/protection: Post-menopausal  Other Topics Concern   Not on file  Social History Narrative   Not on file   Social Determinants of Health   Financial Resource Strain: Not on file  Food Insecurity: Not on file  Transportation Needs: Not on file  Physical Activity: Not on file  Stress: Not on file  Social Connections: Not on file  Intimate Partner Violence: Not on file    Family History  Problem Relation Age of Onset   Heart disease Mother    Stroke Mother    Arthritis Father    Cancer Father    Anesthesia problems Brother    Hypertension Sister     ROS: Back pain but no fevers or chills, productive cough, hemoptysis, dysphasia, odynophagia, melena, hematochezia, dysuria, hematuria, rash, seizure activity, orthopnea, PND, pedal edema, claudication. Remaining systems are negative.  Physical Exam: Well-developed frail in no acute distress.  Skin is warm and dry.  HEENT is normal.  Neck is supple.  Chest is clear to auscultation with normal expansion.  Cardiovascular exam is regular rate and rhythm.  2/6 systolic murmur left sternal border.  No diastolic murmur. Abdominal exam nontender or distended. No masses palpated. Extremities show no edema. neuro grossly intact  ECG-normal sinus rhythm at  a rate of 60, first-degree AV block, cannot rule out septal infarct, nonspecific ST changes.  Personally reviewed  A/P  1 paroxysmal atrial fibrillation-patient remains in sinus rhythm.  Continue amiodarone and apixaban.  Check hemoglobin and renal function.  We will also check TSH, liver functions and chest x-ray.  2 hypertension-blood pressure controlled.  Continue present medications.  3 hyperlipidemia-continue statin.  Check lipids and liver.  4 coronary artery disease-she is not having chest pain.  Continue statin.  No aspirin given need for anticoagulation.  5 status post AVR-continue SBE prophylaxis.  6 tricuspid regurgitation-plan conservative measures given patient's age and medical condition.  7 chronic diastolic congestive heart failure-continue Lasix at present dose.  Her volume status appears to be reasonable.  [name], MD

## 2021-09-11 ENCOUNTER — Other Ambulatory Visit: Payer: Self-pay | Admitting: Cardiology

## 2021-09-11 NOTE — Telephone Encounter (Signed)
Prescription refill request for Eliquis received. Indication: afib  Last office visit:Crenshaw, 12/12/2020 Scr: 0.7, 08/05/2020 Age: 87 yo  Weight: 59.4 kg   On the correct dose of Eliquis, overdue for labs.

## 2021-09-11 NOTE — Telephone Encounter (Signed)
Pt is scheduled to see Dr. Stanford Breed on 10/26, put on appointment note for pt to get blood work at appointment.

## 2021-09-12 ENCOUNTER — Ambulatory Visit: Payer: Medicare Other | Admitting: Cardiology

## 2021-09-13 ENCOUNTER — Encounter: Payer: Self-pay | Admitting: Cardiology

## 2021-09-13 ENCOUNTER — Other Ambulatory Visit: Payer: Self-pay

## 2021-09-13 ENCOUNTER — Ambulatory Visit (INDEPENDENT_AMBULATORY_CARE_PROVIDER_SITE_OTHER): Payer: Medicare Other | Admitting: Cardiology

## 2021-09-13 VITALS — BP 110/64 | HR 60 | Ht 65.0 in | Wt 132.0 lb

## 2021-09-13 DIAGNOSIS — I5032 Chronic diastolic (congestive) heart failure: Secondary | ICD-10-CM | POA: Diagnosis not present

## 2021-09-13 DIAGNOSIS — I10 Essential (primary) hypertension: Secondary | ICD-10-CM

## 2021-09-13 DIAGNOSIS — I359 Nonrheumatic aortic valve disorder, unspecified: Secondary | ICD-10-CM

## 2021-09-13 DIAGNOSIS — I2510 Atherosclerotic heart disease of native coronary artery without angina pectoris: Secondary | ICD-10-CM

## 2021-09-13 DIAGNOSIS — I480 Paroxysmal atrial fibrillation: Secondary | ICD-10-CM

## 2021-09-13 LAB — LIPID PANEL
Chol/HDL Ratio: 3 ratio (ref 0.0–4.4)
Cholesterol, Total: 188 mg/dL (ref 100–199)
HDL: 63 mg/dL (ref >39)
LDL Chol Calc (NIH): 105 mg/dL — ABNORMAL HIGH (ref 0–99)
Triglycerides: 113 mg/dL (ref 0–149)
VLDL Cholesterol Cal: 20 mg/dL (ref 5–40)

## 2021-09-13 LAB — CBC
Hematocrit: 38.2 % (ref 34.0–46.6)
Hemoglobin: 13.7 g/dL (ref 11.1–15.9)
MCH: 35.3 pg — ABNORMAL HIGH (ref 26.6–33.0)
MCHC: 35.9 g/dL — ABNORMAL HIGH (ref 31.5–35.7)
MCV: 99 fL — ABNORMAL HIGH (ref 79–97)
Platelets: 151 10*3/uL (ref 150–450)
RBC: 3.88 x10E6/uL (ref 3.77–5.28)
RDW: 13.3 % (ref 11.7–15.4)
WBC: 4.8 10*3/uL (ref 3.4–10.8)

## 2021-09-13 LAB — COMPREHENSIVE METABOLIC PANEL
ALT: 17 IU/L (ref 0–32)
AST: 21 IU/L (ref 0–40)
Albumin/Globulin Ratio: 2 (ref 1.2–2.2)
Albumin: 4.2 g/dL (ref 3.5–4.6)
Alkaline Phosphatase: 85 IU/L (ref 44–121)
BUN/Creatinine Ratio: 19 (ref 12–28)
BUN: 14 mg/dL (ref 10–36)
Bilirubin Total: 0.3 mg/dL (ref 0.0–1.2)
CO2: 25 mmol/L (ref 20–29)
Calcium: 9.2 mg/dL (ref 8.7–10.3)
Chloride: 102 mmol/L (ref 96–106)
Creatinine, Ser: 0.74 mg/dL (ref 0.57–1.00)
Globulin, Total: 2.1 g/dL (ref 1.5–4.5)
Glucose: 88 mg/dL (ref 70–99)
Potassium: 5.1 mmol/L (ref 3.5–5.2)
Sodium: 140 mmol/L (ref 134–144)
Total Protein: 6.3 g/dL (ref 6.0–8.5)
eGFR: 76 mL/min/{1.73_m2} (ref >59)

## 2021-09-13 LAB — TSH: TSH: 4.82 u[IU]/mL — ABNORMAL HIGH (ref 0.450–4.500)

## 2021-09-13 NOTE — Patient Instructions (Signed)
  A chest x-ray takes a picture of the organs and structures inside the chest, including the heart, lungs, and blood vessels. This test can show several things, including, whether the heart is enlarges; whether fluid is building up in the lungs; and whether pacemaker / defibrillator leads are still in place. [place] [facility] [place]    Follow-Up: At [facility], you and your health needs are our priority.  As part of our continuing mission to provide you with exceptional heart care, we have created designated Provider Care Teams.  These Care Teams include your primary Cardiologist (physician) and Advanced Practice Providers (APPs -  Physician Assistants and Nurse Practitioners) who all work together to provide you with the care you need, when you need it.  We recommend signing up for the patient portal called "MyChart".  Sign up information is provided on this After Visit Summary.  MyChart is used to connect with patients for Virtual Visits (Telemedicine).  Patients are able to view lab/test results, encounter notes, upcoming appointments, etc.  Non-urgent messages can be sent to your provider as well.   To learn more about what you can do with MyChart, go to [email or web address].    Your next appointment:   12 month(s)  The format for your next appointment:   In Person  Provider:   [name], MD

## 2021-09-14 ENCOUNTER — Encounter: Payer: Self-pay | Admitting: *Deleted

## 2021-09-16 ENCOUNTER — Other Ambulatory Visit: Payer: Self-pay | Admitting: Cardiology

## 2021-09-16 DIAGNOSIS — E785 Hyperlipidemia, unspecified: Secondary | ICD-10-CM

## 2021-09-17 DIAGNOSIS — U071 COVID-19: Secondary | ICD-10-CM | POA: Diagnosis not present

## 2021-10-11 DIAGNOSIS — I480 Paroxysmal atrial fibrillation: Secondary | ICD-10-CM | POA: Diagnosis not present

## 2021-10-11 DIAGNOSIS — F0390 Unspecified dementia without behavioral disturbance: Secondary | ICD-10-CM | POA: Diagnosis not present

## 2021-10-11 DIAGNOSIS — I5032 Chronic diastolic (congestive) heart failure: Secondary | ICD-10-CM | POA: Diagnosis not present

## 2021-10-11 DIAGNOSIS — I700 Atherosclerosis of aorta: Secondary | ICD-10-CM | POA: Diagnosis not present

## 2021-10-11 DIAGNOSIS — E440 Moderate protein-calorie malnutrition: Secondary | ICD-10-CM | POA: Diagnosis not present

## 2021-10-11 DIAGNOSIS — E7800 Pure hypercholesterolemia, unspecified: Secondary | ICD-10-CM | POA: Diagnosis not present

## 2021-10-11 DIAGNOSIS — F419 Anxiety disorder, unspecified: Secondary | ICD-10-CM | POA: Diagnosis not present

## 2021-10-11 DIAGNOSIS — Z1389 Encounter for screening for other disorder: Secondary | ICD-10-CM | POA: Diagnosis not present

## 2021-10-11 DIAGNOSIS — Z0000 Encounter for general adult medical examination without abnormal findings: Secondary | ICD-10-CM | POA: Diagnosis not present

## 2021-10-11 DIAGNOSIS — I110 Hypertensive heart disease with heart failure: Secondary | ICD-10-CM | POA: Diagnosis not present

## 2021-10-11 DIAGNOSIS — E039 Hypothyroidism, unspecified: Secondary | ICD-10-CM | POA: Diagnosis not present

## 2021-10-11 DIAGNOSIS — M810 Age-related osteoporosis without current pathological fracture: Secondary | ICD-10-CM | POA: Diagnosis not present

## 2021-10-16 ENCOUNTER — Ambulatory Visit
Admission: RE | Admit: 2021-10-16 | Discharge: 2021-10-16 | Disposition: A | Discharge status: Home / Self Care | Payer: Medicare Other | Source: Ambulatory Visit | Attending: Cardiology | Admitting: Cardiology

## 2021-10-16 DIAGNOSIS — I480 Paroxysmal atrial fibrillation: Secondary | ICD-10-CM

## 2021-10-16 DIAGNOSIS — J9811 Atelectasis: Secondary | ICD-10-CM | POA: Diagnosis not present

## 2021-10-19 DIAGNOSIS — G894 Chronic pain syndrome: Secondary | ICD-10-CM | POA: Diagnosis not present

## 2021-10-25 ENCOUNTER — Telehealth: Payer: Self-pay | Admitting: Cardiology

## 2021-10-25 DIAGNOSIS — M810 Age-related osteoporosis without current pathological fracture: Secondary | ICD-10-CM | POA: Diagnosis not present

## 2021-10-25 DIAGNOSIS — I110 Hypertensive heart disease with heart failure: Secondary | ICD-10-CM | POA: Diagnosis not present

## 2021-10-25 DIAGNOSIS — I10 Essential (primary) hypertension: Secondary | ICD-10-CM | POA: Diagnosis not present

## 2021-10-25 DIAGNOSIS — I5032 Chronic diastolic (congestive) heart failure: Secondary | ICD-10-CM | POA: Diagnosis not present

## 2021-10-25 DIAGNOSIS — I480 Paroxysmal atrial fibrillation: Secondary | ICD-10-CM | POA: Diagnosis not present

## 2021-10-25 DIAGNOSIS — F0390 Unspecified dementia without behavioral disturbance: Secondary | ICD-10-CM | POA: Diagnosis not present

## 2021-10-25 DIAGNOSIS — I2510 Atherosclerotic heart disease of native coronary artery without angina pectoris: Secondary | ICD-10-CM | POA: Diagnosis not present

## 2021-10-25 DIAGNOSIS — E039 Hypothyroidism, unspecified: Secondary | ICD-10-CM | POA: Diagnosis not present

## 2021-10-25 DIAGNOSIS — E7800 Pure hypercholesterolemia, unspecified: Secondary | ICD-10-CM | POA: Diagnosis not present

## 2021-10-25 NOTE — Telephone Encounter (Signed)
[facility] on the line wanting to know if you are able to give them a verbal of the pts lipid panel results on 09/14/21

## 2021-10-25 NOTE — Telephone Encounter (Signed)
Spoke with courtney at [employer], labs from 09/13/21 faxed to dr sun and verbal results given.

## 2021-10-26 DIAGNOSIS — Z20822 Contact with and (suspected) exposure to covid-19: Secondary | ICD-10-CM | POA: Diagnosis not present

## 2021-11-14 ENCOUNTER — Telehealth: Payer: Self-pay | Admitting: Cardiology

## 2021-11-14 DIAGNOSIS — Z79899 Other long term (current) drug therapy: Secondary | ICD-10-CM

## 2021-11-14 NOTE — Telephone Encounter (Signed)
Called pt's wife. He was informed Dr. Harriet Masson DOD wants his wife to take Lasix 40 mg (prescribed dose) twice daily for the next 4 days than go back to her regular prescribed dose. She also wants her to have labs drawn on either Friday or Monday. He verbalized understanding and stated he was going to give her the second dose of Lasix when we got off the phone. Will mail lab orders to address on file.

## 2021-11-14 NOTE — Telephone Encounter (Signed)
Pt c/o swelling: STAT is pt has developed SOB within 24 hours  If swelling, where is the swelling located? feet  How much weight have you gained and in what time span? Does not think she has gained any weight from swelling  Have you gained 3 pounds in a day or 5 pounds in a week? no  Do you have a log of your daily weights (if so, list)? no  Are you currently taking a fluid pill? yes  Are you currently SOB? no  Have you traveled recently? no  Patient's husband calling to speak with Hilda Blades. He states the patient has swelling in her feet and wants to know if her medications should be adjusted. He says she has been elevating her feet and does take lasix.

## 2021-11-14 NOTE — Telephone Encounter (Signed)
Spoke with patients husband regarding bilateral lower extremity swelling Started left side above ankles first so increased Lasix 40 mg to full tablet daily, was only taking 1/2 tablet  This change made about 2 weeks ago. No improvement in swelling, getting worse Swelling improves at night but comes back during day Denies any shortness of breath  No change in diet/salt intake. Has noticed increased urinary output  Keeps feet elevated during day, does not do much walking.  Weight couple of months ago 133-135 this am 143.9 lbs When her swelling couple weeks ago and started Lasix 40 mg daily, weight was running around 142-143  Will forward to Dr Harriet Masson DOD for review

## 2021-11-17 ENCOUNTER — Other Ambulatory Visit: Payer: Self-pay | Admitting: Cardiology

## 2021-11-21 DIAGNOSIS — Z79899 Other long term (current) drug therapy: Secondary | ICD-10-CM | POA: Diagnosis not present

## 2021-11-21 DIAGNOSIS — I480 Paroxysmal atrial fibrillation: Secondary | ICD-10-CM | POA: Diagnosis not present

## 2021-11-21 DIAGNOSIS — I2510 Atherosclerotic heart disease of native coronary artery without angina pectoris: Secondary | ICD-10-CM | POA: Diagnosis not present

## 2021-11-21 DIAGNOSIS — I359 Nonrheumatic aortic valve disorder, unspecified: Secondary | ICD-10-CM | POA: Diagnosis not present

## 2021-11-22 LAB — BASIC METABOLIC PANEL
BUN/Creatinine Ratio: 23 (ref 12–28)
BUN: 18 mg/dL (ref 10–36)
CO2: 25 mmol/L (ref 20–29)
Calcium: 8.9 mg/dL (ref 8.7–10.3)
Chloride: 100 mmol/L (ref 96–106)
Creatinine, Ser: 0.77 mg/dL (ref 0.57–1.00)
Glucose: 90 mg/dL (ref 70–99)
Potassium: 4.4 mmol/L (ref 3.5–5.2)
Sodium: 138 mmol/L (ref 134–144)
eGFR: 72 mL/min/{1.73_m2} (ref >59)

## 2021-11-22 LAB — PRO B NATRIURETIC PEPTIDE: NT-Pro BNP: 753 pg/mL — ABNORMAL HIGH (ref 0–738)

## 2021-11-22 LAB — MAGNESIUM: Magnesium: 2.1 mg/dL (ref 1.6–2.3)

## 2021-11-27 ENCOUNTER — Telehealth: Payer: Self-pay | Admitting: Cardiology

## 2021-11-27 NOTE — Telephone Encounter (Signed)
Patient's spouse is calling for lab results.

## 2021-11-27 NOTE — Telephone Encounter (Signed)
Kayla Salines, DO  11/21/2021  6:37 PM EST     Labs normal   Patient's husband called with result

## 2021-12-04 ENCOUNTER — Emergency Department (HOSPITAL_COMMUNITY): Payer: Medicare Other

## 2021-12-04 ENCOUNTER — Other Ambulatory Visit: Payer: Self-pay

## 2021-12-04 ENCOUNTER — Encounter (HOSPITAL_COMMUNITY): Payer: Self-pay

## 2021-12-04 ENCOUNTER — Inpatient Hospital Stay (HOSPITAL_COMMUNITY)
Admission: EM | Admit: 2021-12-04 | Discharge: 2021-12-06 | DRG: 552 | Disposition: A | Discharge status: Home Health | Payer: Medicare Other | Attending: Internal Medicine | Admitting: Internal Medicine

## 2021-12-04 DIAGNOSIS — Z85831 Personal history of malignant neoplasm of soft tissue: Secondary | ICD-10-CM

## 2021-12-04 DIAGNOSIS — D7589 Other specified diseases of blood and blood-forming organs: Secondary | ICD-10-CM | POA: Diagnosis present

## 2021-12-04 DIAGNOSIS — Z9889 Other specified postprocedural states: Secondary | ICD-10-CM

## 2021-12-04 DIAGNOSIS — I110 Hypertensive heart disease with heart failure: Secondary | ICD-10-CM | POA: Diagnosis present

## 2021-12-04 DIAGNOSIS — M5416 Radiculopathy, lumbar region: Secondary | ICD-10-CM | POA: Diagnosis present

## 2021-12-04 DIAGNOSIS — E039 Hypothyroidism, unspecified: Secondary | ICD-10-CM | POA: Diagnosis present

## 2021-12-04 DIAGNOSIS — M8008XA Age-related osteoporosis with current pathological fracture, vertebra(e), initial encounter for fracture: Secondary | ICD-10-CM | POA: Diagnosis present

## 2021-12-04 DIAGNOSIS — M545 Low back pain, unspecified: Secondary | ICD-10-CM | POA: Diagnosis present

## 2021-12-04 DIAGNOSIS — Z8249 Family history of ischemic heart disease and other diseases of the circulatory system: Secondary | ICD-10-CM | POA: Diagnosis not present

## 2021-12-04 DIAGNOSIS — G629 Polyneuropathy, unspecified: Secondary | ICD-10-CM

## 2021-12-04 DIAGNOSIS — M4850XA Collapsed vertebra, not elsewhere classified, site unspecified, initial encounter for fracture: Secondary | ICD-10-CM | POA: Diagnosis present

## 2021-12-04 DIAGNOSIS — M353 Polymyalgia rheumatica: Secondary | ICD-10-CM | POA: Diagnosis present

## 2021-12-04 DIAGNOSIS — Z20822 Contact with and (suspected) exposure to covid-19: Secondary | ICD-10-CM | POA: Diagnosis present

## 2021-12-04 DIAGNOSIS — E785 Hyperlipidemia, unspecified: Secondary | ICD-10-CM | POA: Diagnosis present

## 2021-12-04 DIAGNOSIS — F0394 Unspecified dementia, unspecified severity, with anxiety: Secondary | ICD-10-CM | POA: Diagnosis present

## 2021-12-04 DIAGNOSIS — I480 Paroxysmal atrial fibrillation: Secondary | ICD-10-CM | POA: Diagnosis present

## 2021-12-04 DIAGNOSIS — Z7901 Long term (current) use of anticoagulants: Secondary | ICD-10-CM

## 2021-12-04 DIAGNOSIS — I700 Atherosclerosis of aorta: Secondary | ICD-10-CM | POA: Diagnosis present

## 2021-12-04 DIAGNOSIS — Z888 Allergy status to other drugs, medicaments and biological substances status: Secondary | ICD-10-CM

## 2021-12-04 DIAGNOSIS — D696 Thrombocytopenia, unspecified: Secondary | ICD-10-CM | POA: Diagnosis present

## 2021-12-04 DIAGNOSIS — I77810 Thoracic aortic ectasia: Secondary | ICD-10-CM | POA: Diagnosis present

## 2021-12-04 DIAGNOSIS — M2578 Osteophyte, vertebrae: Secondary | ICD-10-CM | POA: Diagnosis not present

## 2021-12-04 DIAGNOSIS — Z79890 Hormone replacement therapy (postmenopausal): Secondary | ICD-10-CM

## 2021-12-04 DIAGNOSIS — Z79899 Other long term (current) drug therapy: Secondary | ICD-10-CM

## 2021-12-04 DIAGNOSIS — M549 Dorsalgia, unspecified: Secondary | ICD-10-CM | POA: Diagnosis not present

## 2021-12-04 DIAGNOSIS — M48061 Spinal stenosis, lumbar region without neurogenic claudication: Principal | ICD-10-CM | POA: Diagnosis present

## 2021-12-04 DIAGNOSIS — Z951 Presence of aortocoronary bypass graft: Secondary | ICD-10-CM | POA: Diagnosis not present

## 2021-12-04 DIAGNOSIS — Z743 Need for continuous supervision: Secondary | ICD-10-CM | POA: Diagnosis not present

## 2021-12-04 DIAGNOSIS — M797 Fibromyalgia: Secondary | ICD-10-CM | POA: Diagnosis present

## 2021-12-04 DIAGNOSIS — I5032 Chronic diastolic (congestive) heart failure: Secondary | ICD-10-CM | POA: Diagnosis present

## 2021-12-04 DIAGNOSIS — G8929 Other chronic pain: Secondary | ICD-10-CM | POA: Diagnosis present

## 2021-12-04 DIAGNOSIS — Z90710 Acquired absence of both cervix and uterus: Secondary | ICD-10-CM | POA: Diagnosis not present

## 2021-12-04 DIAGNOSIS — M5459 Other low back pain: Secondary | ICD-10-CM | POA: Diagnosis present

## 2021-12-04 DIAGNOSIS — I10 Essential (primary) hypertension: Secondary | ICD-10-CM | POA: Diagnosis present

## 2021-12-04 DIAGNOSIS — Z884 Allergy status to anesthetic agent status: Secondary | ICD-10-CM

## 2021-12-04 DIAGNOSIS — I2510 Atherosclerotic heart disease of native coronary artery without angina pectoris: Secondary | ICD-10-CM | POA: Diagnosis present

## 2021-12-04 DIAGNOSIS — Z91011 Allergy to milk products: Secondary | ICD-10-CM

## 2021-12-04 DIAGNOSIS — F419 Anxiety disorder, unspecified: Secondary | ICD-10-CM | POA: Diagnosis present

## 2021-12-04 NOTE — ED Provider Triage Note (Signed)
Emergency Medicine Provider Triage Evaluation Note  Kayla Chambers , a 88 y.o. female  was evaluated in triage.  Pt complains of worsening lower back pain for the past 2 days.  No recent falls.  History of back surgery several years ago.  She typically takes 5 mg oxycodone for it.  She called her PCP who advised that she increase to 10 mg which she did around 4 PM without any relief.  Denies any urinary symptoms.  Denies any weakness, numbness, tingling down legs.  Review of Systems  Positive: + back pain Negative: - urinary symptoms  Physical Exam  LMP  (LMP Unknown)  Gen:   Awake, no distress   Resp:  Normal effort  MSK:   Moves extremities without difficulty  Other:  Diffuse T and L midline spinal TTP  Medical Decision Making  Medically screening exam initiated at 7:40 PM.  Appropriate orders placed.  Kayla Chambers was informed that the remainder of the evaluation will be completed by another provider, this initial triage assessment does not replace that evaluation, and the importance of remaining in the ED until their evaluation is complete.     Eustaquio Maize, PA-C 12/04/21 1942

## 2021-12-04 NOTE — ED Triage Notes (Signed)
Pt BIB EMS from home with reports of lower back pain since yesterday. Pt has hx of back surgery.

## 2021-12-05 ENCOUNTER — Emergency Department (HOSPITAL_COMMUNITY): Payer: Medicare Other

## 2021-12-05 ENCOUNTER — Encounter (HOSPITAL_COMMUNITY): Payer: Self-pay | Admitting: Internal Medicine

## 2021-12-05 DIAGNOSIS — D7589 Other specified diseases of blood and blood-forming organs: Secondary | ICD-10-CM | POA: Diagnosis present

## 2021-12-05 DIAGNOSIS — M5459 Other low back pain: Secondary | ICD-10-CM | POA: Diagnosis present

## 2021-12-05 DIAGNOSIS — M545 Low back pain, unspecified: Secondary | ICD-10-CM | POA: Diagnosis not present

## 2021-12-05 DIAGNOSIS — D696 Thrombocytopenia, unspecified: Secondary | ICD-10-CM | POA: Diagnosis present

## 2021-12-05 DIAGNOSIS — I700 Atherosclerosis of aorta: Secondary | ICD-10-CM

## 2021-12-05 HISTORY — DX: Atherosclerosis of aorta: I70.0

## 2021-12-05 LAB — CBC WITH DIFFERENTIAL/PLATELET
Abs Immature Granulocytes: 0.01 10*3/uL (ref 0.00–0.07)
Basophils Absolute: 0 10*3/uL (ref 0.0–0.1)
Basophils Relative: 0 %
Eosinophils Absolute: 0 10*3/uL (ref 0.0–0.5)
Eosinophils Relative: 1 %
HCT: 38.3 % (ref 36.0–46.0)
Hemoglobin: 12.6 g/dL (ref 12.0–15.0)
Immature Granulocytes: 0 %
Lymphocytes Relative: 15 %
Lymphs Abs: 0.8 10*3/uL (ref 0.7–4.0)
MCH: 33.1 pg (ref 26.0–34.0)
MCHC: 32.9 g/dL (ref 30.0–36.0)
MCV: 100.5 fL — ABNORMAL HIGH (ref 80.0–100.0)
Monocytes Absolute: 0.5 10*3/uL (ref 0.1–1.0)
Monocytes Relative: 10 %
Neutro Abs: 4.1 10*3/uL (ref 1.7–7.7)
Neutrophils Relative %: 74 %
Platelets: 134 10*3/uL — ABNORMAL LOW (ref 150–400)
RBC: 3.81 MIL/uL — ABNORMAL LOW (ref 3.87–5.11)
RDW: 14.1 % (ref 11.5–15.5)
WBC: 5.5 10*3/uL (ref 4.0–10.5)
nRBC: 0 % (ref 0.0–0.2)

## 2021-12-05 LAB — BASIC METABOLIC PANEL
Anion gap: 10 (ref 5–15)
BUN: 10 mg/dL (ref 8–23)
CO2: 25 mmol/L (ref 22–32)
Calcium: 9 mg/dL (ref 8.9–10.3)
Chloride: 100 mmol/L (ref 98–111)
Creatinine, Ser: 0.67 mg/dL (ref 0.44–1.00)
GFR, Estimated: >60 mL/min (ref >60)
Glucose, Bld: 98 mg/dL (ref 70–99)
Potassium: 3.8 mmol/L (ref 3.5–5.1)
Sodium: 135 mmol/L (ref 135–145)

## 2021-12-05 LAB — RESP PANEL BY RT-PCR (FLU A&B, COVID) ARPGX2
Influenza A by PCR: NEGATIVE
Influenza B by PCR: NEGATIVE
SARS Coronavirus 2 by RT PCR: NEGATIVE

## 2021-12-05 LAB — PROTIME-INR
INR: 1.1 (ref 0.8–1.2)
Prothrombin Time: 14 seconds (ref 11.4–15.2)

## 2021-12-05 MED ORDER — OXYCODONE HCL 5 MG PO TABS
5.0000 mg | ORAL_TABLET | Freq: Four times a day (QID) | ORAL | Status: DC | PRN
  Administered 2021-12-05: 5 mg via ORAL
  Filled 2021-12-05 (×2): qty 1

## 2021-12-05 MED ORDER — POTASSIUM CHLORIDE CRYS ER 20 MEQ PO TBCR
20.0000 meq | EXTENDED_RELEASE_TABLET | Freq: Every day | ORAL | Status: DC
  Administered 2021-12-06: 20 meq via ORAL
  Filled 2021-12-05: qty 1

## 2021-12-05 MED ORDER — LEVOTHYROXINE SODIUM 50 MCG PO TABS
75.0000 ug | ORAL_TABLET | Freq: Every day | ORAL | Status: DC
  Administered 2021-12-06: 75 ug via ORAL
  Filled 2021-12-05: qty 1

## 2021-12-05 MED ORDER — ONDANSETRON HCL 4 MG PO TABS: 4.0000 mg | ORAL_TABLET | Freq: Four times a day (QID) | ORAL | Status: DC | PRN

## 2021-12-05 MED ORDER — FUROSEMIDE 40 MG PO TABS
40.0000 mg | ORAL_TABLET | Freq: Every day | ORAL | Status: DC
  Administered 2021-12-06: 40 mg via ORAL
  Filled 2021-12-05: qty 1

## 2021-12-05 MED ORDER — SODIUM CHLORIDE 0.9 % IV SOLN: Freq: Once | INTRAVENOUS | Status: AC

## 2021-12-05 MED ORDER — AMIODARONE HCL 200 MG PO TABS
200.0000 mg | ORAL_TABLET | Freq: Every day | ORAL | Status: DC
  Administered 2021-12-06: 200 mg via ORAL
  Filled 2021-12-05: qty 1

## 2021-12-05 MED ORDER — GADOBUTROL 1 MMOL/ML IV SOLN
6.0000 mL | Freq: Once | INTRAVENOUS | Status: AC | PRN
  Administered 2021-12-05: 6 mL via INTRAVENOUS

## 2021-12-05 MED ORDER — HYDROMORPHONE HCL 1 MG/ML IJ SOLN
1.0000 mg | Freq: Once | INTRAMUSCULAR | Status: AC
  Administered 2021-12-05: 1 mg via INTRAVENOUS
  Filled 2021-12-05: qty 1

## 2021-12-05 MED ORDER — KETOROLAC TROMETHAMINE 15 MG/ML IJ SOLN
15.0000 mg | Freq: Once | INTRAMUSCULAR | Status: AC
  Administered 2021-12-05: 15 mg via INTRAVENOUS
  Filled 2021-12-05: qty 1

## 2021-12-05 MED ORDER — ACETAMINOPHEN 325 MG PO TABS
650.0000 mg | ORAL_TABLET | Freq: Four times a day (QID) | ORAL | Status: DC | PRN
  Administered 2021-12-05: 650 mg via ORAL
  Filled 2021-12-05: qty 2

## 2021-12-05 MED ORDER — LORAZEPAM 0.5 MG PO TABS
0.5000 mg | ORAL_TABLET | Freq: Once | ORAL | Status: AC
  Administered 2021-12-05: 0.5 mg via ORAL
  Filled 2021-12-05: qty 1

## 2021-12-05 MED ORDER — HYDROMORPHONE HCL 1 MG/ML IJ SOLN
0.5000 mg | INTRAMUSCULAR | Status: DC | PRN
  Administered 2021-12-05: 0.5 mg via INTRAVENOUS
  Filled 2021-12-05: qty 1

## 2021-12-05 MED ORDER — HYDROMORPHONE HCL 1 MG/ML IJ SOLN
0.5000 mg | Freq: Once | INTRAMUSCULAR | Status: AC
  Administered 2021-12-05: 0.5 mg via INTRAVENOUS
  Filled 2021-12-05: qty 1

## 2021-12-05 MED ORDER — HYDROMORPHONE HCL 1 MG/ML IJ SOLN
0.5000 mg | INTRAMUSCULAR | Status: DC | PRN
  Administered 2021-12-05: 0.5 mg via INTRAVENOUS
  Filled 2021-12-05: qty 0.5

## 2021-12-05 MED ORDER — LORAZEPAM 2 MG/ML IJ SOLN
0.5000 mg | Freq: Once | INTRAMUSCULAR | Status: AC
  Administered 2021-12-05: 0.5 mg via INTRAVENOUS
  Filled 2021-12-05: qty 1

## 2021-12-05 MED ORDER — ONDANSETRON HCL 4 MG/2ML IJ SOLN
4.0000 mg | Freq: Once | INTRAMUSCULAR | Status: AC
  Administered 2021-12-05: 4 mg via INTRAVENOUS
  Filled 2021-12-05: qty 2

## 2021-12-05 MED ORDER — HYDROMORPHONE HCL 1 MG/ML IJ SOLN
0.5000 mg | Freq: Once | INTRAMUSCULAR | Status: AC
Start: 2021-12-05 — End: 2021-12-05
  Administered 2021-12-05: 0.5 mg via INTRAVENOUS
  Filled 2021-12-05: qty 1

## 2021-12-05 MED ORDER — PROCHLORPERAZINE EDISYLATE 10 MG/2ML IJ SOLN
5.0000 mg | Freq: Once | INTRAMUSCULAR | Status: AC
  Administered 2021-12-05: 5 mg via INTRAVENOUS
  Filled 2021-12-05: qty 2

## 2021-12-05 MED ORDER — APIXABAN 2.5 MG PO TABS
2.5000 mg | ORAL_TABLET | Freq: Two times a day (BID) | ORAL | Status: DC
  Administered 2021-12-06 (×2): 2.5 mg via ORAL
  Filled 2021-12-05 (×2): qty 1

## 2021-12-05 MED ORDER — PRAVASTATIN SODIUM 20 MG PO TABS
40.0000 mg | ORAL_TABLET | ORAL | Status: DC
  Administered 2021-12-06: 40 mg via ORAL
  Filled 2021-12-05: qty 2

## 2021-12-05 MED ORDER — DIAZEPAM 5 MG/ML IJ SOLN
2.5000 mg | Freq: Once | INTRAMUSCULAR | Status: AC
  Administered 2021-12-05: 2.5 mg via INTRAVENOUS
  Filled 2021-12-05: qty 2

## 2021-12-05 MED ORDER — ACETAMINOPHEN 650 MG RE SUPP: 650.0000 mg | Freq: Four times a day (QID) | RECTAL | Status: DC | PRN

## 2021-12-05 MED ORDER — ONDANSETRON HCL 4 MG/2ML IJ SOLN
4.0000 mg | Freq: Four times a day (QID) | INTRAMUSCULAR | Status: DC | PRN
  Administered 2021-12-05: 4 mg via INTRAVENOUS
  Filled 2021-12-05 (×2): qty 2

## 2021-12-05 MED ORDER — HYDRALAZINE HCL 20 MG/ML IJ SOLN
10.0000 mg | Freq: Once | INTRAMUSCULAR | Status: AC
  Administered 2021-12-05: 10 mg via INTRAVENOUS
  Filled 2021-12-05: qty 1

## 2021-12-05 MED ORDER — METHOCARBAMOL 1000 MG/10ML IJ SOLN
500.0000 mg | Freq: Four times a day (QID) | INTRAVENOUS | Status: DC | PRN
  Administered 2021-12-05: 500 mg via INTRAVENOUS
  Filled 2021-12-05: qty 5
  Filled 2021-12-05: qty 500

## 2021-12-05 NOTE — H&P (Signed)
History and Physical    Kayla Chambers ZJI:[id number] DOB: December 13, 1926 DOA: 12/04/2021  PCP: Donald Prose, MD   Patient coming from: Home .  I have personally briefly reviewed patient's old medical records in [place]  Chief Complaint: Back pain.  HPI: Kayla Chambers is a 88 y.o. 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  No fecal or urinary incontinence.  No fever, chills, sore throat, rhinorrhea, productive cough, dyspnea, wheezing or hemoptysis.  No chest pain, palpitations, diaphoresis, PND, orthopnea, but she gets frequent lower extremity edema.  She had 2 episodes of yesterday, but no diarrhea or abdominal pain.  No dysuria, frequency or hematuria.  No polyuria, polydipsia, polyphagia or blurred vision.  ED Course: Initial vital signs were temperature 99 F, pulse 63, respirations 16, BP 172/95 mmHg O2 sat 97% on room air.  The patient received ondansetron 4 mg IVP x2, hydromorphone 1 mg IVP, hydromorphone 0.5 mg IVP, diazepam 2.5 mg IVP and lorazepam 0.5 mg IVP.  Lab work: CBC showed a white count of 5.5, hemoglobin 12.6 g/dL platelets 134.  PT and INR were normal.  BMP was normal.  Imaging: CT thoracic and lumbar spine did not show any acute osseous abnormality.  However, there was multilevel chronic compression fraction T11, T12 and L3.  Bulky retropulsion of the L3 vertebral body  resulting in severe spinal stenosis at the level and contributes to moderate left L3 neural foraminal stenosis.  He developed mild new lower thoracic spinal stenosis related to retropulsion.  Base of the lower thoracic spinal cord and conus remain within normal limits.  There was a gastric hiatal hernia.  There was cardiomegaly  MRI of lumbar spine without contrast again showed multilevel chronic compression fractures..  Please see images and full radiology report for further details.  Review of Systems: As per HPI otherwise all other systems reviewed and are negative.  Past Medical History:  Diagnosis Date   Anxiety    takes xanax for sleep.    Aortic insufficiency    a. 03/2013 s/p  AVR w/ biologic 64mm Magna Ease peric tissue valve, model 300TFX, ser# [id number].    Back pain    BACK BRACE/FRACTURE   CAD (coronary artery disease)    a. 01/2013 mod calcification w/o sev dzs;  b. 03/2013 CABG x1 VG->RCA @ time of AVR   Chronic diastolic CHF (congestive heart failure) ([place])    a. 01/2013 echo: EF 50-55%.   Compression fracture of body of thoracic vertebra (HCC)    T-11   Dementia ([place])    Dilated aortic root ([place])    a. 03/2013 s/p [place] procedure with Ao Root replacement (29mm Valsalva graft) w/ reimplantationof the R and L coronary ostium.   Dysrhythmia    A. FIB   Fibromyalgia    Headache(784.0)    hx migraines   Hypothyroidism    Intracranial aneurysm    in late 1970's   Malignant neoplasm of connective and soft tissue ([place]) 01/08/2012   Overview:  11 cm, low grade, resection  with negative margins 12/30/09. Neoadjuvant RT by Dr. Yisroel Ramming. Surveillance plan: MRI 12/08/11 shows changes in presumed post-op seroma/hematoma with slight increase in size - follow up MRI 11/18/12 showed same size. Will continue with q3 month MRI. CT C/A/P annually alternating with CXR at 6 months.    Osteoporosis    Paroxysmal A-fib ([place])    a. Dx 01/2013 - placed on xarelto;  b. 03/2013 s/p left sided MAZE and LA  clip @ time of AVR/CABG.   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[place])    radiation & left lower extremity s/p resection in Feb 2012   Vertigo     Past Surgical History:  Procedure Laterality Date   ABDOMINAL HYSTERECTOMY     ASCENDING AORTIC ROOT REPLACEMENT N/A 03/31/2013   Procedure: ASCENDING AORTIC ROOT REPLACEMENT;  Surgeon: Grace Isaac, MD;  Location: [place];  Service: Open Heart Surgery;  Laterality: N/A; clip inserted.   BLADDER SUSPENSION     CARDIOVERSION N/A 01/29/2017   Procedure: CARDIOVERSION;  Surgeon: Jerline Pain, MD;  Location: [place];  Service: Cardiovascular;  Laterality: N/A;   CARDIOVERSION N/A 10/22/2017   Procedure: CARDIOVERSION;  Surgeon: Lelon Perla, MD;  Location: [facility] ENDOSCOPY;  Service: Cardiovascular;  Laterality: N/A;   COLONOSCOPY  10/25/2011   Procedure: COLONOSCOPY;  Surgeon: Garlan Fair, MD;  Location: [facility] ENDOSCOPY;  Service: Endoscopy;  Laterality: N/A;   EYE SURGERY Bilateral    cataracts   INTRAOPERATIVE TRANSESOPHAGEAL ECHOCARDIOGRAM N/A 03/31/2013   Procedure: INTRAOPERATIVE TRANSESOPHAGEAL ECHOCARDIOGRAM;  Surgeon: Grace Isaac, MD;  Location: [place];  Service: Open Heart Surgery;  Laterality: N/A;   KYPHOPLASTY N/A 05/07/2018   Procedure: KYPHOPLASTY LUMBAR THREE;  Surgeon: Melina Schools, MD;  Location: [place];  Service: Orthopedics;  Laterality: N/A;   KYPHOPLASTY N/A 05/14/2019   Procedure: KYPHOPLASTY T11;  Surgeon: Melina Schools, MD;  Location: [place];  Service: Orthopedics;  Laterality: N/A;  90 mins   KYPHOPLASTY N/A 07/08/2019   Procedure: KYPHOPLASTY THORACIC TWELVE;  Surgeon: Melina Schools, MD;  Location: [place];  Service: Orthopedics;  Laterality: N/A;  60 mins   LEFT AND RIGHT HEART CATHETERIZATION WITH CORONARY ANGIOGRAM Right 02/02/2013   Procedure: LEFT AND RIGHT HEART CATHETERIZATION WITH CORONARY ANGIOGRAM;  Surgeon: Hillary Bow, MD;   Location: [facility] CATH LAB;  Service: Cardiovascular;  Laterality: Right;   MAZE N/A 03/31/2013   Procedure: MAZE;  Surgeon: Grace Isaac, MD;  Location: [place];  Service: Open Heart Surgery;  Laterality: N/A;   Sarcoma resection  2011    Social History  reports that she has never smoked. She has never used smokeless tobacco. She reports that she does not drink alcohol and does not use drugs.  Allergies  Allergen Reactions   Alendronate Sodium Nausea And Vomiting   Ambien [Zolpidem Tartrate] Other (See Comments)    hallucinations   Lac Bovis Nausea Only    Drinks soy milk   Lyrica [Pregabalin] Nausea And Vomiting   Milk-Related Compounds Diarrhea    Pt can have foods that have milk in it, she just can't drink milk   Neurontin [Gabapentin] Nausea And Vomiting   Procaine Hcl Other (See Comments)    Reaction unknown   Toprol Xl [Metoprolol Succinate] Other (See Comments)    Does not remember. Tolerates Lopressor 04/10/13, [name]    Family History  Problem Relation Age of Onset  Heart disease Mother    Stroke Mother    Arthritis Father    Cancer Father    Anesthesia problems Brother    Hypertension Sister    Prior to Admission medications   Medication Sig Start Date End Date Taking? Authorizing Provider  acetaminophen (TYLENOL) 500 MG tablet Take 1 tablet (500 mg total) by mouth every 6 (six) hours as needed for mild pain (pain.). 08/02/20   Rai, Vernelle Emerald, MD  ALPRAZolam Duanne Moron) 0.5 MG tablet Take 0.5 mg by mouth at bedtime. 07/05/20   [provider]  amiodarone (PACERONE) 200 MG tablet TAKE 1 TABLET BY MOUTH DAILY 08/21/21   Lelon Perla, MD  ELIQUIS 2.5 MG TABS tablet TAKE 1 TABLET TWICE A DAY 09/13/21   Lelon Perla, MD  furosemide (LASIX) 40 MG tablet Take 1 tablet daily and can take extra 1 tablet as needed for swelling Patient taking differently: Take 20 mg by mouth daily. 05/24/20   Lelon Perla, MD  lactase (LACTAID) 3000 UNITS tablet Take 1 tablet  by mouth daily as needed (only when eating dairy).    [provider]  levothyroxine (SYNTHROID, LEVOTHROID) 75 MCG tablet Take 75 mcg by mouth at bedtime.    [provider]  Multiple Vitamin (MULTIVITAMIN WITH MINERALS) TABS tablet Take 1 tablet by mouth daily.    [provider]  OVER THE COUNTER MEDICATION Take 1 tablet by mouth with breakfast, with lunch, and with evening meal. Golo supplement    [provider]  OVER THE COUNTER MEDICATION Take 1 tablet by mouth daily. prevagin for memory OTC    [provider]  potassium chloride SA (KLOR-CON M) 20 MEQ tablet TAKE 1 TABLET(20 MEQ) BY MOUTH DAILY 11/17/21   Lelon Perla, MD  pravastatin (PRAVACHOL) 40 MG tablet TAKE 1 TABLET(40 MG) BY MOUTH EVERY EVENING 09/18/21   Lelon Perla, MD  rOPINIRole (REQUIP) 0.25 MG tablet Take 0.25 mg by mouth at bedtime.    [provider]    Physical Exam: Vitals:   12/05/21 0801 12/05/21 0900 12/05/21 0930 12/05/21 1000  BP: (!) 150/83 (!) 164/87 (!) 174/78 (!) 179/91  Pulse: 65 63 (!) 59 63  Resp: 16 14 13 17   Temp:      TempSrc:      SpO2: 100% 98% 99% 99%    Constitutional: NAD, calm, comfortable Eyes: PERRL, lids and conjunctivae normal ENMT: Mucous membranes are moist. Posterior pharynx clear of any exudate or lesions. Neck: normal, supple, no masses, no thyromegaly Respiratory: clear to auscultation bilaterally, no wheezing, no crackles. Normal respiratory effort. No accessory muscle use.  Cardiovascular: Regular rate and rhythm, no murmurs / rubs / gallops. No extremity edema. 2+ pedal pulses. No carotid bruits.  Abdomen: No distention.  Soft, no tenderness, no masses palpated. No hepatosplenomegaly. Bowel sounds positive.  Musculoskeletal: no clubbing / cyanosis.  Mild generalized weakness.  Paraspinal muscle tenderness Dr. Wilma Flavin examination earlier.  Did not reevaluate due to alcoholic discomfort.  Impaired ROM of thoracic  and lumbar vertebral spine, no contractures. Normal muscle tone.  Skin: Small areas of ecchymosis on extremities. Neurologic: CN 2-12 grossly intact. Sensation intact, DTR normal. Strength 5/5 in all 4.  Psychiatric: Normal judgment and insight. Alert and oriented x 3. Normal mood.   Labs on Admission: I have personally reviewed following labs and imaging studies  CBC: Recent Labs  Lab 12/05/21 0215  WBC 5.5  NEUTROABS 4.1  HGB 12.6  HCT 38.3  MCV 100.5*  PLT 134*    Basic Metabolic Panel: Recent Labs  Lab 12/05/21 0215  NA 135  K 3.8  CL 100  CO2 25  GLUCOSE 98  BUN 10  CREATININE 0.67  CALCIUM 9.0    GFR: CrCl cannot be calculated (Unknown ideal weight.).  Liver Function Tests: No results for input(s): AST, ALT, ALKPHOS, BILITOT, PROT, ALBUMIN in the last 168 hours.  Radiological Exams on Admission: CT Thoracic Spine Wo Contrast  Result Date: 12/04/2021 CLINICAL DATA:  Worsening back pain EXAM: CT THORACIC SPINE WITHOUT CONTRAST TECHNIQUE: Multidetector CT images of the thoracic were obtained using the standard protocol without intravenous contrast. RADIATION DOSE REDUCTION: This exam was performed according to the departmental dose-optimization program which includes automated exposure control, adjustment of the mA and/or kV according to patient size and/or use of iterative reconstruction technique. COMPARISON:  Chest x-ray 10/16/2021, thoracic radiograph 12/13/2018 FINDINGS: Alignment: Normal. Vertebrae: Post augmentation changes at T11 and T12. Age indeterminate mild superior endplate deformity at N17. Remaining vertebra demonstrate normal stature. Paraspinal and other soft tissues: Cardiomegaly with marked right atrial enlargement. Aortic valve prosthesis. Cardiomegaly. Dilated ascending aorta measuring up to 4.2 cm. Disc levels: Disc spaces are generally patent. Minimal degenerative osteophytes at T9-T10. Flowing osteophytes at T11-T12 and T12-L1. IMPRESSION: 1. Mild  age indeterminate superior endplate deformity at G01. 2. Treated compression fractures at T11 and T12 3. Cardiomegaly with marked right atrial enlargement 4. Dilated ascending aorta measuring up to 4.2 cm. Recommend annual imaging followup by CTA or MRA. This recommendation follows 2010 ACCF/AHA/AATS/ACR/ASA/SCA/SCAI/SIR/STS/SVM Guidelines for the Diagnosis and Management of Patients with Thoracic Aortic Disease. Circulation. 2010; 121: V494-W967. Aortic aneurysm NOS (ICD10-I71.9) Electronically Signed   By: Donavan Foil M.D.   On: 12/04/2021 20:38   CT Lumbar Spine Wo Contrast  Result Date: 12/04/2021 CLINICAL DATA:  Back pain EXAM: CT LUMBAR SPINE WITHOUT CONTRAST TECHNIQUE: Multidetector CT imaging of the lumbar spine was performed without intravenous contrast administration. Multiplanar CT image reconstructions were also generated. RADIATION DOSE REDUCTION: This exam was performed according to the departmental dose-optimization program which includes automated exposure control, adjustment of the mA and/or kV according to patient size and/or use of iterative reconstruction technique. COMPARISON:  CT 10/22/2019, 07/31/2020 FINDINGS: Segmentation: 5 lumbar type vertebrae. Alignment: Mild scoliosis. Vertebrae: Incompletely visualized treated compression fracture at T12. Chronic compression fracture at L1, and L4. Severe compression fracture at L3 post augmentation. No acute fracture is seen. Old transverse process fractures on the left at L2 and on the right at L3. Paraspinal and other soft tissues: Aortic atherosclerosis. No paravertebral or paraspinal soft tissue abnormality is visualized. Disc levels: At T12-L1, no significant canal stenosis.  The foramen are patent. At L1-L2, vacuum discs. No high-grade canal stenosis. Mild facet degenerative changes. The foramen are patent bilaterally. At L2-L3, severe canal stenosis, secondary to retropulsion of chronic compression fracture. Moderate facet degenerative  changes bilaterally. At L3 L4, diffuse disc bulge. Mild canal stenosis. Moderate facet degenerative changes. At L4-L5, vacuum discs. Moderate diffuse disc bulge. Moderate canal stenosis. Moderate facet hypertrophic degenerative change and ligamentum flavum thickening. Left greater than right foraminal narrowing. At L5-S1, maintained disc space. No canal stenosis. Moderate bilateral facet degenerative changes. The foramen are grossly patent. IMPRESSION: 1. Chronic compression fractures at L1 and L4. 2. Severe chronic treated compression fracture at L3. Severe canal stenosis at L3 secondary to retropulsion of chronic fracture fragments. 3. No definite acute osseous abnormality is seen Electronically Signed   By: Maudie Mercury  Francoise Ceo M.D.   On: 12/04/2021 20:47   MR Lumbar Spine W Wo Contrast  Result Date: 12/05/2021 CLINICAL DATA:  88 year old female with back pain. Lumbar compression fractures. EXAM: MRI LUMBAR SPINE WITHOUT AND WITH CONTRAST TECHNIQUE: Multiplanar and multiecho pulse sequences of the lumbar spine were obtained without and with intravenous contrast. CONTRAST:  27mL GADAVIST GADOBUTROL 1 MMOL/ML IV SOLN COMPARISON:  CT thoracic and lumbar spine yesterday. Lumbar MRI 04/09/2018. FINDINGS: Segmentation: Normal on the CT yesterday, which is the same numbering system used on the 2019 MRI. Alignment: Overall lumbar lordosis not significantly changed from 2019. Vertebrae: Previously augmented compression fractures of T11, T12, L3. The L3 compression was acute or subacute in 2019, now with severe vertebral loss of height and bulky retropulsion as demonstrated by CT. Chronic un treated L1 and L4 inferior endplate compression with superimposed Schmorl's node. Mild T10 compression also suspected but appears to be chronic. L5 appears stable and intact. No convincing marrow edema or acute osseous abnormality. Intact visible sacrum and SI joints. Conus medullaris and cauda equina: Conus extends to the T12-L1 level. No  lower spinal cord or conus signal abnormality. No abnormal intradural enhancement. No dural thickening identified. Paraspinal and other soft tissues: Partially visible gastric hiatal hernia is moderate to large. Negative visible abdominal viscera. Lumbar paraspinal soft tissues remain within normal limits. Disc levels: T10-T11: Mild spinal stenosis primarily due to mild retropulsion of T11. T11-T12: Mild retropulsion but no significant stenosis. T12-L1: Mild spinal stenosis is new since 2019 related to retropulsion mostly at the L1 level. L1-L2:  Negative for age. L2-L3 and L3-L4: Severe spinal stenosis at the L3 level related to bulky retropulsion (series 13, image 9 and series 11, image 31. And moderate associated left L3 neural foraminal stenosis. L4-L5: Mild chronic spinal stenosis in part related to L4 posteroinferior endplate retropulsion has not significantly changed since 2019 along with moderate to severe L4 foraminal stenosis greater on the left. L5-S1: Stable and negative for age. Right S1 chronic nerve root diverticulum. IMPRESSION: 1. No acute osseous abnormality in the lumbar spine. Multilevel chronic compression fractures, previously augmented at T11, T12, and L3. Bulky retropulsion of the L3 vertebral body results in severe spinal stenosis at that level and contributes to moderate left L3 neural foraminal stenosis. 2. Mild chronic spinal stenosis with moderate to severe left greater than right foraminal stenosis at L4-L5 is in part due to L4 retropulsion and has not significantly changed since 2019. 3. Mild new lower thoracic spinal stenosis related to retropulsion. Visible lower thoracic spinal cord and conus remain within normal limits. 4. Gastric hiatal hernia. Electronically Signed   By: Genevie Ann M.D.   On: 12/05/2021 08:23    EKG: Independently reviewed.  Assessment/Plan Principal Problem:   Intractable low back pain In the setting of history of multiple   Compression fracture of  vertebrae T11, T12 and L3 (HCC)   S/P kyphoplasty Observation/telemetry. Analgesics as needed. Muscle relaxant as needed. PT evaluation in AM. Consult TOC team.  Active Problems:   Hypothyroidism Continue levothyroxine 75 mcg p.o. daily.    Peripheral neuropathy Analgesics as needed.    PAF (paroxysmal atrial fibrillation) (HCC) CHA?DS?-VASc Score of at least 5. Continue apixaban 2.5 mg p.o. twice daily. Continue amiodarone 200 mg p.o. daily.    HTN (hypertension) Only taking furosemide at home. Continue furosemide 40 mg p.o. daily. Continue KCl 20 mEq p.o. daily. As needed hydralazine as needed while in the hospital. Monitor and replace electrolytes as needed.  CAD (coronary artery disease) On apixaban and pravastatin.    Hyperlipidemia/aortic atherosclerosis. Continue with pravastatin 40 mg p.o. daily.    Anxiety At home, she is on alprazolam as needed. Receivied IV benzodiazepines earlier for muscle spasm.    Macrocytosis Monitor MCV/H&H.    Thrombocytopenia (HCC) Mild. Monitor platelet count.   DVT prophylaxis: Lovenox SQ. Code Status:   Full code. Family Communication:  Her daughter-in-law was present in the ED room. Disposition Plan:   Patient is from:  Home.  Anticipated DC to:  Home.  Anticipated DC date:  12/06/2021.  Anticipated DC barriers: Clinical status.  Consults called:   Admission status:  Observation/telemetry.  Severity of Illness: High severity in the setting of intractable back pain despite multiple doses of analgesics and muscle relaxants.  Reubin Milan MD [facility]  How to contact the [facility] Attending or Consulting provider [place] or covering provider during after hours [place], for this patient?   Check the care team in [facility] and look for a) attending/consulting [facility] provider listed and b) the [facility] team listed Log into [email or web address] and use [facility]'s universal password to access. If you do not have the password,  please contact the hospital operator. Locate the [facility] provider you are looking for under [facility] and page to a number that you can be directly reached. If you still have difficulty reaching the provider, please page the [facility] (Director on Call) for the Hospitalists listed on amion for assistance.  12/05/2021, 10:26 AM   This document was prepared using Dragon voice recognition software and may contain some unintended transcription errors.

## 2021-12-05 NOTE — ED Provider Notes (Signed)
[facility] DEPT Provider Note  CSN: [id number] Arrival date & time: 12/04/21 1931  Chief Complaint(s) Back Pain  HPI Kayla Chambers is a 88 y.o. female with a past medical history listed below including chronic compression fractures in thoracic and lumbar spine who presents to the emergency department with 3 days of gradually worsening lower back pain.  The pain has not been controlled with home medication.  She denies any bladder/bowel incontinence or lower extremity weakness.  Does report having bilateral radiculopathy to lower extremities.  No fevers or chills.  No falls or trauma.  Family believes negative pain with her back Motrin and get out of the couch.   Back Pain  Past Medical History Past Medical History:  Diagnosis Date   Anxiety    takes xanax for sleep.    Aortic insufficiency    a. 03/2013 s/p  AVR w/ biologic 58mm Magna Ease peric tissue valve, model 300TFX, ser# [id number].    Back pain    BACK BRACE/FRACTURE   CAD (coronary artery disease)    a. 01/2013 mod calcification w/o sev dzs;  b. 03/2013 CABG x1 VG->RCA @ time of AVR   Chronic diastolic CHF (congestive heart failure) ([place])    a. 01/2013 echo: EF 50-55%.   Compression fracture of body of thoracic vertebra (HCC)    T-11   Dementia ([place])    Dilated aortic root ([place])    a. 03/2013 s/p Bentall procedure with Ao Root replacement (81mm Valsalva graft) w/ reimplantationof the R and L coronary ostium.   Dysrhythmia    A. 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 Neoadjuvant RT by Dr. Yisroel Ramming. Surveillance plan: MRI 12/08/11 shows changes in presumed post-op seroma/hematoma with slight increase in size - follow up MRI 11/18/12 showed same size. Will continue with q3 month MRI.  CT C/A/P annually alternating with CXR at 6 months.    Osteoporosis    Paroxysmal A-fib ([place])    a. Dx 01/2013 - placed on xarelto;  b. 03/2013 s/p left sided MAZE and LA clip @ time of AVR/CABG.   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[place])    radiation & left lower extremity s/p resection in Feb 2012   Vertigo    Patient Active Problem List   Diagnosis Date Noted   Abdominal pain 07/31/2020   Thoracic compression fracture ([place]) 05/14/2019   S/P kyphoplasty 05/07/2018   Vertebral fracture, osteoporotic, initial encounter ([place]) 04/09/2018   Compression fracture of vertebrae ([place]) 04/09/2018   Rheumatic heart disease    PMR (polymyalgia rheumatica) (HCC)    Paroxysmal A-fib (HCC)    Intracranial aneurysm    Fibromyalgia    Anxiety    H/O aortic valve replacement 02/02/2016   Cerebrovascular disease 02/02/2016   Hyperlipidemia 02/02/2016   Vertigo 01/07/2015   Bronchitis 12/02/2013   Malaise and fatigue 05/26/2013   PAF (paroxysmal atrial fibrillation) ([place]) 04/08/2013   HTN (hypertension) 04/08/2013   Chronic diastolic CHF (congestive heart failure) ([place]) 04/08/2013   CAD (coronary artery disease) 04/08/2013   Acute on chronic diastolic heart failure ([place]) 01/31/2013   Disorder of liver 10/07/2012   Solitary  pulmonary nodule 10/07/2012   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 Authorizing Provider  acetaminophen (TYLENOL) 500 MG tablet Take 1 tablet (500 mg total) by mouth every 6 (six) hours as needed for mild pain  (pain.). 08/02/20   Rai, Vernelle Emerald, MD  ALPRAZolam Duanne Moron) 0.5 MG tablet Take 0.5 mg by mouth at bedtime. 07/05/20   [provider]  amiodarone (PACERONE) 200 MG tablet TAKE 1 TABLET BY MOUTH DAILY 08/21/21   Lelon Perla, MD  ELIQUIS 2.5 MG TABS tablet TAKE 1 TABLET TWICE A DAY 09/13/21   Lelon Perla, MD  furosemide (LASIX) 40 MG tablet Take 1 tablet daily and can take extra 1 tablet as needed for swelling Patient taking differently: Take 20 mg by mouth daily. 05/24/20   Lelon Perla, MD  lactase (LACTAID) 3000 UNITS tablet Take 1 tablet by mouth daily as needed (only when eating dairy).    [provider]  levothyroxine (SYNTHROID, LEVOTHROID) 75 MCG tablet Take 75 mcg by mouth at bedtime.    [provider]  Multiple Vitamin (MULTIVITAMIN WITH MINERALS) TABS tablet Take 1 tablet by mouth daily.    [provider]  OVER THE COUNTER MEDICATION Take 1 tablet by mouth with breakfast, with lunch, and with evening meal. Golo supplement    [provider]  OVER THE COUNTER MEDICATION Take 1 tablet by mouth daily. prevagin for memory OTC    [provider]  potassium chloride SA (KLOR-CON M) 20 MEQ tablet TAKE 1 TABLET(20 MEQ) BY MOUTH DAILY 11/17/21   Lelon Perla, MD  pravastatin (PRAVACHOL) 40 MG tablet TAKE 1 TABLET(40 MG) BY MOUTH EVERY EVENING 09/18/21   Lelon Perla, MD  rOPINIRole (REQUIP) 0.25 MG tablet Take 0.25 mg by mouth at bedtime.    [provider]                                                                                                                                    Allergies Alendronate sodium, Ambien [zolpidem tartrate], Lac bovis, Lyrica [pregabalin], Milk-related compounds, Neurontin [gabapentin], Procaine hcl, and Toprol xl [metoprolol succinate]  Review of Systems Review of Systems  Musculoskeletal:  Positive for back pain.  As noted in HPI  Physical Exam Vital Signs  I have  reviewed the triage vital signs BP (!) 162/67    Pulse 61    Temp 99 F (37.2 C) (Oral)    Resp 15    LMP  (LMP Unknown)    SpO2 97%   Physical Exam Vitals reviewed.  Constitutional:      General: She is not in acute distress.    Appearance: She is well-developed. She is not diaphoretic.  HENT:     Head: Normocephalic and atraumatic.     Right Ear: External ear normal.     Left Ear: External ear normal.     Nose: Nose normal.  Eyes:     General: No scleral icterus.    Conjunctiva/sclera: Conjunctivae normal.  Neck:     Trachea: Phonation normal.  Cardiovascular:     Rate and Rhythm: Normal rate and regular rhythm.  Pulmonary:     Effort: Pulmonary effort is normal. No respiratory distress.     Breath sounds: No stridor.  Abdominal:     General: There is no distension.     Tenderness: There is no abdominal tenderness.  Musculoskeletal:        General: Normal range of motion.     Cervical back: Normal range of motion.     Lumbar back: Tenderness present.       Back:  Neurological:     Mental Status: She is alert and oriented to person, place, and time.     Comments: Spine Exam: Strength: 4/5 throughout LE bilaterally, but this is limited by back pain Sensation: Intact to light touch in proximal and distal LE bilaterally Reflexes: no clonus   Psychiatric:        Behavior: Behavior normal.    ED Results and Treatments Labs (all labs ordered are listed, but only abnormal results are displayed) Labs Reviewed  CBC WITH DIFFERENTIAL/PLATELET - Abnormal; Notable for the following components:      Result Value   RBC 3.81 (*)    MCV 100.5 (*)    Platelets 134 (*)    All other components within normal limits  BASIC METABOLIC PANEL  PROTIME-INR                                                                                                                         EKG  EKG Interpretation  Date/Time:    Ventricular Rate:    PR Interval:    QRS Duration:   QT  Interval:    QTC Calculation:   R Axis:     Text Interpretation:         Radiology CT Thoracic Spine Wo Contrast  Result Date: 12/04/2021 CLINICAL DATA:  Worsening back pain EXAM: CT THORACIC SPINE WITHOUT CONTRAST TECHNIQUE: Multidetector CT images of the thoracic were obtained using the standard protocol without intravenous contrast. RADIATION DOSE REDUCTION: This exam was performed according to the departmental dose-optimization program which includes automated exposure control, adjustment of the mA and/or kV according to patient size and/or use of iterative reconstruction technique. COMPARISON:  Chest x-ray 10/16/2021, thoracic radiograph 12/13/2018 FINDINGS: Alignment: Normal. Vertebrae: Post augmentation changes at T11 and T12. Age indeterminate mild superior endplate deformity at M19. Remaining vertebra demonstrate normal stature. Paraspinal and other soft tissues: Cardiomegaly with marked right atrial enlargement. Aortic  valve prosthesis. Cardiomegaly. Dilated ascending aorta measuring up to 4.2 cm. Disc levels: Disc spaces are generally patent. Minimal degenerative osteophytes at T9-T10. Flowing osteophytes at T11-T12 and T12-L1. IMPRESSION: 1. Mild age indeterminate superior endplate deformity at P53. 2. Treated compression fractures at T11 and T12 3. Cardiomegaly with marked right atrial enlargement 4. Dilated ascending aorta measuring up to 4.2 cm. Recommend annual imaging followup by CTA or MRA. This recommendation follows 2010 ACCF/AHA/AATS/ACR/ASA/SCA/SCAI/SIR/STS/SVM Guidelines for the Diagnosis and Management of Patients with Thoracic Aortic Disease. Circulation. 2010; 121: Z482-L078. Aortic aneurysm NOS (ICD10-I71.9) Electronically Signed   By: Donavan Foil M.D.   On: 12/04/2021 20:38   CT Lumbar Spine Wo Contrast  Result Date: 12/04/2021 CLINICAL DATA:  Back pain EXAM: CT LUMBAR SPINE WITHOUT CONTRAST TECHNIQUE: Multidetector CT imaging of the lumbar spine was performed without  intravenous contrast administration. Multiplanar CT image reconstructions were also generated. RADIATION DOSE REDUCTION: This exam was performed according to the departmental dose-optimization program which includes automated exposure control, adjustment of the mA and/or kV according to patient size and/or use of iterative reconstruction technique. COMPARISON:  CT 10/22/2019, 07/31/2020 FINDINGS: Segmentation: 5 lumbar type vertebrae. Alignment: Mild scoliosis. Vertebrae: Incompletely visualized treated compression fracture at T12. Chronic compression fracture at L1, and L4. Severe compression fracture at L3 post augmentation. No acute fracture is seen. Old transverse process fractures on the left at L2 and on the right at L3. Paraspinal and other soft tissues: Aortic atherosclerosis. No paravertebral or paraspinal soft tissue abnormality is visualized. Disc levels: At T12-L1, no significant canal stenosis.  The foramen are patent. At L1-L2, vacuum discs. No high-grade canal stenosis. Mild facet degenerative changes. The foramen are patent bilaterally. At L2-L3, severe canal stenosis, secondary to retropulsion of chronic compression fracture. Moderate facet degenerative changes bilaterally. At L3 L4, diffuse disc bulge. Mild canal stenosis. Moderate facet degenerative changes. At L4-L5, vacuum discs. Moderate diffuse disc bulge. Moderate canal stenosis. Moderate facet hypertrophic degenerative change and ligamentum flavum thickening. Left greater than right foraminal narrowing. At L5-S1, maintained disc space. No canal stenosis. Moderate bilateral facet degenerative changes. The foramen are grossly patent. IMPRESSION: 1. Chronic compression fractures at L1 and L4. 2. Severe chronic treated compression fracture at L3. Severe canal stenosis at L3 secondary to retropulsion of chronic fracture fragments. 3. No definite acute osseous abnormality is seen Electronically Signed   By: Donavan Foil M.D.   On: 12/04/2021  20:47    Pertinent labs & imaging results that were available during my care of the patient were reviewed by me and considered in my medical decision making (see MDM for details).  Medications Ordered in ED Medications  0.9 %  sodium chloride infusion ( Intravenous New Bag/Given 12/05/21 0224)  HYDROmorphone (DILAUDID) injection 1 mg (1 mg Intravenous Given 12/05/21 0217)  diazepam (VALIUM) injection 2.5 mg (2.5 mg Intravenous Given 12/05/21 0218)  ondansetron (ZOFRAN) injection 4 mg (4 mg Intravenous Given 12/05/21 0354)  Procedures Procedures  (including critical care time)  Medical Decision Making / ED Course        Lower back pain with bilateral lower extremity radiculopathy No bladder/bowel incontinence, or significant lower extremity weakness noted on exam The patient seen in the MSE process and had CT of the thoracic and lumbar spine ordered. Will order screening labs. Will need to assess for any evidence of severe spinal canal stenosis   Labs and imaging independently interpreted by me and noted below: CBC without leukocytosis or anemia No significant electrolyte derangements or renal sufficiency CT of the thoracic and lumbar spine most notable for severely compressed L3 fracture with retropulsion of fracture fragments intruding into the spinal canal.  Similar but might be slightly worse than previous.   Management: Patient provided with IV fluids, IV pain medicine and IV Valium for muscle relaxer. Will order MRI to evaluate for any severe canal stenosis contributing to the bilateral lower extremity radiculopathy.  Reassessment: Pain did improve.  Patient care turned over to oncoming provider. Patient case and results discussed in detail; please see their note for further ED managment.     Final Clinical Impression(s) / ED Diagnoses Final  diagnoses:  None           This chart was dictated using voice recognition software.  Despite best efforts to proofread,  errors can occur which can change the documentation meaning.    Fatima Blank, MD 12/05/21 [phone]

## 2021-12-05 NOTE — ED Provider Notes (Signed)
Patient signed to me by Dr. Leonette Monarch pending results of MRI which did not demonstrate any acute new abnormalities in the lumbar spine.  Does have known stenosis.  Has medicated multiple times for pain at this time.  Still remains uncomfortable and require admission for pain management   Lacretia Leigh, MD 12/05/21 1006

## 2021-12-05 NOTE — ED Notes (Signed)
Patient transported to MRI 

## 2021-12-05 NOTE — ED Notes (Signed)
Lunch tray provided. 

## 2021-12-06 DIAGNOSIS — M545 Low back pain, unspecified: Secondary | ICD-10-CM | POA: Diagnosis present

## 2021-12-06 DIAGNOSIS — I5032 Chronic diastolic (congestive) heart failure: Secondary | ICD-10-CM | POA: Diagnosis present

## 2021-12-06 DIAGNOSIS — M5459 Other low back pain: Secondary | ICD-10-CM | POA: Diagnosis not present

## 2021-12-06 DIAGNOSIS — M48061 Spinal stenosis, lumbar region without neurogenic claudication: Principal | ICD-10-CM

## 2021-12-06 DIAGNOSIS — I2510 Atherosclerotic heart disease of native coronary artery without angina pectoris: Secondary | ICD-10-CM | POA: Diagnosis present

## 2021-12-06 DIAGNOSIS — Z90710 Acquired absence of both cervix and uterus: Secondary | ICD-10-CM | POA: Diagnosis not present

## 2021-12-06 DIAGNOSIS — D7589 Other specified diseases of blood and blood-forming organs: Secondary | ICD-10-CM | POA: Diagnosis present

## 2021-12-06 DIAGNOSIS — E785 Hyperlipidemia, unspecified: Secondary | ICD-10-CM | POA: Diagnosis present

## 2021-12-06 DIAGNOSIS — M353 Polymyalgia rheumatica: Secondary | ICD-10-CM | POA: Diagnosis present

## 2021-12-06 DIAGNOSIS — Z888 Allergy status to other drugs, medicaments and biological substances status: Secondary | ICD-10-CM | POA: Diagnosis not present

## 2021-12-06 DIAGNOSIS — M8008XA Age-related osteoporosis with current pathological fracture, vertebra(e), initial encounter for fracture: Secondary | ICD-10-CM | POA: Diagnosis present

## 2021-12-06 DIAGNOSIS — I77810 Thoracic aortic ectasia: Secondary | ICD-10-CM | POA: Diagnosis present

## 2021-12-06 DIAGNOSIS — M5416 Radiculopathy, lumbar region: Secondary | ICD-10-CM | POA: Diagnosis present

## 2021-12-06 DIAGNOSIS — I480 Paroxysmal atrial fibrillation: Secondary | ICD-10-CM | POA: Diagnosis present

## 2021-12-06 DIAGNOSIS — Z8249 Family history of ischemic heart disease and other diseases of the circulatory system: Secondary | ICD-10-CM | POA: Diagnosis not present

## 2021-12-06 DIAGNOSIS — Z85831 Personal history of malignant neoplasm of soft tissue: Secondary | ICD-10-CM | POA: Diagnosis not present

## 2021-12-06 DIAGNOSIS — G629 Polyneuropathy, unspecified: Secondary | ICD-10-CM | POA: Diagnosis present

## 2021-12-06 DIAGNOSIS — G8929 Other chronic pain: Secondary | ICD-10-CM | POA: Diagnosis present

## 2021-12-06 DIAGNOSIS — Z951 Presence of aortocoronary bypass graft: Secondary | ICD-10-CM | POA: Diagnosis not present

## 2021-12-06 DIAGNOSIS — F0394 Unspecified dementia, unspecified severity, with anxiety: Secondary | ICD-10-CM | POA: Diagnosis present

## 2021-12-06 DIAGNOSIS — E039 Hypothyroidism, unspecified: Secondary | ICD-10-CM | POA: Diagnosis present

## 2021-12-06 DIAGNOSIS — M797 Fibromyalgia: Secondary | ICD-10-CM | POA: Diagnosis present

## 2021-12-06 DIAGNOSIS — I700 Atherosclerosis of aorta: Secondary | ICD-10-CM | POA: Diagnosis present

## 2021-12-06 DIAGNOSIS — Z20822 Contact with and (suspected) exposure to covid-19: Secondary | ICD-10-CM | POA: Diagnosis present

## 2021-12-06 DIAGNOSIS — D696 Thrombocytopenia, unspecified: Secondary | ICD-10-CM | POA: Diagnosis present

## 2021-12-06 DIAGNOSIS — I110 Hypertensive heart disease with heart failure: Secondary | ICD-10-CM | POA: Diagnosis present

## 2021-12-06 MED ORDER — IBUPROFEN 400 MG PO TABS: 400.0000 mg | ORAL_TABLET | Freq: Four times a day (QID) | ORAL | 0 refills | Status: AC | PRN

## 2021-12-06 MED ORDER — ACETAMINOPHEN 650 MG RE SUPP: 650.0000 mg | Freq: Four times a day (QID) | RECTAL | Status: DC | PRN

## 2021-12-06 MED ORDER — ACETAMINOPHEN 325 MG PO TABS: 650.0000 mg | ORAL_TABLET | ORAL | Status: DC | PRN

## 2021-12-06 MED ORDER — OXYCODONE HCL 5 MG PO TABS: 10.0000 mg | ORAL_TABLET | ORAL | Status: DC | PRN

## 2021-12-06 MED ORDER — KETOROLAC TROMETHAMINE 15 MG/ML IJ SOLN
15.0000 mg | Freq: Four times a day (QID) | INTRAMUSCULAR | Status: DC
  Administered 2021-12-06: 15 mg via INTRAVENOUS
  Filled 2021-12-06: qty 1

## 2021-12-06 NOTE — TOC Initial Note (Signed)
Transition of Care [facility]) - Initial/Assessment Note    Patient Details  Name: Kayla Chambers MRN: [id number] Date of Birth: 10-10-1927  Transition of Care [facility]) CM/SW Contact:    Lynnell Catalan, RN Phone Number: 12/06/2021, 3:44 PM  Clinical Narrative:                 Physical therapy recommendation for HHPT reviewed with daughter in law and pt. Choice offered for home health services and [facility] chosen. [facility] liaison contacted for referral.  Expected Discharge Plan: [place] Barriers to Discharge: No Barriers Identified   Patient Goals and CMS Choice Patient states their goals for this hospitalization and ongoing recovery are:: To go home CMS Medicare.gov Compare Post Acute Care list provided to:: Patient Choice offered to / list presented to : Patient  Expected Discharge Plan and Services Expected Discharge Plan: [employer]   Discharge Planning Services: CM Consult Post Acute Care Choice: [place] arrangements for the past 2 months: Single Family Home Expected Discharge Date: 12/06/21                         HH Arranged: PT HH Agency: [facility] Date [place]: 12/06/21 Time HH Agency Contacted: [id number] Representative spoke with at [place]: Tommi Rumps  Prior Living Arrangements/Services Living arrangements for the past 2 months: [place] Lives with:: Spouse Patient language and need for interpreter reviewed:: Yes Do you feel safe going back to the place where you live?: Yes      Need for Family Participation in Patient Care: Yes (Comment) Care giver support system in place?: Yes (comment)   Criminal Activity/Legal Involvement Pertinent to Current Situation/Hospitalization: No - Comment as needed  Activities of Daily Living Home Assistive Devices/Equipment: Gilford Rile (specify type) ADL Screening (condition at time of admission) Patient's cognitive ability adequate to safely complete daily  activities?: Yes Is the patient deaf or have difficulty hearing?: No Does the patient have difficulty seeing, even when wearing glasses/contacts?: Yes Does the patient have difficulty concentrating, remembering, or making decisions?: No Patient able to express need for assistance with ADLs?: Yes Does the patient have difficulty dressing or bathing?: Yes Independently performs ADLs?: Yes (appropriate for developmental age) Does the patient have difficulty walking or climbing stairs?: Yes Weakness of Legs: Both Weakness of Arms/Hands: Both  Permission Sought/Granted Permission sought to share information with : Facility Art therapist granted to share information with : Yes, Verbal Permission Granted     Permission granted to share info w AGENCY: [facility]        Emotional Assessment   Attitude/Demeanor/Rapport: Gracious Affect (typically observed): Calm   Alcohol / Substance Use: Not Applicable Psych Involvement: No (comment)  Admission diagnosis:  Intractable low back pain [M54.59] Low back pain [M54.50] Patient Active Problem List   Diagnosis Date Noted   Low back pain 12/06/2021   Intractable low back pain 12/05/2021   Macrocytosis 12/05/2021   Thrombocytopenia ([place]) 12/05/2021   Aortic atherosclerosis ([place]) 12/05/2021   Abdominal pain 07/31/2020   Thoracic compression fracture ([place]) 05/14/2019   S/P kyphoplasty 05/07/2018   Vertebral fracture, osteoporotic, initial encounter ([place]) 04/09/2018   Compression fracture of vertebrae ([place]) 04/09/2018   Rheumatic heart disease    PMR (polymyalgia rheumatica) (HCC)    Paroxysmal A-fib (HCC)    Intracranial aneurysm    Fibromyalgia    Anxiety    H/O aortic valve replacement 02/02/2016  Cerebrovascular disease 02/02/2016   Hyperlipidemia 02/02/2016   Vertigo 01/07/2015   Bronchitis 12/02/2013   Malaise and fatigue 05/26/2013   PAF (paroxysmal atrial fibrillation) ([place]) 04/08/2013   HTN (hypertension)  04/08/2013   Chronic diastolic CHF (congestive heart failure) ([place]) 04/08/2013   CAD (coronary artery disease) 04/08/2013   Acute on chronic diastolic heart failure ([place]) 01/31/2013   Disorder of liver 10/07/2012   Solitary pulmonary nodule 10/07/2012   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/2012   Elevated blood pressure 09/12/2011   Dizziness 08/09/2011   Peripheral neuropathy 03/07/2011   Aortic insufficiency 03/06/2011   Dilated aortic root ([place]) 03/06/2011   Sarcoma ([place]) 03/06/2011   Hypothyroidism 03/06/2011   Polymyalgia rheumatica ([place]) 03/06/2011   PCP:  Donald Prose, MD Pharmacy:   [facility] [place], [place] - [address] [place] [address] Phone: [phone] Fax: [phone]  [facility] - [address], [address] [place] [address] Phone: [phone] Fax: [phone]  [facility] [phone] - [place], [place] - [address] [place] [address] Phone: [phone] Fax: [phone]  [facility] [place], [place] [address] [place] [address] Phone: [phone] Fax: [phone]     Social Determinants of Health (SDOH) Interventions    Readmission Risk Interventions No flowsheet data found.

## 2021-12-06 NOTE — Discharge Summary (Signed)
Physician Discharge Summary   VERBLE STYRON IRW:[id number] DOB: [birth year] DOA: 12/04/2021  PCP: Donald Prose, MD  Admit date: 12/04/2021 Discharge date: 12/06/2021   Admitted From: home Disposition:  home Discharging physician: Dwyane Dee, MD  Recommendations for Outpatient Follow-up:  Follow-up with pain clinic  Home Health: [facility]PT Equipment/Devices: none  Discharge Condition: stable CODE STATUS: Full Diet recommendation:  Diet Orders (From admission, onward)     Start     Ordered   12/06/21 0000  Diet - low sodium heart healthy        12/06/21 1533   12/05/21 1023  Diet Heart Room service appropriate? Yes; Fluid consistency: Thin  Diet effective now       Question Answer Comment  Room service appropriate? Yes   Fluid consistency: Thin      12/05/21 1023            Hospital Course: Ms. 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  She was reported to have overexerted herself recently which resulted in worsening of her chronic back pain. She underwent MRI L-spine on admission which was notable for severe spinal stenosis involving L3 with moderate L3 neural foraminal stenosis. She had no paresthesias, leg weakness, or incontinence.  She was treated with toradol and opioids and achieved good relief.  She was discharged with her daughter in law and instructions to continue ibuprofen short term at discharge until symptoms further improved.  She will also follow-up with her outpatient pain clinic  for any adjustments necessary to her pain regimen.     The patient's chronic medical conditions were treated accordingly per the patient's home medication regimen except as noted.  On day of discharge, patient was felt deemed stable for discharge. Patient/family member advised to call PCP or come back to ER if needed.   Principal Diagnosis: Intractable low back pain  Discharge Diagnoses: Principal Problem:   Intractable low back pain Active Problems:   Hypothyroidism   Peripheral neuropathy   PAF (paroxysmal atrial fibrillation) (HCC)   HTN (hypertension)   CAD (coronary artery disease)   Hyperlipidemia   Anxiety   Compression fracture of vertebrae (HCC)   S/P kyphoplasty   Macrocytosis   Thrombocytopenia (HCC)   Aortic atherosclerosis (HCC)   Low back pain   Discharge Instructions     Diet - low sodium heart healthy   Complete by: As directed    Increase activity slowly   Complete by: As directed       Allergies as of 12/06/2021       Reactions   Alendronate Sodium Nausea And Vomiting   Ambien [zolpidem Tartrate] Other (See Comments)   hallucinations   Lac Bovis Nausea Only   Drinks soy milk   Lyrica [pregabalin] Nausea And Vomiting   Milk-related Compounds Diarrhea   Pt can have foods that have milk in it, she just can't drink milk   Neurontin [gabapentin] Nausea And Vomiting   Procaine Hcl Other (See Comments)   Reaction unknown   Toprol Xl [metoprolol Succinate] Other (See Comments)   Does not remember. Tolerates Lopressor 04/10/13, [name]        Medication  List     STOP taking these medications    oxyCODONE 5 MG immediate release tablet Commonly known as: Oxy IR/ROXICODONE       TAKE these medications    acetaminophen 500 MG tablet Commonly known as: TYLENOL Take 1 tablet (500 mg total) by mouth every 6 (six) hours as needed for mild pain (pain.).   ALPRAZolam 0.5 MG tablet Commonly known as: XANAX Take 0.5 mg by mouth at bedtime.    amiodarone 200 MG tablet Commonly known as: PACERONE TAKE 1 TABLET BY MOUTH DAILY   Eliquis 2.5 MG Tabs tablet Generic drug: apixaban TAKE 1 TABLET TWICE A DAY What changed: how much to take   furosemide 40 MG tablet Commonly known as: LASIX Take 1 tablet daily and can take extra 1 tablet as needed for swelling What changed:  how much to take how to take this when to take this additional instructions   ibuprofen 400 MG tablet Commonly known as: ADVIL Take 1 tablet (400 mg total) by mouth every 6 (six) hours as needed for up to 3 days for moderate pain.   lactase 3000 units tablet Commonly known as: LACTAID Take 1 tablet by mouth daily as needed (only when eating dairy).   levothyroxine 75 MCG tablet Commonly known as: SYNTHROID Take 75 mcg by mouth at bedtime.   multivitamin with minerals Tabs tablet Take 1 tablet by mouth daily.   oxyCODONE-acetaminophen 5-325 MG tablet Commonly known as: PERCOCET/ROXICET Take 1 tablet by mouth 3 (three) times daily as needed for moderate pain.   polyethylene glycol 17 g packet Commonly known as: MIRALAX / GLYCOLAX Take 17 g by mouth daily as needed for mild constipation.   potassium chloride SA 20 MEQ tablet Commonly known as: KLOR-CON M TAKE 1 TABLET(20 MEQ) BY MOUTH DAILY What changed: See the new instructions.   pravastatin 40 MG tablet Commonly known as: PRAVACHOL TAKE 1 TABLET(40 MG) BY MOUTH EVERY EVENING What changed: See the new instructions.   rOPINIRole 0.25 MG tablet Commonly known as: REQUIP Take 0.25 mg by mouth at bedtime.   senna 8.6 MG tablet Commonly known as: SENOKOT Take 2 tablets by mouth daily as needed for constipation.        Follow-up Information     [facility] Follow up.   Specialty: Home Health Services Contact information: [address] [place] [address] [phone]                Allergies  Allergen Reactions   Alendronate Sodium Nausea And  Vomiting   Ambien [Zolpidem Tartrate] Other (See Comments)    hallucinations   Lac Bovis Nausea Only    Drinks soy milk   Lyrica [Pregabalin] Nausea And Vomiting   Milk-Related Compounds Diarrhea    Pt can have foods that have milk in it, she just can't drink milk   Neurontin [Gabapentin] Nausea And Vomiting   Procaine Hcl Other (See Comments)    Reaction unknown   Toprol Xl [Metoprolol Succinate] Other (See Comments)    Does not remember. Tolerates Lopressor 04/10/13, [name]    Consultations:   Discharge Exam: BP 110/90 (BP Location: Right Arm)    Pulse 80    Temp 99.2 F (37.3 C) (Oral)    Resp 18    Wt 60.7 kg    LMP  (LMP Unknown)    SpO2 92%    BMI 22.27 kg/m  Physical Exam Constitutional:      Appearance: Normal appearance.  HENT:     Head: Normocephalic and atraumatic.     Mouth/Throat:     Mouth: Mucous membranes are moist.  Eyes:     Extraocular Movements: Extraocular movements intact.  Cardiovascular:     Rate and Rhythm: Normal rate and regular rhythm.  Pulmonary:     Effort: Pulmonary effort is normal.     Breath sounds: Normal breath sounds.  Abdominal:     General: Bowel sounds are normal. There is no distension.     Palpations: Abdomen is soft.     Tenderness: There is no abdominal tenderness.  Musculoskeletal:        General: Normal range of motion.     Cervical back: Normal range of motion and neck supple.  Skin:    General: Skin is warm and dry.  Neurological:     General: No focal deficit present.     Mental Status: She is alert.  Psychiatric:        Mood and Affect: Mood normal.        Behavior: Behavior normal.     The results of significant diagnostics from this hospitalization (including imaging, microbiology, ancillary and laboratory) are listed below for reference.   Microbiology: Recent Results (from the past 240 hour(s))  Resp Panel by RT-PCR (Flu A&B, Covid) Nasopharyngeal Swab     Status: None   Collection Time: 12/05/21 10:32 AM    Specimen: Nasopharyngeal Swab; Nasopharyngeal(NP) swabs in vial transport medium  Result Value Ref Range Status   SARS Coronavirus 2 by RT PCR NEGATIVE NEGATIVE Final    Comment: (NOTE) SARS-CoV-2 target nucleic acids are NOT DETECTED.  The SARS-CoV-2 RNA is generally detectable in upper respiratory specimens during the acute phase of infection. The lowest concentration of SARS-CoV-2 viral copies this assay can detect is 138 copies/mL. A negative result does not preclude SARS-Cov-2 infection and should not be used as the sole basis for treatment or other patient management decisions. A negative result may occur with  improper specimen collection/handling, submission of specimen other than nasopharyngeal swab, presence of viral mutation(s) within the areas targeted by this assay, and inadequate number of viral copies(<138 copies/mL). A negative result must be combined with clinical observations, patient history, and epidemiological information. The expected result is Negative.  Fact Sheet for Patients:  EntrepreneurPulse.com.au  Fact Sheet for Healthcare Providers:  IncredibleEmployment.be  This test is no t yet approved or cleared by the [place] FDA and  has been authorized for detection and/or diagnosis of SARS-CoV-2 by FDA under an Emergency Use Authorization (EUA). This EUA will remain  in effect (meaning this test can be used) for the duration of the COVID-19 declaration under Section 564(b)(1) of the Act, 21 U.S.C.section 360bbb-3(b)(1), unless the authorization is terminated  or revoked sooner.       Influenza A by PCR NEGATIVE NEGATIVE Final   Influenza B by PCR NEGATIVE NEGATIVE Final    Comment: (NOTE) The Xpert Xpress SARS-CoV-2/FLU/RSV plus assay is intended as an aid in the diagnosis of influenza from Nasopharyngeal swab specimens and should not be used as a sole basis for treatment. Nasal washings and aspirates are  unacceptable for Xpert Xpress SARS-CoV-2/FLU/RSV testing.  Fact Sheet for Patients: EntrepreneurPulse.com.au  Fact Sheet for Healthcare Providers: IncredibleEmployment.be  This test is not yet approved or cleared by the [place] FDA and has been authorized for detection and/or diagnosis of SARS-CoV-2 by FDA under an Emergency Use Authorization (EUA). This EUA will remain in effect (meaning this test  can be used) for the duration of the COVID-19 declaration under Section 564(b)(1) of the Act, 21 U.S.C. section 360bbb-3(b)(1), unless the authorization is terminated or revoked.  Performed at [facility], [place] [address], [place], [place] [address]      Labs: BNP (last 3 results) No results for input(s): BNP in the last 8760 hours. Basic Metabolic Panel: Recent Labs  Lab 12/05/21 0215  NA 135  K 3.8  CL 100  CO2 25  GLUCOSE 98  BUN 10  CREATININE 0.67  CALCIUM 9.0   Liver Function Tests: No results for input(s): AST, ALT, ALKPHOS, BILITOT, PROT, ALBUMIN in the last 168 hours. No results for input(s): LIPASE, AMYLASE in the last 168 hours. No results for input(s): AMMONIA in the last 168 hours. CBC: Recent Labs  Lab 12/05/21 0215  WBC 5.5  NEUTROABS 4.1  HGB 12.6  HCT 38.3  MCV 100.5*  PLT 134*   Cardiac Enzymes: No results for input(s): CKTOTAL, CKMB, CKMBINDEX, TROPONINI in the last 168 hours. BNP: Invalid input(s): POCBNP CBG: No results for input(s): GLUCAP in the last 168 hours. D-Dimer No results for input(s): DDIMER in the last 72 hours. Hgb A1c No results for input(s): HGBA1C in the last 72 hours. Lipid Profile No results for input(s): CHOL, HDL, LDLCALC, TRIG, CHOLHDL, LDLDIRECT in the last 72 hours. Thyroid function studies No results for input(s): TSH, T4TOTAL, T3FREE, THYROIDAB in the last 72 hours.  Invalid input(s): FREET3 Anemia work up No results for input(s): VITAMINB12,  FOLATE, FERRITIN, TIBC, IRON, RETICCTPCT in the last 72 hours. Urinalysis    Component Value Date/Time   COLORURINE AMBER (A) 07/31/2020 0905   APPEARANCEUR CLOUDY (A) 07/31/2020 0905   LABSPEC 1.017 07/31/2020 0905   PHURINE 5.0 07/31/2020 0905   GLUCOSEU NEGATIVE 07/31/2020 0905   HGBUR MODERATE (A) 07/31/2020 0905   BILIRUBINUR NEGATIVE 07/31/2020 0905   KETONESUR 20 (A) 07/31/2020 0905   PROTEINUR 30 (A) 07/31/2020 0905   UROBILINOGEN 0.2 03/26/2013 1330   NITRITE POSITIVE (A) 07/31/2020 0905   LEUKOCYTESUR LARGE (A) 07/31/2020 0905   Sepsis Labs Invalid input(s): PROCALCITONIN,  WBC,  LACTICIDVEN Microbiology Recent Results (from the past 240 hour(s))  Resp Panel by RT-PCR (Flu A&B, Covid) Nasopharyngeal Swab     Status: None   Collection Time: 12/05/21 10:32 AM   Specimen: Nasopharyngeal Swab; Nasopharyngeal(NP) swabs in vial transport medium  Result Value Ref Range Status   SARS Coronavirus 2 by RT PCR NEGATIVE NEGATIVE Final    Comment: (NOTE) SARS-CoV-2 target nucleic acids are NOT DETECTED.  The SARS-CoV-2 RNA is generally detectable in upper respiratory specimens during the acute phase of infection. The lowest concentration of SARS-CoV-2 viral copies this assay can detect is 138 copies/mL. A negative result does not preclude SARS-Cov-2 infection and should not be used as the sole basis for treatment or other patient management decisions. A negative result may occur with  improper specimen collection/handling, submission of specimen other than nasopharyngeal swab, presence of viral mutation(s) within the areas targeted by this assay, and inadequate number of viral copies(<138 copies/mL). A negative result must be combined with clinical observations, patient history, and epidemiological information. The expected result is Negative.  Fact Sheet for Patients:  EntrepreneurPulse.com.au  Fact Sheet for Healthcare Providers:   IncredibleEmployment.be  This test is no t yet approved or cleared by the [place] FDA and  has been authorized for detection and/or diagnosis of SARS-CoV-2 by FDA under an Emergency Use Authorization (EUA). This EUA  will remain  in effect (meaning this test can be used) for the duration of the COVID-19 declaration under Section 564(b)(1) of the Act, 21 U.S.C.section 360bbb-3(b)(1), unless the authorization is terminated  or revoked sooner.       Influenza A by PCR NEGATIVE NEGATIVE Final   Influenza B by PCR NEGATIVE NEGATIVE Final    Comment: (NOTE) The Xpert Xpress SARS-CoV-2/FLU/RSV plus assay is intended as an aid in the diagnosis of influenza from Nasopharyngeal swab specimens and should not be used as a sole basis for treatment. Nasal washings and aspirates are unacceptable for Xpert Xpress SARS-CoV-2/FLU/RSV testing.  Fact Sheet for Patients: EntrepreneurPulse.com.au  Fact Sheet for Healthcare Providers: IncredibleEmployment.be  This test is not yet approved or cleared by the [place] FDA and has been authorized for detection and/or diagnosis of SARS-CoV-2 by FDA under an Emergency Use Authorization (EUA). This EUA will remain in effect (meaning this test can be used) for the duration of the COVID-19 declaration under Section 564(b)(1) of the Act, 21 U.S.C. section 360bbb-3(b)(1), unless the authorization is terminated or revoked.  Performed at [facility], [place] [address] [place], [place] [address]     Procedures/Studies: CT Thoracic Spine Wo Contrast  Result Date: 12/04/2021 CLINICAL DATA:  Worsening back pain EXAM: CT THORACIC SPINE WITHOUT CONTRAST TECHNIQUE: Multidetector CT images of the thoracic were obtained using the standard protocol without intravenous contrast. RADIATION DOSE REDUCTION: This exam was performed according to the departmental dose-optimization program which  includes automated exposure control, adjustment of the mA and/or kV according to patient size and/or use of iterative reconstruction technique. COMPARISON:  Chest x-ray 10/16/2021, thoracic radiograph 12/13/2018 FINDINGS: Alignment: Normal. Vertebrae: Post augmentation changes at T11 and T12. Age indeterminate mild superior endplate deformity at O70. Remaining vertebra demonstrate normal stature. Paraspinal and other soft tissues: Cardiomegaly with marked right atrial enlargement. Aortic valve prosthesis. Cardiomegaly. Dilated ascending aorta measuring up to 4.2 cm. Disc levels: Disc spaces are generally patent. Minimal degenerative osteophytes at T9-T10. Flowing osteophytes at T11-T12 and T12-L1. IMPRESSION: 1. Mild age indeterminate superior endplate deformity at J62. 2. Treated compression fractures at T11 and T12 3. Cardiomegaly with marked right atrial enlargement 4. Dilated ascending aorta measuring up to 4.2 cm. Recommend annual imaging followup by CTA or MRA. This recommendation follows 2010 ACCF/AHA/AATS/ACR/ASA/SCA/SCAI/SIR/STS/SVM Guidelines for the Diagnosis and Management of Patients with Thoracic Aortic Disease. Circulation. 2010; 121: E366-Q947. Aortic aneurysm NOS (ICD10-I71.9) Electronically Signed   By: Donavan Foil M.D.   On: 12/04/2021 20:38   CT Lumbar Spine Wo Contrast  Result Date: 12/04/2021 CLINICAL DATA:  Back pain EXAM: CT LUMBAR SPINE WITHOUT CONTRAST TECHNIQUE: Multidetector CT imaging of the lumbar spine was performed without intravenous contrast administration. Multiplanar CT image reconstructions were also generated. RADIATION DOSE REDUCTION: This exam was performed according to the departmental dose-optimization program which includes automated exposure control, adjustment of the mA and/or kV according to patient size and/or use of iterative reconstruction technique. COMPARISON:  CT 10/22/2019, 07/31/2020 FINDINGS: Segmentation: 5 lumbar type vertebrae. Alignment: Mild  scoliosis. Vertebrae: Incompletely visualized treated compression fracture at T12. Chronic compression fracture at L1, and L4. Severe compression fracture at L3 post augmentation. No acute fracture is seen. Old transverse process fractures on the left at L2 and on the right at L3. Paraspinal and other soft tissues: Aortic atherosclerosis. No paravertebral or paraspinal soft tissue abnormality is visualized. Disc levels: At T12-L1, no significant canal stenosis.  The foramen are patent. At L1-L2, vacuum discs. No high-grade  canal stenosis. Mild facet degenerative changes. The foramen are patent bilaterally. At L2-L3, severe canal stenosis, secondary to retropulsion of chronic compression fracture. Moderate facet degenerative changes bilaterally. At L3 L4, diffuse disc bulge. Mild canal stenosis. Moderate facet degenerative changes. At L4-L5, vacuum discs. Moderate diffuse disc bulge. Moderate canal stenosis. Moderate facet hypertrophic degenerative change and ligamentum flavum thickening. Left greater than right foraminal narrowing. At L5-S1, maintained disc space. No canal stenosis. Moderate bilateral facet degenerative changes. The foramen are grossly patent. IMPRESSION: 1. Chronic compression fractures at L1 and L4. 2. Severe chronic treated compression fracture at L3. Severe canal stenosis at L3 secondary to retropulsion of chronic fracture fragments. 3. No definite acute osseous abnormality is seen Electronically Signed   By: Donavan Foil M.D.   On: 12/04/2021 20:47   MR Lumbar Spine W Wo Contrast  Result Date: 12/05/2021 CLINICAL DATA:  88 year old female with back pain. Lumbar compression fractures. EXAM: MRI LUMBAR SPINE WITHOUT AND WITH CONTRAST TECHNIQUE: Multiplanar and multiecho pulse sequences of the lumbar spine were obtained without and with intravenous contrast. CONTRAST:  57mL GADAVIST GADOBUTROL 1 MMOL/ML IV SOLN COMPARISON:  CT thoracic and lumbar spine yesterday. Lumbar MRI 04/09/2018.  FINDINGS: Segmentation: Normal on the CT yesterday, which is the same numbering system used on the 2019 MRI. Alignment: Overall lumbar lordosis not significantly changed from 2019. Vertebrae: Previously augmented compression fractures of T11, T12, L3. The L3 compression was acute or subacute in 2019, now with severe vertebral loss of height and bulky retropulsion as demonstrated by CT. Chronic un treated L1 and L4 inferior endplate compression with superimposed Schmorl's node. Mild T10 compression also suspected but appears to be chronic. L5 appears stable and intact. No convincing marrow edema or acute osseous abnormality. Intact visible sacrum and SI joints. Conus medullaris and cauda equina: Conus extends to the T12-L1 level. No lower spinal cord or conus signal abnormality. No abnormal intradural enhancement. No dural thickening identified. Paraspinal and other soft tissues: Partially visible gastric hiatal hernia is moderate to large. Negative visible abdominal viscera. Lumbar paraspinal soft tissues remain within normal limits. Disc levels: T10-T11: Mild spinal stenosis primarily due to mild retropulsion of T11. T11-T12: Mild retropulsion but no significant stenosis. T12-L1: Mild spinal stenosis is new since 2019 related to retropulsion mostly at the L1 level. L1-L2:  Negative for age. L2-L3 and L3-L4: Severe spinal stenosis at the L3 level related to bulky retropulsion (series 13, image 9 and series 11, image 31. And moderate associated left L3 neural foraminal stenosis. L4-L5: Mild chronic spinal stenosis in part related to L4 posteroinferior endplate retropulsion has not significantly changed since 2019 along with moderate to severe L4 foraminal stenosis greater on the left. L5-S1: Stable and negative for age. Right S1 chronic nerve root diverticulum. IMPRESSION: 1. No acute osseous abnormality in the lumbar spine. Multilevel chronic compression fractures, previously augmented at T11, T12, and L3. Bulky  retropulsion of the L3 vertebral body results in severe spinal stenosis at that level and contributes to moderate left L3 neural foraminal stenosis. 2. Mild chronic spinal stenosis with moderate to severe left greater than right foraminal stenosis at L4-L5 is in part due to L4 retropulsion and has not significantly changed since 2019. 3. Mild new lower thoracic spinal stenosis related to retropulsion. Visible lower thoracic spinal cord and conus remain within normal limits. 4. Gastric hiatal hernia. Electronically Signed   By: Genevie Ann M.D.   On: 12/05/2021 08:23     Time coordinating discharge: Over 30 minutes  Dwyane Dee, MD  [facility] 12/06/2021, 7:34 PM

## 2021-12-06 NOTE — Progress Notes (Signed)
Pt discharged with daughter in law in stable condition. Discharge instructions given. Script sent to pharmacy of choice. No immediate questions or concerns right now. Discharged from unit via wheelchair.

## 2021-12-06 NOTE — Evaluation (Signed)
Physical Therapy Evaluation Patient Details Name: Kayla Chambers MRN: [id number] DOB: 21-Oct-1927 Today's Date: 12/06/2021  History of Present Illness  Patient is 88 y.o. female presenting to ED with progressively worse back pain since she moved around and did some activity on her own using her walker with no additional help. CT thoracic and lumbar spine did not show any acute osseous abnormality.  However, there was multilevel chronic compression fraction T11, T12 and L3.  Bulky retropulsion of the L3 vertebral body resulting in severe spinal stenosis at the level and contributes to moderate left L3 neural foraminal stenosis. 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    Clinical Impression  Kayla Chambers is 88 y.o. female admitted with above HPI and diagnosis. Patient is currently limited by functional impairments below (see PT problem list). Patient lives with her husband and her daughter-in-law provides assist to them both. Pt mobilizes with RW in home at baseline. Currently pt requires Min assist for transfers and gait with RW, pt with knees flexed as she fatigued and ambulated ~40' and then 20'. Pt's daughter in law present and provided assit for pt to ambulate 20' Repeated instruction needed on importance of use of gait belt to assist pt for safety. Discussed discharge plan to enter home where there are 5 steps to ascend. Pt's daughter can arrange +2 assist and has wheelchair to move pt from car to steps to prevent fatigue prior ascending stairs. Patient will benefit from continued skilled PT interventions to address impairments and progress independence with  mobility, recommending HHPT. Acute PT will follow and progress as able.        Recommendations for follow up therapy are one component of a multi-disciplinary discharge planning process, led by the attending physician.  Recommendations may be updated based on patient status, additional functional criteria and insurance authorization.  Follow Up Recommendations Home health PT    Assistance Recommended at Discharge Frequent or constant Supervision/Assistance  Patient can return home with the following  A little help with walking and/or transfers;A little help with bathing/dressing/bathroom;Direct supervision/assist for medications management;Direct supervision/assist for financial management;Assist for transportation;Help with stairs or ramp for entrance;Assistance with cooking/housework    Equipment Recommendations None recommended by PT  Recommendations for Other Services       Functional Status Assessment Patient has had a recent decline in their functional status and demonstrates the ability to make significant improvements in function in a reasonable and predictable amount of time.     Precautions / Restrictions Precautions Precautions: Fall Restrictions Weight Bearing Restrictions: No      Mobility  Bed Mobility Overal bed mobility: Needs Assistance Bed Mobility: Supine to Sit     Supine to sit: Min assist, HOB elevated     General bed mobility comments: pt taking extra time and HOB slightly elevated. pt required slight assist to pivot and scoot to EOB.    Transfers Overall transfer level: Needs assistance Equipment used: Rolling walker (2 wheels) Transfers: Sit to/from Stand Sit to Stand: Min assist           General transfer comment: cues for safety to wait for walker and cues to prevent LOB with rise. assist to steady with power up. Pt with flexed posture in standing.    Ambulation/Gait  Ambulation/Gait assistance: Min assist Gait Distance (Feet): 40 Feet  (40,20) Assistive device: Rolling walker (2 wheels) Gait Pattern/deviations: Step-to pattern, Decreased step length - right, Decreased step length - left, Decreased stride length, Shuffle, Trunk flexed, Decreased dorsiflexion - right, Decreased dorsiflexion - left Gait velocity: decr     General Gait Details: decreased hip/knee flexion and limtied dorsiflexion with steps. pt with knee's flexing/buckling as she fatigued and min assist required to prevent LOB for pt and manage walker direction. Pt's daughter-in-law present and walked ~20' with pt providing assist and guarding with gait belt. Repeated education needed for pt's family to use belt for safety to prevent fall.  Stairs            Wheelchair Mobility    Modified Rankin (Stroke Patients Only)       Balance Overall balance assessment: Needs assistance Sitting-balance support: Feet supported Sitting balance-Leahy Scale: Good     Standing balance support: During functional activity, Bilateral upper extremity supported, Reliant on assistive device for balance Standing balance-Leahy Scale: Poor Standing balance comment: reliant on external support of walker and family                             Pertinent Vitals/Pain Pain Assessment Pain Assessment: No/denies pain    Home Living Family/patient expects to be discharged to:: Private residence Living Arrangements: Spouse/significant other;Children (son, and daughter-in-law) Available Help at Discharge: Family Type of Home: House Home Access: Stairs to enter Entrance Stairs-Rails: Can reach both Entrance Stairs-Number of Steps: 5 Alternate Level Stairs-Number of Steps: stays on main level Home Layout: Two level;Full bath on main level;Able to live on main level with bedroom/bathroom Home Equipment: Rolling Walker (2 wheels);Transport chair;BSC/3in1;Tub bench Additional Comments: pt's daughter-in-law helps with mobility and is caregiver for her father and  mother-in-law.    Prior Function Prior Level of Function : Needs assist       Physical Assist : Mobility (physical);ADLs (physical) Mobility (physical): Transfers;Gait;Stairs ADLs (physical): Bathing;Dressing Mobility Comments: daughter-in-law helps with walking, pt using RW and daugther in law follows/guards her       Hand Dominance        Extremity/Trunk Assessment   Upper Extremity Assessment Upper Extremity Assessment: Generalized weakness    Lower Extremity Assessment Lower Extremity Assessment: Generalized weakness    Cervical / Trunk Assessment Cervical / Trunk Assessment: Kyphotic  Communication   Communication: No difficulties  Cognition Arousal/Alertness: Awake/alert Behavior During Therapy: WFL for tasks assessed/performed Overall Cognitive Status: Within Functional Limits for tasks assessed                                          General Comments      Exercises     Assessment/Plan    PT Assessment Patient needs continued PT services  PT Problem List Decreased strength;Decreased range of motion;Decreased activity tolerance;Decreased balance;Decreased mobility;Decreased knowledge of use of DME;Decreased knowledge of precautions       PT Treatment Interventions DME instruction;Gait training;Stair training;Functional mobility training;Therapeutic activities;Therapeutic exercise;Balance training;Patient/family education    PT Goals (Current goals can be found in the Care Plan section)  Acute Rehab PT Goals Patient Stated Goal: get home and back to feel better PT Goal Formulation: With patient/family Time For Goal Achievement: 12/20/21 Potential to Achieve Goals: Good    Frequency Min 3X/week     Co-evaluation  AM-PAC PT "6 Clicks" Mobility  Outcome Measure Help needed turning from your back to your side while in a flat bed without using bedrails?: A Little Help needed moving from lying on your back to  sitting on the side of a flat bed without using bedrails?: A Little Help needed moving to and from a bed to a chair (including a wheelchair)?: A Little Help needed standing up from a chair using your arms (e.g., wheelchair or bedside chair)?: A Little Help needed to walk in hospital room?: A Little Help needed climbing 3-5 steps with a railing? : A Lot 6 Click Score: 17    End of Session Equipment Utilized During Treatment: Gait belt Activity Tolerance: Patient tolerated treatment well Patient left: in chair;with call bell/phone within reach;with family/visitor present Nurse Communication: Mobility status PT Visit Diagnosis: Muscle weakness (generalized) (M62.81);Difficulty in walking, not elsewhere classified (R26.2);Unsteadiness on feet (R26.81);Other abnormalities of gait and mobility (R26.89)    Time: 1429-1500 PT Time Calculation (min) (ACUTE ONLY): 31 min   Charges:   PT Evaluation $PT Eval Low Complexity: 1 Low PT Treatments $Gait Training: 8-22 mins        Verner Mould, DPT Acute Rehabilitation Services Office [phone] Pager [phone]   Jacques Navy 12/06/2021, 4:21 PM

## 2021-12-06 NOTE — Plan of Care (Signed)
°  Problem: Education: Goal: Knowledge of General Education information will improve Description: Including pain rating scale, medication(s)/side effects and non-pharmacologic comfort measures 12/06/2021 1547 by Ronita Hipps, RN Outcome: Adequate for Discharge 12/06/2021 1547 by Ronita Hipps, RN Outcome: Adequate for Discharge   Problem: Health Behavior/Discharge Planning: Goal: Ability to manage health-related needs will improve 12/06/2021 1547 by Ronita Hipps, RN Outcome: Adequate for Discharge 12/06/2021 1547 by Ronita Hipps, RN Outcome: Adequate for Discharge   Problem: Clinical Measurements: Goal: Ability to maintain clinical measurements within normal limits will improve 12/06/2021 1547 by Ronita Hipps, RN Outcome: Adequate for Discharge 12/06/2021 1547 by Ronita Hipps, RN Outcome: Adequate for Discharge Goal: Will remain free from infection 12/06/2021 1547 by Ronita Hipps, RN Outcome: Adequate for Discharge 12/06/2021 1547 by Ronita Hipps, RN Outcome: Adequate for Discharge Goal: Diagnostic test results will improve 12/06/2021 1547 by Ronita Hipps, RN Outcome: Adequate for Discharge 12/06/2021 1547 by Ronita Hipps, RN Outcome: Adequate for Discharge Goal: Respiratory complications will improve 12/06/2021 1547 by Ronita Hipps, RN Outcome: Adequate for Discharge 12/06/2021 1547 by Ronita Hipps, RN Outcome: Adequate for Discharge Goal: Cardiovascular complication will be avoided 12/06/2021 1547 by Ronita Hipps, RN Outcome: Adequate for Discharge 12/06/2021 1547 by Ronita Hipps, RN Outcome: Adequate for Discharge   Problem: Activity: Goal: Risk for activity intolerance will decrease 12/06/2021 1547 by Ronita Hipps, RN Outcome: Adequate for Discharge 12/06/2021 1547 by Ronita Hipps, RN Outcome: Adequate for Discharge   Problem: Nutrition: Goal: Adequate nutrition will be  maintained 12/06/2021 1547 by Ronita Hipps, RN Outcome: Adequate for Discharge 12/06/2021 1547 by Ronita Hipps, RN Outcome: Adequate for Discharge   Problem: Coping: Goal: Level of anxiety will decrease 12/06/2021 1547 by Ronita Hipps, RN Outcome: Adequate for Discharge 12/06/2021 1547 by Ronita Hipps, RN Outcome: Adequate for Discharge   Problem: Elimination: Goal: Will not experience complications related to bowel motility 12/06/2021 1547 by Ronita Hipps, RN Outcome: Adequate for Discharge 12/06/2021 1547 by Ronita Hipps, RN Outcome: Adequate for Discharge Goal: Will not experience complications related to urinary retention 12/06/2021 1547 by Ronita Hipps, RN Outcome: Adequate for Discharge 12/06/2021 1547 by Ronita Hipps, RN Outcome: Adequate for Discharge   Problem: Pain Managment: Goal: General experience of comfort will improve 12/06/2021 1547 by Ronita Hipps, RN Outcome: Adequate for Discharge 12/06/2021 1547 by Ronita Hipps, RN Outcome: Adequate for Discharge   Problem: Safety: Goal: Ability to remain free from injury will improve 12/06/2021 1547 by Ronita Hipps, RN Outcome: Adequate for Discharge 12/06/2021 1547 by Ronita Hipps, RN Outcome: Adequate for Discharge   Problem: Skin Integrity: Goal: Risk for impaired skin integrity will decrease 12/06/2021 1547 by Ronita Hipps, RN Outcome: Adequate for Discharge 12/06/2021 1547 by Ronita Hipps, RN Outcome: Adequate for Discharge   Problem: Coping: Goal: Ability to identify and develop effective coping behavior will improve 12/06/2021 1547 by Ronita Hipps, RN Outcome: Adequate for Discharge 12/06/2021 1547 by Ronita Hipps, RN Outcome: Adequate for Discharge

## 2021-12-08 DIAGNOSIS — E039 Hypothyroidism, unspecified: Secondary | ICD-10-CM | POA: Diagnosis not present

## 2021-12-08 DIAGNOSIS — I5032 Chronic diastolic (congestive) heart failure: Secondary | ICD-10-CM | POA: Diagnosis not present

## 2021-12-08 DIAGNOSIS — Z952 Presence of prosthetic heart valve: Secondary | ICD-10-CM | POA: Diagnosis not present

## 2021-12-08 DIAGNOSIS — F0394 Unspecified dementia, unspecified severity, with anxiety: Secondary | ICD-10-CM | POA: Diagnosis not present

## 2021-12-08 DIAGNOSIS — I2510 Atherosclerotic heart disease of native coronary artery without angina pectoris: Secondary | ICD-10-CM | POA: Diagnosis not present

## 2021-12-08 DIAGNOSIS — G43909 Migraine, unspecified, not intractable, without status migrainosus: Secondary | ICD-10-CM | POA: Diagnosis not present

## 2021-12-08 DIAGNOSIS — I480 Paroxysmal atrial fibrillation: Secondary | ICD-10-CM | POA: Diagnosis not present

## 2021-12-08 DIAGNOSIS — K449 Diaphragmatic hernia without obstruction or gangrene: Secondary | ICD-10-CM | POA: Diagnosis not present

## 2021-12-08 DIAGNOSIS — Z85831 Personal history of malignant neoplasm of soft tissue: Secondary | ICD-10-CM | POA: Diagnosis not present

## 2021-12-08 DIAGNOSIS — I110 Hypertensive heart disease with heart failure: Secondary | ICD-10-CM | POA: Diagnosis not present

## 2021-12-08 DIAGNOSIS — Z8709 Personal history of other diseases of the respiratory system: Secondary | ICD-10-CM | POA: Diagnosis not present

## 2021-12-08 DIAGNOSIS — D7589 Other specified diseases of blood and blood-forming organs: Secondary | ICD-10-CM | POA: Diagnosis not present

## 2021-12-08 DIAGNOSIS — I099 Rheumatic heart disease, unspecified: Secondary | ICD-10-CM | POA: Diagnosis not present

## 2021-12-08 DIAGNOSIS — G629 Polyneuropathy, unspecified: Secondary | ICD-10-CM | POA: Diagnosis not present

## 2021-12-08 DIAGNOSIS — Z7901 Long term (current) use of anticoagulants: Secondary | ICD-10-CM | POA: Diagnosis not present

## 2021-12-08 DIAGNOSIS — G8929 Other chronic pain: Secondary | ICD-10-CM | POA: Diagnosis not present

## 2021-12-08 DIAGNOSIS — D696 Thrombocytopenia, unspecified: Secondary | ICD-10-CM | POA: Diagnosis not present

## 2021-12-08 DIAGNOSIS — M353 Polymyalgia rheumatica: Secondary | ICD-10-CM | POA: Diagnosis not present

## 2021-12-08 DIAGNOSIS — R42 Dizziness and giddiness: Secondary | ICD-10-CM | POA: Diagnosis not present

## 2021-12-08 DIAGNOSIS — M4854XD Collapsed vertebra, not elsewhere classified, thoracic region, subsequent encounter for fracture with routine healing: Secondary | ICD-10-CM | POA: Diagnosis not present

## 2021-12-08 DIAGNOSIS — I700 Atherosclerosis of aorta: Secondary | ICD-10-CM | POA: Diagnosis not present

## 2021-12-08 DIAGNOSIS — E785 Hyperlipidemia, unspecified: Secondary | ICD-10-CM | POA: Diagnosis not present

## 2021-12-08 DIAGNOSIS — M4856XD Collapsed vertebra, not elsewhere classified, lumbar region, subsequent encounter for fracture with routine healing: Secondary | ICD-10-CM | POA: Diagnosis not present

## 2021-12-08 DIAGNOSIS — M797 Fibromyalgia: Secondary | ICD-10-CM | POA: Diagnosis not present

## 2021-12-08 DIAGNOSIS — M48061 Spinal stenosis, lumbar region without neurogenic claudication: Secondary | ICD-10-CM | POA: Diagnosis not present

## 2021-12-12 ENCOUNTER — Other Ambulatory Visit: Payer: Self-pay | Admitting: Cardiology

## 2021-12-13 DIAGNOSIS — G894 Chronic pain syndrome: Secondary | ICD-10-CM | POA: Diagnosis not present

## 2021-12-13 NOTE — Telephone Encounter (Signed)
Prescription refill request for Eliquis received. Indication:Afib Last office visit:10/22 Scr:0.6 Age: 88 Weight:60.7 kg  Prescription refilled

## 2021-12-19 DIAGNOSIS — I480 Paroxysmal atrial fibrillation: Secondary | ICD-10-CM | POA: Diagnosis not present

## 2021-12-19 DIAGNOSIS — E039 Hypothyroidism, unspecified: Secondary | ICD-10-CM | POA: Diagnosis not present

## 2021-12-19 DIAGNOSIS — F0390 Unspecified dementia without behavioral disturbance: Secondary | ICD-10-CM | POA: Diagnosis not present

## 2021-12-19 DIAGNOSIS — I5032 Chronic diastolic (congestive) heart failure: Secondary | ICD-10-CM | POA: Diagnosis not present

## 2021-12-19 DIAGNOSIS — I2510 Atherosclerotic heart disease of native coronary artery without angina pectoris: Secondary | ICD-10-CM | POA: Diagnosis not present

## 2021-12-19 DIAGNOSIS — I10 Essential (primary) hypertension: Secondary | ICD-10-CM | POA: Diagnosis not present

## 2021-12-19 DIAGNOSIS — G8929 Other chronic pain: Secondary | ICD-10-CM | POA: Diagnosis not present

## 2021-12-19 DIAGNOSIS — I110 Hypertensive heart disease with heart failure: Secondary | ICD-10-CM | POA: Diagnosis not present

## 2021-12-19 DIAGNOSIS — M810 Age-related osteoporosis without current pathological fracture: Secondary | ICD-10-CM | POA: Diagnosis not present

## 2021-12-19 DIAGNOSIS — E7800 Pure hypercholesterolemia, unspecified: Secondary | ICD-10-CM | POA: Diagnosis not present

## 2021-12-20 DIAGNOSIS — I480 Paroxysmal atrial fibrillation: Secondary | ICD-10-CM | POA: Diagnosis not present

## 2021-12-20 DIAGNOSIS — E7800 Pure hypercholesterolemia, unspecified: Secondary | ICD-10-CM | POA: Diagnosis not present

## 2021-12-20 DIAGNOSIS — I5032 Chronic diastolic (congestive) heart failure: Secondary | ICD-10-CM | POA: Diagnosis not present

## 2021-12-20 DIAGNOSIS — I110 Hypertensive heart disease with heart failure: Secondary | ICD-10-CM | POA: Diagnosis not present

## 2022-01-02 DIAGNOSIS — L570 Actinic keratosis: Secondary | ICD-10-CM | POA: Diagnosis not present

## 2022-01-02 DIAGNOSIS — L820 Inflamed seborrheic keratosis: Secondary | ICD-10-CM | POA: Diagnosis not present

## 2022-01-02 DIAGNOSIS — R208 Other disturbances of skin sensation: Secondary | ICD-10-CM | POA: Diagnosis not present

## 2022-01-02 DIAGNOSIS — D485 Neoplasm of uncertain behavior of skin: Secondary | ICD-10-CM | POA: Diagnosis not present

## 2022-01-02 DIAGNOSIS — L538 Other specified erythematous conditions: Secondary | ICD-10-CM | POA: Diagnosis not present

## 2022-01-02 DIAGNOSIS — Z789 Other specified health status: Secondary | ICD-10-CM | POA: Diagnosis not present

## 2022-01-04 ENCOUNTER — Telehealth: Payer: Self-pay | Admitting: Cardiology

## 2022-01-04 NOTE — Telephone Encounter (Signed)
Spoke with pt husband and daughter-n-law, they report swelling in her legs and feet, left > right. They report they are also weeping. Her weight is the same and she has no SOB. Okay given for them to increase furosemide to 80 mg once daily for the next 3 days. She will also take an extra potassium with the extra furosemide. They will call first of the week if no improvement.

## 2022-01-04 NOTE — Telephone Encounter (Signed)
Patient's daughter in law Levada Dy calling back. She would like to know about a barrier cream for her weeping edema to protect against injection. She would like to know if the patient needs to use an antiseptic cream or spray or neosporin. She says there are breaks in the patient's skin already and they want to avoid sepsis. Phone: [phone]

## 2022-01-04 NOTE — Telephone Encounter (Signed)
° °  Pt c/o swelling: STAT is pt has developed SOB within 24 hours  If swelling, where is the swelling located? Legs   How much weight have you gained and in what time span? No   Have you gained 3 pounds in a day or 5 pounds in a week? no  Do you have a log of your daily weights (if so, list)?   Are you currently taking a fluid pill? Yes   Are you currently SOB? No   Have you traveled recently? No  Pt's spouse calling and would like to speak with Hilda Blades regarding pt's swelling, they said to call them at [phone]

## 2022-01-04 NOTE — Telephone Encounter (Signed)
Spoke with Kayla Chambers, aware to just keep her legs clean and dry as much as possible.

## 2022-01-15 DIAGNOSIS — Z20822 Contact with and (suspected) exposure to covid-19: Secondary | ICD-10-CM | POA: Diagnosis not present

## 2022-01-17 DIAGNOSIS — M5459 Other low back pain: Secondary | ICD-10-CM | POA: Diagnosis not present

## 2022-01-22 DIAGNOSIS — M5416 Radiculopathy, lumbar region: Secondary | ICD-10-CM | POA: Diagnosis not present

## 2022-01-22 DIAGNOSIS — M5459 Other low back pain: Secondary | ICD-10-CM | POA: Diagnosis not present

## 2022-01-24 DIAGNOSIS — Z20828 Contact with and (suspected) exposure to other viral communicable diseases: Secondary | ICD-10-CM | POA: Diagnosis not present

## 2022-01-25 DIAGNOSIS — M5136 Other intervertebral disc degeneration, lumbar region: Secondary | ICD-10-CM | POA: Diagnosis not present

## 2022-01-25 DIAGNOSIS — Z79891 Long term (current) use of opiate analgesic: Secondary | ICD-10-CM | POA: Diagnosis not present

## 2022-01-31 NOTE — Patient Outreach (Signed)
[facility] [facility]) Care Management ? ?01/31/2022 ? ?Ander Slade ?10/27/1927 ?[id number] ? ? ?Received referral for Care Management from Insurance plan. Assigned patient to Valente David, RN Care Coordinator for follow. ? ? ?Philmore Pali ?[place] Management Assistant ?[phone] ? ?

## 2022-02-01 ENCOUNTER — Other Ambulatory Visit: Payer: Self-pay | Admitting: *Deleted

## 2022-02-01 NOTE — Patient Outreach (Signed)
[facility]) Care Management ? ?02/01/2022 ? ?Ander Slade ?06-06-1927 ?[id number] ? ? ?Referral Date: 3/15 ?Referral Source: Insurance ?Referral Reason: Chronic care management ?Insurance: BCBS ? ? ?Outreach attempt #1, successful.  Identity verified.  This care manager introduced self and stated purpose of call.  [facility] care management services explained.   ? ?Member's husband and daughter in law are both present for conversation.  She lives with member and husband, provides daily care and management of their health conditions.  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  Both husband and daughter in law decline the need for involvement at this time but they are open to receiving information on resources in the community should they need them in the future.  Denies any urgent concerns, encouraged to contact this care manager with questions.   ? ?Plan: ?RN CM will send this care manager's contact information as well as [facility] calendar that has list of community resources with it's contact information.  Will not open case at this time as family declined to participate.  ? ?Valente David, RN, MSN ?[facility] Care Management  ?Community Care Manager ?[phone] ? ?

## 2022-02-15 ENCOUNTER — Other Ambulatory Visit: Payer: Self-pay | Admitting: Cardiology

## 2022-02-15 DIAGNOSIS — I480 Paroxysmal atrial fibrillation: Secondary | ICD-10-CM

## 2022-02-16 DIAGNOSIS — Z20822 Contact with and (suspected) exposure to covid-19: Secondary | ICD-10-CM | POA: Diagnosis not present

## 2022-02-20 ENCOUNTER — Telehealth: Payer: Self-pay | Admitting: Cardiology

## 2022-02-20 NOTE — Telephone Encounter (Signed)
Called pt's husband. Pt c/o dizziness, had 1 episode of diarrhea. Daughter checked BP, 183/120. 30 mins ago checked it again: 153/105 most recent. Pt laying down. He states they have not been checking it regularly. But when they were in the 120s. Advised they should go to the Emergency Department since her blood pressure is elevated and she is not on any blood pressure medication. Pt's daughter states "she is not the mobile, it is hard to get her places." She also states we will continue to monitor it and call the doctor on call if need be. Pt's husband states "we just wanted to keep y'all in the loop." Will get message to Dr. Stanford Breed.  ?

## 2022-02-20 NOTE — Telephone Encounter (Signed)
Pt c/o BP issue: STAT if pt c/o blurred vision, one-sided weakness or slurred speech ? ?1. What are your last 5 BP readings? 183/120, after laying down 153/107 ? ?2. Are you having any other symptoms (ex. Dizziness, headache, blurred vision, passed out)? Dizziness even laying down, diarrhea  ? ?3. What is your BP issue? Patient's husband states the patient's BP has been very high today. Please advise.  ?

## 2022-02-21 NOTE — Telephone Encounter (Signed)
Spoke with pt husband, he reports she is feeling better today and her bp was 154/96. Before medications currently it is 121/72 and her oxygen level is 96% and pulse of 61.  ?

## 2022-02-22 DIAGNOSIS — Z20822 Contact with and (suspected) exposure to covid-19: Secondary | ICD-10-CM | POA: Diagnosis not present

## 2022-02-27 ENCOUNTER — Telehealth: Payer: Self-pay | Admitting: Cardiology

## 2022-02-27 MED ORDER — AMOXICILLIN 500 MG PO TABS: 2000.0000 mg | ORAL_TABLET | ORAL | 2 refills | Status: DC | PRN

## 2022-02-27 NOTE — Telephone Encounter (Signed)
Per Dr Stanford Breed notes (12- 26 -23 " status post AVR-continue SBE prophylaxis.") ? RN spoke to patient's husband-  Amoxicillin 2 gm   Rx prescription was sent to  pharmacy with 2 refills  ? ? ?Husband voiced understanding. ?

## 2022-02-27 NOTE — Telephone Encounter (Signed)
Pt c/o medication issue: ? ?1. Name of Medication: amoxicillin  ? ?2. How are you currently taking this medication (dosage and times per day)? Not currently taking  ? ?3. Are you having a reaction (difficulty breathing--STAT)? no ? ?4. What is your medication issue? Patient's husband states the patient has an appointment Thursday for the dentist and needs amoxicillin. ?

## 2022-03-05 DIAGNOSIS — Z20822 Contact with and (suspected) exposure to covid-19: Secondary | ICD-10-CM | POA: Diagnosis not present

## 2022-03-24 DIAGNOSIS — Z20822 Contact with and (suspected) exposure to covid-19: Secondary | ICD-10-CM | POA: Diagnosis not present

## 2022-03-26 DIAGNOSIS — Z20822 Contact with and (suspected) exposure to covid-19: Secondary | ICD-10-CM | POA: Diagnosis not present

## 2022-04-13 DIAGNOSIS — E7800 Pure hypercholesterolemia, unspecified: Secondary | ICD-10-CM | POA: Diagnosis not present

## 2022-04-13 DIAGNOSIS — I2510 Atherosclerotic heart disease of native coronary artery without angina pectoris: Secondary | ICD-10-CM | POA: Diagnosis not present

## 2022-04-13 DIAGNOSIS — R051 Acute cough: Secondary | ICD-10-CM | POA: Diagnosis not present

## 2022-04-13 DIAGNOSIS — I10 Essential (primary) hypertension: Secondary | ICD-10-CM | POA: Diagnosis not present

## 2022-04-13 DIAGNOSIS — E039 Hypothyroidism, unspecified: Secondary | ICD-10-CM | POA: Diagnosis not present

## 2022-04-13 DIAGNOSIS — I5032 Chronic diastolic (congestive) heart failure: Secondary | ICD-10-CM | POA: Diagnosis not present

## 2022-04-13 DIAGNOSIS — M810 Age-related osteoporosis without current pathological fracture: Secondary | ICD-10-CM | POA: Diagnosis not present

## 2022-04-18 DIAGNOSIS — K5903 Drug induced constipation: Secondary | ICD-10-CM | POA: Diagnosis not present

## 2022-04-19 DIAGNOSIS — G8929 Other chronic pain: Secondary | ICD-10-CM | POA: Diagnosis not present

## 2022-04-19 DIAGNOSIS — E7800 Pure hypercholesterolemia, unspecified: Secondary | ICD-10-CM | POA: Diagnosis not present

## 2022-04-19 DIAGNOSIS — I10 Essential (primary) hypertension: Secondary | ICD-10-CM | POA: Diagnosis not present

## 2022-04-19 DIAGNOSIS — E039 Hypothyroidism, unspecified: Secondary | ICD-10-CM | POA: Diagnosis not present

## 2022-04-19 DIAGNOSIS — I5032 Chronic diastolic (congestive) heart failure: Secondary | ICD-10-CM | POA: Diagnosis not present

## 2022-04-23 ENCOUNTER — Other Ambulatory Visit: Payer: Self-pay | Admitting: Cardiology

## 2022-04-23 DIAGNOSIS — I5032 Chronic diastolic (congestive) heart failure: Secondary | ICD-10-CM

## 2022-04-23 NOTE — Telephone Encounter (Signed)
*  STAT* If patient is at the pharmacy, call can be transferred to refill team.   1. Which medications need to be refilled? (please list name of each medication and dose if known) furosemide (LASIX) 40 MG tablet  2. Which pharmacy/location (including street and city if local pharmacy) is medication to be sent to? [facility] [phone] - [place], [place] - [address]  3. Do they need a 30 day or 90 day supply? [place]

## 2022-04-24 ENCOUNTER — Telehealth: Payer: Self-pay | Admitting: Cardiology

## 2022-04-24 DIAGNOSIS — I5032 Chronic diastolic (congestive) heart failure: Secondary | ICD-10-CM

## 2022-04-24 MED ORDER — FUROSEMIDE 20 MG PO TABS: 10.0000 mg | ORAL_TABLET | Freq: Every day | ORAL | 3 refills | Status: DC

## 2022-04-24 NOTE — Telephone Encounter (Signed)
Pt c/o medication issue:  1. Name of Medication: furosemide (LASIX) 20 MG tablet  2. How are you currently taking this medication (dosage and times per day)? Take 0.5 tablets (10 mg total) by mouth daily. and can take extra 1 tablet as needed for swelling  3. Are you having a reaction (difficulty breathing--STAT)? no  4. What is your medication issue? Husband called to say its suppose to be '40mg'$  not 20. Please advise

## 2022-04-24 NOTE — Telephone Encounter (Signed)
Per chart review, last time lasix dose was changed was hospital discharge furosemide 40 MG tablet Commonly known as: LASIX Take 1 tablet daily and can take extra 1 tablet as needed for swelling What changed:  how much to take how to take this when to take this additional instructions

## 2022-04-25 MED ORDER — FUROSEMIDE 40 MG PO TABS: 40.0000 mg | ORAL_TABLET | Freq: Every day | ORAL | 3 refills | Status: DC

## 2022-04-25 NOTE — Telephone Encounter (Signed)
Follow Up:    Patient's husband  is calling back.

## 2022-04-25 NOTE — Telephone Encounter (Signed)
Spoke with pt husband, she is taking 40 mg of furosemide once daily. Medication list updated. He will call back when refill needed.

## 2022-05-02 DIAGNOSIS — R300 Dysuria: Secondary | ICD-10-CM | POA: Diagnosis not present

## 2022-05-23 ENCOUNTER — Telehealth: Payer: Self-pay | Admitting: Cardiology

## 2022-05-23 ENCOUNTER — Telehealth: Payer: Self-pay

## 2022-05-23 DIAGNOSIS — I5032 Chronic diastolic (congestive) heart failure: Secondary | ICD-10-CM

## 2022-05-23 MED ORDER — FUROSEMIDE 40 MG PO TABS: 40.0000 mg | ORAL_TABLET | Freq: Two times a day (BID) | ORAL | 3 refills | Status: DC

## 2022-05-23 NOTE — Telephone Encounter (Signed)
Pt c/o swelling: STAT is pt has developed SOB within 24 hours  If swelling, where is the swelling located? LE  How much weight have you gained and in what time span? idk  Have you gained 3 pounds in a day or 5 pounds in a week? Yes   Do you have a log of your daily weights (if so, list)? No   Are you currently taking a fluid pill? Yes   Are you currently SOB? No   Have you traveled recently? No

## 2022-05-23 NOTE — Addendum Note (Signed)
Addended by: Cristopher Estimable on: 05/23/2022 04:50 PM   Modules accepted: Orders

## 2022-05-23 NOTE — Telephone Encounter (Signed)
Spouse wanted to verify lasix dose. Informed spouse that patient is to take furosemide '40mg'$  daily. Patient has bilateral leg edema that is weeping. Recommended to keep patient's legs clean and dry and elevate legs when sitting. Spouse voiced understanding.

## 2022-05-23 NOTE — Telephone Encounter (Signed)
Spoke with pt husband, Aware of dr Jacalyn Lefevre recommendations.  New script sent to the pharmacy  Order placed for labs

## 2022-05-29 DIAGNOSIS — N390 Urinary tract infection, site not specified: Secondary | ICD-10-CM | POA: Diagnosis not present

## 2022-05-29 DIAGNOSIS — I5032 Chronic diastolic (congestive) heart failure: Secondary | ICD-10-CM | POA: Diagnosis not present

## 2022-05-30 LAB — BASIC METABOLIC PANEL
BUN/Creatinine Ratio: 16 (ref 12–28)
BUN: 11 mg/dL (ref 10–36)
CO2: 24 mmol/L (ref 20–29)
Calcium: 9.3 mg/dL (ref 8.7–10.3)
Chloride: 96 mmol/L (ref 96–106)
Creatinine, Ser: 0.67 mg/dL (ref 0.57–1.00)
Glucose: 101 mg/dL — ABNORMAL HIGH (ref 70–99)
Potassium: 4.9 mmol/L (ref 3.5–5.2)
Sodium: 136 mmol/L (ref 134–144)
eGFR: 82 mL/min/{1.73_m2} (ref >59)

## 2022-06-25 DIAGNOSIS — N390 Urinary tract infection, site not specified: Secondary | ICD-10-CM | POA: Diagnosis not present

## 2022-07-24 DIAGNOSIS — G8929 Other chronic pain: Secondary | ICD-10-CM | POA: Diagnosis not present

## 2022-07-24 DIAGNOSIS — E7800 Pure hypercholesterolemia, unspecified: Secondary | ICD-10-CM | POA: Diagnosis not present

## 2022-07-24 DIAGNOSIS — E039 Hypothyroidism, unspecified: Secondary | ICD-10-CM | POA: Diagnosis not present

## 2022-07-24 DIAGNOSIS — I2510 Atherosclerotic heart disease of native coronary artery without angina pectoris: Secondary | ICD-10-CM | POA: Diagnosis not present

## 2022-07-24 DIAGNOSIS — I10 Essential (primary) hypertension: Secondary | ICD-10-CM | POA: Diagnosis not present

## 2022-07-24 DIAGNOSIS — I5032 Chronic diastolic (congestive) heart failure: Secondary | ICD-10-CM | POA: Diagnosis not present

## 2022-07-25 DIAGNOSIS — N390 Urinary tract infection, site not specified: Secondary | ICD-10-CM | POA: Diagnosis not present

## 2022-08-09 DIAGNOSIS — R3915 Urgency of urination: Secondary | ICD-10-CM | POA: Diagnosis not present

## 2022-08-09 DIAGNOSIS — R3914 Feeling of incomplete bladder emptying: Secondary | ICD-10-CM | POA: Diagnosis not present

## 2022-10-17 ENCOUNTER — Other Ambulatory Visit: Payer: Self-pay | Admitting: Cardiology

## 2022-10-17 DIAGNOSIS — I480 Paroxysmal atrial fibrillation: Secondary | ICD-10-CM

## 2022-10-18 NOTE — Telephone Encounter (Addendum)
Prescription refill request for Eliquis received. Indication: Afib  Last office visit: 09/13/21 Stanford Breed)  Scr: 0.67 (05/29/22)  Age: 88 Weight: 60.7kg  Overdue to see cardiologist. Pt has scheduled appt on 03/04/23 with Dr Stanford Breed.   Verified with Karren Cobble, PharmD, pt should remain on 2.'5mg'$  BID at this time. Refill sent to requested pharmacy.

## 2022-10-21 DIAGNOSIS — M4856XD Collapsed vertebra, not elsewhere classified, lumbar region, subsequent encounter for fracture with routine healing: Secondary | ICD-10-CM | POA: Diagnosis not present

## 2022-10-21 DIAGNOSIS — M5136 Other intervertebral disc degeneration, lumbar region: Secondary | ICD-10-CM | POA: Diagnosis not present

## 2022-10-24 DIAGNOSIS — Z5181 Encounter for therapeutic drug level monitoring: Secondary | ICD-10-CM | POA: Diagnosis not present

## 2022-10-24 DIAGNOSIS — Z79899 Other long term (current) drug therapy: Secondary | ICD-10-CM | POA: Diagnosis not present

## 2022-10-26 DIAGNOSIS — Z1331 Encounter for screening for depression: Secondary | ICD-10-CM | POA: Diagnosis not present

## 2022-10-26 DIAGNOSIS — G8929 Other chronic pain: Secondary | ICD-10-CM | POA: Diagnosis not present

## 2022-10-26 DIAGNOSIS — D6869 Other thrombophilia: Secondary | ICD-10-CM | POA: Diagnosis not present

## 2022-10-26 DIAGNOSIS — I700 Atherosclerosis of aorta: Secondary | ICD-10-CM | POA: Diagnosis not present

## 2022-10-26 DIAGNOSIS — G2581 Restless legs syndrome: Secondary | ICD-10-CM | POA: Diagnosis not present

## 2022-10-26 DIAGNOSIS — E039 Hypothyroidism, unspecified: Secondary | ICD-10-CM | POA: Diagnosis not present

## 2022-10-26 DIAGNOSIS — Z0000 Encounter for general adult medical examination without abnormal findings: Secondary | ICD-10-CM | POA: Diagnosis not present

## 2022-10-26 DIAGNOSIS — M810 Age-related osteoporosis without current pathological fracture: Secondary | ICD-10-CM | POA: Diagnosis not present

## 2022-10-26 DIAGNOSIS — F419 Anxiety disorder, unspecified: Secondary | ICD-10-CM | POA: Diagnosis not present

## 2022-10-26 DIAGNOSIS — R350 Frequency of micturition: Secondary | ICD-10-CM | POA: Diagnosis not present

## 2022-10-26 DIAGNOSIS — E7800 Pure hypercholesterolemia, unspecified: Secondary | ICD-10-CM | POA: Diagnosis not present

## 2022-10-26 DIAGNOSIS — E440 Moderate protein-calorie malnutrition: Secondary | ICD-10-CM | POA: Diagnosis not present

## 2022-11-12 ENCOUNTER — Other Ambulatory Visit: Payer: Self-pay | Admitting: Cardiology

## 2022-11-12 DIAGNOSIS — I480 Paroxysmal atrial fibrillation: Secondary | ICD-10-CM

## 2022-12-17 ENCOUNTER — Other Ambulatory Visit: Payer: Self-pay

## 2022-12-17 MED ORDER — POTASSIUM CHLORIDE CRYS ER 20 MEQ PO TBCR: 20.0000 meq | EXTENDED_RELEASE_TABLET | Freq: Every day | ORAL | 1 refills | Status: DC

## 2022-12-18 ENCOUNTER — Other Ambulatory Visit: Payer: Self-pay

## 2022-12-18 MED ORDER — POTASSIUM CHLORIDE CRYS ER 20 MEQ PO TBCR: 20.0000 meq | EXTENDED_RELEASE_TABLET | Freq: Every day | ORAL | 1 refills | Status: DC

## 2022-12-20 DIAGNOSIS — E7800 Pure hypercholesterolemia, unspecified: Secondary | ICD-10-CM | POA: Diagnosis not present

## 2022-12-20 DIAGNOSIS — E039 Hypothyroidism, unspecified: Secondary | ICD-10-CM | POA: Diagnosis not present

## 2022-12-20 DIAGNOSIS — G8929 Other chronic pain: Secondary | ICD-10-CM | POA: Diagnosis not present

## 2022-12-20 DIAGNOSIS — I2510 Atherosclerotic heart disease of native coronary artery without angina pectoris: Secondary | ICD-10-CM | POA: Diagnosis not present

## 2022-12-20 DIAGNOSIS — I10 Essential (primary) hypertension: Secondary | ICD-10-CM | POA: Diagnosis not present

## 2022-12-20 DIAGNOSIS — I5032 Chronic diastolic (congestive) heart failure: Secondary | ICD-10-CM | POA: Diagnosis not present

## 2023-01-21 ENCOUNTER — Other Ambulatory Visit: Payer: Self-pay

## 2023-01-21 ENCOUNTER — Encounter: Payer: Self-pay | Admitting: Cardiology

## 2023-01-21 DIAGNOSIS — M5136 Other intervertebral disc degeneration, lumbar region: Secondary | ICD-10-CM | POA: Diagnosis not present

## 2023-01-21 DIAGNOSIS — Z79899 Other long term (current) drug therapy: Secondary | ICD-10-CM | POA: Diagnosis not present

## 2023-01-21 MED ORDER — POTASSIUM CHLORIDE CRYS ER 20 MEQ PO TBCR: 20.0000 meq | EXTENDED_RELEASE_TABLET | Freq: Every day | ORAL | 0 refills | Status: DC

## 2023-01-21 NOTE — Telephone Encounter (Signed)
error 

## 2023-02-08 ENCOUNTER — Ambulatory Visit (INDEPENDENT_AMBULATORY_CARE_PROVIDER_SITE_OTHER): Payer: Medicare Other | Admitting: Podiatry

## 2023-02-08 ENCOUNTER — Encounter: Payer: Self-pay | Admitting: Podiatry

## 2023-02-08 DIAGNOSIS — M79674 Pain in right toe(s): Secondary | ICD-10-CM

## 2023-02-08 DIAGNOSIS — B351 Tinea unguium: Secondary | ICD-10-CM

## 2023-02-08 DIAGNOSIS — M79675 Pain in left toe(s): Secondary | ICD-10-CM

## 2023-02-09 NOTE — Progress Notes (Signed)
Subjective:   Patient ID: Kayla Chambers, female   DOB: 89 y.o.   MRN: [id number]   HPI Patient presents with caregiver with chronic nail disease 1-5 both feet no sensation and it is difficult for them to cut with her husband who is an elderly man having to do that and they are concerned because of neuropathy.  Patient does not smoke and is an active currently   Review of Systems  All other systems reviewed and are negative.       Objective:  Physical Exam Vitals and nursing note reviewed.  Constitutional:      Appearance: She is well-developed.  Pulmonary:     Effort: Pulmonary effort is normal.  Musculoskeletal:        General: Normal range of motion.  Skin:    General: Skin is warm.  Neurological:     Mental Status: She is alert.     Vascular status mildly diminished neurological mildly diminished diminished range of motion muscle strength commensurate with age with incurvated nailbeds 1-5 both feet that are thickened and they become tender in the corners and are very difficult for them to come up with neuropathy is complicating factor.  Good digital perfusion     Assessment:  Mycotic nail infection 1-5 both feet with neuropathy is complicating factor     Plan:  H&P educated the family on condition debrided nailbeds 1-5 both feet this can be done as needed no iatrogenic bleeding and questions answered concerning neuropathy nail disease

## 2023-02-11 ENCOUNTER — Telehealth: Payer: Self-pay | Admitting: Cardiology

## 2023-02-11 NOTE — Telephone Encounter (Signed)
Patient c/o Palpitations:  High priority if patient c/o lightheadedness, shortness of breath, or chest pain  How long have you had palpitations/irregular HR/ Afib? Are you having the symptoms now? yes  Are you currently experiencing lightheadedness, SOB or CP? no  Do you have a history of afib (atrial fibrillation) or irregular heart rhythm? no  Have you checked your BP or HR? (document readings if available):    Are you experiencing any other symptoms? Feel swelling

## 2023-02-11 NOTE — Telephone Encounter (Signed)
Spoke with patient's husband. She has had increased LE swelling for several days. She has been taking lasix once daily for a while but has increased to twice daily for a few days. Husband reports her BP is elevated also -- 150/80. Kayla Chambers indicates possible AFib (known PAF) -- noticed for several days. No weight gain. No chest pain. No shortness of breath.   Lasix Rx is written at 40mg  BID - can take extra tab daily as needed. She has not needed BID dosing for some time, as this episode is a change in her baseline.   Moved her 4/15 appt to 4/11 with MD in [place] (instead of [place]).  Routed message to MD to review.

## 2023-02-11 NOTE — Telephone Encounter (Signed)
Spoke with patient's husband. Explained that MD did not make any additional changes. Follow up as scheduled. Advised how to take lasix, based on Rx. Advised to stay hydrated, monitor UOP. Advised if she is taking lasix BID as prescribed and finds she needs 3rd PRN dose often, to contact office.

## 2023-02-21 NOTE — Progress Notes (Signed)
HPI: Follow-up PAF, CAD status post coronary artery bypass graft, Bentall/AVR and maze procedure. She underwent a Bentall procedure by Dr. Tyrone Sage on 03/31/13. 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 Preoperatively she had been in and out of atrial fibrillation. Carotid Dopplers March 2017 showed 1-39% bilateral stenosis. 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  Echocardiogram February 2022 showed normal LV function, mild left ventricular hypertrophy, moderate right ventricular enlargement, severe right atrial enlargement, mild to moderate tricuspid regurgitation, previous Bentall with aortic valve replacement and mean gradient 8 mmHg with no aortic insufficiency.  Also with history of paroxysmal atrial fibrillation.  Since last seen, she denies dyspnea, chest pain, palpitations or syncope.  However her pedal edema has worsened.  They checked her with Kardia mobile device at home and in the past several weeks atrial fibrillation has been suggested as possible.  Current Outpatient Medications  Medication Sig Dispense Refill   acetaminophen (TYLENOL) 500 MG tablet Take 1 tablet (500 mg total) by mouth every 6 (six) hours as needed for mild pain (pain.). 30 tablet 0   ALPRAZolam (XANAX) 0.5 MG tablet Take 0.5 mg by mouth at bedtime.     amiodarone (PACERONE) 200 MG tablet TAKE 1 TABLET(200 MG) BY MOUTH DAILY 90 tablet 3   apixaban (ELIQUIS) 2.5 MG TABS tablet TAKE 1 TABLET TWICE A DAY 180 tablet 3   furosemide (LASIX) 40 MG tablet Take 1 tablet (40 mg total) by mouth 2 (two) times daily. and can take extra 1 tablet as needed for swelling 180 tablet 3   lactase (LACTAID) 3000 UNITS tablet Take 1 tablet by mouth daily as needed (only when eating dairy).     levothyroxine (SYNTHROID, LEVOTHROID) 75 MCG tablet Take 75 mcg by mouth at bedtime.      Multiple Vitamin (MULTIVITAMIN WITH MINERALS) TABS tablet Take 1 tablet by mouth daily.     oxyCODONE-acetaminophen (PERCOCET/ROXICET) 5-325 MG tablet Take 1 tablet by mouth 3 (three) times daily as needed for moderate pain.     polyethylene glycol (MIRALAX / GLYCOLAX) 17 g packet Take 17 g by mouth daily as needed for mild constipation.     potassium chloride SA (KLOR-CON M) 20 MEQ tablet TAKE 1 TABLET BY MOUTH DAILY 30 tablet 0   pravastatin (PRAVACHOL) 40 MG tablet TAKE 1 TABLET(40 MG) BY MOUTH EVERY EVENING (Patient taking differently: Take 40 mg by mouth every other day.) 90 tablet 3   rOPINIRole (REQUIP) 0.25 MG tablet Take 0.25 mg by mouth at bedtime.     senna (SENOKOT) 8.6 MG tablet Take 2 tablets by mouth daily as needed for constipation.     No current facility-administered medications for this visit.     Past Medical History:  Diagnosis Date   Anxiety    takes xanax for sleep.    Aortic atherosclerosis 12/05/2021   Aortic insufficiency    a. 03/2013 s/p  AVR w/ biologic 66mm Magna Ease peric tissue valve, model 300TFX, ser# [id number].    Back pain    BACK BRACE/FRACTURE   CAD (coronary artery disease)    a. 01/2013 mod calcification w/o sev dzs;  b. 03/2013 CABG x1 VG->RCA @ time of AVR   Chronic diastolic CHF (congestive heart failure)    a. 01/2013 echo: EF 50-55%.   Compression fracture of body of thoracic vertebra    T-11  Dementia    Dilated aortic root    a. 03/2013 s/p Bentall procedure with Ao Root replacement (28mm Valsalva graft) w/ reimplantationof the R and L coronary ostium.   Dysrhythmia    A. 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 Neoadjuvant RT by Dr. Abelardo Diesel. Surveillance plan: MRI 12/08/11 shows changes in presumed post-op seroma/hematoma with slight  increase in size - follow up MRI 11/18/12 showed same size. Will continue with q3 month MRI. CT C/A/P annually alternating with CXR at 6 months.    Osteoporosis    Paroxysmal A-fib    a. Dx 01/2013 - placed on xarelto;  b. 03/2013 s/p left sided MAZE and LA clip @ time of AVR/CABG.   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   BLADDER SUSPENSION     CARDIOVERSION N/A 01/29/2017   Procedure: CARDIOVERSION;  Surgeon: Jake Bathe, MD;  Location: [facility] ENDOSCOPY;  Service: Cardiovascular;  Laterality: N/A;   CARDIOVERSION N/A 10/22/2017   Procedure: CARDIOVERSION;  Surgeon: Lewayne Bunting, MD;  Location: [facility] ENDOSCOPY;  Service: Cardiovascular;  Laterality: N/A;   COLONOSCOPY  10/25/2011   Procedure: COLONOSCOPY;  Surgeon: Charolett Bumpers, MD;  Location: [facility] ENDOSCOPY;  Service: Endoscopy;  Laterality: N/A;   EYE SURGERY Bilateral    cataracts   INTRAOPERATIVE TRANSESOPHAGEAL ECHOCARDIOGRAM N/A 03/31/2013   Procedure: INTRAOPERATIVE TRANSESOPHAGEAL ECHOCARDIOGRAM;  Surgeon: Delight Ovens, MD;  Location: [facility] OR;  Service: Open Heart Surgery;  Laterality: N/A;   KYPHOPLASTY N/A 05/07/2018   Procedure: KYPHOPLASTY LUMBAR THREE;  Surgeon: Venita Lick, MD;  Location: [facility] OR;  Service: Orthopedics;  Laterality: N/A;   KYPHOPLASTY N/A 05/14/2019   Procedure: KYPHOPLASTY T11;  Surgeon: Venita Lick, MD;  Location: [facility] OR;  Service: Orthopedics;  Laterality: N/A;  90 mins   KYPHOPLASTY N/A 07/08/2019   Procedure: KYPHOPLASTY THORACIC TWELVE;  Surgeon: Venita Lick, MD;   Location: [facility] OR;  Service: Orthopedics;  Laterality: N/A;  60 mins   LEFT AND RIGHT HEART CATHETERIZATION WITH CORONARY ANGIOGRAM Right 02/02/2013   Procedure: LEFT AND RIGHT HEART CATHETERIZATION WITH CORONARY ANGIOGRAM;  Surgeon: Herby Abraham, MD;  Location: [facility] CATH LAB;  Service: Cardiovascular;  Laterality: Right;   MAZE N/A 03/31/2013   Procedure: MAZE;  Surgeon: Delight Ovens, MD;  Location: [facility] OR;  Service: Open Heart Surgery;  Laterality: N/A;   Sarcoma resection  2011    Social History   Socioeconomic History   Marital status: Married    Spouse name: Not on file   Number of children: Not on file   Years of education: Not on file   Highest education level: Not on file  Occupational History   Not on file  Tobacco Use   Smoking status: Never   Smokeless tobacco: Never  Vaping Use   Vaping Use: Never used  Substance and Sexual Activity   Alcohol use: No   Drug use: No   Sexual activity: Yes    Birth control/protection: Post-menopausal  Other Topics Concern   Not on file  Social History Narrative   Not on file   Social Determinants of Health   Financial Resource Strain: Not on file  Food Insecurity: Not on file  Transportation Needs: Not on file  Physical Activity: Not on file  Stress: Not on file  Social Connections: Not on file  Intimate Partner Violence: Not on file    Family History  Problem Relation Age of Onset   Heart disease Mother    Stroke Mother    Arthritis Father    Cancer Father    Anesthesia problems Brother    Hypertension Sister     ROS: no fevers or chills, productive cough, hemoptysis, dysphasia, odynophagia, melena, hematochezia, dysuria, hematuria, rash, seizure activity, orthopnea, PND, claudication. Remaining systems are negative.  Physical Exam: Well-developed well-nourished in no acute distress.  Skin is warm and dry.  HEENT is normal.  Neck is supple.  Chest is clear to auscultation with normal expansion.   Cardiovascular exam is irregular Abdominal exam nontender or distended. No masses palpated. Extremities show 2+ edema. neuro grossly intact  ECG-atrial fibrillation at a rate of 82, right axis deviation, nonspecific ST changes.  Personally reviewed  A/P  1 paroxysmal atrial fibrillation-patient has developed recurrent atrial fibrillation.  She has worsening lower extremity edema.  I discussed the options today including rate control versus rhythm control.  She is unsure but would likely proceed with cardioversion if I felt appropriate.  This may help her maintain euvolemia.  Will continue amiodarone at present dose.  Increase apixaban to 5 mg twice daily.  I will see her back in 4 weeks and if atrial fibrillation persists and her lower extremity edema is unchanged we made attempt cardioversion to see if she would hold sinus.  Note this did help her edema in the past.  Check TSH, liver functions and chest x-ray.  Will also check hemoglobin and renal function.    2 coronary artery disease-no recurrent chest pain.  Continue statin.  She is not on aspirin given need for apixaban.  3 hypertension-blood pressure controlled.  Continue present medical regimen.  4 hyperlipidemia-continue statin.  Check lipids and liver.  5 status post aortic valve replacement-continue SBE prophylaxis.  6 chronic diastolic congestive heart failure-she is volume overloaded on examination today.  Will repeat echocardiogram to assess LV function.  Some of worsening CHF is likely related to recurrent atrial fibrillation.  Discontinue Lasix.  Treat with Demadex 40 mg in the morning and 20 mg in the afternoon.  Check potassium and renal function today and again in 1 week.  7 tricuspid regurgitation-conservative measures given age and overall medical condition.  Olga Millers, MD

## 2023-02-23 ENCOUNTER — Other Ambulatory Visit: Payer: Self-pay | Admitting: Cardiology

## 2023-02-28 ENCOUNTER — Encounter: Payer: Self-pay | Admitting: Cardiology

## 2023-02-28 ENCOUNTER — Ambulatory Visit: Payer: Medicare Other | Attending: Cardiology | Admitting: Cardiology

## 2023-02-28 VITALS — BP 128/82 | HR 82 | Ht 65.0 in | Wt 143.0 lb

## 2023-02-28 DIAGNOSIS — I480 Paroxysmal atrial fibrillation: Secondary | ICD-10-CM | POA: Diagnosis not present

## 2023-02-28 DIAGNOSIS — I359 Nonrheumatic aortic valve disorder, unspecified: Secondary | ICD-10-CM | POA: Diagnosis not present

## 2023-02-28 DIAGNOSIS — I2510 Atherosclerotic heart disease of native coronary artery without angina pectoris: Secondary | ICD-10-CM | POA: Insufficient documentation

## 2023-02-28 DIAGNOSIS — I5032 Chronic diastolic (congestive) heart failure: Secondary | ICD-10-CM | POA: Insufficient documentation

## 2023-02-28 MED ORDER — TORSEMIDE 20 MG PO TABS: 20.0000 mg | ORAL_TABLET | Freq: Two times a day (BID) | ORAL | 3 refills | Status: DC

## 2023-02-28 NOTE — Patient Instructions (Signed)
Medication Instructions:   STOP FUROSEMIDE  START TORSEMIDE 20 MG TWICE DAILY-TAKE 1 TABLET IN THE MORNING AND 1 TABLET AT 2 PM  *If you need a refill on your cardiac medications before your next appointment, please call your pharmacy*   Lab Work:  Your physician recommends that you return for lab work in:1 [facility]  If you have labs (blood work) drawn today and your tests are completely normal, you will receive your results only by: MyChart Message (if you have MyChart) OR A paper copy in the mail If you have any lab test that is abnormal or we need to change your treatment, we will call you to review the results.   Testing/Procedures:  Your physician has requested that you have an echocardiogram. Echocardiography is a painless test that uses sound waves to create images of your heart. It provides your doctor with information about the size and shape of your heart and how well your heart's chambers and valves are working. This procedure takes approximately one hour. There are no restrictions for this procedure. Please do NOT wear cologne, perfume, aftershave, or lotions (deodorant is allowed). Please arrive 15 minutes prior to your appointment time. [address]   Follow-Up: At [facility], you and your health needs are our priority.  As part of our continuing mission to provide you with exceptional heart care, we have created designated Provider Care Teams.  These Care Teams include your primary Cardiologist (physician) and Advanced Practice Providers (APPs -  Physician Assistants and Nurse Practitioners) who all work together to provide you with the care you need, when you need it.  We recommend signing up for the patient portal called "MyChart".  Sign up information is provided on this After Visit Summary.  MyChart is used to connect with patients for Virtual Visits (Telemedicine).  Patients are able to view lab/test results, encounter notes, upcoming  appointments, etc.  Non-urgent messages can be sent to your provider as well.   To learn more about what you can do with MyChart, go to [email or web address].    Your next appointment:   4 week(s)  Provider:   Olga Millers, MD

## 2023-03-01 ENCOUNTER — Other Ambulatory Visit: Payer: Self-pay | Admitting: *Deleted

## 2023-03-01 ENCOUNTER — Telehealth: Payer: Self-pay | Admitting: *Deleted

## 2023-03-01 DIAGNOSIS — I480 Paroxysmal atrial fibrillation: Secondary | ICD-10-CM

## 2023-03-01 LAB — BASIC METABOLIC PANEL
BUN/Creatinine Ratio: 22 (ref 12–28)
BUN: 17 mg/dL (ref 10–36)
CO2: 26 mmol/L (ref 20–29)
Calcium: 8.7 mg/dL (ref 8.7–10.3)
Chloride: 101 mmol/L (ref 96–106)
Creatinine, Ser: 0.78 mg/dL (ref 0.57–1.00)
Glucose: 89 mg/dL (ref 70–99)
Potassium: 5.5 mmol/L — ABNORMAL HIGH (ref 3.5–5.2)
Sodium: 141 mmol/L (ref 134–144)
eGFR: 71 mL/min/{1.73_m2} (ref >59)

## 2023-03-01 MED ORDER — APIXABAN 5 MG PO TABS
5.0000 mg | ORAL_TABLET | Freq: Two times a day (BID) | ORAL | 3 refills | Status: DC
Start: 2023-03-01 — End: 2024-01-23

## 2023-03-01 MED ORDER — APIXABAN 5 MG PO TABS
5.0000 mg | ORAL_TABLET | Freq: Two times a day (BID) | ORAL | 3 refills | Status: DC
Start: 2023-03-01 — End: 2023-03-01

## 2023-03-01 NOTE — Telephone Encounter (Signed)
-----   Message from Lewayne Bunting, MD sent at 03/01/2023  7:17 AM EDT ----- Increase apixaban to 5 mg BID; DC kcl; bmet one week as planned Olga Millers

## 2023-03-01 NOTE — Telephone Encounter (Signed)
Husband stated he was returning RN's call and can be reached at [phone].

## 2023-03-01 NOTE — Telephone Encounter (Signed)
pt aware of results  New script sent to the pharmacy  

## 2023-03-01 NOTE — Telephone Encounter (Signed)
Spoke with patient to clarify he has already spoken to the nurse regarding medication.  Nothing further needed

## 2023-03-04 ENCOUNTER — Ambulatory Visit: Payer: Medicare Other | Admitting: Cardiology

## 2023-03-08 DIAGNOSIS — I2510 Atherosclerotic heart disease of native coronary artery without angina pectoris: Secondary | ICD-10-CM | POA: Diagnosis not present

## 2023-03-08 DIAGNOSIS — I480 Paroxysmal atrial fibrillation: Secondary | ICD-10-CM | POA: Diagnosis not present

## 2023-03-09 LAB — COMPREHENSIVE METABOLIC PANEL
ALT: 12 IU/L (ref 0–32)
AST: 15 IU/L (ref 0–40)
Albumin/Globulin Ratio: 2 (ref 1.2–2.2)
Albumin: 4.1 g/dL (ref 3.6–4.6)
Alkaline Phosphatase: 75 IU/L (ref 44–121)
BUN/Creatinine Ratio: 20 (ref 12–28)
BUN: 20 mg/dL (ref 10–36)
Bilirubin Total: 0.3 mg/dL (ref 0.0–1.2)
CO2: 27 mmol/L (ref 20–29)
Calcium: 9.2 mg/dL (ref 8.7–10.3)
Chloride: 97 mmol/L (ref 96–106)
Creatinine, Ser: 0.98 mg/dL (ref 0.57–1.00)
Globulin, Total: 2.1 g/dL (ref 1.5–4.5)
Glucose: 87 mg/dL (ref 70–99)
Potassium: 4.5 mmol/L (ref 3.5–5.2)
Sodium: 141 mmol/L (ref 134–144)
Total Protein: 6.2 g/dL (ref 6.0–8.5)
eGFR: 54 mL/min/{1.73_m2} — ABNORMAL LOW (ref >59)

## 2023-03-09 LAB — LIPID PANEL
Chol/HDL Ratio: 2.8 ratio (ref 0.0–4.4)
Cholesterol, Total: 163 mg/dL (ref 100–199)
HDL: 59 mg/dL (ref >39)
LDL Chol Calc (NIH): 83 mg/dL (ref 0–99)
Triglycerides: 119 mg/dL (ref 0–149)
VLDL Cholesterol Cal: 21 mg/dL (ref 5–40)

## 2023-03-09 LAB — TSH: TSH: 8.12 u[IU]/mL — ABNORMAL HIGH (ref 0.450–4.500)

## 2023-03-11 ENCOUNTER — Telehealth: Payer: Self-pay | Admitting: *Deleted

## 2023-03-11 ENCOUNTER — Telehealth: Payer: Self-pay | Admitting: Cardiology

## 2023-03-11 DIAGNOSIS — E038 Other specified hypothyroidism: Secondary | ICD-10-CM

## 2023-03-11 DIAGNOSIS — I480 Paroxysmal atrial fibrillation: Secondary | ICD-10-CM

## 2023-03-11 NOTE — Telephone Encounter (Signed)
-----   Message from Lewayne Bunting, MD sent at 03/09/2023  9:20 AM EDT ----- Repeat TSH and free T4 12 weeks Olga Millers

## 2023-03-11 NOTE — Telephone Encounter (Signed)
Unable to reach pt or leave a message  

## 2023-03-11 NOTE — Telephone Encounter (Signed)
Patient's spouse called to talk with Dr. Jens Som or nurse. Would not go into detail about problem. Please call back

## 2023-03-11 NOTE — Telephone Encounter (Signed)
Patient husband had call earlier from [facility] care, unsure what is concerning. Gave message regarding Lab results.  He states understanding and will have them drawn at our office in 12 weks.  Labs ordered

## 2023-03-12 NOTE — Telephone Encounter (Signed)
pt aware of results Lab orders mailed to the pt  

## 2023-03-20 NOTE — H&P (View-Only) (Signed)
   HPI:  Follow-up PAF, CAD status post coronary artery bypass graft, Bentall/AVR and maze procedure. She underwent a Bentall procedure by Dr. Gerhardt on 03/31/13. 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 Preoperatively she had been in and out of atrial fibrillation. Carotid Dopplers March 2017 showed 1-39% bilateral stenosis. 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  Echocardiogram February 2022 showed normal LV function, mild left ventricular hypertrophy, moderate right ventricular enlargement, severe right atrial enlargement, mild to moderate tricuspid regurgitation, previous Bentall with aortic valve replacement and mean gradient 8 mmHg with no aortic insufficiency.  Also with history of paroxysmal atrial fibrillation.  Echo May 2024 showed normal LV function, severe biatrial enlargement, severe tricuspid regurgitation.Patient noted to be in recurrent atrial fibrillation at last office visit with worsening CHF.  Since last seen, she denies dyspnea, chest pain, palpitations or syncope.  Her pedal edema is worse.  Current Outpatient Medications  Medication Sig Dispense Refill   acetaminophen (TYLENOL) 500 MG tablet Take 1 tablet (500 mg total) by mouth every 6 (six) hours as needed for mild pain (pain.). 30 tablet 0   ALPRAZolam (XANAX) 0.5 MG tablet Take 0.5 mg by mouth at bedtime.     amiodarone (PACERONE) 200 MG tablet TAKE 1 TABLET(200 MG) BY MOUTH DAILY 90 tablet 3   apixaban (ELIQUIS) 5 MG TABS tablet Take 1 tablet (5 mg total) by mouth 2 (two) times daily. 180 tablet 3   lactase (LACTAID) 3000 UNITS tablet Take 1 tablet by mouth daily as needed (only when eating dairy).     levothyroxine (SYNTHROID, LEVOTHROID) 75 MCG tablet Take 75 mcg by mouth at bedtime.     Multiple Vitamin (MULTIVITAMIN WITH MINERALS) TABS tablet Take 1 tablet by  mouth daily.     oxyCODONE-acetaminophen (PERCOCET) 7.5-325 MG tablet Take 1 tablet by mouth every 4 (four) hours.     polyethylene glycol (MIRALAX / GLYCOLAX) 17 g packet Take 17 g by mouth daily.     pravastatin (PRAVACHOL) 40 MG tablet Take 40 mg by mouth every other day.     rOPINIRole (REQUIP) 0.25 MG tablet Take 0.25 mg by mouth at bedtime.     senna (SENOKOT) 8.6 MG tablet Take 2 tablets by mouth in the morning.     torsemide (DEMADEX) 20 MG tablet Take 1 tablet (20 mg total) by mouth 2 (two) times daily. 180 tablet 3   No current facility-administered medications for this visit.     Past Medical History:  Diagnosis Date   Anxiety    takes xanax for sleep.    Aortic atherosclerosis (HCC) 12/05/2021   Aortic insufficiency    a. 03/2013 s/p  AVR w/ biologic 25mm Magna Ease peric tissue valve, model 300TFX, ser# [id number].    Back pain    BACK BRACE/FRACTURE   CAD (coronary artery disease)    a. 01/2013 mod calcification w/o sev dzs;  b. 03/2013 CABG x1 VG->RCA @ time of AVR   Chronic diastolic CHF (congestive heart failure) (HCC)    a. 01/2013 echo: EF 50-55%.   Compression fracture of body of thoracic vertebra (HCC)    T-11   Dementia (HCC)    Dilated aortic root (HCC)    a. 03/2013 s/p Bentall procedure with Ao Root replacement (28mm Valsalva graft) w/ reimplantationof the R and L coronary ostium.   Dysrhythmia      A. 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 Neoadjuvant RT by Dr. McMullen. Surveillance plan: MRI 12/08/11 shows changes in presumed post-op seroma/hematoma with slight increase in size - follow up MRI 11/18/12 showed same size. Will continue with q3 month MRI. CT C/A/P annually alternating with CXR at 6 months.    Osteoporosis    Paroxysmal A-fib (HCC)     a. Dx 01/2013 - placed on xarelto;  b. 03/2013 s/p left sided MAZE and LA clip @ time of AVR/CABG.   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HCC)    radiation & left lower extremity s/p resection in Feb 2012   Vertigo     Past Surgical History:  Procedure Laterality Date   ABDOMINAL HYSTERECTOMY     ASCENDING AORTIC ROOT REPLACEMENT N/A 03/31/2013   Procedure: ASCENDING AORTIC ROOT REPLACEMENT;  Surgeon: Edward B Gerhardt, MD;  Location: [facility] OR;  Service: Open Heart Surgery;  Laterality: N/A; clip inserted.   BLADDER SUSPENSION     CARDIOVERSION N/A 01/29/2017   Procedure: CARDIOVERSION;  Surgeon: Mark C Skains, MD;  Location: [facility] ENDOSCOPY;  Service: Cardiovascular;  Laterality: N/A;   CARDIOVERSION N/A 10/22/2017   Procedure: CARDIOVERSION;  Surgeon: Navayah Sok S, MD;  Location: [facility] ENDOSCOPY;  Service: Cardiovascular;  Laterality: N/A;   COLONOSCOPY  10/25/2011   Procedure: COLONOSCOPY;  Surgeon: Martin K Johnson, MD;  Location: [facility] ENDOSCOPY;  Service: Endoscopy;  Laterality: N/A;   EYE SURGERY Bilateral    cataracts   INTRAOPERATIVE TRANSESOPHAGEAL ECHOCARDIOGRAM N/A 03/31/2013   Procedure: INTRAOPERATIVE TRANSESOPHAGEAL ECHOCARDIOGRAM;  Surgeon: Edward B Gerhardt, MD;  Location: [facility] OR;  Service: Open Heart Surgery;  Laterality: N/A;   KYPHOPLASTY N/A 05/07/2018   Procedure: KYPHOPLASTY LUMBAR THREE;  Surgeon: Brooks, Dahari, MD;  Location: [facility] OR;  Service: Orthopedics;  Laterality: N/A;   KYPHOPLASTY N/A 05/14/2019   Procedure: KYPHOPLASTY T11;  Surgeon: Brooks, Dahari, MD;  Location: [facility] OR;  Service: Orthopedics;  Laterality: N/A;  90 mins   KYPHOPLASTY N/A 07/08/2019   Procedure: KYPHOPLASTY THORACIC TWELVE;  Surgeon: Brooks, Dahari, MD;  Location: [facility] OR;  Service: Orthopedics;  Laterality: N/A;  60 mins   LEFT AND RIGHT HEART CATHETERIZATION WITH CORONARY ANGIOGRAM Right 02/02/2013   Procedure: LEFT AND RIGHT HEART  CATHETERIZATION WITH CORONARY ANGIOGRAM;  Surgeon: Thomas D Stuckey, MD;  Location: [facility] CATH LAB;  Service: Cardiovascular;  Laterality: Right;   MAZE N/A 03/31/2013   Procedure: MAZE;  Surgeon: Edward B Gerhardt, MD;  Location: [facility] OR;  Service: Open Heart Surgery;  Laterality: N/A;   Sarcoma resection  2011    Social History   Socioeconomic History   Marital status: Married    Spouse name: Not on file   Number of children: Not on file   Years of education: Not on file   Highest education level: Not on file  Occupational History   Not on file  Tobacco Use   Smoking status: Never   Smokeless tobacco: Never  Vaping Use   Vaping Use: Never used  Substance and Sexual Activity   Alcohol use: No   Drug use: No   Sexual activity: Yes      Birth control/protection: Post-menopausal  Other Topics Concern   Not on file  Social History Narrative   Not on file   Social Determinants of Health   Financial Resource Strain: Not on file  Food Insecurity: Not on file  Transportation Needs: Not on file  Physical Activity: Not on file  Stress: Not on file  Social Connections: Not on file  Intimate Partner Violence: Not on file    Family History  Problem Relation Age of Onset   Heart disease Mother    Stroke Mother    Arthritis Father    Cancer Father    Anesthesia problems Brother    Hypertension Sister     ROS: no fevers or chills, productive cough, hemoptysis, dysphasia, odynophagia, melena, hematochezia, dysuria, hematuria, rash, seizure activity, orthopnea, PND, claudication. Remaining systems are negative.  Physical Exam: Well-developed frail in no acute distress.  Skin is warm and dry.  HEENT is normal.  Neck is supple.  Chest is clear to auscultation with normal expansion.  Cardiovascular exam is irregular Abdominal exam nontender or distended. No masses palpated. Extremities show 3+ edema. neuro grossly intact  ECG-atrial fibrillation at a rate of 77, cannot rule out  septal infarct, nonspecific ST changes.  Personally reviewed  A/P  1 persistent atrial fibrillation-patient remains in atrial fibrillation today.  Her heart rate is controlled.  Continue amiodarone and apixaban (she has been on 5 mg twice daily for greater than 3 weeks).  Her pedal edema is worse and this is similar to presentation with atrial fibrillation/CHF in the past.  I discussed options today.  I think if we can maintain sinus rhythm her edema would be much better.  She is in agreement.  She held sinus rhythm for several years following her last cardioversion.  2 coronary artery disease-patient denies chest pain.  Continue statin.  She is not on aspirin given need for anticoagulation.  3 hyperlipidemia-continue statin.  4 hypertension-blood pressure controlled.  Continue present medications.  5 status post AVR-continue SBE prophylaxis.  6 acute on chronic diastolic congestive heart failure-she is volume overloaded on examination.  Will increase Demadex to 40 mg in the morning and 20 mg in the evening.  Hopefully this will improve if we reestablish sinus rhythm.  Will check potassium and renal function today and again in 1 week.  7 tricuspid regurgitation-conservative measures given patient's age and medical condition.  Akirra Lacerda, MD    

## 2023-03-20 NOTE — Progress Notes (Signed)
HPI:  Follow-up PAF, CAD status post coronary artery bypass graft, Bentall/AVR and maze procedure. She underwent a Bentall procedure by Dr. Tyrone Sage on 03/31/13. 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 Preoperatively she had been in and out of atrial fibrillation. Carotid Dopplers March 2017 showed 1-39% bilateral stenosis. 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  Echocardiogram February 2022 showed normal LV function, mild left ventricular hypertrophy, moderate right ventricular enlargement, severe right atrial enlargement, mild to moderate tricuspid regurgitation, previous Bentall with aortic valve replacement and mean gradient 8 mmHg with no aortic insufficiency.  Also with history of paroxysmal atrial fibrillation.  Echo May 2024 showed normal LV function, severe biatrial enlargement, severe tricuspid regurgitation.Patient noted to be in recurrent atrial fibrillation at last office visit with worsening CHF.  Since last seen, she denies dyspnea, chest pain, palpitations or syncope.  Her pedal edema is worse.  Current Outpatient Medications  Medication Sig Dispense Refill   acetaminophen (TYLENOL) 500 MG tablet Take 1 tablet (500 mg total) by mouth every 6 (six) hours as needed for mild pain (pain.). 30 tablet 0   ALPRAZolam (XANAX) 0.5 MG tablet Take 0.5 mg by mouth at bedtime.     amiodarone (PACERONE) 200 MG tablet TAKE 1 TABLET(200 MG) BY MOUTH DAILY 90 tablet 3   apixaban (ELIQUIS) 5 MG TABS tablet Take 1 tablet (5 mg total) by mouth 2 (two) times daily. 180 tablet 3   lactase (LACTAID) 3000 UNITS tablet Take 1 tablet by mouth daily as needed (only when eating dairy).     levothyroxine (SYNTHROID, LEVOTHROID) 75 MCG tablet Take 75 mcg by mouth at bedtime.     Multiple Vitamin (MULTIVITAMIN WITH MINERALS) TABS tablet Take 1 tablet by  mouth daily.     oxyCODONE-acetaminophen (PERCOCET) 7.5-325 MG tablet Take 1 tablet by mouth every 4 (four) hours.     polyethylene glycol (MIRALAX / GLYCOLAX) 17 g packet Take 17 g by mouth daily.     pravastatin (PRAVACHOL) 40 MG tablet Take 40 mg by mouth every other day.     rOPINIRole (REQUIP) 0.25 MG tablet Take 0.25 mg by mouth at bedtime.     senna (SENOKOT) 8.6 MG tablet Take 2 tablets by mouth in the morning.     torsemide (DEMADEX) 20 MG tablet Take 1 tablet (20 mg total) by mouth 2 (two) times daily. 180 tablet 3   No current facility-administered medications for this visit.     Past Medical History:  Diagnosis Date   Anxiety    takes xanax for sleep.    Aortic atherosclerosis (HCC) 12/05/2021   Aortic insufficiency    a. 03/2013 s/p  AVR w/ biologic 25mm Magna Ease peric tissue valve, model 300TFX, ser# [id number].    Back pain    BACK BRACE/FRACTURE   CAD (coronary artery disease)    a. 01/2013 mod calcification w/o sev dzs;  b. 03/2013 CABG x1 VG->RCA @ time of AVR   Chronic diastolic CHF (congestive heart failure) (HCC)    a. 01/2013 echo: EF 50-55%.   Compression fracture of body of thoracic vertebra (HCC)    T-11   Dementia (HCC)    Dilated aortic root (HCC)    a. 03/2013 s/p Bentall procedure with Ao Root replacement (28mm Valsalva graft) w/ reimplantationof the R and L coronary ostium.   Dysrhythmia  A. 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 Neoadjuvant RT by Dr. Abelardo Diesel. Surveillance plan: MRI 12/08/11 shows changes in presumed post-op seroma/hematoma with slight increase in size - follow up MRI 11/18/12 showed same size. Will continue with q3 month MRI. CT C/A/P annually alternating with CXR at 6 months.    Osteoporosis    Paroxysmal A-fib (HCC)     a. Dx 01/2013 - placed on xarelto;  b. 03/2013 s/p left sided MAZE and LA clip @ time of AVR/CABG.   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   BLADDER SUSPENSION     CARDIOVERSION N/A 01/29/2017   Procedure: CARDIOVERSION;  Surgeon: Jake Bathe, MD;  Location: [facility] ENDOSCOPY;  Service: Cardiovascular;  Laterality: N/A;   CARDIOVERSION N/A 10/22/2017   Procedure: CARDIOVERSION;  Surgeon: Lewayne Bunting, MD;  Location: [facility] ENDOSCOPY;  Service: Cardiovascular;  Laterality: N/A;   COLONOSCOPY  10/25/2011   Procedure: COLONOSCOPY;  Surgeon: Charolett Bumpers, MD;  Location: [facility] ENDOSCOPY;  Service: Endoscopy;  Laterality: N/A;   EYE SURGERY Bilateral    cataracts   INTRAOPERATIVE TRANSESOPHAGEAL ECHOCARDIOGRAM N/A 03/31/2013   Procedure: INTRAOPERATIVE TRANSESOPHAGEAL ECHOCARDIOGRAM;  Surgeon: Delight Ovens, MD;  Location: [facility] OR;  Service: Open Heart Surgery;  Laterality: N/A;   KYPHOPLASTY N/A 05/07/2018   Procedure: KYPHOPLASTY LUMBAR THREE;  Surgeon: Venita Lick, MD;  Location: [facility] OR;  Service: Orthopedics;  Laterality: N/A;   KYPHOPLASTY N/A 05/14/2019   Procedure: KYPHOPLASTY T11;  Surgeon: Venita Lick, MD;  Location: [facility] OR;  Service: Orthopedics;  Laterality: N/A;  90 mins   KYPHOPLASTY N/A 07/08/2019   Procedure: KYPHOPLASTY THORACIC TWELVE;  Surgeon: Venita Lick, MD;  Location: [facility] OR;  Service: Orthopedics;  Laterality: N/A;  60 mins   LEFT AND RIGHT HEART CATHETERIZATION WITH CORONARY ANGIOGRAM Right 02/02/2013   Procedure: LEFT AND RIGHT HEART  CATHETERIZATION WITH CORONARY ANGIOGRAM;  Surgeon: Herby Abraham, MD;  Location: [facility] CATH LAB;  Service: Cardiovascular;  Laterality: Right;   MAZE N/A 03/31/2013   Procedure: MAZE;  Surgeon: Delight Ovens, MD;  Location: [facility] OR;  Service: Open Heart Surgery;  Laterality: N/A;   Sarcoma resection  2011    Social History   Socioeconomic History   Marital status: Married    Spouse name: Not on file   Number of children: Not on file   Years of education: Not on file   Highest education level: Not on file  Occupational History   Not on file  Tobacco Use   Smoking status: Never   Smokeless tobacco: Never  Vaping Use   Vaping Use: Never used  Substance and Sexual Activity   Alcohol use: No   Drug use: No   Sexual activity: Yes  Birth control/protection: Post-menopausal  Other Topics Concern   Not on file  Social History Narrative   Not on file   Social Determinants of Health   Financial Resource Strain: Not on file  Food Insecurity: Not on file  Transportation Needs: Not on file  Physical Activity: Not on file  Stress: Not on file  Social Connections: Not on file  Intimate Partner Violence: Not on file    Family History  Problem Relation Age of Onset   Heart disease Mother    Stroke Mother    Arthritis Father    Cancer Father    Anesthesia problems Brother    Hypertension Sister     ROS: no fevers or chills, productive cough, hemoptysis, dysphasia, odynophagia, melena, hematochezia, dysuria, hematuria, rash, seizure activity, orthopnea, PND, claudication. Remaining systems are negative.  Physical Exam: Well-developed frail in no acute distress.  Skin is warm and dry.  HEENT is normal.  Neck is supple.  Chest is clear to auscultation with normal expansion.  Cardiovascular exam is irregular Abdominal exam nontender or distended. No masses palpated. Extremities show 3+ edema. neuro grossly intact  ECG-atrial fibrillation at a rate of 77, cannot rule out  septal infarct, nonspecific ST changes.  Personally reviewed  A/P  1 persistent atrial fibrillation-patient remains in atrial fibrillation today.  Her heart rate is controlled.  Continue amiodarone and apixaban (she has been on 5 mg twice daily for greater than 3 weeks).  Her pedal edema is worse and this is similar to presentation with atrial fibrillation/CHF in the past.  I discussed options today.  I think if we can maintain sinus rhythm her edema would be much better.  She is in agreement.  She held sinus rhythm for several years following her last cardioversion.  2 coronary artery disease-patient denies chest pain.  Continue statin.  She is not on aspirin given need for anticoagulation.  3 hyperlipidemia-continue statin.  4 hypertension-blood pressure controlled.  Continue present medications.  5 status post AVR-continue SBE prophylaxis.  6 acute on chronic diastolic congestive heart failure-she is volume overloaded on examination.  Will increase Demadex to 40 mg in the morning and 20 mg in the evening.  Hopefully this will improve if we reestablish sinus rhythm.  Will check potassium and renal function today and again in 1 week.  7 tricuspid regurgitation-conservative measures given patient's age and medical condition.  Olga Millers, MD

## 2023-03-21 ENCOUNTER — Ambulatory Visit (HOSPITAL_COMMUNITY): Payer: Medicare Other | Attending: Cardiology

## 2023-03-21 DIAGNOSIS — I5032 Chronic diastolic (congestive) heart failure: Secondary | ICD-10-CM | POA: Diagnosis not present

## 2023-03-21 DIAGNOSIS — I480 Paroxysmal atrial fibrillation: Secondary | ICD-10-CM | POA: Diagnosis not present

## 2023-03-22 LAB — ECHOCARDIOGRAM COMPLETE
Area-P 1/2: 4.54 cm2
S' Lateral: 1.7 cm

## 2023-03-27 ENCOUNTER — Ambulatory Visit: Payer: Medicare Other | Attending: Cardiology | Admitting: Cardiology

## 2023-03-27 ENCOUNTER — Encounter: Payer: Self-pay | Admitting: Cardiology

## 2023-03-27 VITALS — BP 118/80 | HR 77 | Ht 66.0 in | Wt 134.0 lb

## 2023-03-27 DIAGNOSIS — I10 Essential (primary) hypertension: Secondary | ICD-10-CM

## 2023-03-27 DIAGNOSIS — I2510 Atherosclerotic heart disease of native coronary artery without angina pectoris: Secondary | ICD-10-CM

## 2023-03-27 DIAGNOSIS — I480 Paroxysmal atrial fibrillation: Secondary | ICD-10-CM | POA: Diagnosis not present

## 2023-03-27 DIAGNOSIS — I5032 Chronic diastolic (congestive) heart failure: Secondary | ICD-10-CM | POA: Diagnosis not present

## 2023-03-27 MED ORDER — TORSEMIDE 20 MG PO TABS
ORAL_TABLET | ORAL | 3 refills | Status: DC
Start: 2023-03-27 — End: 2023-04-17

## 2023-03-27 NOTE — Patient Instructions (Signed)
Medication Instructions:   INCREASE TORSEMIDE TO 40 MG IN THE MORNING AND 20 MG IN THE AFTERNOON=2 TABLETS IN THE MORNING AND 1 TABLET IN THE AFTERNOON  *If you need a refill on your cardiac medications before your next appointment, please call your pharmacy*   Lab Work:  Your physician recommends that you return for lab work TODAY AND IN ONE WEEK  If you have labs (blood work) drawn today and your tests are completely normal, you will receive your results only by: MyChart Message (if you have MyChart) OR A paper copy in the mail If you have any lab test that is abnormal or we need to change your treatment, we will call you to review the results.   Testing/Procedures:    You are scheduled for a Cardioversion on Friday, May 10 with Dr. Tenny Craw.  Please arrive at the [facility] (Main Entrance A) at [facility]: [address] [place], [place] [address] at 9:00 AM (This time is 1 hour(s) before your procedure to ensure your preparation). Free valet parking service is available. You will check in at ADMITTING. The support person will be asked to wait in the waiting room.  It is OK to have someone drop you off and come back when you are ready to be discharged.     DIET:  Nothing to eat or drink after midnight except a sip of water with medications (see medication instructions below)  MEDICATION INSTRUCTIONS:        Continue taking your anticoagulant (blood thinner): Apixaban (Eliquis).  You will need to continue this after your procedure until you are told by your provider that it is safe to stop.    FYI:  For your safety, and to allow [place] to monitor your vital signs accurately during the surgery/procedure we request: If you have artificial nails, gel coating, SNS etc, please have those removed prior to your surgery/procedure. Not having the nail coverings /polish removed may result in cancellation or delay of your surgery/procedure.  You must have a responsible person to drive you  home and stay in the waiting area during your procedure. Failure to do so could result in cancellation.  Bring your insurance cards.  *Special Note: Every effort is made to have your procedure done on time. Occasionally there are emergencies that occur at the hospital that may cause delays. Please be patient if a delay does occur.      Follow-Up: At [facility], you and your health needs are our priority.  As part of our continuing mission to provide you with exceptional heart care, we have created designated Provider Care Teams.  These Care Teams include your primary Cardiologist (physician) and Advanced Practice Providers (APPs -  Physician Assistants and Nurse Practitioners) who all work together to provide you with the care you need, when you need it.  We recommend signing up for the patient portal called "MyChart".  Sign up information is provided on this After Visit Summary.  MyChart is used to connect with patients for Virtual Visits (Telemedicine).  Patients are able to view lab/test results, encounter notes, upcoming appointments, etc.  Non-urgent messages can be sent to your provider as well.   To learn more about what you can do with MyChart, go to [email or web address].    Your next appointment:   8 week(s)  Provider:   Olga Millers, MD

## 2023-03-28 LAB — BASIC METABOLIC PANEL
BUN/Creatinine Ratio: 24 (ref 12–28)
BUN: 21 mg/dL (ref 10–36)
CO2: 25 mmol/L (ref 20–29)
Calcium: 8.9 mg/dL (ref 8.7–10.3)
Chloride: 100 mmol/L (ref 96–106)
Creatinine, Ser: 0.89 mg/dL (ref 0.57–1.00)
Glucose: 87 mg/dL (ref 70–99)
Potassium: 4.9 mmol/L (ref 3.5–5.2)
Sodium: 142 mmol/L (ref 134–144)
eGFR: 60 mL/min/{1.73_m2} (ref >59)

## 2023-03-28 LAB — CBC
Hematocrit: 35.2 % (ref 34.0–46.6)
Hemoglobin: 12.3 g/dL (ref 11.1–15.9)
MCH: 35.1 pg — ABNORMAL HIGH (ref 26.6–33.0)
MCHC: 34.9 g/dL (ref 31.5–35.7)
MCV: 101 fL — ABNORMAL HIGH (ref 79–97)
Platelets: 117 10*3/uL — ABNORMAL LOW (ref 150–450)
RBC: 3.5 x10E6/uL — ABNORMAL LOW (ref 3.77–5.28)
RDW: 12.9 % (ref 11.7–15.4)
WBC: 4.9 10*3/uL (ref 3.4–10.8)

## 2023-03-28 NOTE — Pre-Procedure Instructions (Signed)
Spoke with Joyce Gross, pt's husband, regarding cardioversion tomorrow - arrive at 0900, NPO after midnight tonight, take pills in the AM with sip of water, confirmed patient hasn't missed any doses of eliquis, confirmed patient will have ride home and responsible person to stay with patient for 24 hours after procedure

## 2023-03-29 ENCOUNTER — Other Ambulatory Visit: Payer: Self-pay

## 2023-03-29 ENCOUNTER — Encounter (HOSPITAL_COMMUNITY)
Admission: RE | Disposition: A | Discharge status: Home / Self Care | Payer: Self-pay | Source: Home / Self Care | Attending: Internal Medicine

## 2023-03-29 ENCOUNTER — Ambulatory Visit (HOSPITAL_COMMUNITY)
Admission: RE | Admit: 2023-03-29 | Discharge: 2023-03-29 | Disposition: A | Discharge status: Home / Self Care | Payer: Medicare Other | Attending: Internal Medicine | Admitting: Internal Medicine

## 2023-03-29 ENCOUNTER — Encounter: Payer: Self-pay | Admitting: *Deleted

## 2023-03-29 ENCOUNTER — Encounter (HOSPITAL_COMMUNITY): Payer: Self-pay | Admitting: Internal Medicine

## 2023-03-29 ENCOUNTER — Ambulatory Visit (HOSPITAL_COMMUNITY): Payer: Medicare Other | Admitting: Anesthesiology

## 2023-03-29 ENCOUNTER — Ambulatory Visit (HOSPITAL_BASED_OUTPATIENT_CLINIC_OR_DEPARTMENT_OTHER): Payer: Medicare Other | Admitting: Anesthesiology

## 2023-03-29 DIAGNOSIS — Z951 Presence of aortocoronary bypass graft: Secondary | ICD-10-CM | POA: Diagnosis not present

## 2023-03-29 DIAGNOSIS — I2510 Atherosclerotic heart disease of native coronary artery without angina pectoris: Secondary | ICD-10-CM | POA: Insufficient documentation

## 2023-03-29 DIAGNOSIS — I4819 Other persistent atrial fibrillation: Secondary | ICD-10-CM | POA: Insufficient documentation

## 2023-03-29 DIAGNOSIS — I10 Essential (primary) hypertension: Secondary | ICD-10-CM

## 2023-03-29 DIAGNOSIS — I4891 Unspecified atrial fibrillation: Secondary | ICD-10-CM

## 2023-03-29 DIAGNOSIS — Z79899 Other long term (current) drug therapy: Secondary | ICD-10-CM | POA: Insufficient documentation

## 2023-03-29 DIAGNOSIS — I5033 Acute on chronic diastolic (congestive) heart failure: Secondary | ICD-10-CM | POA: Diagnosis not present

## 2023-03-29 DIAGNOSIS — E785 Hyperlipidemia, unspecified: Secondary | ICD-10-CM | POA: Insufficient documentation

## 2023-03-29 DIAGNOSIS — Z952 Presence of prosthetic heart valve: Secondary | ICD-10-CM | POA: Diagnosis not present

## 2023-03-29 DIAGNOSIS — E039 Hypothyroidism, unspecified: Secondary | ICD-10-CM | POA: Diagnosis not present

## 2023-03-29 DIAGNOSIS — I110 Hypertensive heart disease with heart failure: Secondary | ICD-10-CM | POA: Insufficient documentation

## 2023-03-29 DIAGNOSIS — Z7901 Long term (current) use of anticoagulants: Secondary | ICD-10-CM | POA: Insufficient documentation

## 2023-03-29 DIAGNOSIS — I082 Rheumatic disorders of both aortic and tricuspid valves: Secondary | ICD-10-CM | POA: Diagnosis not present

## 2023-03-29 DIAGNOSIS — F419 Anxiety disorder, unspecified: Secondary | ICD-10-CM | POA: Diagnosis not present

## 2023-03-29 HISTORY — PX: CARDIOVERSION: SHX1299

## 2023-03-29 SURGERY — CARDIOVERSION
Anesthesia: General

## 2023-03-29 MED ORDER — SODIUM CHLORIDE 0.9 % IV SOLN: INTRAVENOUS | Status: DC | PRN

## 2023-03-29 MED ORDER — LIDOCAINE 2% (20 MG/ML) 5 ML SYRINGE
INTRAMUSCULAR | Status: DC | PRN
  Administered 2023-03-29: 20 mg via INTRAVENOUS

## 2023-03-29 MED ORDER — PROPOFOL 10 MG/ML IV BOLUS
INTRAVENOUS | Status: DC | PRN
  Administered 2023-03-29: 60 mg via INTRAVENOUS

## 2023-03-29 SURGICAL SUPPLY — 1 items: ELECT DEFIB PAD ADLT CADENCE (PAD) ×1 IMPLANT

## 2023-03-29 NOTE — Transfer of Care (Signed)
Immediate Anesthesia Transfer of Care Note  Patient: Kayla Chambers  Procedure(s) Performed: CARDIOVERSION  Patient Location: PACU  Anesthesia Type:General  Level of Consciousness: drowsy and patient cooperative  Airway & Oxygen Therapy: Patient Spontanous Breathing and Patient connected to nasal cannula oxygen  Post-op Assessment: Report given to RN and Post -op Vital signs reviewed and stable  Post vital signs: Reviewed and stable  Last Vitals:  Vitals Value Taken Time  BP    Temp    Pulse    Resp    SpO2      Last Pain:  Vitals:   03/29/23 0900  TempSrc: Temporal         Complications: No notable events documented.

## 2023-03-29 NOTE — Anesthesia Postprocedure Evaluation (Signed)
Anesthesia Post Note  Patient: Kayla Chambers  Procedure(s) Performed: CARDIOVERSION     Patient location during evaluation: Cath Lab Anesthesia Type: General Level of consciousness: awake and alert, patient cooperative and oriented Pain management: pain level controlled Vital Signs Assessment: post-procedure vital signs reviewed and stable Respiratory status: nonlabored ventilation, spontaneous breathing and respiratory function stable Cardiovascular status: blood pressure returned to baseline and stable Postop Assessment: no apparent nausea or vomiting and able to ambulate Anesthetic complications: no   No notable events documented.  Last Vitals:  Vitals:   03/29/23 1019 03/29/23 1029  BP: 134/83 133/85  Pulse: 70 70  Resp: 20 20  Temp:    SpO2: 100% 93%    Last Pain:  Vitals:   03/29/23 1029  TempSrc:   PainSc: 0-No pain                 Liala Codispoti,E. Kenyada Hy

## 2023-03-29 NOTE — Anesthesia Preprocedure Evaluation (Signed)
Anesthesia Evaluation  Patient identified by MRN, date of birth, ID band Patient awake    Reviewed: Allergy & Precautions, NPO status , Patient's Chart, lab work & pertinent test results  History of Anesthesia Complications Negative for: history of anesthetic complications  Airway Mallampati: II  TM Distance: >3 FB Neck ROM: Full    Dental  (+) Poor Dentition, Dental Advisory Given, Chipped   Pulmonary neg pulmonary ROS   breath sounds clear to auscultation       Cardiovascular hypertension, Pt. on medications (-) angina + CAD and + CABG  + dysrhythmias Atrial Fibrillation + Valvular Problems/Murmurs (s/p Bentall)  Rhythm:Irregular Rate:Normal  03/21/2023 ECHO: EF 60-65%, normal LVF, normal RVF, trivial MR, severe TR   Neuro/Psych  Headaches  Anxiety        GI/Hepatic negative GI ROS, Neg liver ROS,,,  Endo/Other  Hypothyroidism    Renal/GU negative Renal ROS     Musculoskeletal  (+)  Fibromyalgia -  Abdominal   Peds  Hematology eliquis   Anesthesia Other Findings   Reproductive/Obstetrics                             Anesthesia Physical Anesthesia Plan  ASA: 3  Anesthesia Plan: General   Post-op Pain Management: Minimal or no pain anticipated   Induction: Intravenous  PONV Risk Score and Plan: 3 and Treatment may vary due to age or medical condition  Airway Management Planned: Natural Airway and Nasal Cannula  Additional Equipment: None  Intra-op Plan:   Post-operative Plan:   Informed Consent: I have reviewed the patients History and Physical, chart, labs and discussed the procedure including the risks, benefits and alternatives for the proposed anesthesia with the patient or authorized representative who has indicated his/her understanding and acceptance.     Dental advisory given  Plan Discussed with: CRNA and Surgeon  Anesthesia Plan Comments:        Anesthesia  Quick Evaluation

## 2023-03-29 NOTE — CV Procedure (Signed)
Procedure   Cardioversion  Indication  Atrial fibrillation      Shared Decision Making/Informed Consent The risks (stroke, cardiac arrhythmias rarely resulting in the need for a temporary or permanent pacemaker, skin irritation or burns and complications associated with conscious sedation including aspiration, arrhythmia, respiratory failure and death), benefits (restoration of normal sinus rhythm) and alternatives of a direct current cardioversion were explained in detail to Ms. Giardina and she agrees to proceed.    Patient sedated by anesthesia with 60 mg propofol and 20 mg lidocaine intravenously by anesthesia  With pads in the AP position cardioversion attempted with 200 J synchronized biphasic energy.    There appeared to be a few sinus beats but as continued to monitor it  appeared to be atrial fibrillation.     Procedure was without complication  12 lead EKG performed   Dietrich Pates MD

## 2023-03-29 NOTE — Interval H&P Note (Signed)
History and Physical Interval Note:  03/29/2023 9:28 AM  Kayla Chambers  has presented today for surgery, with the diagnosis of AFIB.  The various methods of treatment have been discussed with the patient and family. After consideration of risks, benefits and other options for treatment, the patient has consented to  Procedure(s): CARDIOVERSION (N/A) as a surgical intervention.  The patient's history has been reviewed, patient examined, no change in status, stable for surgery.  I have reviewed the patient's chart and labs.  Questions were answered to the patient's satisfaction.     Dietrich Pates

## 2023-03-29 NOTE — Discharge Instructions (Signed)

## 2023-04-04 DIAGNOSIS — I5032 Chronic diastolic (congestive) heart failure: Secondary | ICD-10-CM | POA: Diagnosis not present

## 2023-04-05 ENCOUNTER — Encounter: Payer: Self-pay | Admitting: *Deleted

## 2023-04-05 LAB — BASIC METABOLIC PANEL
BUN/Creatinine Ratio: 23 (ref 12–28)
BUN: 21 mg/dL (ref 10–36)
CO2: 28 mmol/L (ref 20–29)
Calcium: 10 mg/dL (ref 8.7–10.3)
Chloride: 99 mmol/L (ref 96–106)
Creatinine, Ser: 0.93 mg/dL (ref 0.57–1.00)
Glucose: 99 mg/dL (ref 70–99)
Potassium: 4.9 mmol/L (ref 3.5–5.2)
Sodium: 143 mmol/L (ref 134–144)
eGFR: 57 mL/min/{1.73_m2} — ABNORMAL LOW (ref >59)

## 2023-04-17 ENCOUNTER — Telehealth: Payer: Self-pay | Admitting: Cardiology

## 2023-04-17 DIAGNOSIS — I5032 Chronic diastolic (congestive) heart failure: Secondary | ICD-10-CM

## 2023-04-17 MED ORDER — TORSEMIDE 20 MG PO TABS
ORAL_TABLET | ORAL | 3 refills | Status: DC
Start: 2023-04-17 — End: 2023-05-27

## 2023-04-17 NOTE — Telephone Encounter (Signed)
Patient's husband is calling stating the patient had a fall on Sunday, 05/26 that caused from bruising on her leg.    He would like a callback from RN Stanton Kidney to discuss medication adjustments being made due to this.  Please advise.

## 2023-04-17 NOTE — Telephone Encounter (Signed)
Spoke with pt husband, Aware of dr Ludwig Clarks recommendations.  Lab orders mailed to the pt

## 2023-04-17 NOTE — Telephone Encounter (Signed)
Patient husband states she had fall going up the legs.  She hit shins on the steps.  DIL has taken care of her shins. She has scrape to Right shin. Has antibiotic ointment with non stick dressing  She is on "heavy strength" furosemide and her leg that has injury is weeping. Patient has swelling and was changed from furosemide to torsemide, taking 2 in the morning and 1 at lunch. He ask if she should cut back medication due to the weeping.  He ask for any recommendations.

## 2023-04-25 DIAGNOSIS — I5032 Chronic diastolic (congestive) heart failure: Secondary | ICD-10-CM | POA: Diagnosis not present

## 2023-04-25 LAB — BASIC METABOLIC PANEL
BUN/Creatinine Ratio: 21 (ref 12–28)
BUN: 22 mg/dL (ref 10–36)
CO2: 27 mmol/L (ref 20–29)
Calcium: 9.4 mg/dL (ref 8.7–10.3)
Chloride: 99 mmol/L (ref 96–106)
Creatinine, Ser: 1.07 mg/dL — ABNORMAL HIGH (ref 0.57–1.00)
Glucose: 102 mg/dL — ABNORMAL HIGH (ref 70–99)
Potassium: 4 mmol/L (ref 3.5–5.2)
Sodium: 141 mmol/L (ref 134–144)
eGFR: 48 mL/min/{1.73_m2} — ABNORMAL LOW (ref >59)

## 2023-04-29 DIAGNOSIS — M8008XA Age-related osteoporosis with current pathological fracture, vertebra(e), initial encounter for fracture: Secondary | ICD-10-CM | POA: Diagnosis not present

## 2023-05-13 ENCOUNTER — Encounter: Payer: Self-pay | Admitting: Podiatry

## 2023-05-13 ENCOUNTER — Ambulatory Visit (INDEPENDENT_AMBULATORY_CARE_PROVIDER_SITE_OTHER): Payer: Medicare Other | Admitting: Podiatry

## 2023-05-13 DIAGNOSIS — M79675 Pain in left toe(s): Secondary | ICD-10-CM | POA: Diagnosis not present

## 2023-05-13 DIAGNOSIS — G6289 Other specified polyneuropathies: Secondary | ICD-10-CM

## 2023-05-13 DIAGNOSIS — B351 Tinea unguium: Secondary | ICD-10-CM | POA: Diagnosis not present

## 2023-05-13 DIAGNOSIS — M79674 Pain in right toe(s): Secondary | ICD-10-CM

## 2023-05-13 DIAGNOSIS — D696 Thrombocytopenia, unspecified: Secondary | ICD-10-CM | POA: Diagnosis not present

## 2023-05-13 NOTE — Progress Notes (Signed)
This patient presents to the office with chief complaint of long thick painful nails.  Patient says the nails are painful walking and wearing shoes.  This patient is unable to self treat.  This patient is unable to trim her nails since she is unable to reach her nails. She presents to the office with her daughter. She presents to the office for preventative foot care services.  General Appearance  Alert, conversant and in no acute stress.  Vascular  Dorsalis pedis and posterior tibial  pulses are  not palpable   due to swelling.bilaterally.  Capillary return is within normal limits  bilaterally. Cold feet bilaterally.  Neurologic  Senn-Weinstein monofilament wire test within normal limits  bilaterally. Muscle power within normal limits bilaterally.  Nails Thick disfigured discolored nails with subungual debris  from hallux to fifth toes bilaterally. No evidence of bacterial infection or drainage bilaterally.  Orthopedic  No limitations of motion  feet .  No crepitus or effusions noted.  No bony pathology or digital deformities noted.  Skin  normotropic skin with no porokeratosis noted bilaterally.  No signs of infections or ulcers noted.     Onychomycosis  Nails  B/L.  Pain in right toes  Pain in left toes  Neuropathy.  Debridement of nails both feet followed trimming the nails with dremel tool.    RTC 4  months.   Helane Gunther DPM

## 2023-05-15 NOTE — Progress Notes (Signed)
HPI: Follow-up PAF, CAD status post coronary artery bypass graft, Bentall/AVR and maze procedure. She underwent a Bentall procedure by Dr. Tyrone Sage on 03/31/13. 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 Preoperatively she had been in and out of atrial fibrillation. Carotid Dopplers March 2017 showed 1-39% bilateral stenosis. 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  Echocardiogram February 2022 showed normal LV function, mild left ventricular hypertrophy, moderate right ventricular enlargement, severe right atrial enlargement, mild to moderate tricuspid regurgitation, previous Bentall with aortic valve replacement and mean gradient 8 mmHg with no aortic insufficiency.  Also with history of paroxysmal atrial fibrillation. Echo May 2024 showed normal LV function, severe biatrial enlargement, severe tricuspid regurgitation.Patient noted to be in recurrent atrial fibrillation at last office visit with worsening CHF.  Attempt at repeat cardioversion Mar 29, 2023 unsuccessful.  Since last seen, she denies dyspnea, chest pain, palpitations or syncope.  She continues to have pedal edema but improved compared to previous.  Current Outpatient Medications  Medication Sig Dispense Refill   acetaminophen (TYLENOL) 500 MG tablet Take 1 tablet (500 mg total) by mouth every 6 (six) hours as needed for mild pain (pain.). (Patient taking differently: Take 500 mg by mouth at bedtime as needed for mild pain (pain.).) 30 tablet 0   ALPRAZolam (XANAX) 0.5 MG tablet Take 0.5 mg by mouth at bedtime.     amiodarone (PACERONE) 200 MG tablet TAKE 1 TABLET(200 MG) BY MOUTH DAILY 90 tablet 3   apixaban (ELIQUIS) 5 MG TABS tablet Take 1 tablet (5 mg total) by mouth 2 (two) times daily. 180 tablet 3   lactase (LACTAID) 3000 UNITS tablet Take 3,000 Units by mouth daily as needed  (only when eating dairy).     levothyroxine (SYNTHROID, LEVOTHROID) 75 MCG tablet Take 75 mcg by mouth at bedtime.     Multiple Vitamin (MULTIVITAMIN WITH MINERALS) TABS tablet Take 1 tablet by mouth daily.     oxyCODONE-acetaminophen (PERCOCET) 7.5-325 MG tablet Take 1 tablet by mouth every 4 (four) hours.     polyethylene glycol (MIRALAX / GLYCOLAX) 17 g packet Take 17 g by mouth in the morning.     pravastatin (PRAVACHOL) 40 MG tablet Take 40 mg by mouth every other day.     rOPINIRole (REQUIP) 0.25 MG tablet Take 0.25 mg by mouth at bedtime.     senna (SENOKOT) 8.6 MG tablet Take 1 tablet by mouth in the morning.     torsemide (DEMADEX) 20 MG tablet 2 tablets in the morning and 2 tablet in the afternoon 270 tablet 3   No current facility-administered medications for this visit.     Past Medical History:  Diagnosis Date   Anxiety    takes xanax for sleep.    Aortic atherosclerosis (HCC) 12/05/2021   Aortic insufficiency    a. 03/2013 s/p  AVR w/ biologic 25mm Magna Ease peric tissue valve, model 300TFX, ser# [id number].    Back pain    BACK BRACE/FRACTURE   CAD (coronary artery disease)    a. 01/2013 mod calcification w/o sev dzs;  b. 03/2013 CABG x1 VG->RCA @ time of AVR   Chronic diastolic CHF (congestive heart failure) (HCC)    a. 01/2013 echo: EF 50-55%.   Compression fracture of body of thoracic vertebra (HCC)    T-11   Dementia (HCC)    Dilated aortic  root (HCC)    a. 03/2013 s/p Bentall procedure with Ao Root replacement (28mm Valsalva graft) w/ reimplantationof the R and L coronary ostium.   Dysrhythmia    A. 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 Neoadjuvant RT by Dr. Abelardo Diesel. Surveillance plan: MRI 12/08/11 shows changes in presumed post-op seroma/hematoma with  slight increase in size - follow up MRI 11/18/12 showed same size. Will continue with q3 month MRI. CT C/A/P annually alternating with CXR at 6 months.    Osteoporosis    Paroxysmal A-fib (HCC)    a. Dx 01/2013 - placed on xarelto;  b. 03/2013 s/p left sided MAZE and LA clip @ time of AVR/CABG.   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   BLADDER SUSPENSION     CARDIOVERSION N/A 01/29/2017   Procedure: CARDIOVERSION;  Surgeon: Jake Bathe, MD;  Location: [facility] ENDOSCOPY;  Service: Cardiovascular;  Laterality: N/A;   CARDIOVERSION N/A 10/22/2017   Procedure: CARDIOVERSION;  Surgeon: Lewayne Bunting, MD;  Location: [facility] ENDOSCOPY;  Service: Cardiovascular;  Laterality: N/A;   CARDIOVERSION N/A 03/29/2023   Procedure: CARDIOVERSION;  Surgeon: Pricilla Riffle, MD;  Location: [facility] INVASIVE CV LAB;  Service: Cardiovascular;  Laterality: N/A;   COLONOSCOPY  10/25/2011   Procedure: COLONOSCOPY;  Surgeon: Charolett Bumpers, MD;  Location: [facility] ENDOSCOPY;  Service: Endoscopy;  Laterality: N/A;   EYE SURGERY Bilateral    cataracts   INTRAOPERATIVE TRANSESOPHAGEAL ECHOCARDIOGRAM N/A 03/31/2013   Procedure: INTRAOPERATIVE TRANSESOPHAGEAL ECHOCARDIOGRAM;  Surgeon: Delight Ovens, MD;  Location: [facility] OR;  Service: Open Heart Surgery;  Laterality: N/A;   KYPHOPLASTY N/A 05/07/2018   Procedure: KYPHOPLASTY LUMBAR THREE;  Surgeon: Venita Lick, MD;  Location: [facility] OR;  Service: Orthopedics;  Laterality: N/A;   KYPHOPLASTY N/A 05/14/2019   Procedure: KYPHOPLASTY T11;  Surgeon: Venita Lick, MD;  Location: [facility] OR;  Service: Orthopedics;  Laterality: N/A;  90 mins   KYPHOPLASTY N/A 07/08/2019   Procedure: KYPHOPLASTY THORACIC TWELVE;  Surgeon: Venita Lick, MD;  Location: [facility] OR;  Service: Orthopedics;  Laterality: N/A;  60 mins   LEFT AND RIGHT HEART CATHETERIZATION WITH CORONARY ANGIOGRAM Right 02/02/2013   Procedure: LEFT AND RIGHT HEART CATHETERIZATION WITH CORONARY ANGIOGRAM;  Surgeon: Herby Abraham, MD;  Location: [facility] CATH LAB;  Service: Cardiovascular;  Laterality: Right;   MAZE N/A 03/31/2013   Procedure: MAZE;  Surgeon: Delight Ovens, MD;  Location: [facility] OR;  Service: Open Heart Surgery;  Laterality: N/A;   Sarcoma resection  2011    Social History   Socioeconomic History   Marital status: Married    Spouse name: Not on file   Number of children: Not on file   Years of education: Not on file   Highest  education level: Not on file  Occupational History   Not on file  Tobacco Use   Smoking status: Never   Smokeless tobacco: Never  Vaping Use   Vaping Use: Never used  Substance and Sexual Activity   Alcohol use: No   Drug use: No   Sexual activity: Yes    Birth control/protection: Post-menopausal  Other Topics Concern   Not on file  Social History Narrative   Not on file   Social Determinants of Health   Financial Resource Strain: Not on file  Food Insecurity: Not on file  Transportation Needs: Not on file  Physical Activity: Not on file  Stress: Not on file  Social Connections: Not on file  Intimate Partner Violence: Not on file    Family History  Problem Relation Age of Onset   Heart disease Mother    Stroke Mother    Arthritis Father    Cancer Father    Anesthesia problems Brother    Hypertension Sister     ROS: no fevers or chills, productive cough, hemoptysis, dysphasia, odynophagia, melena, hematochezia, dysuria, hematuria, rash, seizure activity, orthopnea, PND, claudication. Remaining systems are negative.  Physical  Exam: Well-developed frail in no acute distress.  Skin is warm and dry.  HEENT is normal.  Neck is supple.  Chest is clear to auscultation with normal expansion.  Cardiovascular exam is irregular Abdominal exam nontender or distended. No masses palpated. Extremities show 2+ edema. neuro grossly intact       A/P  1 persistent atrial fibrillation-patient had attempt at repeat cardioversion in May.  This was unsuccessful.  We therefore will plan rate control and anticoagulation.  Will continue amiodarone and apixaban.  2 chronic diastolic congestive heart failure-she has 2+ pedal edema.  I will increase Demadex to 60 mg in the morning and 40 mg in the afternoon.  Check potassium and renal function in 1 week.  We discussed keeping her feet elevated as well.  3 coronary artery disease-no chest pain.  Continue statin.  No aspirin given need for apixaban.  4 hypertension-blood pressure controlled.  Continue present medications and follow-up.  5 history of aortic valve replacement-continue SBE prophylaxis.  6 hyperlipidemia-continue statin.  7 tricuspid regurgitation-plan conservative measures given patient's age and overall medical condition.  Olga Millers, MD

## 2023-05-27 ENCOUNTER — Encounter: Payer: Self-pay | Admitting: Cardiology

## 2023-05-27 ENCOUNTER — Ambulatory Visit: Payer: Medicare Other | Admitting: Cardiology

## 2023-05-27 VITALS — BP 118/68 | HR 63 | Ht 66.0 in | Wt 146.4 lb

## 2023-05-27 DIAGNOSIS — I5032 Chronic diastolic (congestive) heart failure: Secondary | ICD-10-CM | POA: Diagnosis not present

## 2023-05-27 DIAGNOSIS — I480 Paroxysmal atrial fibrillation: Secondary | ICD-10-CM | POA: Insufficient documentation

## 2023-05-27 DIAGNOSIS — I5033 Acute on chronic diastolic (congestive) heart failure: Secondary | ICD-10-CM | POA: Insufficient documentation

## 2023-05-27 DIAGNOSIS — I099 Rheumatic heart disease, unspecified: Secondary | ICD-10-CM | POA: Diagnosis not present

## 2023-05-27 MED ORDER — TORSEMIDE 20 MG PO TABS: ORAL_TABLET | ORAL | 3 refills | Status: DC

## 2023-05-27 NOTE — Patient Instructions (Signed)
Medication Instructions:   CHANGE TORSEMIDE TO 3 TABLETS IN THE MORNING AND 2 TABLETS IN THE AFTERNOON  *If you need a refill on your cardiac medications before your next appointment, please call your pharmacy*   Lab Work:  Your physician recommends that you return for lab work in: ONE WEEK-DO NOT NEED TO FAST  If you have labs (blood work) drawn today and your tests are completely normal, you will receive your results only by: MyChart Message (if you have MyChart) OR A paper copy in the mail If you have any lab test that is abnormal or we need to change your treatment, we will call you to review the results.    Follow-Up: At [facility], you and your health needs are our priority.  As part of our continuing mission to provide you with exceptional heart care, we have created designated Provider Care Teams.  These Care Teams include your primary Cardiologist (physician) and Advanced Practice Providers (APPs -  Physician Assistants and Nurse Practitioners) who all work together to provide you with the care you need, when you need it.  We recommend signing up for the patient portal called "MyChart".  Sign up information is provided on this After Visit Summary.  MyChart is used to connect with patients for Virtual Visits (Telemedicine).  Patients are able to view lab/test results, encounter notes, upcoming appointments, etc.  Non-urgent messages can be sent to your provider as well.   To learn more about what you can do with MyChart, go to [email or web address].    Your next appointment:   3 month(s)  Provider:   Olga Millers, MD

## 2023-06-05 DIAGNOSIS — I5033 Acute on chronic diastolic (congestive) heart failure: Secondary | ICD-10-CM | POA: Diagnosis not present

## 2023-06-06 ENCOUNTER — Encounter: Payer: Self-pay | Admitting: Cardiology

## 2023-06-06 LAB — BASIC METABOLIC PANEL
BUN/Creatinine Ratio: 21 (ref 12–28)
BUN: 20 mg/dL (ref 10–36)
CO2: 28 mmol/L (ref 20–29)
Calcium: 8.8 mg/dL (ref 8.7–10.3)
Chloride: 100 mmol/L (ref 96–106)
Creatinine, Ser: 0.95 mg/dL (ref 0.57–1.00)
Glucose: 108 mg/dL — ABNORMAL HIGH (ref 70–99)
Potassium: 4.2 mmol/L (ref 3.5–5.2)
Sodium: 142 mmol/L (ref 134–144)
eGFR: 56 mL/min/{1.73_m2} — ABNORMAL LOW (ref >59)

## 2023-06-14 ENCOUNTER — Telehealth: Payer: Self-pay | Admitting: Cardiology

## 2023-06-14 NOTE — Telephone Encounter (Signed)
Pt c/o medication issue:  1. Name of Medication: torsemide (DEMADEX) 20 MG tablet   2. How are you currently taking this medication (dosage and times per day)?   3. Are you having a reaction (difficulty breathing--STAT)? Yes   4. What is your medication issue? Patient's husband is requesting a call back in regards to frequent urination for patient when taking this medication.

## 2023-06-14 NOTE — Telephone Encounter (Signed)
Spoke with patient husband.  States urinating every 30 minutes. He states it is about the same, if better then "not very much".  No SOB noted.  He ask could this be putting too much strain on her kidneys.  He also wonders if with her Dementia that is why she is going so much. Does not feel it is a UTI as no complaint of pain or behavior changes. Urine clear to light. He will have her continue the dosage until he hears back from [place].  Please advise if can change dosage

## 2023-06-14 NOTE — Telephone Encounter (Signed)
Patient's husband is returning call. Please return call to [phone].

## 2023-06-14 NOTE — Telephone Encounter (Signed)
Called pt husband back to address concerns. No answer and no VM to leave a message.

## 2023-06-17 NOTE — Telephone Encounter (Signed)
Spoke with pt, Aware of dr crenshaw's recommendations.  °

## 2023-08-12 ENCOUNTER — Other Ambulatory Visit: Payer: Self-pay | Admitting: *Deleted

## 2023-08-12 DIAGNOSIS — I480 Paroxysmal atrial fibrillation: Secondary | ICD-10-CM

## 2023-08-12 MED ORDER — AMIODARONE HCL 200 MG PO TABS
200.0000 mg | ORAL_TABLET | Freq: Every day | ORAL | 3 refills | Status: AC
Start: 2023-08-12 — End: ?

## 2023-08-19 NOTE — Progress Notes (Signed)
HPI: Follow-up PAF, CAD status post coronary artery bypass graft, Bentall/AVR and maze procedure. She underwent a Bentall procedure by Dr. Tyrone Sage on 03/31/13. 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 Preoperatively she had been in and out of atrial fibrillation. Carotid Dopplers March 2017 showed 1-39% bilateral stenosis. 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  Echo May 2024 showed normal LV function, severe biatrial enlargement, severe tricuspid regurgitation. Patient noted to be in recurrent atrial fibrillation at previous office visit with worsening CHF.  Attempt at repeat cardioversion Mar 29, 2023 unsuccessful.  Since last seen, she denies dyspnea, chest pain, palpitations or syncope.  No bleeding.  She continues to have pedal edema but by report some improvement compared to previous.  Current Outpatient Medications  Medication Sig Dispense Refill   acetaminophen (TYLENOL) 500 MG tablet Take 1 tablet (500 mg total) by mouth every 6 (six) hours as needed for mild pain (pain.). (Patient taking differently: Take 500 mg by mouth at bedtime as needed for mild pain (pain.).) 30 tablet 0   ALPRAZolam (XANAX) 0.5 MG tablet Take 0.5 mg by mouth at bedtime.     amiodarone (PACERONE) 200 MG tablet Take 1 tablet (200 mg total) by mouth daily. 90 tablet 3   apixaban (ELIQUIS) 5 MG TABS tablet Take 1 tablet (5 mg total) by mouth 2 (two) times daily. 180 tablet 3   lactase (LACTAID) 3000 UNITS tablet Take 3,000 Units by mouth daily as needed (only when eating dairy).     levothyroxine (SYNTHROID, LEVOTHROID) 75 MCG tablet Take 75 mcg by mouth at bedtime.     Multiple Vitamin (MULTIVITAMIN WITH MINERALS) TABS tablet Take 1 tablet by mouth daily.     oxyCODONE-acetaminophen (PERCOCET) 7.5-325 MG tablet Take 1 tablet by mouth every 4 (four) hours.      polyethylene glycol (MIRALAX / GLYCOLAX) 17 g packet Take 17 g by mouth in the morning.     pravastatin (PRAVACHOL) 40 MG tablet Take 40 mg by mouth every other day.     rOPINIRole (REQUIP) 0.25 MG tablet Take 0.25 mg by mouth at bedtime.     senna (SENOKOT) 8.6 MG tablet Take 1 tablet by mouth in the morning.     torsemide (DEMADEX) 20 MG tablet 3 tablets in the morning and 2 tablet in the afternoon 270 tablet 3   No current facility-administered medications for this visit.     Past Medical History:  Diagnosis Date   Anxiety    takes xanax for sleep.    Aortic atherosclerosis (HCC) 12/05/2021   Aortic insufficiency    a. 03/2013 s/p  AVR w/ biologic 25mm Magna Ease peric tissue valve, model 300TFX, ser# [id number].    Back pain    BACK BRACE/FRACTURE   CAD (coronary artery disease)    a. 01/2013 mod calcification w/o sev dzs;  b. 03/2013 CABG x1 VG->RCA @ time of AVR   Chronic diastolic CHF (congestive heart failure) (HCC)    a. 01/2013 echo: EF 50-55%.   Compression fracture of body of thoracic vertebra (HCC)    T-11   Dementia (HCC)    Dilated aortic root (HCC)    a. 03/2013 s/p Bentall procedure with Ao Root replacement (28mm Valsalva graft) w/ reimplantationof the R and L coronary ostium.   Dysrhythmia    A. 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 Neoadjuvant RT by Dr. Abelardo Diesel. Surveillance plan: MRI 12/08/11 shows changes in presumed post-op seroma/hematoma with slight increase in size - follow up MRI 11/18/12 showed same size. Will continue with q3 month MRI. CT C/A/P annually alternating with CXR at 6 months.    Osteoporosis    Paroxysmal A-fib (HCC)    a. Dx 01/2013 - placed on xarelto;  b. 03/2013 s/p left sided MAZE and LA clip @ time of AVR/CABG.   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   BLADDER SUSPENSION     CARDIOVERSION N/A 01/29/2017   Procedure: CARDIOVERSION;  Surgeon: Jake Bathe, MD;  Location: [facility] ENDOSCOPY;  Service: Cardiovascular;  Laterality: N/A;   CARDIOVERSION N/A 10/22/2017   Procedure: CARDIOVERSION;  Surgeon: Lewayne Bunting, MD;  Location: [facility] ENDOSCOPY;  Service: Cardiovascular;  Laterality: N/A;   CARDIOVERSION N/A 03/29/2023   Procedure: CARDIOVERSION;  Surgeon: Pricilla Riffle, MD;  Location: [facility] INVASIVE CV LAB;  Service: Cardiovascular;  Laterality: N/A;   COLONOSCOPY  10/25/2011   Procedure: COLONOSCOPY;  Surgeon: Charolett Bumpers, MD;  Location: [facility] ENDOSCOPY;  Service: Endoscopy;  Laterality: N/A;   EYE SURGERY Bilateral    cataracts   INTRAOPERATIVE TRANSESOPHAGEAL ECHOCARDIOGRAM N/A 03/31/2013   Procedure: INTRAOPERATIVE TRANSESOPHAGEAL ECHOCARDIOGRAM;  Surgeon: Delight Ovens, MD;  Location: [facility] OR;  Service: Open Heart Surgery;  Laterality: N/A;   KYPHOPLASTY N/A 05/07/2018   Procedure: KYPHOPLASTY LUMBAR THREE;  Surgeon: Venita Lick, MD;  Location: [facility] OR;  Service: Orthopedics;  Laterality: N/A;   KYPHOPLASTY N/A 05/14/2019   Procedure: KYPHOPLASTY T11;  Surgeon: Venita Lick, MD;  Location: [facility] OR;  Service: Orthopedics;  Laterality: N/A;  90 mins   KYPHOPLASTY N/A 07/08/2019   Procedure: KYPHOPLASTY THORACIC TWELVE;  Surgeon: Venita Lick, MD;  Location: [facility] OR;  Service: Orthopedics;  Laterality: N/A;  60 mins   LEFT AND RIGHT HEART CATHETERIZATION WITH CORONARY  ANGIOGRAM Right 02/02/2013   Procedure: LEFT AND RIGHT HEART CATHETERIZATION WITH CORONARY ANGIOGRAM;  Surgeon: Herby Abraham, MD;  Location: [facility] CATH LAB;  Service: Cardiovascular;  Laterality: Right;   MAZE N/A 03/31/2013   Procedure: MAZE;  Surgeon: Delight Ovens, MD;  Location: [facility] OR;  Service: Open Heart Surgery;  Laterality: N/A;   Sarcoma resection  2011    Social History   Socioeconomic History   Marital status: Married    Spouse name: Not on file   Number of children: Not on file   Years of education: Not on file   Highest education level: Not on file  Occupational History   Not on file  Tobacco Use   Smoking status: Never   Smokeless tobacco: Never  Vaping Use   Vaping status: Never Used  Substance and Sexual  Activity   Alcohol use: No   Drug use: No   Sexual activity: Yes    Birth control/protection: Post-menopausal  Other Topics Concern   Not on file  Social History Narrative   Not on file   Social Determinants of Health   Financial Resource Strain: Not on file  Food Insecurity: Not on file  Transportation Needs: Not on file  Physical Activity: Not on file  Stress: Not on file  Social Connections: Not on file  Intimate Partner Violence: Not on file    Family History  Problem Relation Age of Onset   Heart disease Mother    Stroke Mother    Arthritis Father    Cancer Father    Anesthesia problems Brother    Hypertension Sister     ROS: no fevers or chills, productive cough, hemoptysis, dysphasia, odynophagia, melena, hematochezia, dysuria, hematuria, rash, seizure activity, orthopnea, PND, pedal edema, claudication. Remaining systems are negative.  Physical Exam: Well-developed well-nourished in no acute distress.  Skin is warm and dry.  HEENT is normal.  Neck is supple.  Chest is clear to auscultation with normal expansion.  Cardiovascular exam is irregular Abdominal exam nontender or distended. No masses palpated. Extremities show 2+  edema. neuro grossly intact   A/P  1 chronic diastolic congestive heart failure-some improvement compared to previous.  Will continue Demadex at present dose.  She has difficulty controlling her urine.  I have therefore not added an SGLT2 inhibitor.  Check potassium and renal function.  2 persistent atrial fibrillation-patient underwent repeat attempt at cardioversion in May of this year.  The attempt was unsuccessful.  Therefore plan rate control and anticoagulation.  Will continue amiodarone and apixaban.  Check hemoglobin, TSH, liver functions and chest x-ray.  3 hypertension-patient's blood pressure is controlled.  Continue present medical regimen.  4 coronary artery disease-patient is not having chest pain.  Continue statin.  5 hyperlipidemia-continue statin.  6 status post aortic valve replacement-continue SBE prophylaxis.  7 tricuspid regurgitation-plan is conservative measures given patient's age and overall medical condition.  Olga Millers, MD

## 2023-08-20 DIAGNOSIS — M51369 Other intervertebral disc degeneration, lumbar region without mention of lumbar back pain or lower extremity pain: Secondary | ICD-10-CM | POA: Diagnosis not present

## 2023-08-20 DIAGNOSIS — Z79891 Long term (current) use of opiate analgesic: Secondary | ICD-10-CM | POA: Diagnosis not present

## 2023-08-20 DIAGNOSIS — M4856XD Collapsed vertebra, not elsewhere classified, lumbar region, subsequent encounter for fracture with routine healing: Secondary | ICD-10-CM | POA: Diagnosis not present

## 2023-08-20 DIAGNOSIS — M4854XA Collapsed vertebra, not elsewhere classified, thoracic region, initial encounter for fracture: Secondary | ICD-10-CM | POA: Diagnosis not present

## 2023-08-26 DIAGNOSIS — Z79899 Other long term (current) drug therapy: Secondary | ICD-10-CM | POA: Diagnosis not present

## 2023-08-26 DIAGNOSIS — Z5181 Encounter for therapeutic drug level monitoring: Secondary | ICD-10-CM | POA: Diagnosis not present

## 2023-09-02 ENCOUNTER — Encounter: Payer: Self-pay | Admitting: Cardiology

## 2023-09-02 ENCOUNTER — Ambulatory Visit: Payer: Medicare Other | Attending: Cardiology | Admitting: Cardiology

## 2023-09-02 ENCOUNTER — Ambulatory Visit
Admission: RE | Admit: 2023-09-02 | Discharge: 2023-09-02 | Disposition: A | Discharge status: Home / Self Care | Payer: Medicare Other | Source: Ambulatory Visit | Attending: Cardiology | Admitting: Cardiology

## 2023-09-02 VITALS — BP 110/74 | HR 84 | Ht 65.0 in | Wt 155.8 lb

## 2023-09-02 DIAGNOSIS — J90 Pleural effusion, not elsewhere classified: Secondary | ICD-10-CM | POA: Diagnosis not present

## 2023-09-02 DIAGNOSIS — J9811 Atelectasis: Secondary | ICD-10-CM | POA: Diagnosis not present

## 2023-09-02 DIAGNOSIS — I517 Cardiomegaly: Secondary | ICD-10-CM | POA: Diagnosis not present

## 2023-09-02 DIAGNOSIS — I5032 Chronic diastolic (congestive) heart failure: Secondary | ICD-10-CM | POA: Insufficient documentation

## 2023-09-02 DIAGNOSIS — I10 Essential (primary) hypertension: Secondary | ICD-10-CM | POA: Diagnosis not present

## 2023-09-02 DIAGNOSIS — I480 Paroxysmal atrial fibrillation: Secondary | ICD-10-CM

## 2023-09-02 DIAGNOSIS — I2510 Atherosclerotic heart disease of native coronary artery without angina pectoris: Secondary | ICD-10-CM | POA: Insufficient documentation

## 2023-09-02 DIAGNOSIS — I359 Nonrheumatic aortic valve disorder, unspecified: Secondary | ICD-10-CM | POA: Insufficient documentation

## 2023-09-02 NOTE — Patient Instructions (Signed)
      Testing/Procedures: A chest x-ray takes a picture of the organs and structures inside the chest, including the heart, lungs, and blood vessels. This test can show several things, including, whether the heart is enlarges; whether fluid is building up in the lungs; and whether pacemaker / defibrillator leads are still in place.  [facility] [address]    Follow-Up: At [facility], you and your health needs are our priority.  As part of our continuing mission to provide you with exceptional heart care, we have created designated Provider Care Teams.  These Care Teams include your primary Cardiologist (physician) and Advanced Practice Providers (APPs -  Physician Assistants and Nurse Practitioners) who all work together to provide you with the care you need, when you need it.  We recommend signing up for the patient portal called "MyChart".  Sign up information is provided on this After Visit Summary.  MyChart is used to connect with patients for Virtual Visits (Telemedicine).  Patients are able to view lab/test results, encounter notes, upcoming appointments, etc.  Non-urgent messages can be sent to your provider as well.   To learn more about what you can do with MyChart, go to [email or web address].    Your next appointment:   4 months   Provider:   Olga Millers, MD

## 2023-09-03 LAB — BASIC METABOLIC PANEL
BUN/Creatinine Ratio: 19 (ref 12–28)
BUN: 22 mg/dL (ref 10–36)
CO2: 28 mmol/L (ref 20–29)
Calcium: 9.3 mg/dL (ref 8.7–10.3)
Chloride: 102 mmol/L (ref 96–106)
Creatinine, Ser: 1.15 mg/dL — ABNORMAL HIGH (ref 0.57–1.00)
Glucose: 90 mg/dL (ref 70–99)
Potassium: 4.9 mmol/L (ref 3.5–5.2)
Sodium: 143 mmol/L (ref 134–144)
eGFR: 44 mL/min/{1.73_m2} — ABNORMAL LOW (ref >59)

## 2023-09-03 LAB — CBC
Hematocrit: 36.8 % (ref 34.0–46.6)
Hemoglobin: 12.2 g/dL (ref 11.1–15.9)
MCH: 33.3 pg — ABNORMAL HIGH (ref 26.6–33.0)
MCHC: 33.2 g/dL (ref 31.5–35.7)
MCV: 101 fL — ABNORMAL HIGH (ref 79–97)
Platelets: 114 10*3/uL — ABNORMAL LOW (ref 150–450)
RBC: 3.66 x10E6/uL — ABNORMAL LOW (ref 3.77–5.28)
RDW: 15 % (ref 11.7–15.4)
WBC: 5.3 10*3/uL (ref 3.4–10.8)

## 2023-09-03 LAB — HEPATIC FUNCTION PANEL
ALT: 14 IU/L (ref 0–32)
AST: 22 IU/L (ref 0–40)
Albumin: 4.1 g/dL (ref 3.6–4.6)
Alkaline Phosphatase: 98 IU/L (ref 44–121)
Bilirubin Total: 0.3 mg/dL (ref 0.0–1.2)
Bilirubin, Direct: 0.12 mg/dL (ref 0.00–0.40)
Total Protein: 6.2 g/dL (ref 6.0–8.5)

## 2023-09-03 LAB — TSH: TSH: 8.71 u[IU]/mL — ABNORMAL HIGH (ref 0.450–4.500)

## 2023-09-12 ENCOUNTER — Ambulatory Visit (INDEPENDENT_AMBULATORY_CARE_PROVIDER_SITE_OTHER): Payer: Medicare Other | Admitting: Podiatry

## 2023-09-12 ENCOUNTER — Encounter: Payer: Self-pay | Admitting: Podiatry

## 2023-09-12 VITALS — Ht 65.0 in | Wt 155.8 lb

## 2023-09-12 DIAGNOSIS — M79675 Pain in left toe(s): Secondary | ICD-10-CM | POA: Diagnosis not present

## 2023-09-12 DIAGNOSIS — B351 Tinea unguium: Secondary | ICD-10-CM | POA: Diagnosis not present

## 2023-09-12 DIAGNOSIS — M79674 Pain in right toe(s): Secondary | ICD-10-CM | POA: Diagnosis not present

## 2023-09-12 DIAGNOSIS — G6289 Other specified polyneuropathies: Secondary | ICD-10-CM

## 2023-09-12 NOTE — Progress Notes (Signed)
This patient presents to the office with chief complaint of long thick painful nails.  Patient says the nails are painful walking and wearing shoes.  This patient is unable to self treat.  This patient is unable to trim her nails since she is unable to reach her nails. She presents to the office with her daughter. She presents to the office for preventative foot care services.  General Appearance  Alert, conversant and in no acute stress.  Vascular  Dorsalis pedis and posterior tibial  pulses are  not palpable   due to swelling.bilaterally.  Capillary return is within normal limits  bilaterally. Cold feet bilaterally.  Neurologic  Senn-Weinstein monofilament wire test within normal limits  bilaterally. Muscle power within normal limits bilaterally.  Nails Thick disfigured discolored nails with subungual debris  from hallux to fifth toes bilaterally. No evidence of bacterial infection or drainage bilaterally.  Orthopedic  No limitations of motion  feet .  No crepitus or effusions noted.  No bony pathology or digital deformities noted.  Skin  normotropic skin with no porokeratosis noted bilaterally.  No signs of infections or ulcers noted.     Onychomycosis  Nails  B/L.  Pain in right toes  Pain in left toes  Neuropathy.  Debridement of nails both feet followed trimming the nails with dremel tool.    RTC 4  months.   Helane Gunther DPM

## 2023-09-23 ENCOUNTER — Encounter: Payer: Self-pay | Admitting: *Deleted

## 2023-11-06 DIAGNOSIS — M810 Age-related osteoporosis without current pathological fracture: Secondary | ICD-10-CM | POA: Diagnosis not present

## 2023-11-18 DIAGNOSIS — I878 Other specified disorders of veins: Secondary | ICD-10-CM | POA: Diagnosis not present

## 2023-11-27 DIAGNOSIS — G2581 Restless legs syndrome: Secondary | ICD-10-CM | POA: Diagnosis not present

## 2023-11-27 DIAGNOSIS — M810 Age-related osteoporosis without current pathological fracture: Secondary | ICD-10-CM | POA: Diagnosis not present

## 2023-11-27 DIAGNOSIS — Z1331 Encounter for screening for depression: Secondary | ICD-10-CM | POA: Diagnosis not present

## 2023-11-27 DIAGNOSIS — Z0000 Encounter for general adult medical examination without abnormal findings: Secondary | ICD-10-CM | POA: Diagnosis not present

## 2023-11-27 DIAGNOSIS — F0390 Unspecified dementia without behavioral disturbance: Secondary | ICD-10-CM | POA: Diagnosis not present

## 2023-11-27 DIAGNOSIS — I872 Venous insufficiency (chronic) (peripheral): Secondary | ICD-10-CM | POA: Diagnosis not present

## 2023-11-27 DIAGNOSIS — F419 Anxiety disorder, unspecified: Secondary | ICD-10-CM | POA: Diagnosis not present

## 2023-11-27 DIAGNOSIS — E440 Moderate protein-calorie malnutrition: Secondary | ICD-10-CM | POA: Diagnosis not present

## 2023-11-27 DIAGNOSIS — R600 Localized edema: Secondary | ICD-10-CM | POA: Diagnosis not present

## 2023-11-27 DIAGNOSIS — I110 Hypertensive heart disease with heart failure: Secondary | ICD-10-CM | POA: Diagnosis not present

## 2023-11-27 DIAGNOSIS — E039 Hypothyroidism, unspecified: Secondary | ICD-10-CM | POA: Diagnosis not present

## 2023-11-27 DIAGNOSIS — I5032 Chronic diastolic (congestive) heart failure: Secondary | ICD-10-CM | POA: Diagnosis not present

## 2023-11-28 DIAGNOSIS — M4800 Spinal stenosis, site unspecified: Secondary | ICD-10-CM | POA: Diagnosis not present

## 2023-11-28 DIAGNOSIS — I5032 Chronic diastolic (congestive) heart failure: Secondary | ICD-10-CM | POA: Diagnosis not present

## 2023-11-28 DIAGNOSIS — Z7901 Long term (current) use of anticoagulants: Secondary | ICD-10-CM | POA: Diagnosis not present

## 2023-11-28 DIAGNOSIS — I110 Hypertensive heart disease with heart failure: Secondary | ICD-10-CM | POA: Diagnosis not present

## 2023-11-28 DIAGNOSIS — G629 Polyneuropathy, unspecified: Secondary | ICD-10-CM | POA: Diagnosis not present

## 2023-11-28 DIAGNOSIS — M791 Myalgia, unspecified site: Secondary | ICD-10-CM | POA: Diagnosis not present

## 2023-11-28 DIAGNOSIS — I87303 Chronic venous hypertension (idiopathic) without complications of bilateral lower extremity: Secondary | ICD-10-CM | POA: Diagnosis not present

## 2023-11-28 DIAGNOSIS — E440 Moderate protein-calorie malnutrition: Secondary | ICD-10-CM | POA: Diagnosis not present

## 2023-11-28 DIAGNOSIS — Z953 Presence of xenogenic heart valve: Secondary | ICD-10-CM | POA: Diagnosis not present

## 2023-11-28 DIAGNOSIS — M353 Polymyalgia rheumatica: Secondary | ICD-10-CM | POA: Diagnosis not present

## 2023-11-28 DIAGNOSIS — Z951 Presence of aortocoronary bypass graft: Secondary | ICD-10-CM | POA: Diagnosis not present

## 2023-11-28 DIAGNOSIS — E039 Hypothyroidism, unspecified: Secondary | ICD-10-CM | POA: Diagnosis not present

## 2023-11-28 DIAGNOSIS — E7800 Pure hypercholesterolemia, unspecified: Secondary | ICD-10-CM | POA: Diagnosis not present

## 2023-11-28 DIAGNOSIS — G309 Alzheimer's disease, unspecified: Secondary | ICD-10-CM | POA: Diagnosis not present

## 2023-11-28 DIAGNOSIS — M810 Age-related osteoporosis without current pathological fracture: Secondary | ICD-10-CM | POA: Diagnosis not present

## 2023-11-28 DIAGNOSIS — G2581 Restless legs syndrome: Secondary | ICD-10-CM | POA: Diagnosis not present

## 2023-11-28 DIAGNOSIS — I2510 Atherosclerotic heart disease of native coronary artery without angina pectoris: Secondary | ICD-10-CM | POA: Diagnosis not present

## 2023-11-28 DIAGNOSIS — K7460 Unspecified cirrhosis of liver: Secondary | ICD-10-CM | POA: Diagnosis not present

## 2023-11-28 DIAGNOSIS — F0284 Dementia in other diseases classified elsewhere, unspecified severity, with anxiety: Secondary | ICD-10-CM | POA: Diagnosis not present

## 2023-11-28 DIAGNOSIS — I700 Atherosclerosis of aorta: Secondary | ICD-10-CM | POA: Diagnosis not present

## 2023-11-28 DIAGNOSIS — Z9181 History of falling: Secondary | ICD-10-CM | POA: Diagnosis not present

## 2023-11-28 DIAGNOSIS — G8929 Other chronic pain: Secondary | ICD-10-CM | POA: Diagnosis not present

## 2023-11-28 DIAGNOSIS — I480 Paroxysmal atrial fibrillation: Secondary | ICD-10-CM | POA: Diagnosis not present

## 2023-11-29 ENCOUNTER — Telehealth: Payer: Self-pay | Admitting: Cardiology

## 2023-11-29 NOTE — Telephone Encounter (Signed)
 Patient identification verified by 2 forms. Bertina Cooks, RN    Called and spoke to patient spouse Maple Maple states:   -patient has home health nurse, home health nurse ordered by PCP office   -home health nurse would like to wrap patients legs due to swelling   -patient takes 60mg  torsemide  twice daily   -swelling is a little bit worse in legs   -concerned about wrapping of legs incase it causes weeping, and possible infection   -patient has some redness above left knee, not warm to touch, evaluated by PA   -she walks from bed to commode using walker  Informed Maple message sent to Dr. Pietro Maple has no further questions at this time

## 2023-11-29 NOTE — Telephone Encounter (Signed)
 Pt spouse called in stating pt's caregiver wants to wrap her legs because they are swollen and he wants to know if this is okay. Please advise.

## 2023-12-02 DIAGNOSIS — I87303 Chronic venous hypertension (idiopathic) without complications of bilateral lower extremity: Secondary | ICD-10-CM | POA: Diagnosis not present

## 2023-12-02 DIAGNOSIS — I5032 Chronic diastolic (congestive) heart failure: Secondary | ICD-10-CM | POA: Diagnosis not present

## 2023-12-02 DIAGNOSIS — G309 Alzheimer's disease, unspecified: Secondary | ICD-10-CM | POA: Diagnosis not present

## 2023-12-02 DIAGNOSIS — I480 Paroxysmal atrial fibrillation: Secondary | ICD-10-CM | POA: Diagnosis not present

## 2023-12-02 DIAGNOSIS — I110 Hypertensive heart disease with heart failure: Secondary | ICD-10-CM | POA: Diagnosis not present

## 2023-12-02 DIAGNOSIS — F0284 Dementia in other diseases classified elsewhere, unspecified severity, with anxiety: Secondary | ICD-10-CM | POA: Diagnosis not present

## 2023-12-03 NOTE — Telephone Encounter (Signed)
 Spoke with pt, Aware of dr Ludwig Clarks recommendations.

## 2023-12-05 DIAGNOSIS — I5032 Chronic diastolic (congestive) heart failure: Secondary | ICD-10-CM | POA: Diagnosis not present

## 2023-12-05 DIAGNOSIS — E039 Hypothyroidism, unspecified: Secondary | ICD-10-CM | POA: Diagnosis not present

## 2023-12-05 DIAGNOSIS — I480 Paroxysmal atrial fibrillation: Secondary | ICD-10-CM | POA: Diagnosis not present

## 2023-12-05 DIAGNOSIS — I87303 Chronic venous hypertension (idiopathic) without complications of bilateral lower extremity: Secondary | ICD-10-CM | POA: Diagnosis not present

## 2023-12-05 DIAGNOSIS — G309 Alzheimer's disease, unspecified: Secondary | ICD-10-CM | POA: Diagnosis not present

## 2023-12-05 DIAGNOSIS — F0284 Dementia in other diseases classified elsewhere, unspecified severity, with anxiety: Secondary | ICD-10-CM | POA: Diagnosis not present

## 2023-12-05 DIAGNOSIS — I110 Hypertensive heart disease with heart failure: Secondary | ICD-10-CM | POA: Diagnosis not present

## 2023-12-09 DIAGNOSIS — I480 Paroxysmal atrial fibrillation: Secondary | ICD-10-CM | POA: Diagnosis not present

## 2023-12-09 DIAGNOSIS — I110 Hypertensive heart disease with heart failure: Secondary | ICD-10-CM | POA: Diagnosis not present

## 2023-12-09 DIAGNOSIS — F0284 Dementia in other diseases classified elsewhere, unspecified severity, with anxiety: Secondary | ICD-10-CM | POA: Diagnosis not present

## 2023-12-09 DIAGNOSIS — I5032 Chronic diastolic (congestive) heart failure: Secondary | ICD-10-CM | POA: Diagnosis not present

## 2023-12-09 DIAGNOSIS — I87303 Chronic venous hypertension (idiopathic) without complications of bilateral lower extremity: Secondary | ICD-10-CM | POA: Diagnosis not present

## 2023-12-09 DIAGNOSIS — G309 Alzheimer's disease, unspecified: Secondary | ICD-10-CM | POA: Diagnosis not present

## 2023-12-10 ENCOUNTER — Telehealth: Payer: Self-pay | Admitting: Cardiology

## 2023-12-10 ENCOUNTER — Other Ambulatory Visit: Payer: Self-pay | Admitting: Cardiology

## 2023-12-10 MED ORDER — PRAVASTATIN SODIUM 40 MG PO TABS: 40.0000 mg | ORAL_TABLET | ORAL | 2 refills | Status: AC

## 2023-12-10 NOTE — Telephone Encounter (Signed)
*  STAT* If patient is at the pharmacy, call can be transferred to refill team.   1. Which medications need to be refilled? (please list name of each medication and dose if known)  ALPRAZolam (XANAX) 0.5 MG tablet  2. Which pharmacy/location (including street and city if local pharmacy) is medication to be sent to? [id number] - [place], [place] - [address] AT [facility] OF [address]  3. Do they need a 30 day or 90 day supply?   90 day supply  Patient's husband states patient is completely out of medication.

## 2023-12-10 NOTE — Telephone Encounter (Signed)
Called Pt and informed her that she needs to contact PCP.

## 2023-12-10 NOTE — Telephone Encounter (Signed)
*  STAT* If patient is at the pharmacy, call can be transferred to refill team.   1. Which medications need to be refilled? (please list name of each medication and dose if known) pravastatin (PRAVACHOL) 40 MG tablet    2. Would you like to learn more about the convenience, safety, & potential cost savings by using the [facility]?     3. Are you open to using the [facility] (Type [facility].  ).   4. Which pharmacy/location (including street and city if local pharmacy) is medication to be sent to? [id number] - [place], [place] - [address] AT [facility] OF [address]    5. Do they need a 30 day or 90 day supply? 90 day Patient is out of medication

## 2023-12-10 NOTE — Telephone Encounter (Signed)
This is Dr. Ludwig Clarks pt. Does Dr. Jens Som want to refill this because it's not a heart med. I called the pt and told her to contact PCP. Please advise.

## 2023-12-10 NOTE — Telephone Encounter (Signed)
Refill will need to be by medical doctor

## 2023-12-10 NOTE — Telephone Encounter (Signed)
Pt's medication was sent to pt's pharmacy as requested. Confirmation received.  °

## 2023-12-12 DIAGNOSIS — F0284 Dementia in other diseases classified elsewhere, unspecified severity, with anxiety: Secondary | ICD-10-CM | POA: Diagnosis not present

## 2023-12-12 DIAGNOSIS — I5032 Chronic diastolic (congestive) heart failure: Secondary | ICD-10-CM | POA: Diagnosis not present

## 2023-12-12 DIAGNOSIS — I87303 Chronic venous hypertension (idiopathic) without complications of bilateral lower extremity: Secondary | ICD-10-CM | POA: Diagnosis not present

## 2023-12-12 DIAGNOSIS — I110 Hypertensive heart disease with heart failure: Secondary | ICD-10-CM | POA: Diagnosis not present

## 2023-12-12 DIAGNOSIS — I480 Paroxysmal atrial fibrillation: Secondary | ICD-10-CM | POA: Diagnosis not present

## 2023-12-12 DIAGNOSIS — G309 Alzheimer's disease, unspecified: Secondary | ICD-10-CM | POA: Diagnosis not present

## 2023-12-16 DIAGNOSIS — G309 Alzheimer's disease, unspecified: Secondary | ICD-10-CM | POA: Diagnosis not present

## 2023-12-16 DIAGNOSIS — I87303 Chronic venous hypertension (idiopathic) without complications of bilateral lower extremity: Secondary | ICD-10-CM | POA: Diagnosis not present

## 2023-12-16 DIAGNOSIS — I110 Hypertensive heart disease with heart failure: Secondary | ICD-10-CM | POA: Diagnosis not present

## 2023-12-16 DIAGNOSIS — I480 Paroxysmal atrial fibrillation: Secondary | ICD-10-CM | POA: Diagnosis not present

## 2023-12-16 DIAGNOSIS — F0284 Dementia in other diseases classified elsewhere, unspecified severity, with anxiety: Secondary | ICD-10-CM | POA: Diagnosis not present

## 2023-12-16 DIAGNOSIS — I5032 Chronic diastolic (congestive) heart failure: Secondary | ICD-10-CM | POA: Diagnosis not present

## 2023-12-19 DIAGNOSIS — G309 Alzheimer's disease, unspecified: Secondary | ICD-10-CM | POA: Diagnosis not present

## 2023-12-19 DIAGNOSIS — I110 Hypertensive heart disease with heart failure: Secondary | ICD-10-CM | POA: Diagnosis not present

## 2023-12-19 DIAGNOSIS — F0284 Dementia in other diseases classified elsewhere, unspecified severity, with anxiety: Secondary | ICD-10-CM | POA: Diagnosis not present

## 2023-12-19 DIAGNOSIS — I480 Paroxysmal atrial fibrillation: Secondary | ICD-10-CM | POA: Diagnosis not present

## 2023-12-19 DIAGNOSIS — I5032 Chronic diastolic (congestive) heart failure: Secondary | ICD-10-CM | POA: Diagnosis not present

## 2023-12-19 DIAGNOSIS — I87303 Chronic venous hypertension (idiopathic) without complications of bilateral lower extremity: Secondary | ICD-10-CM | POA: Diagnosis not present

## 2023-12-23 DIAGNOSIS — I110 Hypertensive heart disease with heart failure: Secondary | ICD-10-CM | POA: Diagnosis not present

## 2023-12-23 DIAGNOSIS — I5032 Chronic diastolic (congestive) heart failure: Secondary | ICD-10-CM | POA: Diagnosis not present

## 2023-12-23 DIAGNOSIS — I87303 Chronic venous hypertension (idiopathic) without complications of bilateral lower extremity: Secondary | ICD-10-CM | POA: Diagnosis not present

## 2023-12-23 DIAGNOSIS — I480 Paroxysmal atrial fibrillation: Secondary | ICD-10-CM | POA: Diagnosis not present

## 2023-12-23 DIAGNOSIS — G309 Alzheimer's disease, unspecified: Secondary | ICD-10-CM | POA: Diagnosis not present

## 2023-12-23 DIAGNOSIS — F0284 Dementia in other diseases classified elsewhere, unspecified severity, with anxiety: Secondary | ICD-10-CM | POA: Diagnosis not present

## 2023-12-24 DIAGNOSIS — M51369 Other intervertebral disc degeneration, lumbar region without mention of lumbar back pain or lower extremity pain: Secondary | ICD-10-CM | POA: Diagnosis not present

## 2023-12-24 DIAGNOSIS — M4856XD Collapsed vertebra, not elsewhere classified, lumbar region, subsequent encounter for fracture with routine healing: Secondary | ICD-10-CM | POA: Diagnosis not present

## 2023-12-28 DIAGNOSIS — Z7901 Long term (current) use of anticoagulants: Secondary | ICD-10-CM | POA: Diagnosis not present

## 2023-12-28 DIAGNOSIS — E440 Moderate protein-calorie malnutrition: Secondary | ICD-10-CM | POA: Diagnosis not present

## 2023-12-28 DIAGNOSIS — G629 Polyneuropathy, unspecified: Secondary | ICD-10-CM | POA: Diagnosis not present

## 2023-12-28 DIAGNOSIS — Z951 Presence of aortocoronary bypass graft: Secondary | ICD-10-CM | POA: Diagnosis not present

## 2023-12-28 DIAGNOSIS — G2581 Restless legs syndrome: Secondary | ICD-10-CM | POA: Diagnosis not present

## 2023-12-28 DIAGNOSIS — F0284 Dementia in other diseases classified elsewhere, unspecified severity, with anxiety: Secondary | ICD-10-CM | POA: Diagnosis not present

## 2023-12-28 DIAGNOSIS — G8929 Other chronic pain: Secondary | ICD-10-CM | POA: Diagnosis not present

## 2023-12-28 DIAGNOSIS — Z9181 History of falling: Secondary | ICD-10-CM | POA: Diagnosis not present

## 2023-12-28 DIAGNOSIS — M353 Polymyalgia rheumatica: Secondary | ICD-10-CM | POA: Diagnosis not present

## 2023-12-28 DIAGNOSIS — I5032 Chronic diastolic (congestive) heart failure: Secondary | ICD-10-CM | POA: Diagnosis not present

## 2023-12-28 DIAGNOSIS — G309 Alzheimer's disease, unspecified: Secondary | ICD-10-CM | POA: Diagnosis not present

## 2023-12-28 DIAGNOSIS — E039 Hypothyroidism, unspecified: Secondary | ICD-10-CM | POA: Diagnosis not present

## 2023-12-28 DIAGNOSIS — M791 Myalgia, unspecified site: Secondary | ICD-10-CM | POA: Diagnosis not present

## 2023-12-28 DIAGNOSIS — E7800 Pure hypercholesterolemia, unspecified: Secondary | ICD-10-CM | POA: Diagnosis not present

## 2023-12-28 DIAGNOSIS — K7460 Unspecified cirrhosis of liver: Secondary | ICD-10-CM | POA: Diagnosis not present

## 2023-12-28 DIAGNOSIS — I700 Atherosclerosis of aorta: Secondary | ICD-10-CM | POA: Diagnosis not present

## 2023-12-28 DIAGNOSIS — I480 Paroxysmal atrial fibrillation: Secondary | ICD-10-CM | POA: Diagnosis not present

## 2023-12-28 DIAGNOSIS — M4800 Spinal stenosis, site unspecified: Secondary | ICD-10-CM | POA: Diagnosis not present

## 2023-12-28 DIAGNOSIS — Z953 Presence of xenogenic heart valve: Secondary | ICD-10-CM | POA: Diagnosis not present

## 2023-12-28 DIAGNOSIS — I2510 Atherosclerotic heart disease of native coronary artery without angina pectoris: Secondary | ICD-10-CM | POA: Diagnosis not present

## 2023-12-28 DIAGNOSIS — M810 Age-related osteoporosis without current pathological fracture: Secondary | ICD-10-CM | POA: Diagnosis not present

## 2023-12-28 DIAGNOSIS — I110 Hypertensive heart disease with heart failure: Secondary | ICD-10-CM | POA: Diagnosis not present

## 2023-12-28 DIAGNOSIS — I87303 Chronic venous hypertension (idiopathic) without complications of bilateral lower extremity: Secondary | ICD-10-CM | POA: Diagnosis not present

## 2023-12-30 DIAGNOSIS — I480 Paroxysmal atrial fibrillation: Secondary | ICD-10-CM | POA: Diagnosis not present

## 2023-12-30 DIAGNOSIS — F0284 Dementia in other diseases classified elsewhere, unspecified severity, with anxiety: Secondary | ICD-10-CM | POA: Diagnosis not present

## 2023-12-30 DIAGNOSIS — I110 Hypertensive heart disease with heart failure: Secondary | ICD-10-CM | POA: Diagnosis not present

## 2023-12-30 DIAGNOSIS — G309 Alzheimer's disease, unspecified: Secondary | ICD-10-CM | POA: Diagnosis not present

## 2023-12-30 DIAGNOSIS — I5032 Chronic diastolic (congestive) heart failure: Secondary | ICD-10-CM | POA: Diagnosis not present

## 2023-12-30 DIAGNOSIS — I87303 Chronic venous hypertension (idiopathic) without complications of bilateral lower extremity: Secondary | ICD-10-CM | POA: Diagnosis not present

## 2024-01-06 DIAGNOSIS — I87303 Chronic venous hypertension (idiopathic) without complications of bilateral lower extremity: Secondary | ICD-10-CM | POA: Diagnosis not present

## 2024-01-06 DIAGNOSIS — I110 Hypertensive heart disease with heart failure: Secondary | ICD-10-CM | POA: Diagnosis not present

## 2024-01-06 DIAGNOSIS — F0284 Dementia in other diseases classified elsewhere, unspecified severity, with anxiety: Secondary | ICD-10-CM | POA: Diagnosis not present

## 2024-01-06 DIAGNOSIS — I5032 Chronic diastolic (congestive) heart failure: Secondary | ICD-10-CM | POA: Diagnosis not present

## 2024-01-06 DIAGNOSIS — G309 Alzheimer's disease, unspecified: Secondary | ICD-10-CM | POA: Diagnosis not present

## 2024-01-06 DIAGNOSIS — I480 Paroxysmal atrial fibrillation: Secondary | ICD-10-CM | POA: Diagnosis not present

## 2024-01-13 DIAGNOSIS — I5032 Chronic diastolic (congestive) heart failure: Secondary | ICD-10-CM | POA: Diagnosis not present

## 2024-01-13 DIAGNOSIS — F0284 Dementia in other diseases classified elsewhere, unspecified severity, with anxiety: Secondary | ICD-10-CM | POA: Diagnosis not present

## 2024-01-13 DIAGNOSIS — I87303 Chronic venous hypertension (idiopathic) without complications of bilateral lower extremity: Secondary | ICD-10-CM | POA: Diagnosis not present

## 2024-01-13 DIAGNOSIS — G309 Alzheimer's disease, unspecified: Secondary | ICD-10-CM | POA: Diagnosis not present

## 2024-01-13 DIAGNOSIS — I480 Paroxysmal atrial fibrillation: Secondary | ICD-10-CM | POA: Diagnosis not present

## 2024-01-13 DIAGNOSIS — I110 Hypertensive heart disease with heart failure: Secondary | ICD-10-CM | POA: Diagnosis not present

## 2024-01-20 DIAGNOSIS — I480 Paroxysmal atrial fibrillation: Secondary | ICD-10-CM | POA: Diagnosis not present

## 2024-01-20 DIAGNOSIS — I5032 Chronic diastolic (congestive) heart failure: Secondary | ICD-10-CM | POA: Diagnosis not present

## 2024-01-20 DIAGNOSIS — F0284 Dementia in other diseases classified elsewhere, unspecified severity, with anxiety: Secondary | ICD-10-CM | POA: Diagnosis not present

## 2024-01-20 DIAGNOSIS — G309 Alzheimer's disease, unspecified: Secondary | ICD-10-CM | POA: Diagnosis not present

## 2024-01-20 DIAGNOSIS — I87303 Chronic venous hypertension (idiopathic) without complications of bilateral lower extremity: Secondary | ICD-10-CM | POA: Diagnosis not present

## 2024-01-20 DIAGNOSIS — I110 Hypertensive heart disease with heart failure: Secondary | ICD-10-CM | POA: Diagnosis not present

## 2024-01-23 ENCOUNTER — Other Ambulatory Visit: Payer: Self-pay | Admitting: Cardiology

## 2024-01-23 DIAGNOSIS — I480 Paroxysmal atrial fibrillation: Secondary | ICD-10-CM

## 2024-01-23 NOTE — Telephone Encounter (Signed)
 Prescription refill request for Eliquis received. Indication: afib  Last office visit:[place], 09/02/2023 Scr: 1.15, 09/02/2023 Age: 89 yo  Weight: 70.7 kg   Refill sent.

## 2024-02-19 ENCOUNTER — Other Ambulatory Visit: Payer: Self-pay | Admitting: Cardiology

## 2024-02-19 DIAGNOSIS — I5032 Chronic diastolic (congestive) heart failure: Secondary | ICD-10-CM

## 2024-02-19 DIAGNOSIS — I5033 Acute on chronic diastolic (congestive) heart failure: Secondary | ICD-10-CM

## 2024-02-19 MED ORDER — TORSEMIDE 20 MG PO TABS: ORAL_TABLET | ORAL | 1 refills | Status: AC

## 2024-03-11 NOTE — Progress Notes (Deleted)
 HPI: Follow-up PAF, CAD status post coronary artery bypass graft, Bentall/AVR and maze procedure. She underwent a Bentall procedure by Dr. Nicanor Barge on 03/31/13. 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 Preoperatively she had been in and out of atrial fibrillation. Carotid Dopplers March 2017 showed 1-39% bilateral stenosis. 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  Echo May 2024 showed normal LV function, severe biatrial enlargement, severe tricuspid regurgitation. Patient noted to be in recurrent atrial fibrillation at previous office visit with worsening CHF.  Attempt at repeat cardioversion Mar 29, 2023 unsuccessful.  Since last seen,   Current Outpatient Medications  Medication Sig Dispense Refill   acetaminophen  (TYLENOL ) 500 MG tablet Take 1 tablet (500 mg total) by mouth every 6 (six) hours as needed for mild pain (pain.). (Patient taking differently: Take 500 mg by mouth at bedtime as needed for mild pain (pain score 1-3) (pain.).) 30 tablet 0   ALPRAZolam  (XANAX ) 0.5 MG tablet Take 0.5 mg by mouth at bedtime.     amiodarone  (PACERONE ) 200 MG tablet Take 1 tablet (200 mg total) by mouth daily. 90 tablet 3   apixaban  (ELIQUIS ) 5 MG TABS tablet TAKE 1 TABLET TWICE A DAY 180 tablet 1   lactase (LACTAID) 3000 UNITS tablet Take 3,000 Units by mouth daily as needed (only when eating dairy).     levothyroxine  (SYNTHROID , LEVOTHROID) 75 MCG tablet Take 75 mcg by mouth at bedtime.     Multiple Vitamin (MULTIVITAMIN WITH MINERALS) TABS tablet Take 1 tablet by mouth daily.     oxyCODONE -acetaminophen  (PERCOCET) 7.5-325 MG tablet Take 1 tablet by mouth every 4 (four) hours.     polyethylene glycol (MIRALAX  / GLYCOLAX ) 17 g packet Take 17 g by mouth in the morning.     pravastatin  (PRAVACHOL ) 40 MG tablet Take 1 tablet (40 mg total) by mouth every  other day. 45 tablet 2   rOPINIRole  (REQUIP ) 0.25 MG tablet Take 0.25 mg by mouth at bedtime.     senna (SENOKOT) 8.6 MG tablet Take 1 tablet by mouth in the morning.     torsemide  (DEMADEX ) 20 MG tablet Take 3 tablets by mouth in the morning and take 2 tablets by mouth in the afternoon daily. 450 tablet 1   No current facility-administered medications for this visit.     Past Medical History:  Diagnosis Date   Anxiety    takes xanax  for sleep.    Aortic atherosclerosis (HCC) 12/05/2021   Aortic insufficiency    a. 03/2013 s/p  AVR w/ biologic 25mm Magna Ease peric tissue valve, model 300TFX, ser# [id number].    Back pain    BACK BRACE/FRACTURE   CAD (coronary artery disease)    a. 01/2013 mod calcification w/o sev dzs;  b. 03/2013 CABG x1 VG->RCA @ time of AVR   Chronic diastolic CHF (congestive heart failure) (HCC)    a. 01/2013 echo: EF 50-55%.   Compression fracture of body of thoracic vertebra (HCC)    T-11   Dementia (HCC)    Dilated aortic root (HCC)    a. 03/2013 s/p Bentall procedure with Ao Root replacement (28mm Valsalva graft) w/ reimplantationof the R and L coronary ostium.   Dysrhythmia    A. 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 Neoadjuvant RT by Dr. Pearlene Bouchard. Surveillance plan: MRI 12/08/11 shows changes in presumed post-op seroma/hematoma with slight increase in size - follow up MRI 11/18/12 showed same size. Will continue with q3 month MRI. CT C/A/P annually alternating with CXR at 6 months.    Osteoporosis    Paroxysmal A-fib (HCC)    a. Dx 01/2013 - placed on xarelto ;  b. 03/2013 s/p left sided MAZE and LA clip @ time of AVR/CABG.   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   BLADDER SUSPENSION     CARDIOVERSION N/A 01/29/2017   Procedure: CARDIOVERSION;  Surgeon: Hugh Madura, MD;  Location: [facility] ENDOSCOPY;  Service: Cardiovascular;  Laterality: N/A;   CARDIOVERSION N/A 10/22/2017   Procedure: CARDIOVERSION;  Surgeon: [name] Quince, MD;  Location: [facility] ENDOSCOPY;  Service: Cardiovascular;  Laterality: N/A;   CARDIOVERSION N/A 03/29/2023   Procedure: CARDIOVERSION;  Surgeon: Elmyra Haggard, MD;  Location: [facility] INVASIVE CV LAB;  Service: Cardiovascular;  Laterality: N/A;   COLONOSCOPY  10/25/2011   Procedure: COLONOSCOPY;  Surgeon: Garrett Kallman, MD;  Location: [facility] ENDOSCOPY;  Service: Endoscopy;  Laterality: N/A;   EYE SURGERY Bilateral    cataracts   INTRAOPERATIVE TRANSESOPHAGEAL ECHOCARDIOGRAM N/A 03/31/2013   Procedure: INTRAOPERATIVE TRANSESOPHAGEAL ECHOCARDIOGRAM;  Surgeon: Norita Beauvais, MD;  Location: [facility] OR;  Service: Open Heart Surgery;  Laterality: N/A;   KYPHOPLASTY N/A 05/07/2018   Procedure: KYPHOPLASTY LUMBAR THREE;  Surgeon: Mort Ards, MD;  Location: [facility] OR;  Service: Orthopedics;  Laterality: N/A;   KYPHOPLASTY N/A 05/14/2019   Procedure: KYPHOPLASTY T11;  Surgeon: Mort Ards, MD;  Location: [facility] OR;  Service: Orthopedics;  Laterality: N/A;  90 mins   KYPHOPLASTY N/A 07/08/2019   Procedure: KYPHOPLASTY THORACIC TWELVE;  Surgeon: Mort Ards, MD;  Location: [facility] OR;  Service: Orthopedics;  Laterality: N/A;  60 mins   LEFT AND RIGHT HEART CATHETERIZATION WITH CORONARY ANGIOGRAM Right 02/02/2013   Procedure: LEFT AND RIGHT HEART CATHETERIZATION WITH CORONARY ANGIOGRAM;  Surgeon: Kristopher Pheasant, MD;  Location: [facility] CATH LAB;  Service: Cardiovascular;  Laterality: Right;   MAZE N/A 03/31/2013   Procedure: MAZE;  Surgeon: Norita Beauvais, MD;  Location: [facility] OR;  Service: Open Heart Surgery;  Laterality: N/A;   Sarcoma resection  2011    Social History   Socioeconomic History   Marital status: Married    Spouse name: Not on file   Number of children: Not on file   Years of education: Not on file   Highest education level: Not on file  Occupational History   Not on file  Tobacco Use   Smoking status: Never   Smokeless tobacco: Never  Vaping Use   Vaping status: Never Used  Substance and Sexual Activity   Alcohol use: No   Drug use: No   Sexual activity: Yes  Birth control/protection: Post-menopausal  Other Topics Concern   Not on file  Social History Narrative   Not on file   Social Drivers of Health   Financial Resource Strain: Not on file  Food Insecurity: Not on file  Transportation Needs: Not on file  Physical Activity: Not on file  Stress: Not on file  Social Connections: Not on file  Intimate Partner Violence: Not on file    Family History  Problem Relation Age of Onset   Heart disease Mother    Stroke Mother    Arthritis Father    Cancer Father    Anesthesia problems Brother    Hypertension Sister     ROS: no fevers or chills, productive cough, hemoptysis, dysphasia, odynophagia, melena, hematochezia, dysuria, hematuria, rash, seizure activity, orthopnea, PND, pedal edema, claudication. Remaining systems are negative.  Physical Exam: Well-developed well-nourished in no acute distress.  Skin is warm and dry.  HEENT is normal.  Neck is supple.  Chest is clear to auscultation with normal expansion.  Cardiovascular exam is regular rate and rhythm.  Abdominal exam nontender or distended. No masses palpated. Extremities show no edema. neuro grossly intact  ECG- personally reviewed  A/P  1 chronic diastolic congestive heart  failure-volume status appears to be reasonable at present.  Continue Demadex  at present dose.  We did not add an SGLT2 inhibitor as she has difficulty controlling her urine.  2 persistent atrial fibrillation-previous attempt at repeat cardioversion was unsuccessful.  Will continue amiodarone  for rate control.  Continue apixaban .  3 hypertension-blood pressure controlled.  Continue present medications.  4 hyperlipidemia-continue statin.  5 status post aortic valve replacement-continue SBE prophylaxis.  6 coronary artery disease-she is not having chest pain.  Continue statin.  7 tricuspid regurgitation-conservative measures given age and overall frailty.  Alexandria Angel, MD

## 2024-03-12 ENCOUNTER — Ambulatory Visit: Payer: Medicare Other | Admitting: Podiatry

## 2024-03-16 ENCOUNTER — Telehealth: Payer: Self-pay | Admitting: Cardiology

## 2024-03-16 NOTE — Telephone Encounter (Signed)
 Husband called in cancelling patient's appointment stating he does not believe she will be able to come for another visit due to her current state. He is requesting a callback from [place] regarding a few questions. Please advise.

## 2024-03-16 NOTE — Telephone Encounter (Signed)
 Spoke with pt husband, he reports the patient is not able to walk at all now. They have been able to get her up to sit in the chair. He reports she is alert at times and carries on a conversation but also is sleeping a lot. He has cancelled her appointment for next week. He has an appointment next week and will talk to dr Audery Blazing at that time as to what going forward would be best. They do have palliative care and are not interested in hospice at this point. Aware will let dr Audery Blazing know the situation and husband knows to call if anything is needed.

## 2024-03-24 ENCOUNTER — Ambulatory Visit: Payer: Medicare Other | Admitting: Cardiology

## 2024-04-20 DIAGNOSIS — K5903 Drug induced constipation: Secondary | ICD-10-CM | POA: Diagnosis not present

## 2024-04-20 DIAGNOSIS — M4856XD Collapsed vertebra, not elsewhere classified, lumbar region, subsequent encounter for fracture with routine healing: Secondary | ICD-10-CM | POA: Diagnosis not present

## 2024-04-20 DIAGNOSIS — S32019A Unspecified fracture of first lumbar vertebra, initial encounter for closed fracture: Secondary | ICD-10-CM | POA: Diagnosis not present

## 2024-04-20 DIAGNOSIS — M51369 Other intervertebral disc degeneration, lumbar region without mention of lumbar back pain or lower extremity pain: Secondary | ICD-10-CM | POA: Diagnosis not present

## 2024-05-11 DIAGNOSIS — J159 Unspecified bacterial pneumonia: Secondary | ICD-10-CM | POA: Diagnosis not present

## 2024-05-11 DIAGNOSIS — I5032 Chronic diastolic (congestive) heart failure: Secondary | ICD-10-CM | POA: Diagnosis not present

## 2024-05-11 DIAGNOSIS — I480 Paroxysmal atrial fibrillation: Secondary | ICD-10-CM | POA: Diagnosis not present

## 2024-05-11 DIAGNOSIS — E860 Dehydration: Secondary | ICD-10-CM | POA: Diagnosis not present

## 2024-05-15 DIAGNOSIS — I480 Paroxysmal atrial fibrillation: Secondary | ICD-10-CM | POA: Diagnosis not present

## 2024-05-15 DIAGNOSIS — I5032 Chronic diastolic (congestive) heart failure: Secondary | ICD-10-CM | POA: Diagnosis not present

## 2024-05-15 DIAGNOSIS — R600 Localized edema: Secondary | ICD-10-CM | POA: Diagnosis not present

## 2024-05-15 DIAGNOSIS — J159 Unspecified bacterial pneumonia: Secondary | ICD-10-CM | POA: Diagnosis not present

## 2024-05-19 ENCOUNTER — Telehealth: Payer: Self-pay | Admitting: Cardiology

## 2024-05-19 NOTE — Telephone Encounter (Signed)
 Jon Reil called in and state that pt has been off the Torsemide  since 6/23.  She stated she seems to be doing fine without it. She just wanted to let Dr Pietro know and make him aware that she is no long taking this med.    Best number [phone] Jon

## 2024-05-20 NOTE — Telephone Encounter (Signed)
 Spoke with pt daughter, they will continue to monitor and use the torsemide  as needed.

## 2024-05-23 DIAGNOSIS — I469 Cardiac arrest, cause unspecified: Secondary | ICD-10-CM | POA: Diagnosis not present

## 2024-05-23 DIAGNOSIS — R55 Syncope and collapse: Secondary | ICD-10-CM | POA: Diagnosis not present

## 2024-05-23 DIAGNOSIS — R404 Transient alteration of awareness: Secondary | ICD-10-CM | POA: Diagnosis not present

## 2024-06-19 DIAGNOSIS — 419620001 Death: Secondary | SNOMED CT | POA: Diagnosis not present

## 2024-06-19 DEATH — deceased
